# Patient Record
Sex: Female | Born: 1965 | Race: White | Hispanic: No | Smoking: Current every day smoker
Health system: Southern US, Community
[De-identification: ages and names within clinical notes are randomized; demographics above are authoritative.]

## PROBLEM LIST (undated history)

## (undated) ENCOUNTER — Emergency Department (HOSPITAL_COMMUNITY): Disposition: A | Discharge status: Home / Self Care | Payer: No Typology Code available for payment source

## (undated) DIAGNOSIS — K219 Gastro-esophageal reflux disease without esophagitis: Secondary | ICD-10-CM

## (undated) DIAGNOSIS — M549 Dorsalgia, unspecified: Secondary | ICD-10-CM

## (undated) DIAGNOSIS — F32A Depression, unspecified: Secondary | ICD-10-CM

## (undated) DIAGNOSIS — F200 Paranoid schizophrenia: Secondary | ICD-10-CM

## (undated) DIAGNOSIS — F1910 Other psychoactive substance abuse, uncomplicated: Secondary | ICD-10-CM

## (undated) DIAGNOSIS — R011 Cardiac murmur, unspecified: Secondary | ICD-10-CM

## (undated) DIAGNOSIS — N809 Endometriosis, unspecified: Secondary | ICD-10-CM

## (undated) DIAGNOSIS — F419 Anxiety disorder, unspecified: Secondary | ICD-10-CM

## (undated) DIAGNOSIS — F329 Major depressive disorder, single episode, unspecified: Secondary | ICD-10-CM

## (undated) DIAGNOSIS — J45909 Unspecified asthma, uncomplicated: Secondary | ICD-10-CM

## (undated) DIAGNOSIS — F1123 Opioid dependence with withdrawal: Secondary | ICD-10-CM

## (undated) HISTORY — DX: Opioid dependence with withdrawal: F11.23

## (undated) HISTORY — DX: Gastro-esophageal reflux disease without esophagitis: K21.9

## (undated) HISTORY — DX: Cardiac murmur, unspecified: R01.1

## (undated) HISTORY — PX: FOOT SURGERY: SHX648

## (undated) HISTORY — DX: Depression, unspecified: F32.A

## (undated) HISTORY — DX: Other psychoactive substance abuse, uncomplicated: F19.10

## (undated) HISTORY — DX: Major depressive disorder, single episode, unspecified: F32.9

## (undated) HISTORY — DX: Anxiety disorder, unspecified: F41.9

## (undated) SURGERY — Surgical Case
Anesthesia: *Unknown

---

## 1997-07-22 ENCOUNTER — Encounter: Admission: RE | Admit: 1997-07-22 | Discharge: 1997-07-22 | Payer: Self-pay | Admitting: Family Medicine

## 1997-10-23 ENCOUNTER — Emergency Department (HOSPITAL_COMMUNITY): Admission: EM | Admit: 1997-10-23 | Discharge: 1997-10-23 | Payer: Self-pay | Admitting: Emergency Medicine

## 1997-12-05 ENCOUNTER — Emergency Department (HOSPITAL_COMMUNITY): Admission: EM | Admit: 1997-12-05 | Discharge: 1997-12-05 | Payer: Self-pay | Admitting: Emergency Medicine

## 1997-12-15 ENCOUNTER — Inpatient Hospital Stay (HOSPITAL_COMMUNITY): Admission: AD | Admit: 1997-12-15 | Discharge: 1997-12-15 | Payer: Self-pay | Admitting: Obstetrics

## 1998-01-11 ENCOUNTER — Encounter: Admission: RE | Admit: 1998-01-11 | Discharge: 1998-01-11 | Payer: Self-pay | Admitting: Family Medicine

## 1998-01-12 ENCOUNTER — Ambulatory Visit (HOSPITAL_COMMUNITY): Admission: RE | Admit: 1998-01-12 | Discharge: 1998-01-12 | Payer: Self-pay | Admitting: *Deleted

## 1998-03-28 ENCOUNTER — Encounter: Admission: RE | Admit: 1998-03-28 | Discharge: 1998-03-28 | Payer: Self-pay | Admitting: Family Medicine

## 1998-04-02 HISTORY — PX: TUBAL LIGATION: SHX77

## 1998-04-07 ENCOUNTER — Other Ambulatory Visit: Admission: RE | Admit: 1998-04-07 | Discharge: 1998-04-07 | Payer: Self-pay | Admitting: *Deleted

## 1998-04-07 ENCOUNTER — Encounter: Admission: RE | Admit: 1998-04-07 | Discharge: 1998-04-07 | Payer: Self-pay | Admitting: Sports Medicine

## 1998-04-12 ENCOUNTER — Ambulatory Visit (HOSPITAL_COMMUNITY): Admission: RE | Admit: 1998-04-12 | Discharge: 1998-04-12 | Payer: Self-pay

## 1998-04-22 ENCOUNTER — Encounter: Admission: RE | Admit: 1998-04-22 | Discharge: 1998-04-22 | Payer: Self-pay | Admitting: Family Medicine

## 1998-05-10 ENCOUNTER — Encounter: Admission: RE | Admit: 1998-05-10 | Discharge: 1998-05-10 | Payer: Self-pay | Admitting: Sports Medicine

## 1998-06-16 ENCOUNTER — Inpatient Hospital Stay (HOSPITAL_COMMUNITY): Admission: EM | Admit: 1998-06-16 | Discharge: 1998-06-21 | Payer: Self-pay | Admitting: Emergency Medicine

## 1998-06-21 ENCOUNTER — Inpatient Hospital Stay (HOSPITAL_COMMUNITY): Admission: AD | Admit: 1998-06-21 | Discharge: 1998-06-29 | Payer: Self-pay | Admitting: *Deleted

## 1998-06-21 ENCOUNTER — Encounter: Payer: Self-pay | Admitting: *Deleted

## 1999-01-16 ENCOUNTER — Emergency Department (HOSPITAL_COMMUNITY): Admission: EM | Admit: 1999-01-16 | Discharge: 1999-01-16 | Payer: Self-pay | Admitting: Emergency Medicine

## 1999-07-08 ENCOUNTER — Emergency Department (HOSPITAL_COMMUNITY): Admission: EM | Admit: 1999-07-08 | Discharge: 1999-07-08 | Payer: Self-pay | Admitting: Emergency Medicine

## 1999-07-10 ENCOUNTER — Emergency Department (HOSPITAL_COMMUNITY): Admission: EM | Admit: 1999-07-10 | Discharge: 1999-07-10 | Payer: Self-pay | Admitting: Emergency Medicine

## 2001-01-08 ENCOUNTER — Emergency Department (HOSPITAL_COMMUNITY): Admission: EM | Admit: 2001-01-08 | Discharge: 2001-01-08 | Payer: Self-pay | Admitting: Emergency Medicine

## 2001-03-22 ENCOUNTER — Encounter: Payer: Self-pay | Admitting: Emergency Medicine

## 2001-03-22 ENCOUNTER — Emergency Department (HOSPITAL_COMMUNITY): Admission: EM | Admit: 2001-03-22 | Discharge: 2001-03-22 | Payer: Self-pay | Admitting: Emergency Medicine

## 2002-09-03 ENCOUNTER — Emergency Department (HOSPITAL_COMMUNITY): Admission: EM | Admit: 2002-09-03 | Discharge: 2002-09-03 | Payer: Self-pay | Admitting: *Deleted

## 2002-10-27 ENCOUNTER — Emergency Department (HOSPITAL_COMMUNITY): Admission: EM | Admit: 2002-10-27 | Discharge: 2002-10-27 | Payer: Self-pay | Admitting: Emergency Medicine

## 2002-10-28 ENCOUNTER — Emergency Department (HOSPITAL_COMMUNITY): Admission: EM | Admit: 2002-10-28 | Discharge: 2002-10-28 | Payer: Self-pay

## 2002-10-30 ENCOUNTER — Emergency Department (HOSPITAL_COMMUNITY): Admission: EM | Admit: 2002-10-30 | Discharge: 2002-10-30 | Payer: Self-pay | Admitting: Emergency Medicine

## 2002-11-14 ENCOUNTER — Emergency Department (HOSPITAL_COMMUNITY): Admission: EM | Admit: 2002-11-14 | Discharge: 2002-11-14 | Payer: Self-pay | Admitting: Emergency Medicine

## 2003-08-28 ENCOUNTER — Emergency Department (HOSPITAL_COMMUNITY): Admission: EM | Admit: 2003-08-28 | Discharge: 2003-08-28 | Payer: Self-pay | Admitting: Emergency Medicine

## 2003-11-03 ENCOUNTER — Emergency Department (HOSPITAL_COMMUNITY): Admission: EM | Admit: 2003-11-03 | Discharge: 2003-11-03 | Payer: Self-pay | Admitting: Emergency Medicine

## 2003-11-07 ENCOUNTER — Emergency Department (HOSPITAL_COMMUNITY): Admission: EM | Admit: 2003-11-07 | Discharge: 2003-11-07 | Payer: Self-pay | Admitting: Emergency Medicine

## 2004-10-24 ENCOUNTER — Ambulatory Visit: Payer: Self-pay | Admitting: Internal Medicine

## 2004-10-27 ENCOUNTER — Ambulatory Visit: Payer: Self-pay | Admitting: *Deleted

## 2005-02-20 ENCOUNTER — Emergency Department (HOSPITAL_COMMUNITY): Admission: EM | Admit: 2005-02-20 | Discharge: 2005-02-20 | Payer: Self-pay | Admitting: Emergency Medicine
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
## 2006-03-06 ENCOUNTER — Emergency Department (HOSPITAL_COMMUNITY): Admission: EM | Admit: 2006-03-06 | Discharge: 2006-03-06 | Payer: Self-pay | Admitting: Emergency Medicine
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
## 2008-03-04 ENCOUNTER — Emergency Department (HOSPITAL_COMMUNITY): Admission: EM | Admit: 2008-03-04 | Discharge: 2008-03-04 | Payer: Self-pay | Admitting: Emergency Medicine

## 2009-02-22 ENCOUNTER — Inpatient Hospital Stay (HOSPITAL_COMMUNITY): Admission: EM | Admit: 2009-02-22 | Discharge: 2009-02-23 | Payer: Self-pay | Admitting: Emergency Medicine
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
## 2010-01-03 ENCOUNTER — Emergency Department (HOSPITAL_COMMUNITY): Admission: EM | Admit: 2010-01-03 | Discharge: 2010-01-03 | Payer: Self-pay | Admitting: Emergency Medicine

## 2010-06-21 ENCOUNTER — Emergency Department (HOSPITAL_COMMUNITY)
Admission: EM | Admit: 2010-06-21 | Discharge: 2010-06-21 | Disposition: A | Discharge status: Home / Self Care | Payer: Self-pay | Attending: Emergency Medicine | Admitting: Emergency Medicine

## 2010-06-21 DIAGNOSIS — K029 Dental caries, unspecified: Secondary | ICD-10-CM | POA: Insufficient documentation

## 2010-06-21 DIAGNOSIS — K089 Disorder of teeth and supporting structures, unspecified: Secondary | ICD-10-CM | POA: Insufficient documentation

## 2010-07-05 LAB — COMPREHENSIVE METABOLIC PANEL
Albumin: 2.9 g/dL — ABNORMAL LOW (ref 3.5–5.2)
Alkaline Phosphatase: 56 U/L (ref 39–117)
BUN: 3 mg/dL — ABNORMAL LOW (ref 6–23)
BUN: 7 mg/dL (ref 6–23)
CO2: 19 mEq/L (ref 19–32)
Chloride: 110 mEq/L (ref 96–112)
Creatinine, Ser: 0.69 mg/dL (ref 0.4–1.2)
Creatinine, Ser: 0.69 mg/dL (ref 0.4–1.2)
GFR calc non Af Amer: >60 mL/min (ref >60)
GFR calc non Af Amer: >60 mL/min (ref >60)
Glucose, Bld: 86 mg/dL (ref 70–99)
Glucose, Bld: 91 mg/dL (ref 70–99)
Potassium: 3.3 mEq/L — ABNORMAL LOW (ref 3.5–5.1)
Sodium: 137 mEq/L (ref 135–145)
Sodium: 142 mEq/L (ref 135–145)
Total Protein: 5.5 g/dL — ABNORMAL LOW (ref 6.0–8.3)
Total Protein: 6.2 g/dL (ref 6.0–8.3)

## 2010-07-05 LAB — URINALYSIS, ROUTINE W REFLEX MICROSCOPIC
Bilirubin Urine: NEGATIVE
Ketones, ur: NEGATIVE mg/dL
Nitrite: NEGATIVE
Protein, ur: NEGATIVE mg/dL
Specific Gravity, Urine: 1.012 (ref 1.005–1.030)

## 2010-07-05 LAB — URINE MICROSCOPIC-ADD ON

## 2010-07-05 LAB — DIFFERENTIAL
Eosinophils Absolute: 0 10*3/uL (ref 0.0–0.7)
Eosinophils Relative: 0 % (ref 0–5)
Lymphocytes Relative: 27 % (ref 12–46)
Monocytes Absolute: 0.4 10*3/uL (ref 0.1–1.0)
Monocytes Relative: 5 % (ref 3–12)
Neutro Abs: 4.7 10*3/uL (ref 1.7–7.7)

## 2010-07-05 LAB — CALCIUM: Calcium: 7.6 mg/dL — ABNORMAL LOW (ref 8.4–10.5)

## 2010-07-05 LAB — MAGNESIUM
Magnesium: 1.8 mg/dL (ref 1.5–2.5)
Magnesium: 1.8 mg/dL (ref 1.5–2.5)

## 2010-07-05 LAB — RAPID URINE DRUG SCREEN, HOSP PERFORMED
Benzodiazepines: NOT DETECTED
Cocaine: POSITIVE — AB
Tetrahydrocannabinol: NOT DETECTED

## 2010-07-05 LAB — TRICYCLICS SCREEN, URINE: TCA Scrn: POSITIVE — AB

## 2010-07-05 LAB — CBC
HCT: 32.5 % — ABNORMAL LOW (ref 36.0–46.0)
MCHC: 34 g/dL (ref 30.0–36.0)
MCV: 85 fL (ref 78.0–100.0)
MCV: 85.3 fL (ref 78.0–100.0)
Platelets: 186 10*3/uL (ref 150–400)
RBC: 3.82 MIL/uL — ABNORMAL LOW (ref 3.87–5.11)
RDW: 12.8 % (ref 11.5–15.5)
RDW: 13.1 % (ref 11.5–15.5)
WBC: 7 10*3/uL (ref 4.0–10.5)

## 2010-07-05 LAB — ETHANOL: Alcohol, Ethyl (B): 158 mg/dL — ABNORMAL HIGH (ref 0–10)

## 2010-07-05 LAB — SALICYLATE LEVEL: Salicylate Lvl: <4 mg/dL (ref 2.8–20.0)

## 2010-07-05 LAB — PHOSPHORUS: Phosphorus: 2.2 mg/dL — ABNORMAL LOW (ref 2.3–4.6)

## 2010-07-05 LAB — ACETAMINOPHEN LEVEL: Acetaminophen (Tylenol), Serum: <10 ug/mL — ABNORMAL LOW (ref 10–30)

## 2010-10-03 ENCOUNTER — Other Ambulatory Visit (HOSPITAL_COMMUNITY)
Admission: RE | Admit: 2010-10-03 | Discharge: 2010-10-03 | Disposition: A | Discharge status: Home / Self Care | Payer: BC Managed Care – PPO | Source: Ambulatory Visit | Attending: Family Medicine | Admitting: Family Medicine

## 2010-10-03 ENCOUNTER — Ambulatory Visit: Payer: Self-pay | Admitting: Family Medicine

## 2010-10-03 ENCOUNTER — Ambulatory Visit (INDEPENDENT_AMBULATORY_CARE_PROVIDER_SITE_OTHER): Payer: Self-pay | Admitting: Family Medicine

## 2010-10-03 ENCOUNTER — Telehealth: Payer: Self-pay | Admitting: Family Medicine

## 2010-10-03 DIAGNOSIS — F329 Major depressive disorder, single episode, unspecified: Secondary | ICD-10-CM

## 2010-10-03 DIAGNOSIS — F1910 Other psychoactive substance abuse, uncomplicated: Secondary | ICD-10-CM

## 2010-10-03 DIAGNOSIS — Z0000 Encounter for general adult medical examination without abnormal findings: Secondary | ICD-10-CM

## 2010-10-03 DIAGNOSIS — Z01419 Encounter for gynecological examination (general) (routine) without abnormal findings: Secondary | ICD-10-CM | POA: Insufficient documentation

## 2010-10-03 DIAGNOSIS — G4700 Insomnia, unspecified: Secondary | ICD-10-CM

## 2010-10-03 DIAGNOSIS — Z205 Contact with and (suspected) exposure to viral hepatitis: Secondary | ICD-10-CM

## 2010-10-03 DIAGNOSIS — B1920 Unspecified viral hepatitis C without hepatic coma: Secondary | ICD-10-CM

## 2010-10-03 DIAGNOSIS — N760 Acute vaginitis: Secondary | ICD-10-CM

## 2010-10-03 DIAGNOSIS — R5381 Other malaise: Secondary | ICD-10-CM

## 2010-10-03 DIAGNOSIS — F32A Depression, unspecified: Secondary | ICD-10-CM

## 2010-10-03 DIAGNOSIS — Z202 Contact with and (suspected) exposure to infections with a predominantly sexual mode of transmission: Secondary | ICD-10-CM

## 2010-10-03 DIAGNOSIS — Z124 Encounter for screening for malignant neoplasm of cervix: Secondary | ICD-10-CM

## 2010-10-03 DIAGNOSIS — R5383 Other fatigue: Secondary | ICD-10-CM

## 2010-10-03 DIAGNOSIS — Z20828 Contact with and (suspected) exposure to other viral communicable diseases: Secondary | ICD-10-CM

## 2010-10-03 DIAGNOSIS — F1990 Other psychoactive substance use, unspecified, uncomplicated: Secondary | ICD-10-CM

## 2010-10-03 MED ORDER — OMEPRAZOLE 40 MG PO CPDR: 40.0000 mg | DELAYED_RELEASE_CAPSULE | Freq: Every day | ORAL | Status: DC

## 2010-10-03 MED ORDER — ZOLPIDEM TARTRATE ER 6.25 MG PO TBCR: 6.2500 mg | EXTENDED_RELEASE_TABLET | Freq: Every evening | ORAL | Status: DC | PRN

## 2010-10-03 MED ORDER — SERTRALINE HCL 50 MG PO TABS: 50.0000 mg | ORAL_TABLET | Freq: Every day | ORAL | Status: DC

## 2010-10-03 NOTE — Telephone Encounter (Signed)
Pt states that she cannot afford the ambien.  Not even the generic.  Wants to know if there is something cheaper. [facility] [address]

## 2010-10-03 NOTE — Patient Instructions (Signed)
We are going to check you for thyroid problems, anemia, Hepatitis, and the major sexually transmitted diseases.  It will take several days to come back and we'll call or send a letter depending on what it shows. In the meantime, I will prescribe Omeprazole as an anti-acid medicine.  It is stronger than Tums and can help with the nausea, burning, and indigestion.  It takes several days to kick in. I will do 2 weeks of Ambien as well as the mood medicine to stabilize things.  This should help some with sleep.  We'll also see what your lab tests show as far as sleep. Come back and see me in 4-6 weeks.

## 2010-10-04 LAB — TSH: TSH: 1.588 u[IU]/mL (ref 0.350–4.500)

## 2010-10-04 LAB — CBC
Hemoglobin: 14.1 g/dL (ref 12.0–15.0)
MCHC: 33.7 g/dL (ref 30.0–36.0)
WBC: 7 10*3/uL (ref 4.0–10.5)

## 2010-10-04 LAB — COMPREHENSIVE METABOLIC PANEL
AST: 14 U/L (ref 0–37)
Albumin: 4.4 g/dL (ref 3.5–5.2)
BUN: 4 mg/dL — ABNORMAL LOW (ref 6–23)
CO2: 24 mEq/L (ref 19–32)
Calcium: 10 mg/dL (ref 8.4–10.5)
Chloride: 102 mEq/L (ref 96–112)
Glucose, Bld: 81 mg/dL (ref 70–99)
Potassium: 3.8 mEq/L (ref 3.5–5.3)

## 2010-10-04 LAB — GC/CHLAMYDIA PROBE AMP, GENITAL
Chlamydia, DNA Probe: NEGATIVE
GC Probe Amp, Genital: NEGATIVE

## 2010-10-04 LAB — LIPID PANEL
LDL Cholesterol: 135 mg/dL — ABNORMAL HIGH (ref 0–99)
Triglycerides: 152 mg/dL — ABNORMAL HIGH (ref <150)

## 2010-10-04 LAB — HIV ANTIBODY (ROUTINE TESTING W REFLEX): HIV: NONREACTIVE

## 2010-10-05 ENCOUNTER — Telehealth: Payer: Self-pay | Admitting: Family Medicine

## 2010-10-05 LAB — HEPATITIS PANEL, ACUTE: Hep A IgM: NEGATIVE

## 2010-10-05 NOTE — Telephone Encounter (Signed)
Pt was prescribed ambien CR, says it cost her too much and wants to know if MD can lower the dosage so she can get it cheaper?

## 2010-10-05 NOTE — Telephone Encounter (Signed)
Pt called back, says her husband is going to just pay for the rx for her.

## 2010-10-06 ENCOUNTER — Encounter: Payer: Self-pay | Admitting: Family Medicine

## 2010-10-06 DIAGNOSIS — G4700 Insomnia, unspecified: Secondary | ICD-10-CM | POA: Insufficient documentation

## 2010-10-06 DIAGNOSIS — Z205 Contact with and (suspected) exposure to viral hepatitis: Secondary | ICD-10-CM | POA: Insufficient documentation

## 2010-10-06 DIAGNOSIS — F32A Depression, unspecified: Secondary | ICD-10-CM | POA: Insufficient documentation

## 2010-10-06 DIAGNOSIS — F329 Major depressive disorder, single episode, unspecified: Secondary | ICD-10-CM | POA: Insufficient documentation

## 2010-10-06 MED ORDER — DOXEPIN HCL 6 MG PO TABS: ORAL_TABLET | ORAL | Status: DC

## 2010-10-06 NOTE — Assessment & Plan Note (Signed)
Plan to start her on Sertraline as this has helped her in past.   May need increase in future.   Fu in 3 weeks

## 2010-10-06 NOTE — Telephone Encounter (Signed)
I sent in for Doxepin for her, this is a much cheaper sleep medication.
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
## 2010-10-06 NOTE — Telephone Encounter (Signed)
Informed patient

## 2010-10-06 NOTE — Progress Notes (Signed)
Addended by: Sycamore Cellar on: 10/06/2010 03:04 PM   Modules accepted: Orders

## 2010-10-06 NOTE — Assessment & Plan Note (Signed)
Will recheck today.   If positive, recommend her to be seen at Hep C clinic.

## 2010-10-06 NOTE — Assessment & Plan Note (Addendum)
Believe that Sertraline will help with this as anxiety has been a large component.   Will prescribe 2 weeks of Ambien.    ** Addendum:  Ambien too expensive, will attempt Doxepin.

## 2010-10-14 ENCOUNTER — Other Ambulatory Visit: Payer: Self-pay | Admitting: Family Medicine

## 2010-10-14 DIAGNOSIS — G4700 Insomnia, unspecified: Secondary | ICD-10-CM

## 2010-10-18 ENCOUNTER — Telehealth: Payer: Self-pay | Admitting: Family Medicine

## 2010-10-18 ENCOUNTER — Emergency Department (HOSPITAL_COMMUNITY)
Admission: EM | Admit: 2010-10-18 | Discharge: 2010-10-18 | Disposition: A | Discharge status: Home / Self Care | Payer: BC Managed Care – PPO | Attending: Emergency Medicine | Admitting: Emergency Medicine

## 2010-10-18 DIAGNOSIS — K029 Dental caries, unspecified: Secondary | ICD-10-CM | POA: Insufficient documentation

## 2010-10-18 DIAGNOSIS — K089 Disorder of teeth and supporting structures, unspecified: Secondary | ICD-10-CM | POA: Insufficient documentation

## 2010-10-18 NOTE — Telephone Encounter (Signed)
Spoke with patient and she states she has found out that her insurance will pay for Ambien. She has called the insurance company and found out  she first had to pay a deductable of $55.00 but then all the RX will be $5.00. States the doxepin does not help . She wakes up several times during night and the next day she feels tired. Will send message to MD. She needs RX before Friday.    States she just picked up Rx for doxepin on Monday.

## 2010-10-18 NOTE — Telephone Encounter (Signed)
Pt is having a hard time with getting her Remus Loffler  - needs to talk to nurse

## 2010-10-19 ENCOUNTER — Other Ambulatory Visit: Payer: Self-pay | Admitting: Family Medicine

## 2010-10-19 DIAGNOSIS — R768 Other specified abnormal immunological findings in serum: Secondary | ICD-10-CM

## 2010-10-19 NOTE — Telephone Encounter (Signed)
Faxed Prescription for Ambien this morning.

## 2010-10-19 NOTE — Telephone Encounter (Signed)
Patient notified

## 2010-11-01 ENCOUNTER — Ambulatory Visit (INDEPENDENT_AMBULATORY_CARE_PROVIDER_SITE_OTHER): Payer: BC Managed Care – PPO | Admitting: Family Medicine

## 2010-11-01 ENCOUNTER — Ambulatory Visit (HOSPITAL_COMMUNITY)
Admission: RE | Admit: 2010-11-01 | Discharge: 2010-11-01 | Disposition: A | Discharge status: Home / Self Care | Payer: BC Managed Care – PPO | Source: Ambulatory Visit | Attending: Family Medicine | Admitting: Family Medicine

## 2010-11-01 ENCOUNTER — Encounter: Payer: Self-pay | Admitting: Family Medicine

## 2010-11-01 DIAGNOSIS — G8911 Acute pain due to trauma: Secondary | ICD-10-CM

## 2010-11-01 DIAGNOSIS — M545 Low back pain, unspecified: Secondary | ICD-10-CM | POA: Insufficient documentation

## 2010-11-01 DIAGNOSIS — IMO0001 Reserved for inherently not codable concepts without codable children: Secondary | ICD-10-CM | POA: Insufficient documentation
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
## 2010-11-01 MED ORDER — HYDROCODONE-ACETAMINOPHEN 5-500 MG PO TABS: 1.0000 | ORAL_TABLET | Freq: Four times a day (QID) | ORAL | Status: AC | PRN
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
## 2010-11-03 ENCOUNTER — Telehealth: Payer: Self-pay | Admitting: Family Medicine

## 2010-11-03 NOTE — Telephone Encounter (Signed)
[facility] has sent refill request for Zolpidem and Doxepin.  Just wanted to let you know to watch for them.  She said one of the meds had a refill, but it was too soon so they need authorization.

## 2010-11-03 NOTE — Telephone Encounter (Signed)
Will refill once I have the paperwork.
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
## 2010-11-04 NOTE — Assessment & Plan Note (Signed)
Plan to treat patient's pain.   Obtain lumbar films for documentation purposes and to assure no fractures. Possible fracture of coccyx:  Provided pain meds and recommended doughnut cushion. Ensured again that patient has safe place to be and safety plan in place.  Patient reiterates that this is the case.   To FU in 1-2 weeks or sooner to make sure she's still safe and doing well.    ** Will need to upload photos to record.

## 2010-11-19 ENCOUNTER — Emergency Department (HOSPITAL_COMMUNITY)
Admission: EM | Admit: 2010-11-19 | Discharge: 2010-11-19 | Disposition: A | Discharge status: Home / Self Care | Payer: BC Managed Care – PPO | Attending: Emergency Medicine | Admitting: Emergency Medicine

## 2010-11-19 DIAGNOSIS — Z79899 Other long term (current) drug therapy: Secondary | ICD-10-CM | POA: Insufficient documentation

## 2010-11-19 DIAGNOSIS — N946 Dysmenorrhea, unspecified: Secondary | ICD-10-CM | POA: Insufficient documentation

## 2010-11-19 DIAGNOSIS — K029 Dental caries, unspecified: Secondary | ICD-10-CM | POA: Insufficient documentation

## 2010-11-19 DIAGNOSIS — K089 Disorder of teeth and supporting structures, unspecified: Secondary | ICD-10-CM | POA: Insufficient documentation

## 2010-11-19 LAB — URINALYSIS, ROUTINE W REFLEX MICROSCOPIC
Leukocytes, UA: NEGATIVE
Nitrite: NEGATIVE
Specific Gravity, Urine: 1.011 (ref 1.005–1.030)
pH: 6.5 (ref 5.0–8.0)

## 2010-11-19 LAB — URINE MICROSCOPIC-ADD ON
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
## 2011-01-18 ENCOUNTER — Ambulatory Visit: Payer: BC Managed Care – PPO | Admitting: Gastroenterology

## 2011-01-19 ENCOUNTER — Ambulatory Visit: Payer: BC Managed Care – PPO | Admitting: Gastroenterology

## 2011-01-19 ENCOUNTER — Telehealth: Payer: Self-pay | Admitting: Family Medicine

## 2011-01-19 NOTE — Telephone Encounter (Signed)
Need to have pictures taken of patient returned to her to be used in court on 10/31st.  Please call her back if there is a problem with t his request

## 2011-01-23 NOTE — Telephone Encounter (Signed)
These pictures are included in her patient visit on the computer in Epic. Do we know how to get her these pictures?  There are no hard copies, they were taken on a digital camera.

## 2011-01-30 ENCOUNTER — Telehealth: Payer: Self-pay | Admitting: Family Medicine

## 2011-01-30 NOTE — Telephone Encounter (Signed)
States that the pharmacy doesn't have the 6.25 Ambien and needs something to help her sleep - they told her they have a higher dose but not the 6.25 [facility] [address]

## 2011-01-31 MED ORDER — ZOLPIDEM TARTRATE 10 MG PO TABS: 10.0000 mg | ORAL_TABLET | Freq: Every evening | ORAL | Status: DC | PRN

## 2011-01-31 NOTE — Telephone Encounter (Signed)
Forwarded to pcp.Isahia Hollerbach Lynetta  

## 2011-02-01 ENCOUNTER — Ambulatory Visit: Payer: BC Managed Care – PPO | Admitting: Family Medicine

## 2011-02-04 ENCOUNTER — Encounter (HOSPITAL_COMMUNITY): Payer: Self-pay | Admitting: Emergency Medicine

## 2011-02-04 ENCOUNTER — Emergency Department (HOSPITAL_COMMUNITY)
Admission: EM | Admit: 2011-02-04 | Discharge: 2011-02-04 | Disposition: A | Discharge status: Home / Self Care | Payer: BC Managed Care – PPO | Attending: Emergency Medicine | Admitting: Emergency Medicine

## 2011-02-04 ENCOUNTER — Emergency Department (HOSPITAL_COMMUNITY): Payer: BC Managed Care – PPO

## 2011-02-04 DIAGNOSIS — J329 Chronic sinusitis, unspecified: Secondary | ICD-10-CM | POA: Insufficient documentation

## 2011-02-04 DIAGNOSIS — J069 Acute upper respiratory infection, unspecified: Secondary | ICD-10-CM | POA: Insufficient documentation

## 2011-02-04 DIAGNOSIS — R059 Cough, unspecified: Secondary | ICD-10-CM | POA: Insufficient documentation

## 2011-02-04 DIAGNOSIS — R05 Cough: Secondary | ICD-10-CM | POA: Insufficient documentation

## 2011-02-04 DIAGNOSIS — R51 Headache: Secondary | ICD-10-CM | POA: Insufficient documentation

## 2011-02-04 DIAGNOSIS — R070 Pain in throat: Secondary | ICD-10-CM | POA: Insufficient documentation

## 2011-02-04 LAB — BASIC METABOLIC PANEL
CO2: 26 mEq/L (ref 19–32)
Calcium: 9.7 mg/dL (ref 8.4–10.5)
GFR calc non Af Amer: >90 mL/min (ref >90)
Sodium: 137 mEq/L (ref 135–145)

## 2011-02-04 LAB — DIFFERENTIAL
Basophils Absolute: 0 10*3/uL (ref 0.0–0.1)
Eosinophils Relative: 1 % (ref 0–5)
Lymphocytes Relative: 29 % (ref 12–46)

## 2011-02-04 LAB — CBC
MCV: 87.5 fL (ref 78.0–100.0)
Platelets: 200 10*3/uL (ref 150–400)
RDW: 13.3 % (ref 11.5–15.5)
WBC: 7.4 10*3/uL (ref 4.0–10.5)
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
## 2011-02-04 MED ORDER — ACETAMINOPHEN 325 MG PO TABS
650.0000 mg | ORAL_TABLET | Freq: Once | ORAL | Status: AC
  Administered 2011-02-04: 650 mg via ORAL
  Filled 2011-02-04: qty 2

## 2011-02-04 MED ORDER — OXYCODONE-ACETAMINOPHEN 5-325 MG PO TABS: 2.0000 | ORAL_TABLET | ORAL | Status: AC | PRN

## 2011-02-04 MED ORDER — SODIUM CHLORIDE 0.9 % IV BOLUS (SEPSIS)
1000.0000 mL | Freq: Once | INTRAVENOUS | Status: AC
  Administered 2011-02-04: 1000 mL via INTRAVENOUS

## 2011-02-04 MED ORDER — OXYCODONE-ACETAMINOPHEN 5-325 MG PO TABS
1.0000 | ORAL_TABLET | Freq: Once | ORAL | Status: AC
  Administered 2011-02-04: 1 via ORAL
  Filled 2011-02-04: qty 1

## 2011-02-04 MED ORDER — AMOXICILLIN-POT CLAVULANATE 500-125 MG PO TABS: 1.0000 | ORAL_TABLET | Freq: Three times a day (TID) | ORAL | Status: DC

## 2011-02-04 NOTE — ED Provider Notes (Signed)
History     CSN: [id number] Arrival date & time: 02/04/2011  9:25 AM   First MD Initiated Contact with Patient 02/04/11 1009      Chief Complaint  Patient presents with  . Headache  . Sore Throat  . Cough    (Consider location/radiation/quality/duration/timing/severity/associated sxs/prior treatment) Patient is a 45 y.o. female presenting with sinusitis.  Sinusitis   pt presnted to the emergency department with complaints of headache, runny nose, and cough for several days. She states she feels positive for fever by touch, denies weakness, urinary complaints, neck stiffness, N/V/D.  Past Medical History  Diagnosis Date  . Anxiety   . Depression   . Substance abuse Clean as of 2009    Crack cocaine    Past Surgical History  Procedure Date  . Cesarean section     No family history on file.  History  Substance Use Topics  . Smoking status: Current Some Day Smoker -- 0.5 packs/day    Types: Cigarettes  . Smokeless tobacco: Not on file  . Alcohol Use: Not on file    OB History    Grav Para Term Preterm Abortions TAB SAB Ect Mult Living                  Review of Systems  All other systems reviewed and are negative.    Allergies  Review of patient's allergies indicates no known allergies.  Home Medications   Current Outpatient Rx  Name Route Sig Dispense Refill  . DOXEPIN HCL 6 MG PO TABS  Take 1 tab po q HS prn anxiety 30 tablet 0  . OVER THE COUNTER MEDICATION Oral Take 1.5 tablets by mouth daily. Patients bottle said Mucus Relief DM.     Marland Kitchen PSEUDO PO Oral Take 2 tablets by mouth every 6 (six) hours as needed. For nasal congestion.     Ronney Asters COLD SEVERE CONGESTION PO Oral Take 2 tablets by mouth every 4 (four) hours as needed. Cold symptoms     . ACETAMINOPHEN-CODEINE #3 300-30 MG PO TABS      . HYDROCODONE-ACETAMINOPHEN 5-500 MG PO TABS      . OMEPRAZOLE 40 MG PO CPDR Oral Take 1 capsule (40 mg total) by mouth daily. 30 capsule 1  . PENICILLIN V  POTASSIUM 500 MG PO TABS      . SERTRALINE HCL 50 MG PO TABS Oral Take 1 tablet (50 mg total) by mouth daily. 30 tablet 1  . ZOLPIDEM TARTRATE 10 MG PO TABS Oral Take 1 tablet (10 mg total) by mouth at bedtime as needed for sleep. 15 tablet 0    BP 125/76  Pulse 89  Temp(Src) 98.3 F (36.8 C) (Oral)  SpO2 100%  LMP 01/07/2011  Physical Exam  Constitutional: Vital signs are normal. She appears well-developed and well-nourished.  HENT:  Head: Normocephalic and atraumatic.  Right Ear: External ear and ear canal normal.  Left Ear: External ear and ear canal normal.  Nose: Rhinorrhea present. Right sinus exhibits maxillary sinus tenderness and frontal sinus tenderness. Left sinus exhibits maxillary sinus tenderness and frontal sinus tenderness.  Eyes: Conjunctivae and EOM are normal. Pupils are equal, round, and reactive to light.  Neck: Trachea normal, normal range of motion and full passive range of motion without pain. Neck supple.  Cardiovascular: Normal rate, regular rhythm and normal pulses.   Pulmonary/Chest: Effort normal and breath sounds normal. Chest wall is not dull to percussion. She exhibits no tenderness, no crepitus, no edema,  no deformity and no retraction.  Abdominal: Soft. Normal appearance and bowel sounds are normal.  Musculoskeletal: Normal range of motion.  Lymphadenopathy:       Head (right side): No submental, no submandibular, no tonsillar, no preauricular, no posterior auricular and no occipital adenopathy present.       Head (left side): No submental, no submandibular, no tonsillar, no preauricular, no posterior auricular and no occipital adenopathy present.    She has no cervical adenopathy.    She has no axillary adenopathy.  Neurological: She is alert. She has normal strength.  Skin: Skin is warm, dry and intact.  Psychiatric: She has a normal mood and affect. Her speech is normal and behavior is normal. Judgment and thought content normal. Cognition and  memory are normal.    ED Course  Procedures (including critical care time)   Labs Reviewed  CBC  DIFFERENTIAL  BASIC METABOLIC PANEL  URINALYSIS, ROUTINE W REFLEX MICROSCOPIC  PREGNANCY, URINE   No results found.  Results for orders placed during the hospital encounter of 02/04/11  CBC      Component Value Range   WBC 7.4  4.0 - 10.5 (K/uL)   RBC 4.73  3.87 - 5.11 (MIL/uL)   Hemoglobin 14.0  12.0 - 15.0 (g/dL)   HCT 40.9  81.1 - 91.4 (%)   MCV 87.5  78.0 - 100.0 (fL)   MCH 29.6  26.0 - 34.0 (pg)   MCHC 33.8  30.0 - 36.0 (g/dL)   RDW 78.2  95.6 - 21.3 (%)   Platelets 200  150 - 400 (K/uL)  DIFFERENTIAL      Component Value Range   Neutrophils Relative 61  43 - 77 (%)   Neutro Abs 4.5  1.7 - 7.7 (K/uL)   Lymphocytes Relative 29  12 - 46 (%)   Lymphs Abs 2.1  0.7 - 4.0 (K/uL)   Monocytes Relative 9  3 - 12 (%)   Monocytes Absolute 0.7  0.1 - 1.0 (K/uL)   Eosinophils Relative 1  0 - 5 (%)   Eosinophils Absolute 0.1  0.0 - 0.7 (K/uL)   Basophils Relative 0  0 - 1 (%)   Basophils Absolute 0.0  0.0 - 0.1 (K/uL)  BASIC METABOLIC PANEL      Component Value Range   Sodium 137  135 - 145 (mEq/L)   Potassium 3.6  3.5 - 5.1 (mEq/L)   Chloride 101  96 - 112 (mEq/L)   CO2 26  19 - 32 (mEq/L)   Glucose, Bld 99  70 - 99 (mg/dL)   BUN 5 (*) 6 - 23 (mg/dL)   Creatinine, Ser 0.86  0.50 - 1.10 (mg/dL)   Calcium 9.7  8.4 - 57.8 (mg/dL)   GFR calc non Af Amer >90  >90 (mL/min)   GFR calc Af Amer >90  >90 (mL/min)   Dg Chest 2 View  02/04/2011  *RADIOLOGY REPORT*  Clinical Data: Cough, sore throat, congestion.  CHEST - 2 VIEW  Comparison: 02/22/2009  Findings: Heart and mediastinal contours are within normal limits. No focal opacities or effusions.  No acute bony abnormality.  IMPRESSION: No active disease.  Original Report Authenticated By: Cyndie Chime, M.D.    No diagnosis found.    MDM  pts work-up is negative. No meningeal signs or signs of pneumonia.       Dorthula Matas, PA 02/10/11 [phone]

## 2011-02-04 NOTE — ED Notes (Signed)
Pt reports headache, cough, sore throat onset Thursday.

## 2011-02-04 NOTE — ED Notes (Signed)
Patient is resting comfortably.  Ambulatory to bathroom

## 2011-02-04 NOTE — ED Notes (Signed)
Pt to radiology.

## 2011-02-07 ENCOUNTER — Encounter: Payer: Self-pay | Admitting: Family Medicine

## 2011-02-07 ENCOUNTER — Telehealth: Payer: Self-pay | Admitting: Family Medicine

## 2011-02-07 ENCOUNTER — Ambulatory Visit (INDEPENDENT_AMBULATORY_CARE_PROVIDER_SITE_OTHER): Payer: BC Managed Care – PPO | Admitting: Family Medicine

## 2011-02-07 VITALS — BP 114/86 | HR 75 | Temp 97.9°F | Ht 63.0 in | Wt 138.7 lb

## 2011-02-07 DIAGNOSIS — N760 Acute vaginitis: Secondary | ICD-10-CM

## 2011-02-07 DIAGNOSIS — G4700 Insomnia, unspecified: Secondary | ICD-10-CM

## 2011-02-07 DIAGNOSIS — Z20828 Contact with and (suspected) exposure to other viral communicable diseases: Secondary | ICD-10-CM

## 2011-02-07 DIAGNOSIS — F329 Major depressive disorder, single episode, unspecified: Secondary | ICD-10-CM

## 2011-02-07 DIAGNOSIS — Z205 Contact with and (suspected) exposure to viral hepatitis: Secondary | ICD-10-CM

## 2011-02-07 MED ORDER — AMITRIPTYLINE HCL 50 MG PO TABS: 50.0000 mg | ORAL_TABLET | Freq: Every day | ORAL | Status: DC

## 2011-02-07 MED ORDER — ZOLPIDEM TARTRATE 10 MG PO TABS: 10.0000 mg | ORAL_TABLET | Freq: Every evening | ORAL | Status: DC | PRN

## 2011-02-07 MED ORDER — SERTRALINE HCL 50 MG PO TABS: 50.0000 mg | ORAL_TABLET | Freq: Every day | ORAL | Status: DC

## 2011-02-07 NOTE — Telephone Encounter (Signed)
Pt is asking about her meds that was supposed to be called in. For Amitryptiline and Zoloft. [facility]

## 2011-02-07 NOTE — Patient Instructions (Signed)
Take the Amitryptiline and Zoloft daily. Come back and see me in 10 - 14 days.

## 2011-02-08 NOTE — Assessment & Plan Note (Signed)
Restarted Sertraline, added elavil as this has helped in past. FU in 2 weeks.   This was stabilization visit to help decrease her anxiety in next 2 weeks.   Again, told me she feels safe.  Will revisit long-term plans for depression and life at next visit.
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
## 2011-02-08 NOTE — ED Provider Notes (Signed)
Medical screening examination/treatment/procedure(s) were performed by non-physician practitioner and as supervising physician I was immediately available for consultation/collaboration.  Nicholes Stairs, MD 02/08/11 [phone]

## 2011-02-08 NOTE — Telephone Encounter (Signed)
Refilled yesterday evening at end of clinic.
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
## 2011-02-08 NOTE — Assessment & Plan Note (Signed)
2 weeks of Ambien. Elavil to help with this as well

## 2011-02-12 NOTE — ED Provider Notes (Signed)
Medical screening examination/treatment/procedure(s) were performed by non-physician practitioner and as supervising physician I was immediately available for consultation/collaboration.  Nicholes Stairs, MD 02/12/11 1512

## 2011-02-19 ENCOUNTER — Encounter: Payer: Self-pay | Admitting: Family Medicine

## 2011-02-19 ENCOUNTER — Ambulatory Visit (INDEPENDENT_AMBULATORY_CARE_PROVIDER_SITE_OTHER): Payer: BC Managed Care – PPO | Admitting: Family Medicine

## 2011-02-19 DIAGNOSIS — G4700 Insomnia, unspecified: Secondary | ICD-10-CM

## 2011-02-19 DIAGNOSIS — Z720 Tobacco use: Secondary | ICD-10-CM

## 2011-02-19 DIAGNOSIS — K219 Gastro-esophageal reflux disease without esophagitis: Secondary | ICD-10-CM

## 2011-02-19 DIAGNOSIS — F17200 Nicotine dependence, unspecified, uncomplicated: Secondary | ICD-10-CM

## 2011-02-19 DIAGNOSIS — F329 Major depressive disorder, single episode, unspecified: Secondary | ICD-10-CM

## 2011-02-19 DIAGNOSIS — J45909 Unspecified asthma, uncomplicated: Secondary | ICD-10-CM | POA: Insufficient documentation

## 2011-02-19 MED ORDER — ZOLPIDEM TARTRATE 10 MG PO TABS: 10.0000 mg | ORAL_TABLET | Freq: Every evening | ORAL | Status: DC | PRN

## 2011-02-19 MED ORDER — ESOMEPRAZOLE MAGNESIUM 20 MG PO CPDR: 20.0000 mg | DELAYED_RELEASE_CAPSULE | Freq: Every day | ORAL | Status: DC

## 2011-02-19 MED ORDER — ALBUTEROL SULFATE HFA 108 (90 BASE) MCG/ACT IN AERS: 2.0000 | INHALATION_SPRAY | Freq: Four times a day (QID) | RESPIRATORY_TRACT | Status: DC | PRN

## 2011-02-19 MED ORDER — AMITRIPTYLINE HCL 75 MG PO TABS: 75.0000 mg | ORAL_TABLET | Freq: Every day | ORAL | Status: DC

## 2011-02-19 NOTE — Assessment & Plan Note (Signed)
Counseled to quit 

## 2011-02-19 NOTE — Patient Instructions (Signed)
Take 1 1/2 Elavil (75 mg) until you are out.  Then pick up the next prescription.  We will try an inhaler for your lungs.  Try the Nexium for your reflux.  Hopefully these 2 things together will clear up your cough.  Come back and see me in about 2 months or sooner if you need to.

## 2011-02-19 NOTE — Assessment & Plan Note (Signed)
Much improved.  Continue combination Elavil/zoloft.  May eventually need to increase Zoloft.  See insomnia

## 2011-02-19 NOTE — Assessment & Plan Note (Signed)
Question if this is contributing to cough.   Trial of albuterol, will refer to Pharm clinic for PFT testing. COPD might also be contributing, though she is somewhat young.  Long term smoker.
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
## 2011-02-19 NOTE — Assessment & Plan Note (Signed)
Increase Elavil to 75 mg. Gradually wean zolpidem, which patient has started herself.  Refill for next several months to help.   May eventually need to be on prior 100 mg dose.

## 2011-02-19 NOTE — Assessment & Plan Note (Signed)
Wonder if this is contributing to cough, especially as worse at night when recumbent.  Plan to treat with Nexium to assess for relief.  FU in 1-2 months.   Counseled on smoking cessation to help with this.

## 2011-03-20 ENCOUNTER — Other Ambulatory Visit: Payer: Self-pay | Admitting: Family Medicine

## 2011-03-20 NOTE — Telephone Encounter (Signed)
Refill request

## 2011-04-02 ENCOUNTER — Emergency Department (HOSPITAL_COMMUNITY)
Admission: EM | Admit: 2011-04-02 | Discharge: 2011-04-02 | Disposition: A | Discharge status: Home / Self Care | Payer: BC Managed Care – PPO | Attending: Emergency Medicine | Admitting: Emergency Medicine

## 2011-04-02 DIAGNOSIS — K219 Gastro-esophageal reflux disease without esophagitis: Secondary | ICD-10-CM | POA: Insufficient documentation

## 2011-04-02 DIAGNOSIS — K029 Dental caries, unspecified: Secondary | ICD-10-CM | POA: Insufficient documentation

## 2011-04-02 DIAGNOSIS — K0889 Other specified disorders of teeth and supporting structures: Secondary | ICD-10-CM

## 2011-04-02 DIAGNOSIS — K089 Disorder of teeth and supporting structures, unspecified: Secondary | ICD-10-CM | POA: Insufficient documentation

## 2011-04-02 MED ORDER — HYDROCODONE-IBUPROFEN 7.5-200 MG PO TABS: 1.0000 | ORAL_TABLET | Freq: Four times a day (QID) | ORAL | Status: AC | PRN

## 2011-04-02 MED ORDER — IBUPROFEN 800 MG PO TABS
800.0000 mg | ORAL_TABLET | Freq: Once | ORAL | Status: AC
Start: 2011-04-02 — End: 2011-04-02
  Administered 2011-04-02: 800 mg via ORAL
  Filled 2011-04-02: qty 1

## 2011-04-02 MED ORDER — OXYCODONE HCL 5 MG PO TABS
5.0000 mg | ORAL_TABLET | Freq: Once | ORAL | Status: AC
  Administered 2011-04-02: 5 mg via ORAL
  Filled 2011-04-02: qty 1
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
## 2011-04-02 NOTE — ED Provider Notes (Signed)
Medical screening examination/treatment/procedure(s) were performed by non-physician practitioner and as supervising physician I was immediately available for consultation/collaboration.   Dominiqua Cooner, MD 04/02/11 2337 

## 2011-04-02 NOTE — ED Notes (Signed)
Pt complains of severe toothache, sts was supposed to have oral surgery for removal but missed due to incarceration. Needs pain  Meds.

## 2011-04-04 ENCOUNTER — Telehealth: Payer: Self-pay | Admitting: Family Medicine

## 2011-04-04 NOTE — Telephone Encounter (Signed)
Linda Cervantes is calling for a refill for Ambien.  She says her pharmacy sent over the refill request last week but has not heard back anything.

## 2011-04-04 NOTE — Telephone Encounter (Signed)
Patient called back and has gotten her medication.

## 2011-04-23 ENCOUNTER — Ambulatory Visit (INDEPENDENT_AMBULATORY_CARE_PROVIDER_SITE_OTHER): Payer: BC Managed Care – PPO | Admitting: Family Medicine

## 2011-04-23 ENCOUNTER — Encounter: Payer: Self-pay | Admitting: Family Medicine

## 2011-04-23 DIAGNOSIS — R109 Unspecified abdominal pain: Secondary | ICD-10-CM

## 2011-04-23 DIAGNOSIS — F329 Major depressive disorder, single episode, unspecified: Secondary | ICD-10-CM

## 2011-04-23 DIAGNOSIS — N949 Unspecified condition associated with female genital organs and menstrual cycle: Secondary | ICD-10-CM

## 2011-04-23 DIAGNOSIS — N760 Acute vaginitis: Secondary | ICD-10-CM

## 2011-04-23 DIAGNOSIS — R102 Pelvic and perineal pain: Secondary | ICD-10-CM

## 2011-04-23 LAB — POCT UA - MICROSCOPIC ONLY: Casts, Ur, LPF, POC: 1

## 2011-04-23 LAB — POCT URINALYSIS DIPSTICK
Ketones, UA: NEGATIVE
Leukocytes, UA: NEGATIVE
Nitrite, UA: NEGATIVE
Protein, UA: NEGATIVE
Urobilinogen, UA: 0.2

## 2011-04-23 LAB — POCT WET PREP (WET MOUNT)

## 2011-04-23 MED ORDER — HYDROCODONE-ACETAMINOPHEN 5-325 MG PO TABS: 1.0000 | ORAL_TABLET | Freq: Four times a day (QID) | ORAL | Status: AC | PRN

## 2011-04-23 MED ORDER — CEFTRIAXONE SODIUM 1 G IJ SOLR
1.0000 g | Freq: Once | INTRAMUSCULAR | Status: AC
  Administered 2011-04-23: 1 g via INTRAMUSCULAR

## 2011-04-23 MED ORDER — AZITHROMYCIN 1 G PO PACK
1.0000 g | PACK | Freq: Once | ORAL | Status: AC
  Administered 2011-04-23: 1 g via ORAL

## 2011-04-23 MED ORDER — ZOLPIDEM TARTRATE 10 MG PO TABS: 10.0000 mg | ORAL_TABLET | Freq: Every evening | ORAL | Status: DC | PRN

## 2011-04-23 MED ORDER — TRAZODONE HCL 50 MG PO TABS: 100.0000 mg | ORAL_TABLET | Freq: Every evening | ORAL | Status: AC | PRN

## 2011-04-23 NOTE — Patient Instructions (Signed)
On your way out make an appt with [facility].  Use the Hydrocodone for pain.   Try the Trazodone for sleep. I will call you with results.

## 2011-04-24 ENCOUNTER — Other Ambulatory Visit: Payer: Self-pay | Admitting: Family Medicine

## 2011-04-24 ENCOUNTER — Telehealth: Payer: Self-pay | Admitting: Psychology

## 2011-04-24 DIAGNOSIS — R102 Pelvic and perineal pain: Secondary | ICD-10-CM | POA: Insufficient documentation

## 2011-04-24 LAB — GC/CHLAMYDIA PROBE AMP, GENITAL: GC Probe Amp, Genital: NEGATIVE

## 2011-04-24 IMAGING — CR DG SACRUM/COCCYX 2+V
3 series · 3 of 3 positions shown · non-contrast
Comparison: No priors.

CLINICAL DATA: Back pain.

SACRUM AND COCCYX - 2+ VIEW

[t sacrum a.p.]
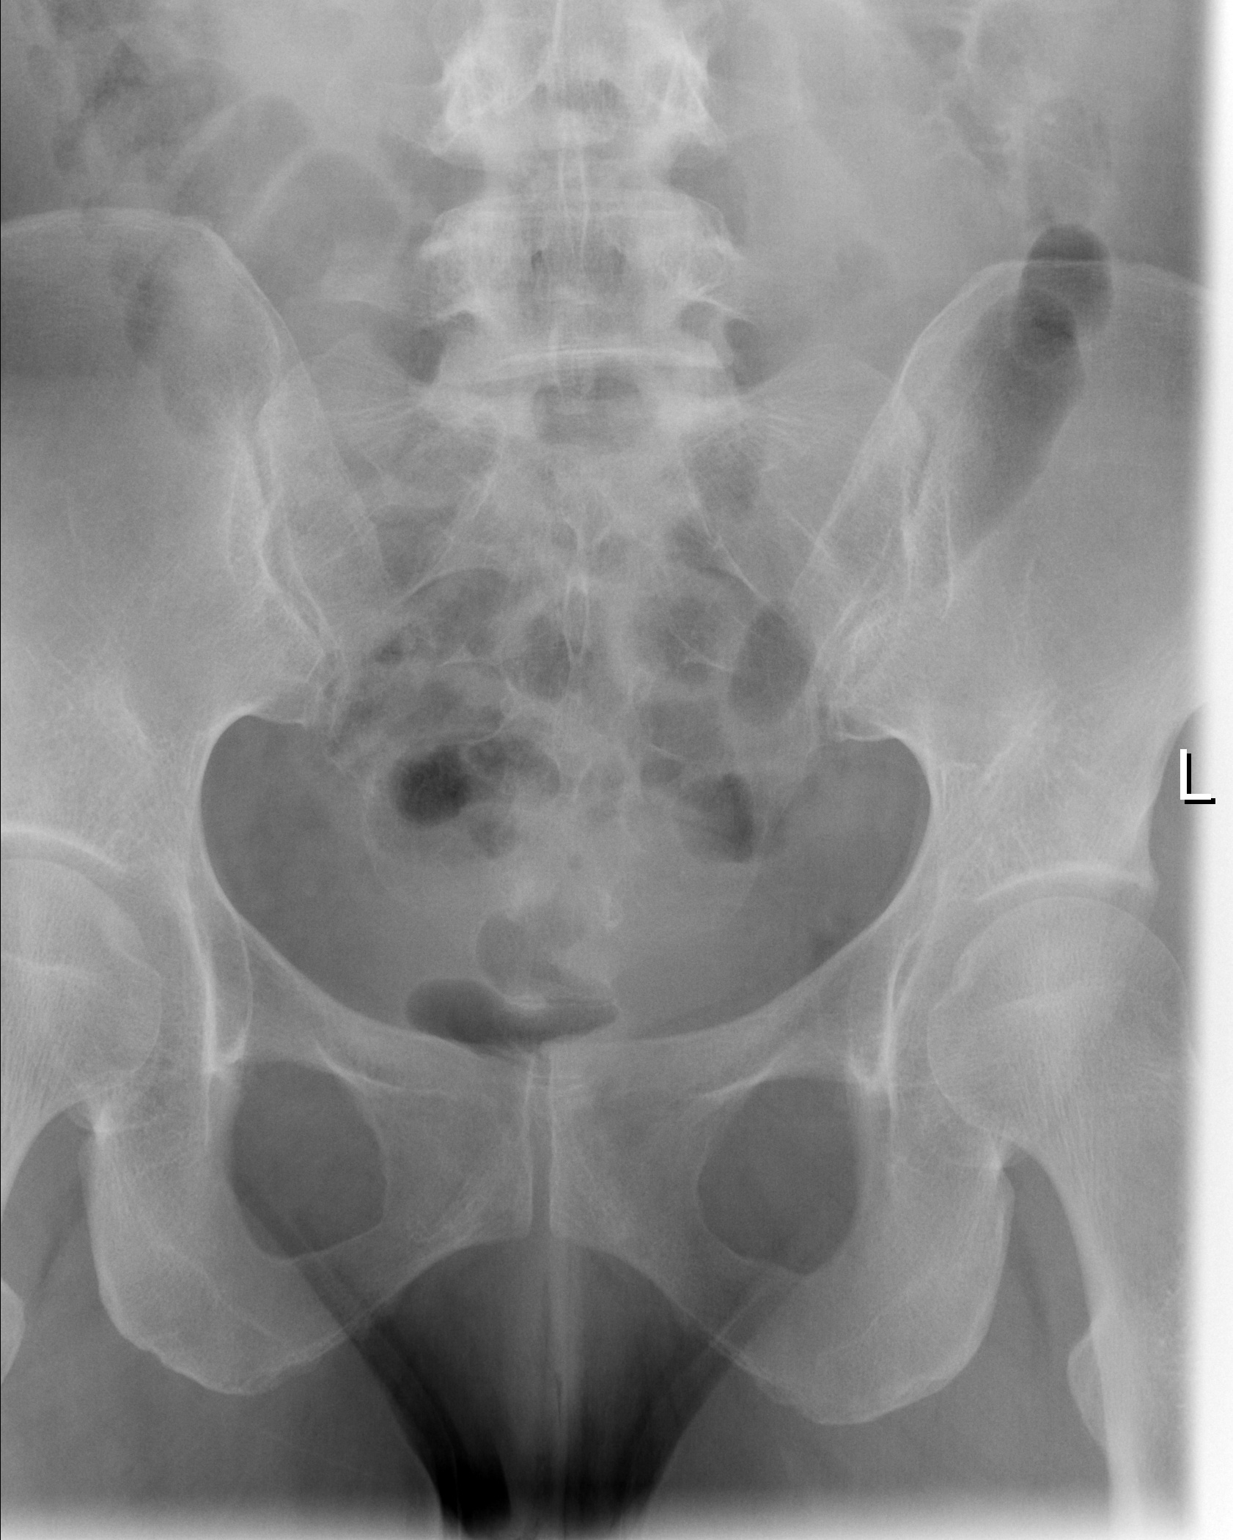

[t coccyx a.p.]
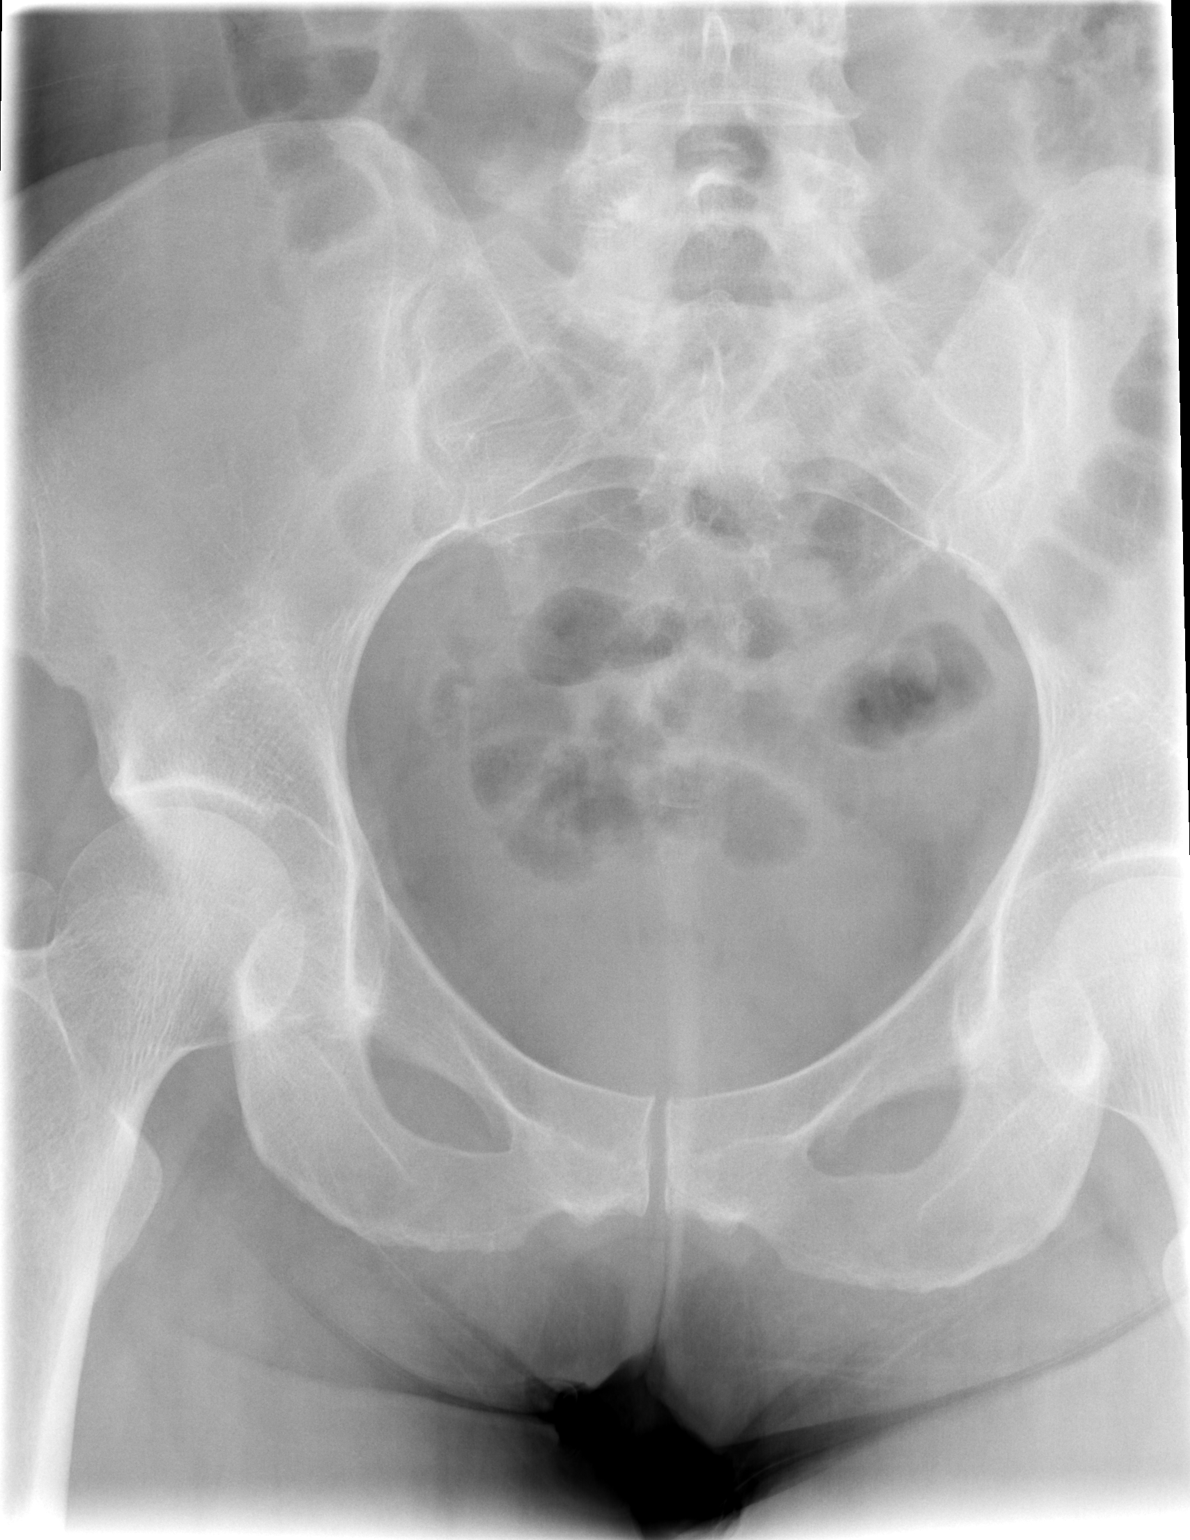

[t coccyx lat]
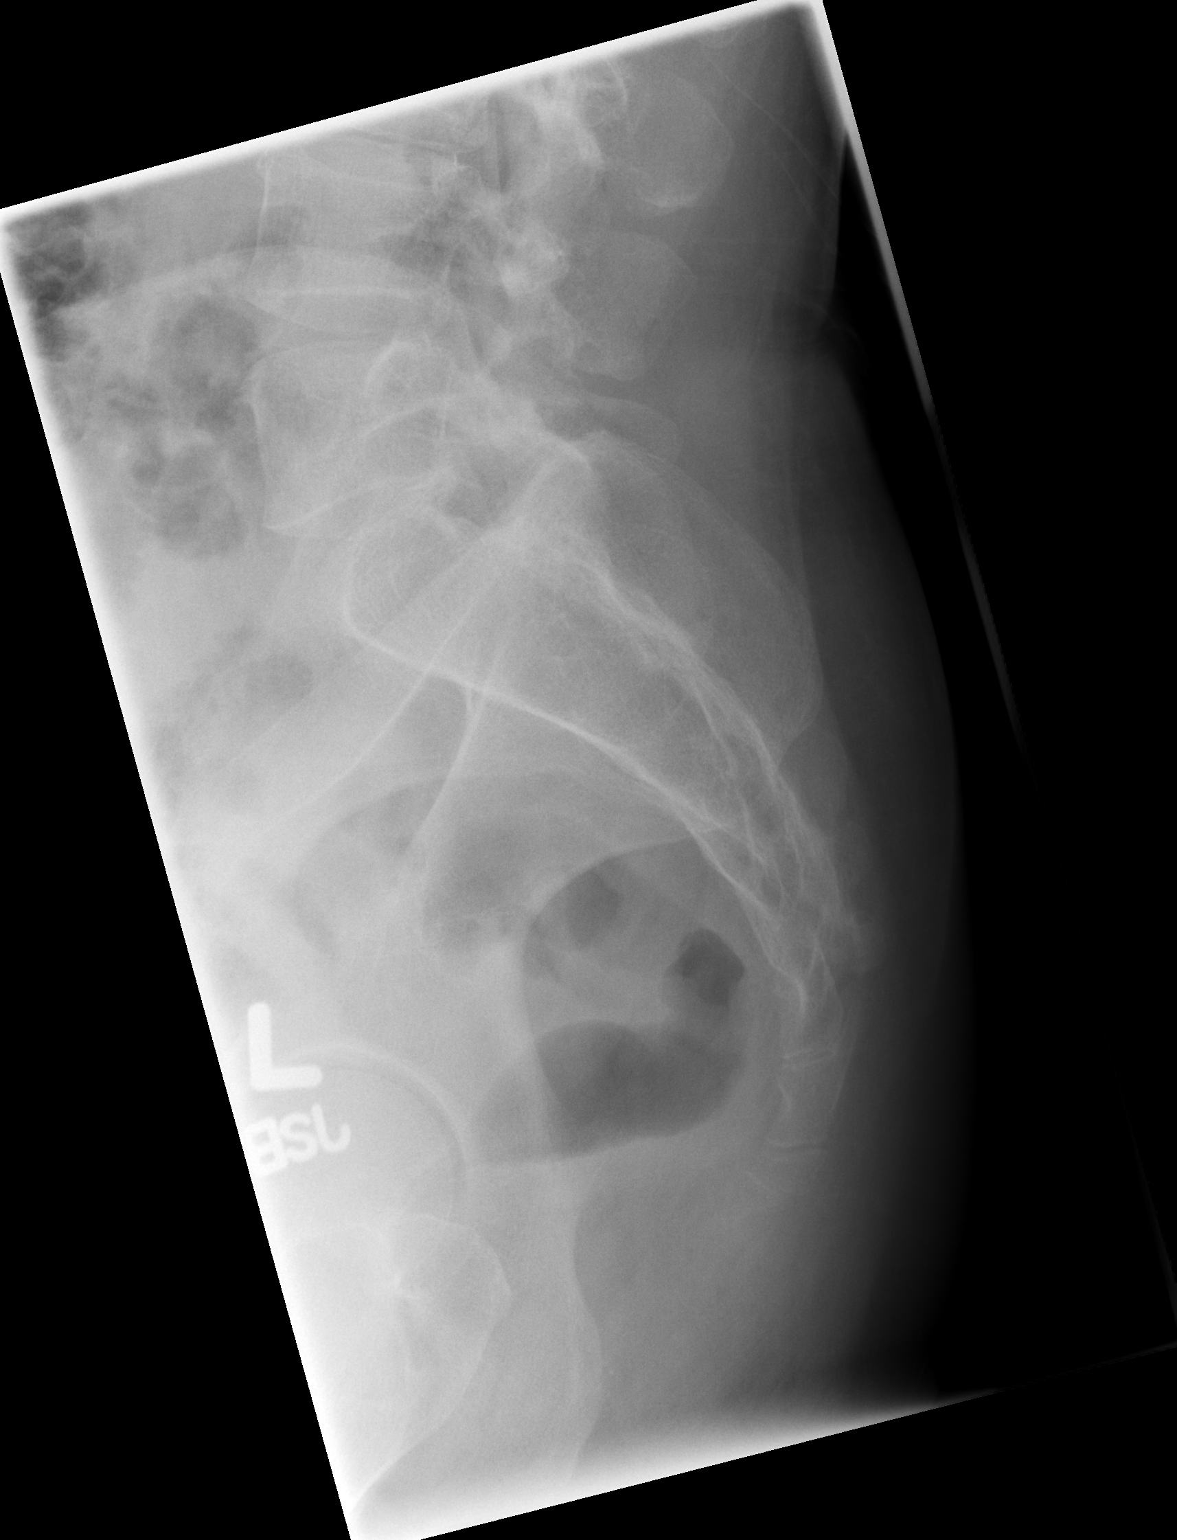

[3 of 3 positions shown; findings below may reference images not displayed]

FINDINGS: Three views of the sacrum and coccyx demonstrate no acute
displaced fractures.  Visualized portions of the bony pelvis are
intact.
IMPRESSION: 1.  No acute displaced fracture of the sacrum or coccyx.

## 2011-04-24 IMAGING — CR DG KNEE COMPLETE 4+V*L*
4 series · 4 of 4 positions shown · non-contrast
Comparison: No priors.

CLINICAL DATA: Knee pain.

LEFT KNEE - COMPLETE 4+ VIEW

[t knee lat left]
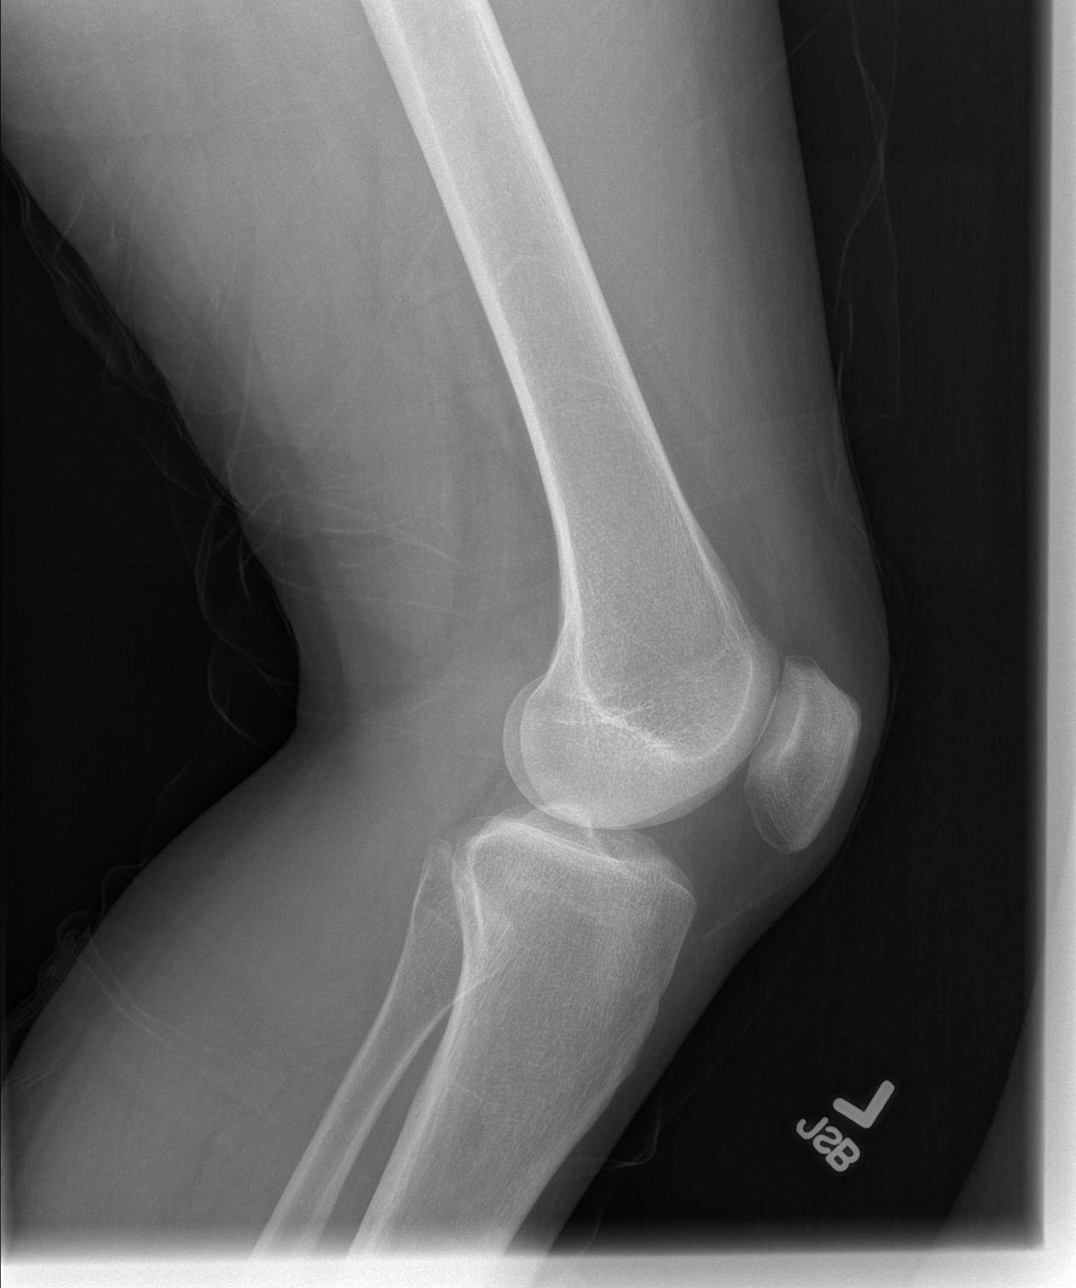

[t knee ap left]
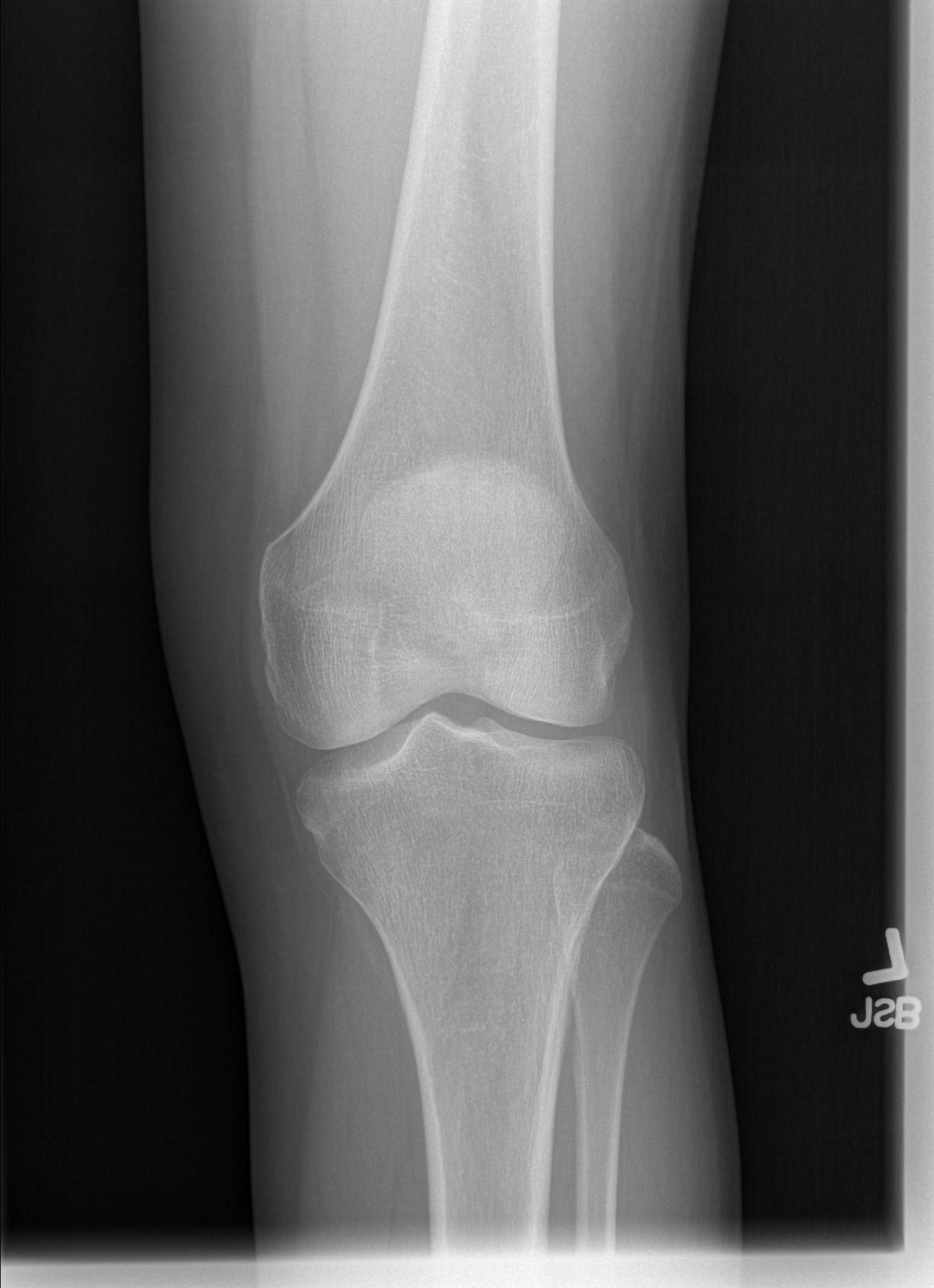

[t knee oblique left (1 of 2)]
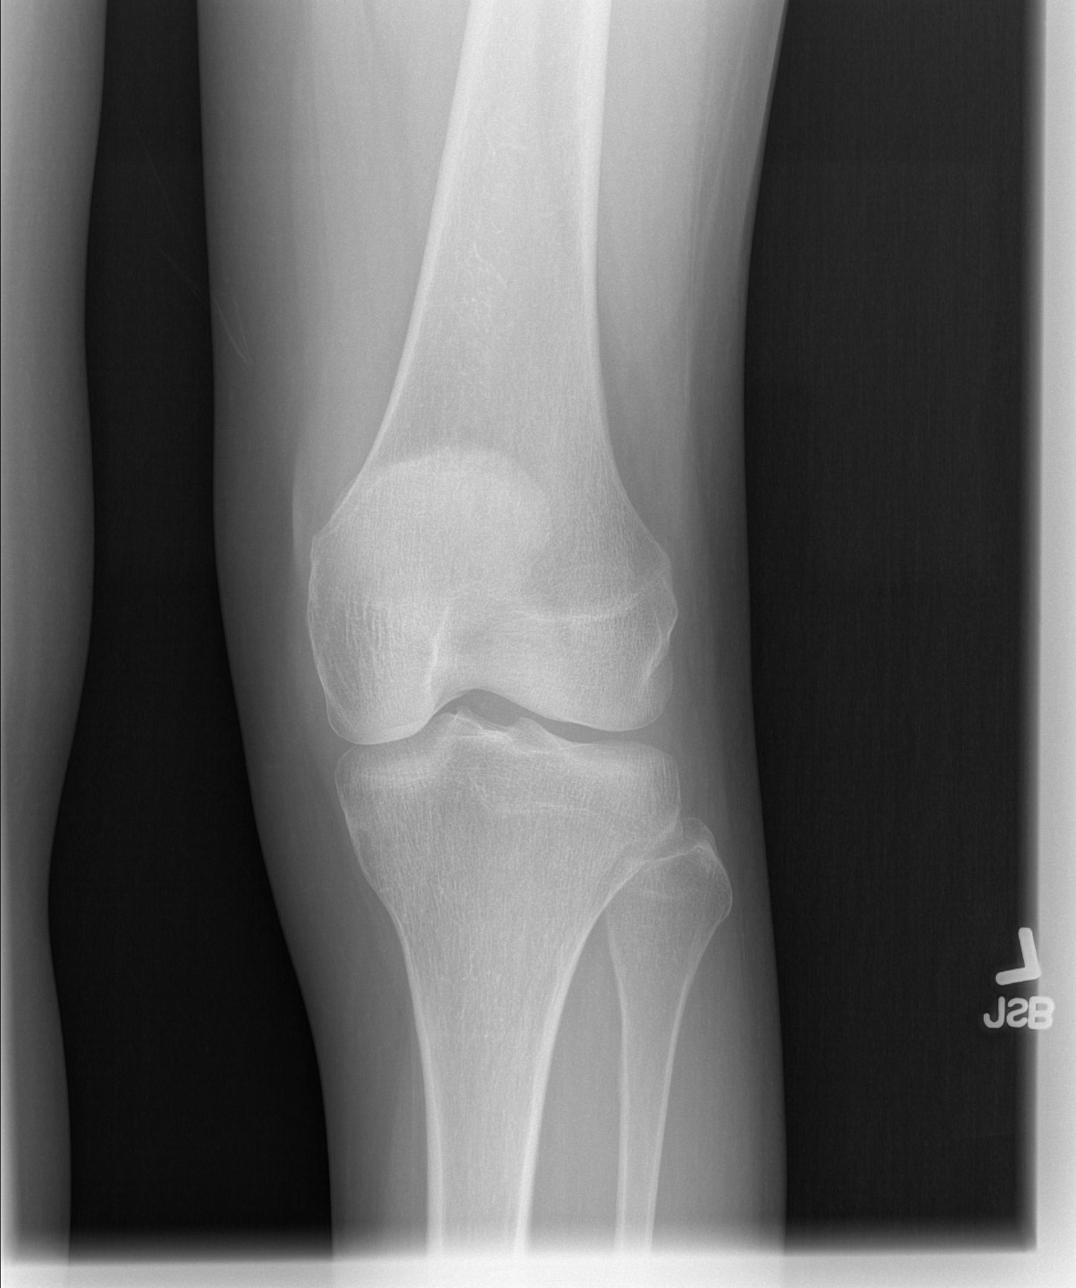

[t knee oblique left (2 of 2)]
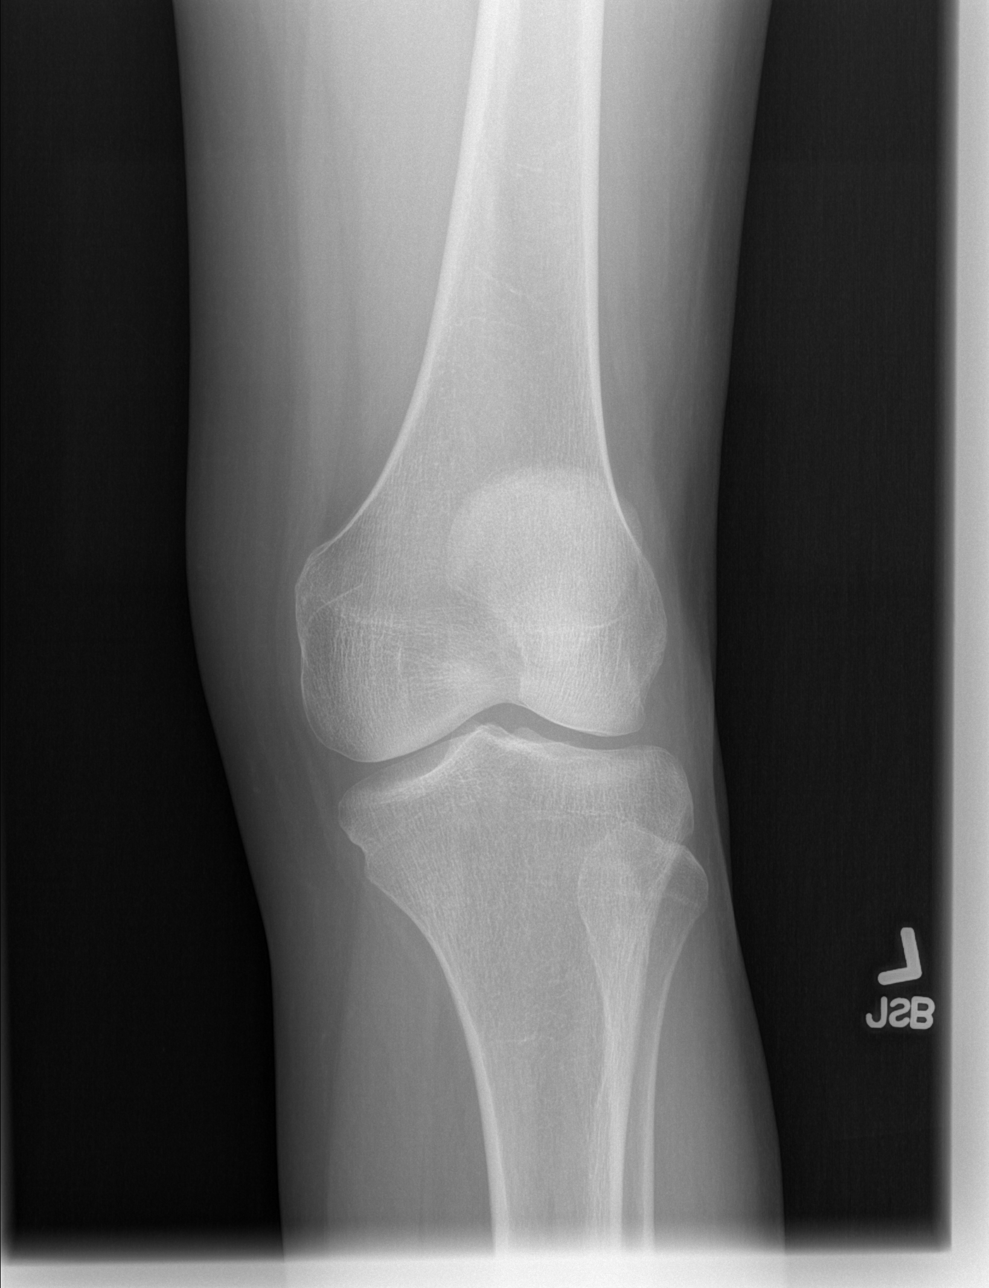

[4 of 4 positions shown; findings below may reference images not displayed]

FINDINGS: Four views of the left knee demonstrate no acute
fracture, subluxation, dislocation, joint or soft tissue
abnormality.
IMPRESSION: 1.  No acute radiographic abnormality of the left knee.

## 2011-04-24 NOTE — Assessment & Plan Note (Signed)
Patient has history of being on multiple medications in past.   Abilify is one of the few that she has not been started on. Still taking Sertraline as prescribed. My concern is that she does states "someone mentioned bipolar once" in past and I believe this is likely to be the case as well.   Due to abdominal pain and concern over STD did not have a chance to really delve into psych history.   Discussed [facility]DC with her and she is open to this.  Will make appt on way out.   She will FU with me before then in next 2-4 weeks so we can get more information.

## 2011-04-24 NOTE — Assessment & Plan Note (Signed)
Obtained wet prep, GC/Chlamydia. Concern for PID. Plan to treat today empirically for GC/Chlamydia today plus possible PID once we receive test results. Less likely ovarian torsion due to constant nature of pain, appendicitis due to eating well, no nausea.   Will call with results, to FU before then if needed.  Red flags and warnings given.
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
## 2011-04-24 NOTE — Telephone Encounter (Signed)
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  First available is March 6th at 10:00.  Scheduled for then but given insurance status, provided her other possibilities to see if she can get in sooner.  She is to call if that is the case.

## 2011-04-25 ENCOUNTER — Telehealth: Payer: Self-pay | Admitting: Family Medicine

## 2011-04-25 DIAGNOSIS — R102 Pelvic and perineal pain: Secondary | ICD-10-CM

## 2011-04-25 LAB — CULTURE, URINE COMPREHENSIVE

## 2011-04-25 NOTE — Telephone Encounter (Signed)
Still having pain.  Describes pain as fairly constant stabbing pain in suprapubic area that spreads to bilateral pelvis.  Hydrocodone relieves pain but once it wears off pain does recur.  Did mention that she forgot she had episode of Right sided back pain that started last week.    UA showed some small blood.  I am concerned for kidney stone.  Plan to obtain CT abdomen to rule out this or other abdominal process (ovarian cyst, etc).

## 2011-04-25 NOTE — Telephone Encounter (Signed)
Refill request

## 2011-04-25 NOTE — Telephone Encounter (Signed)
Wants to know test results from Monday - she is still in a lot of pain.  Wants to know what she should do,.

## 2011-04-26 NOTE — Telephone Encounter (Signed)
Patient had US done 1/23

## 2011-04-27 ENCOUNTER — Telehealth: Payer: Self-pay | Admitting: *Deleted

## 2011-04-27 NOTE — Telephone Encounter (Signed)
Pt called and informed of appt for ct on 01.30.2013 @ 900 am at [facility] building). Told to go by and p/u contrast by Monday and get further instructions regarding prep for scan. Pt understood and agreed.Loralee Pacas [place]

## 2011-05-02 ENCOUNTER — Other Ambulatory Visit: Payer: BC Managed Care – PPO

## 2011-05-03 ENCOUNTER — Ambulatory Visit
Admission: RE | Admit: 2011-05-03 | Discharge: 2011-05-03 | Disposition: A | Discharge status: Home / Self Care | Payer: BC Managed Care – PPO | Source: Ambulatory Visit | Attending: Family Medicine | Admitting: Family Medicine

## 2011-05-03 DIAGNOSIS — R102 Pelvic and perineal pain: Secondary | ICD-10-CM

## 2011-05-03 IMAGING — CT CT ABD-PELV W/ CM
3 of 5 series · 13 of 36 positions shown, 19 images · IV contrast (READICAT/WATER & [ID] OMNI 300)
Comparison: [DATE] abdominal pelvic CT.

CLINICAL DATA: Worsening pelvic and back pain for 2 weeks with
painful urination.  Microscopic hematuria.  Question kidney stone.

CT ABDOMEN AND PELVIS WITH CONTRAST
TECHNIQUE: Multidetector CT imaging of the abdomen and pelvis was
performed following the standard protocol during bolus
administration of intravenous contrast.
Contrast: 100mL OMNIPAQUE IOHEXOL 300 MG/ML IV SOLN

[Series 3: abd/pelvis with · axial · 0.64mm/px · z∈[-321,-51]mm · 6 of 68 slices shown, 11 images]
[im 10/68  soft-tissue]
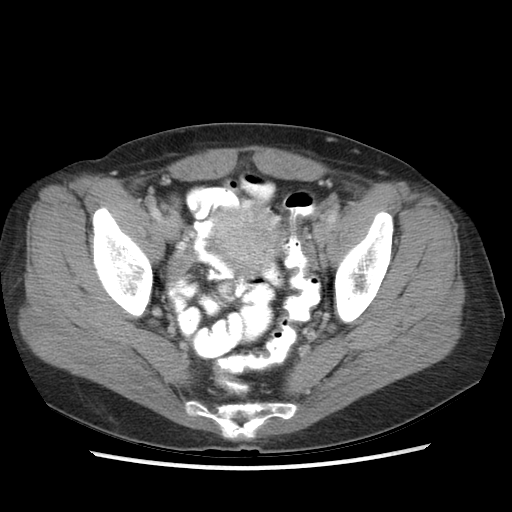
[im 10/68  bone]
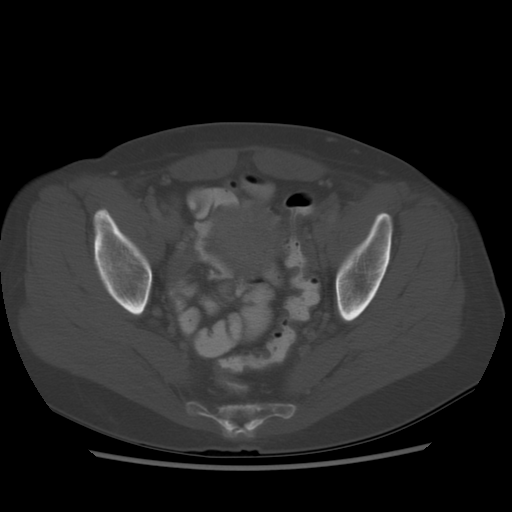
[im 20/68  soft-tissue]
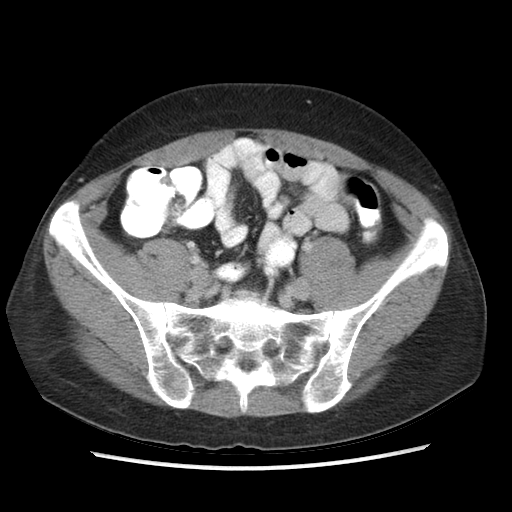
[im 29/68  soft-tissue]
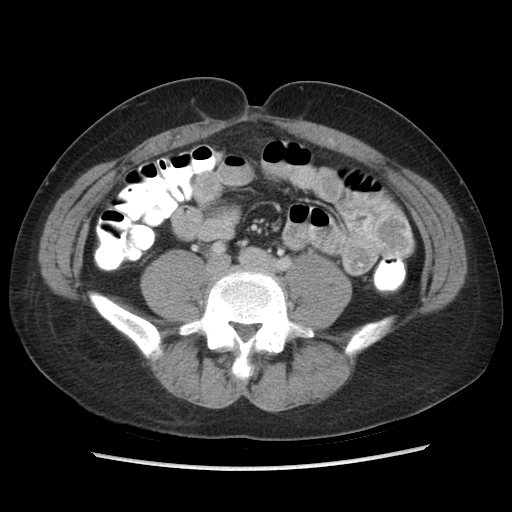
[im 29/68  lung]
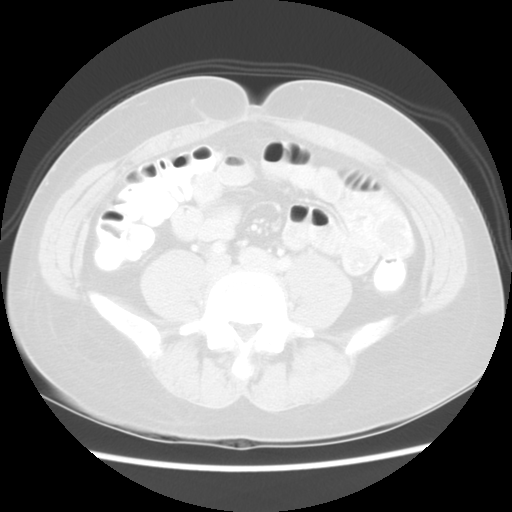
[im 39/68  soft-tissue]
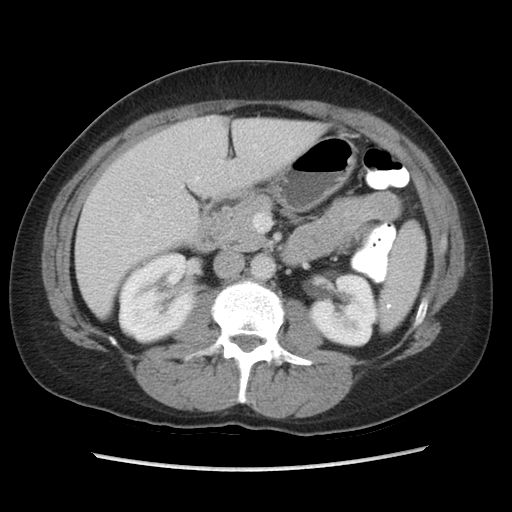
[im 39/68  lung]
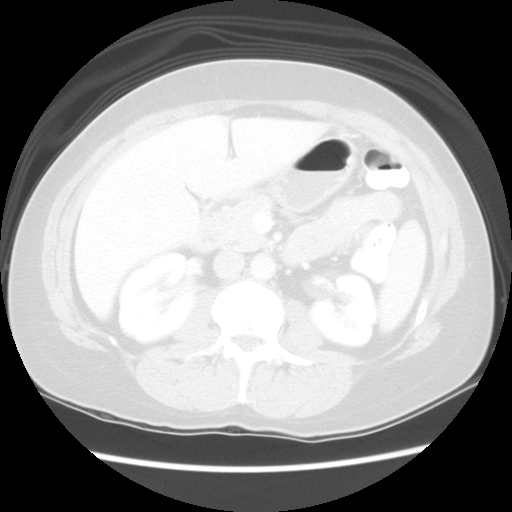
[im 48/68  soft-tissue]
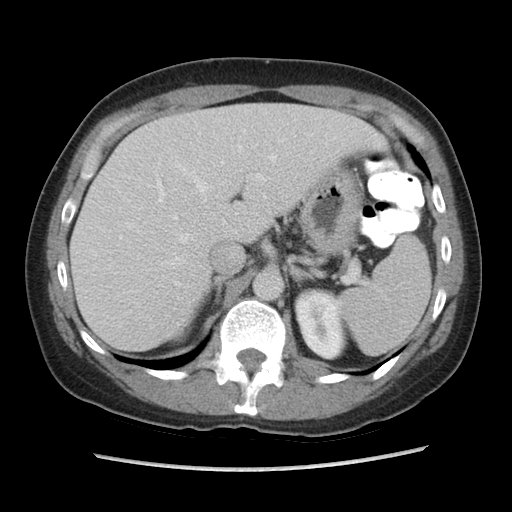
[im 48/68  lung]
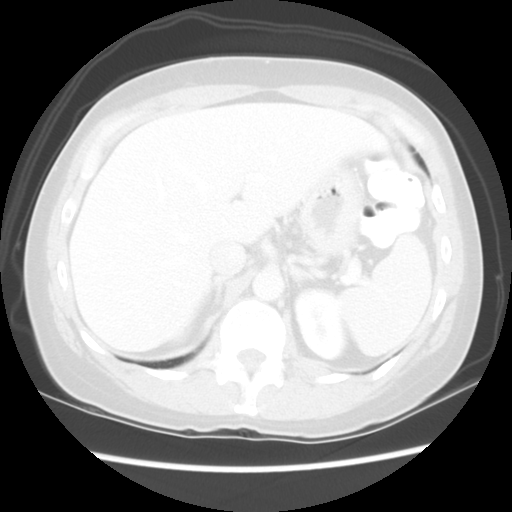
[im 58/68  soft-tissue]
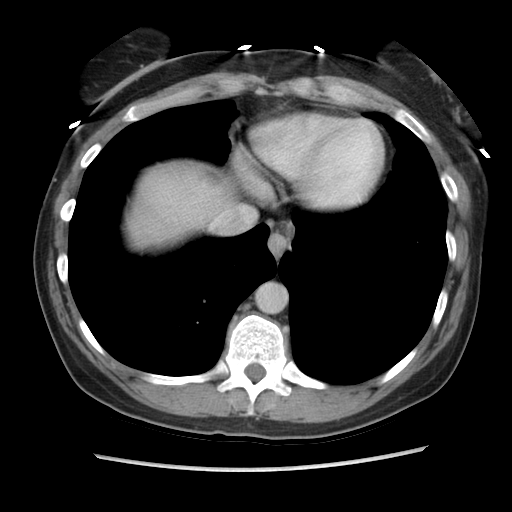
[im 58/68  lung]
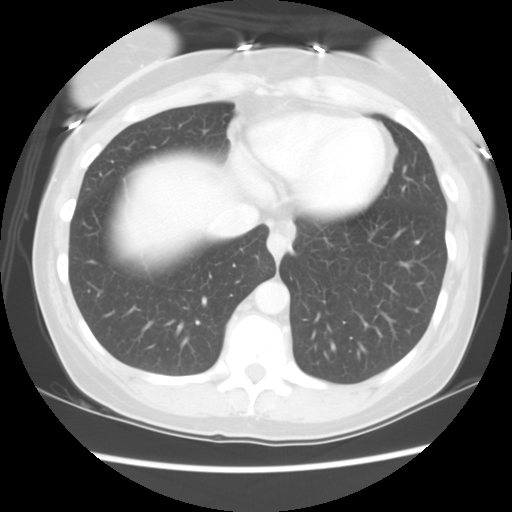

[Series 601: coronal body · coronal · 0.84mm/px · 1 of 117 slices shown, 2 images]
[im 39/117  soft-tissue]
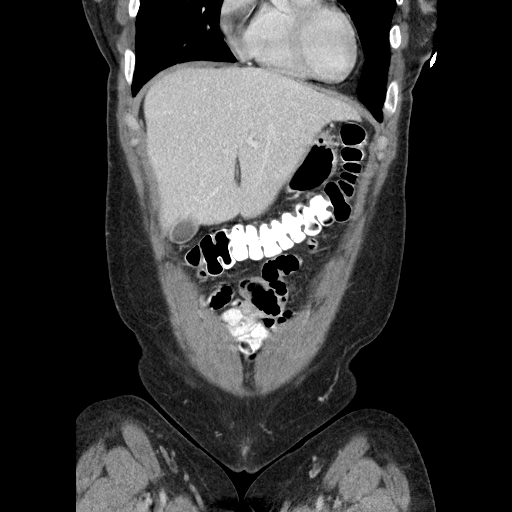
[im 39/117  bone]
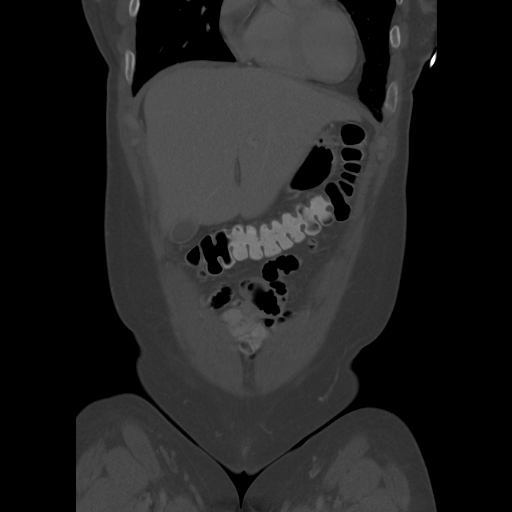

[Series 602: sagittal body · sagittal · 0.84mm/px · 6 of 133 slices shown]
[im 9/133  soft-tissue]
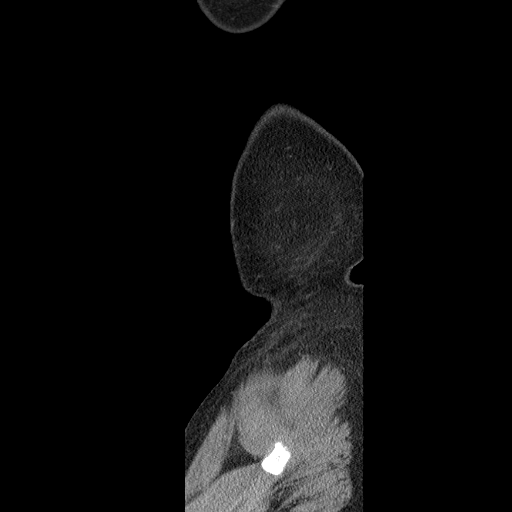
[im 25/133  soft-tissue]
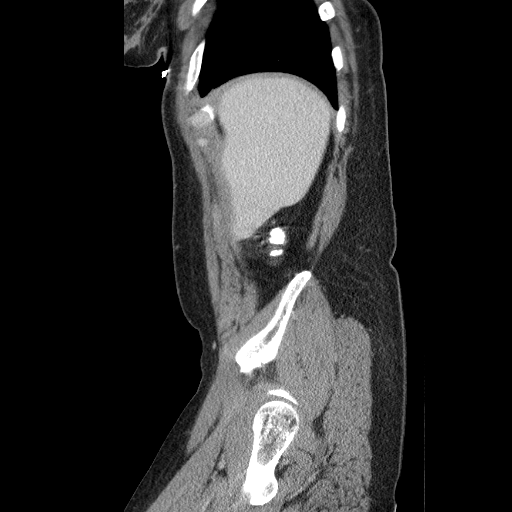
[im 42/133  soft-tissue]
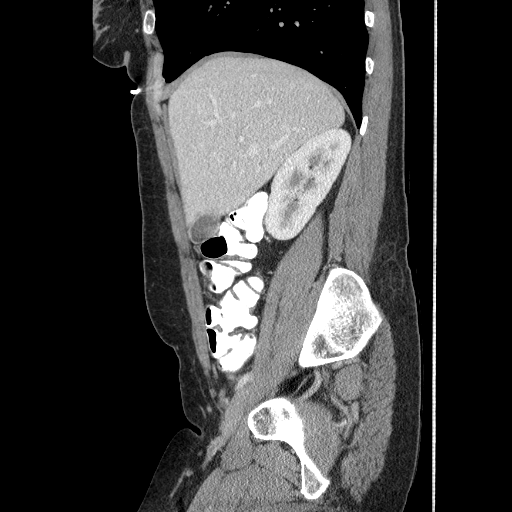
[im 58/133  soft-tissue]
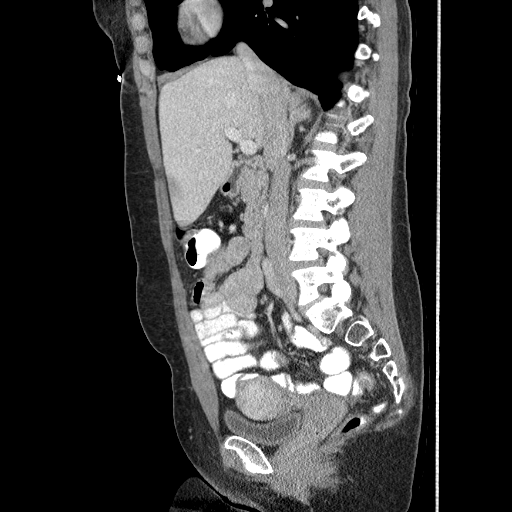
[im 75/133  soft-tissue]
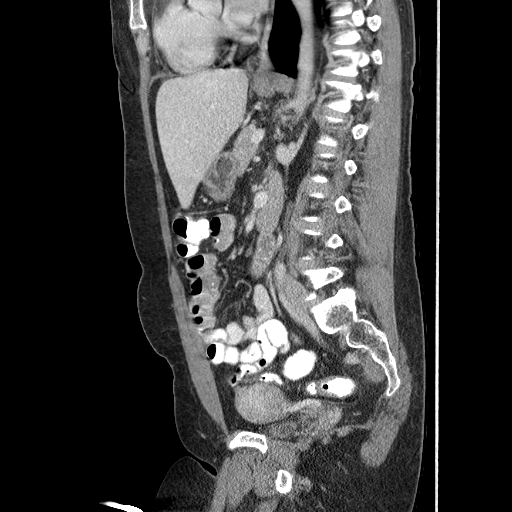
[im 91/133  soft-tissue]
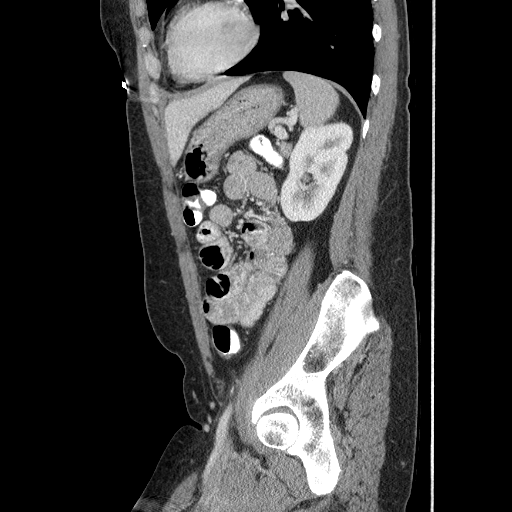

[13 of 36 positions shown; findings below may reference images not displayed]

FINDINGS: The lung bases are clear.  There is no pleural effusion.
There is incomplete healing of a fracture of the right eleventh rib
posteriorly.  The lumbar spine transverse process fractures
demonstrated previously have healed. No acute osseous findings are
seen.

There is no evidence of renal or ureteral calculus.  No focal renal
lesions are identified.  There is no hydronephrosis or delay in
contrast excretion.  The urinary bladder is nearly empty and
suboptimally evaluated, but demonstrates no apparent abnormality.

The liver appears stable.  There is focal fat adjacent to the
falciform ligament which appears unchanged.  The gallbladder,
biliary system, pancreas and spleen appear stable.  There are
calcified splenic granulomas.  The adrenal glands appear normal.

The bowel gas pattern appears normal.  The appendix appears normal.
The uterus and ovaries appear normal. There is no evidence of
pelvic inflammatory process.
IMPRESSION: 1.  No acute findings or explanation for hematuria identified on
postcontrast imaging.  If there is persistent unexplained
hematuria, further evaluation may be warranted.
2.  Incomplete healing of right eleventh rib fracture.  No acute
osseous findings.
3.  Chronic splenic granulomas.

## 2011-05-03 MED ORDER — IOHEXOL 300 MG/ML  SOLN
100.0000 mL | Freq: Once | INTRAMUSCULAR | Status: AC | PRN
  Administered 2011-05-03: 100 mL via INTRAVENOUS

## 2011-05-04 ENCOUNTER — Telehealth: Payer: Self-pay | Admitting: Family Medicine

## 2011-05-04 NOTE — Telephone Encounter (Signed)
LVM for patient to call back to inform her that I was forwarding the message to PCP.
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
## 2011-05-04 NOTE — Telephone Encounter (Signed)
Pt is also calling back to find out what her MRI results are and that she is still in pain.

## 2011-05-05 NOTE — Telephone Encounter (Signed)
Called and left message stating the CT scan of abdomen was negative for any acute process.  Unclear etiology of pain.  Also will provide her with any information or notes that she requires.  I will call her back and talk with her on Monday.

## 2011-05-08 ENCOUNTER — Emergency Department (INDEPENDENT_AMBULATORY_CARE_PROVIDER_SITE_OTHER)
Admission: EM | Admit: 2011-05-08 | Discharge: 2011-05-08 | Disposition: A | Discharge status: Home / Self Care | Payer: BC Managed Care – PPO | Source: Home / Self Care | Attending: Family Medicine | Admitting: Family Medicine

## 2011-05-08 ENCOUNTER — Telehealth: Payer: Self-pay | Admitting: Family Medicine

## 2011-05-08 DIAGNOSIS — R109 Unspecified abdominal pain: Secondary | ICD-10-CM

## 2011-05-08 MED ORDER — HYDROCODONE-ACETAMINOPHEN 10-325 MG PO TABS: 1.0000 | ORAL_TABLET | Freq: Four times a day (QID) | ORAL | Status: DC | PRN

## 2011-05-08 NOTE — ED Provider Notes (Signed)
History     CSN: [id number]  Arrival date & time 05/08/11  1642   First MD Initiated Contact with Patient 05/08/11 1821      Chief Complaint  Patient presents with  . Abdominal Pain    (Consider location/radiation/quality/duration/timing/severity/associated sxs/prior treatment) HPI Comments: Linda Cervantes presents for evaluation for chronic abdominal pain. She reports diffuse abdominal pain that is worse in the lower quadrants over the last 2 weeks. She has been evaluated by her primary provider, who obtained a CT scan, which was negative. She has been taking hydrocodone with some relief. She reports that she ran out of her hydrocodone today. She is requesting more to get through the night as she has an appointment tomorrow morning with her primary care provider. She did call the office today and request an appointment, but they cannot fit her into the schedule.  Patient is a 46 y.o. female presenting with abdominal pain. The history is provided by the patient.  Abdominal Pain The primary symptoms of the illness include abdominal pain and diarrhea. The primary symptoms of the illness do not include nausea, vomiting or dysuria. The current episode started more than 2 days ago. The problem has not changed since onset. The abdominal pain began more than 2 days ago. The abdominal pain has been unchanged since its onset. The abdominal pain is generalized. The abdominal pain does not radiate.  The patient states that she believes she is currently not pregnant. The patient has had a change in bowel habit. Additional symptoms associated with the illness include constipation.    Past Medical History  Diagnosis Date  . Anxiety   . Depression   . Substance abuse Clean as of 2009    Crack cocaine  . GERD (gastroesophageal reflux disease)     Past Surgical History  Procedure Date  . Cesarean section     No family history on file.  History  Substance Use Topics  . Smoking status: Current Some Day  Smoker -- 0.5 packs/day    Types: Cigarettes  . Smokeless tobacco: Not on file  . Alcohol Use: No    OB History    Grav Para Term Preterm Abortions TAB SAB Ect Mult Living                  Review of Systems  Constitutional: Negative.   HENT: Negative.   Eyes: Negative.   Respiratory: Negative.   Cardiovascular: Negative.   Gastrointestinal: Positive for abdominal pain, diarrhea and constipation. Negative for nausea and vomiting.  Genitourinary: Negative.  Negative for dysuria.  Musculoskeletal: Negative.   Skin: Negative.   Neurological: Negative.     Allergies  Review of patient's allergies indicates no known allergies.  Home Medications   Current Outpatient Rx  Name Route Sig Dispense Refill  . ALBUTEROL SULFATE HFA 108 (90 BASE) MCG/ACT IN AERS Inhalation Inhale 2 puffs into the lungs every 6 (six) hours as needed. For shortness of breath     . ESOMEPRAZOLE MAGNESIUM 20 MG PO CPDR Oral Take 20 mg by mouth daily.      Marland Kitchen HYDROCODONE-ACETAMINOPHEN 10-325 MG PO TABS Oral Take 1 tablet by mouth every 6 (six) hours as needed. 10 tablet 0  . SERTRALINE HCL 50 MG PO TABS  TAKE 1 TABLET BY MOUTH EVERY DAY 90 tablet 2    No refills available  . TRAZODONE HCL 50 MG PO TABS Oral Take 2 tablets (100 mg total) by mouth at bedtime as needed for sleep. 60 tablet  3  . ZOLPIDEM TARTRATE 10 MG PO TABS Oral Take 1 tablet (10 mg total) by mouth at bedtime as needed. For insomnia 15 tablet 1    BP 128/73  Pulse 68  Temp(Src) 98.3 F (36.8 C) (Oral)  Resp 18  SpO2 100%  LMP 05/07/2011  Physical Exam  Nursing note and vitals reviewed. Constitutional: She is oriented to person, place, and time. She appears well-developed and well-nourished.  HENT:  Head: Normocephalic and atraumatic.  Eyes: EOM are normal.  Neck: Normal range of motion.  Pulmonary/Chest: Effort normal.  Abdominal: Normal appearance and bowel sounds are normal. There is tenderness in the right lower quadrant,  suprapubic area and left lower quadrant. There is no rebound.  Musculoskeletal: Normal range of motion.  Neurological: She is alert and oriented to person, place, and time.  Skin: Skin is warm and dry.  Psychiatric: Her behavior is normal.    ED Course  Procedures (including critical care time)  Labs Reviewed - No data to display No results found.   1. Abdominal pain       MDM  Refilled hydrocodone ten pills until appointment in the AM with Dr. Gwendolyn Grant. Reviewed CT scan; no acute findings.        Richardo Priest, MD 05/08/11 [phone]

## 2011-05-08 NOTE — Telephone Encounter (Signed)
Scheduled patient 9:15am appointment 05/09/2011 with cross cover to be seen for abdominal/back pain. Patient was informed that she may not be able to receive pain meds at this time due to her being a work-in and she will not be seeing her PCP. I spoke with Dr. Gwendolyn Grant and he sates that she needs to come in for an office visit with him for pain medication refills

## 2011-05-08 NOTE — ED Notes (Signed)
Pt. Stated, I'm having abd. Pain and back pain for 21/2 weeks.  I took my last 2 HYdrocodone last night and I have an appt. In the am at 915 and the pain was just too bad to wait for in the am. I had a CAT scan this week of my kidneys or ovaries but no results.  I started my period yesterday and its the worse.

## 2011-05-08 NOTE — Telephone Encounter (Signed)
She needs to come back in to be seen.

## 2011-05-08 NOTE — Telephone Encounter (Signed)
Pt asking to speak with MD about her pain, says the scans came back normal but is still having a lot of pain.

## 2011-05-09 ENCOUNTER — Ambulatory Visit: Payer: No Typology Code available for payment source

## 2011-05-10 ENCOUNTER — Ambulatory Visit: Payer: No Typology Code available for payment source | Admitting: Family Medicine

## 2011-05-10 ENCOUNTER — Ambulatory Visit (INDEPENDENT_AMBULATORY_CARE_PROVIDER_SITE_OTHER): Payer: BC Managed Care – PPO | Admitting: Family Medicine

## 2011-05-10 ENCOUNTER — Encounter: Payer: Self-pay | Admitting: Family Medicine

## 2011-05-10 VITALS — BP 112/70 | HR 84 | Temp 98.5°F | Ht 63.0 in | Wt 140.0 lb

## 2011-05-10 DIAGNOSIS — R102 Pelvic and perineal pain: Secondary | ICD-10-CM

## 2011-05-10 DIAGNOSIS — N949 Unspecified condition associated with female genital organs and menstrual cycle: Secondary | ICD-10-CM

## 2011-05-10 LAB — CBC
HCT: 38.6 % (ref 36.0–46.0)
Hemoglobin: 12.7 g/dL (ref 12.0–15.0)
MCH: 28.4 pg (ref 26.0–34.0)
MCV: 86.4 fL (ref 78.0–100.0)
RBC: 4.47 MIL/uL (ref 3.87–5.11)
WBC: 6.3 10*3/uL (ref 4.0–10.5)

## 2011-05-10 LAB — COMPREHENSIVE METABOLIC PANEL
CO2: 27 mEq/L (ref 19–32)
Calcium: 9.1 mg/dL (ref 8.4–10.5)
Chloride: 107 mEq/L (ref 96–112)
Creat: 0.69 mg/dL (ref 0.50–1.10)
Glucose, Bld: 85 mg/dL (ref 70–99)
Total Bilirubin: 0.4 mg/dL (ref 0.3–1.2)

## 2011-05-10 MED ORDER — IBUPROFEN 800 MG PO TABS: 800.0000 mg | ORAL_TABLET | Freq: Three times a day (TID) | ORAL | Status: AC | PRN

## 2011-05-10 MED ORDER — HYDROCODONE-ACETAMINOPHEN 10-325 MG PO TABS: 1.0000 | ORAL_TABLET | Freq: Three times a day (TID) | ORAL | Status: DC | PRN

## 2011-05-10 NOTE — Patient Instructions (Signed)
Come back and see me next week so we can see how you are doing. We are getting some lab tests today and I will let you know what that shows.
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
## 2011-05-11 NOTE — Assessment & Plan Note (Signed)
Paul differential. She is out he had a negative CT scan which is reassuring. She has no red flag based on history or physical. Plan today is to check LFTs adn white count to assess for any liver dysfunction or possible elevated leukocytosis which might indicate intra-abdominal process. Provided her hydrocodone for pain relief. Discussed that this can worsen her constipation. Recommended stool softeners and either prune juice or MiraLax. Followup next week

## 2011-05-17 ENCOUNTER — Ambulatory Visit (INDEPENDENT_AMBULATORY_CARE_PROVIDER_SITE_OTHER): Payer: No Typology Code available for payment source | Admitting: Family Medicine

## 2011-05-17 ENCOUNTER — Encounter: Payer: Self-pay | Admitting: Family Medicine

## 2011-05-17 VITALS — BP 110/69 | HR 71 | Temp 98.4°F | Ht 63.0 in | Wt 138.4 lb

## 2011-05-17 DIAGNOSIS — N949 Unspecified condition associated with female genital organs and menstrual cycle: Secondary | ICD-10-CM

## 2011-05-17 DIAGNOSIS — R102 Pelvic and perineal pain: Secondary | ICD-10-CM

## 2011-05-17 MED ORDER — HYDROCODONE-ACETAMINOPHEN 10-325 MG PO TABS: 1.0000 | ORAL_TABLET | Freq: Three times a day (TID) | ORAL | Status: DC | PRN

## 2011-05-17 MED ORDER — QUETIAPINE FUMARATE 50 MG PO TABS: 50.0000 mg | ORAL_TABLET | Freq: Every day | ORAL | Status: DC

## 2011-05-17 NOTE — Progress Notes (Signed)
Made appointment  For patient while she was in office and gave her information. Dr. Lily Peer with [facility]. [address], [address] on 2/25 @ 10am, patient to arrive @ 9:30am. Phone number given to patient if needing to cancel or reschedule. [phone]. Faxed OV notes and referrakl to [phone]. Dr. Gwendolyn Grant wanted patient in as soon as possible, had same day and next week appointments, patient declined those 2 due to no money and court issues, she says she will have the $40 copay by then.Erma Pinto, CMA

## 2011-05-17 NOTE — Patient Instructions (Signed)
Stop the Trazodone. We'll get you set up with Gyn.   Take the Seroquel at night.
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
## 2011-05-21 NOTE — Assessment & Plan Note (Signed)
All labwork and imagining negative up to this point. Gyn exam revealed no noticeable pelvic relaxation at prior OV. However, this seems to be most consistent with patient's complaints. Refer to GYN for further eval and recommendations regarding pelvic pain and any relaxation treatments.   Refilled hydrocodone for relief along with constipation precautions.

## 2011-05-28 ENCOUNTER — Ambulatory Visit: Payer: No Typology Code available for payment source | Admitting: Gynecology

## 2011-05-29 ENCOUNTER — Ambulatory Visit: Payer: No Typology Code available for payment source | Admitting: Gynecology

## 2011-05-31 ENCOUNTER — Ambulatory Visit: Payer: No Typology Code available for payment source | Admitting: Gynecology

## 2011-06-04 ENCOUNTER — Telehealth: Payer: Self-pay | Admitting: Family Medicine

## 2011-06-04 NOTE — Telephone Encounter (Signed)
Called and informed pt of what Dr. Gwendolyn Grant said and she has an appt on 03.05.2013 .Loralee Pacas [place]

## 2011-06-04 NOTE — Telephone Encounter (Signed)
Agree.  Cannot fill unless see her in person.

## 2011-06-04 NOTE — Telephone Encounter (Signed)
Called and informed pt that we cannot refill her medications. I told her that due to the type of medications she is taking that the likelihood of getting refills are not good. I told her that I would forward this message to Dr. Gwendolyn Grant for advise.Linda Cervantes [place]
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
## 2011-06-05 ENCOUNTER — Encounter: Payer: Self-pay | Admitting: Family Medicine

## 2011-06-05 ENCOUNTER — Ambulatory Visit (INDEPENDENT_AMBULATORY_CARE_PROVIDER_SITE_OTHER): Payer: BC Managed Care – PPO | Admitting: Family Medicine

## 2011-06-05 VITALS — BP 128/82 | HR 88 | Temp 97.9°F | Ht 63.0 in | Wt 136.0 lb

## 2011-06-05 DIAGNOSIS — F329 Major depressive disorder, single episode, unspecified: Secondary | ICD-10-CM

## 2011-06-05 DIAGNOSIS — N949 Unspecified condition associated with female genital organs and menstrual cycle: Secondary | ICD-10-CM

## 2011-06-05 DIAGNOSIS — R102 Pelvic and perineal pain: Secondary | ICD-10-CM

## 2011-06-05 LAB — POCT URINALYSIS DIPSTICK
Bilirubin, UA: NEGATIVE
Ketones, UA: NEGATIVE
Leukocytes, UA: NEGATIVE
Nitrite, UA: NEGATIVE
Protein, UA: NEGATIVE
pH, UA: 6.5

## 2011-06-05 LAB — POCT UA - MICROSCOPIC ONLY

## 2011-06-05 MED ORDER — ESOMEPRAZOLE MAGNESIUM 20 MG PO CPDR: 20.0000 mg | DELAYED_RELEASE_CAPSULE | Freq: Every day | ORAL | Status: DC

## 2011-06-05 MED ORDER — CEPHALEXIN 500 MG PO CAPS: 500.0000 mg | ORAL_CAPSULE | Freq: Two times a day (BID) | ORAL | Status: AC

## 2011-06-05 MED ORDER — ZOLPIDEM TARTRATE 10 MG PO TABS: 10.0000 mg | ORAL_TABLET | Freq: Every evening | ORAL | Status: DC | PRN

## 2011-06-05 MED ORDER — HYDROCODONE-ACETAMINOPHEN 10-325 MG PO TABS: 1.0000 | ORAL_TABLET | Freq: Three times a day (TID) | ORAL | Status: DC | PRN

## 2011-06-05 NOTE — Patient Instructions (Signed)
We did see some blood in urine. Take the antibiotics once in the morning and once in the evening for the next 7 days. I will let you know the results of the urine culture showed later this week. Make sure you keep your appointment with [facility] disorder clinic tomorrow. They can review your psychiatric meds at that time
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
## 2011-06-06 ENCOUNTER — Ambulatory Visit (INDEPENDENT_AMBULATORY_CARE_PROVIDER_SITE_OTHER): Payer: BC Managed Care – PPO | Admitting: Psychology

## 2011-06-06 DIAGNOSIS — F17200 Nicotine dependence, unspecified, uncomplicated: Secondary | ICD-10-CM

## 2011-06-06 DIAGNOSIS — F329 Major depressive disorder, single episode, unspecified: Secondary | ICD-10-CM

## 2011-06-06 DIAGNOSIS — Z720 Tobacco use: Secondary | ICD-10-CM

## 2011-06-06 LAB — DRUG SCREEN, URINE
Amphetamine Screen, Ur: NEGATIVE
Barbiturate Quant, Ur: NEGATIVE
Cocaine Metabolites: NEGATIVE
Methadone: NEGATIVE
Opiates: NEGATIVE

## 2011-06-06 LAB — URINE CULTURE: Colony Count: NO GROWTH

## 2011-06-06 MED ORDER — SERTRALINE HCL 100 MG PO TABS: ORAL_TABLET | ORAL | Status: DC

## 2011-06-06 MED ORDER — QUETIAPINE FUMARATE 100 MG PO TABS: 100.0000 mg | ORAL_TABLET | Freq: Every day | ORAL | Status: DC

## 2011-06-06 NOTE — Assessment & Plan Note (Signed)
Report of mood is depressed and anxious.  She appears anxious and otherwise has an appropriately variable affect including the ability to laugh.  Thoughts are clear and goal directed.  With history of substance abuse starting at such an early age and continuing until very recent times, difficult to get a historical account of symptoms.  Presently, she is likely experiencing depression and anxiety, however the particular type is difficult to discern.  Given the fact she remembers feeling better on 100 mg of Zoloft in the past, Dr. Kathrynn Running thought that was the best option at present.  In addition, sleep is a bothersome symptom and she reports Seroquel has been helpful with that.  It is also a good anti-depressant and pushing this dose both for mood control and sleep benefit seems reasonable.    Pam wants something to help keep her calm during the day.  She has taken her mother's (I think) Xanax and this worked well.  We explained that the way her brain is wired from the beginning and the changes that have likely occurred as a result of her substance use, that Xanax would be a very bad match and could potentially do harm.  Attempted to look at non-pharmacologic ways to manage anxiety (e.g. Exercise, structured activities) however there was resistance here and we were running out of time.  Will look to this in future.  Getting a therapist to help manage this aspect of her care would be ideal.  Did not have a great amount of time to explore this but it needs to be part of the treatment picture.    See patient instructions for further plan.

## 2011-06-06 NOTE — Assessment & Plan Note (Signed)
Did not assess thoughts about quitting.  Would suspect that given her anxiety issues, this is lower on the list at present.

## 2011-06-06 NOTE — Patient Instructions (Signed)
You have an appointment with [place] on April 17th at 10:00. You also have an appointment with Dr. Gwendolyn Grant on March 18th at 8:45. Dr. Kathrynn Running agreed that increasing your Zoloft and Seroquel makes good sense.  You have been on Zoloft before at 100 mg and you thought that worked better for you. We are glad you are able to take the Seroquel without difficulty and that you are interested in increasing it.  You can take 100 mg of Seroquel until you see Dr. Gwendolyn Grant in two weeks.  If you are doing well on 100 mg of Seroquel, Dr. Gwendolyn Grant will likely tell you it is okay to increase your Seroquel with a target range of 150 to 300 mg.  We think seeing a therapist would be a huge point of your treatment.  You are feeling nervous partly because of the way you are wired and partly because you have a VERY stressful experience.  [facility] [place] might be a good Microbiologist for you.  They likely take your insurance and can help you even if you lose your insurance.  Their phone number is:  [phone].
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
## 2011-06-08 ENCOUNTER — Ambulatory Visit (INDEPENDENT_AMBULATORY_CARE_PROVIDER_SITE_OTHER): Payer: BC Managed Care – PPO | Admitting: Gynecology

## 2011-06-08 ENCOUNTER — Other Ambulatory Visit: Payer: Self-pay | Admitting: Gynecology

## 2011-06-08 ENCOUNTER — Ambulatory Visit (INDEPENDENT_AMBULATORY_CARE_PROVIDER_SITE_OTHER): Payer: BC Managed Care – PPO

## 2011-06-08 ENCOUNTER — Encounter: Payer: Self-pay | Admitting: Gynecology

## 2011-06-08 VITALS — BP 130/88 | Ht 62.0 in | Wt 136.0 lb

## 2011-06-08 DIAGNOSIS — N946 Dysmenorrhea, unspecified: Secondary | ICD-10-CM

## 2011-06-08 DIAGNOSIS — G8929 Other chronic pain: Secondary | ICD-10-CM

## 2011-06-08 DIAGNOSIS — R319 Hematuria, unspecified: Secondary | ICD-10-CM

## 2011-06-08 DIAGNOSIS — R350 Frequency of micturition: Secondary | ICD-10-CM

## 2011-06-08 DIAGNOSIS — N920 Excessive and frequent menstruation with regular cycle: Secondary | ICD-10-CM

## 2011-06-08 DIAGNOSIS — N949 Unspecified condition associated with female genital organs and menstrual cycle: Secondary | ICD-10-CM

## 2011-06-08 LAB — URINALYSIS W MICROSCOPIC + REFLEX CULTURE
Casts: NONE SEEN
Glucose, UA: NEGATIVE mg/dL
Ketones, ur: NEGATIVE mg/dL
Protein, ur: NEGATIVE mg/dL
WBC, UA: NONE SEEN WBC/hpf (ref <3)
pH: 5 (ref 5.0–8.0)

## 2011-06-08 NOTE — Assessment & Plan Note (Addendum)
UA does show some blood today. CT scan has been negative for nephrolithiasis in the past. She does demonstrate persistent microscopic hematuria today. We will send her urine for culture. Also plan to treat for presumptive UTI. She has persistent negative cultures in the face of microscopic hematuria will need to refer her to urology. Hydrocodone for relief. We'll obtain UDS to ensure she is not taking anything else.

## 2011-06-08 NOTE — Progress Notes (Signed)
The patient is a 46 year old gravida 3 para 3 (2 normal spontaneous vaginal deliveries 1 cesarean section) with prior tubal ligation. Patient presented to the office today with a complaint of dysmenorrhea, menorrhagia, dyspareunia, and low pelvic pain. She feels that she has to urinate frequently and i pain is relieved after urination. The pain is mostly suprapubic. She has been evaluated by her family physician as a result of her hematuria and recently she was placed on Keflex which she's currently taking for 10 day course. She is currently on her third day of her menstrual cycle. As part of her evaluation she had a normal abdominal pelvic CT scan a month ago. She was in the process of being sent to a urologist for further evaluation if the hematuria does not clear up or symptoms do not improve.  Patient recently had an STD screening done by her primary physician to include the following which were negative: GC and Chlamydia culture, HIV, RPR, and hepatitis B and C. patient is a chronic smoker has been smoking since the age of 14 ( over a half a pack cigarette daily). Normal Pap smear in July 2012.  Exam: Back: No CVA tenderness Abdomen: Soft tender especially suprapubic Pelvic: Bartholin urethra Skene was within normal limits Vagina: Some menstrual blood was present Cervix: No lesions or discharge Uterus/adnexa limited due to patient's apprehension and discomfort level Rectal: Not done  Urinalysis a rare bacteria and previous 6 RBC and very little white blood cells. Urine sent for culture  Ultrasound was undertaken for better assessment of the uterus and adnexa with following findings:  Uterus measured 8.9 x 5.4 x 4.4 cm with an endometrial stripe is 7 mm. Normal ovaries bilateral and no free fluid in the cul-de-sac.  Assessment/plan: Patient's symptomatology makes it highly suspicious for interstitial cystitis. She will complete her treatment prescribed by Dr. Gwendolyn Grant for suspected cystitis. I  have given her sample of Uribell (antispasmodic agent) to take 1 by mouth 4 times a day for the next 2 days. If she continues to have hematuria despite being treated for suspected UTI/cystitis she will need to be referred to the urologist for further evaluation such as with cystoscopy and possible bladder biopsy to rule out not only interstitial cystitis but transitional cell carcinoma of the bladder because of her chronic smoking history since the age of 86. After all the above evaluation is completed and patient continues to have pelvic pain we discussed about possibly proceeding with a diagnostic laparoscopy to rule out endometriosis. Literature information was provided all the above subject. We'll for a copy of this office note to Dr. Payton Mccallum her primary physician.

## 2011-06-08 NOTE — Assessment & Plan Note (Signed)
Acutely worsened. She has obvious reasons for worsening of her depression. She has followup appointment with mood disorder clinic tomorrow. I rather them adjust her medications rather than prescribe her something just for tonight. I will see her back after her appointment with mood disorder clinic tomorrow.

## 2011-06-08 NOTE — Patient Instructions (Addendum)
Interstitial Cystitis Interstitial cystitis (IC) is a condition that results in discomfort or pain in the bladder and the surrounding pelvic region. The symptoms can be different from case to case and even in the same individual. People may experience:  Mild discomfort.   Pressure.   Tenderness.   Intense pain in the bladder and pelvic area.  CAUSES  Because IC varies so much in symptoms and severity, people studying this disease believe it is not one but several diseases. Some caregivers use the term painful bladder syndrome (PBS) to describe cases with painful urinary symptoms. This may not meet the strictest definition of IC. The term IC / PBS includes all cases of urinary pain that cannot be connected to other causes, such as infection or urinary stones.  SYMPTOMS  Symptoms may include:  An urgent need to urinate.   A frequent need to urinate.   A combination of these symptoms.  Pain may change in intensity as the bladder fills with urine or as it empties. Women's symptoms often get worse during menstruation. They may sometimes experience pain with vaginal intercourse. Some of the symptoms of IC / PBS seem like those of bacterial infection. Tests do not show infection. IC / PBS is far more common in women than in men.  DIAGNOSIS  The diagnosis of IC / PBS is based on:  Presence of pain related to the bladder, usually along with problems of frequency and urgency.   Not finding other diseases that could cause the symptoms.   Diagnostic tests that help rule out other diseases include:   Urinalysis.   Urine culture.   Cystoscopy.   Biopsy of the bladder wall.   Distension of the bladder under anesthesia.   Urine cytology.   Laboratory examination of prostate secretions.  A biopsy is a tissue sample that can be looked at under a microscope. Samples of the bladder and urethra may be removed during a cystoscopy. A biopsy helps rule out bladder cancer. TREATMENT  Scientists  have not yet found a cure for IC / PBS. Patients with IC / PBS do not get better with antibiotic therapy. Caregivers cannot predict who will respond best to which treatment. Symptoms may disappear without explanation. Disappearing symptoms may coincide with an event such as a change in diet or treatment. Even when symptoms disappear, they may return after days, weeks, months, or years.  Because the causes of IC / PBS are unknown, current treatments are aimed at relieving symptoms. Many people are helped by one or a combination of the treatments. As researchers learn more about IC / PBS, the list of potential treatments will change. Patients should discuss their options with a caregiver. SURGERY  Surgery should be considered only if all available treatments have failed and the pain is disabling. Many approaches and techniques are used. Each approach has its own advantages and complications. Advantages and complications should be discussed with a urologist. Your caregiver may recommend consulting another urologist for a second opinion. Most caregivers are reluctant to operate because the outcome is unpredictable. Some people still have symptoms after surgery.   People considering surgery should discuss the potential risks and benefits, side effects, and long- and short-term complications with their family, as well as with people who have already had the procedure. Surgery requires anesthesia, hospitalization, and in some cases weeks or months of recovery. As the complexity of the procedure increases, so do the chances for complications and for failure.  HOME CARE INSTRUCTIONS   All   drugs, even those sold over the counter, have side effects. Patients should always consult a caregiver before using any drug for an extended amount of time. Only take over-the-counter or prescription medicines for pain, discomfort, or fever as directed by your caregiver.   Many patients feel that smoking makes their symptoms  worse. How the by-products of tobacco that are excreted in the urine affect IC / PBS is unknown. Smoking is the major known cause of bladder cancer. One of the best things smokers can do for their bladder and their overall health is to quit.   Many patients feel that gentle stretching exercises help relieve IC / PBS symptoms.   Methods vary, but basically patients decide to empty their bladder at designated times and use relaxation techniques and distractions to keep to the schedule. Gradually, patients try to lengthen the time between scheduled voids. A diary in which to record voiding times is usually helpful in keeping track of progress.  MAKE SURE YOU:   Understand these instructions.   Will watch your condition.   Will get help right away if you are not doing well or get worse.  Document Released: 11/18/2003 Document Revised: 03/08/2011 Document Reviewed: 11/02/2009Diagnostic Laparoscopy Laparoscopy is a surgical procedure. It is used to diagnose and treat diseases inside the belly(abdomen). It is usually a brief, common, and relatively simple procedure. The laparoscopeis a thin, lighted, pencil-sized instrument. It is like a telescope. It is inserted into your abdomen through a small cut (incision). Your caregiver can look at the organs inside your body through this instrument. He or she can see if there is anything abnormal. Laparoscopy can be done either in a hospital or outpatient clinic. You may be given a mild sedative to help you relax before the procedure. Once in the operating room, you will be given a drug to make you sleep (general anesthesia). Laparoscopy usually lasts less than 1 hour. After the procedure, you will be monitored in a recovery area until you are stable and doing well. Once you are home, it will take 2 to 3 days to fully recover. RISKS AND COMPLICATIONS  Laparoscopy has relatively few risks. Your caregiver will discuss the risks with you before the procedure. Some  problems that can occur include: Infection.  Bleeding.  Damage to other organs.  Anesthetic side effects.  PROCEDURE Once you receive anesthesia, your surgeon inflates the abdomen with a harmless gas (carbon dioxide). This makes the organs easier to see. The laparoscope is inserted into the abdomen through a small incision. This allows your surgeon to see into the abdomen. Other small instruments are also inserted into the abdomen through other small openings. Many surgeons attach a video camera to the laparoscope to enlarge the view. During a diagnostic laparoscopy, the surgeon may be looking for inflammation, infection, or cancer. Your surgeon may take tissue samples(biopsies). The samples are sent to a specialist in looking at cells and tissue samples (pathologist). The pathologist examines them under a microscope. Biopsies can help to diagnose or confirm a disease. AFTER THE PROCEDURE  The gas is released from inside the abdomen.  The incisions are closed with stitches (sutures). Because these incisions are small (usually less than 1/2 inch), there is usually minimal discomfort after the procedure. There may be some mild discomfort in the throat. This is from the tube placed in the throat while you were sleeping. You may have some mild abdominal discomfort. There may also be discomfort from the instrument placement incisions in the abdomen.  The recovery time is shortened as long as there are no complications.  You will rest in a recovery room until stable and doing well. As long as there are no complications, you may be allowed to go home.  FINDING OUT THE RESULTS OF YOUR TEST Not all test results are available during your visit. If your test results are not back during the visit, make an appointment with your caregiver to find out the results. Do not assume everything is normal if you have not heard from your caregiver or the medical facility. It is important for you to follow up on all of your  test results. HOME CARE INSTRUCTIONS  Take all medicines as directed.  Only take over-the-counter or prescription medicines for pain, discomfort, or fever as directed by your caregiver.  Resume daily activities as directed.  Showers are preferred over baths.  You may resume sexual activities in 1 week or as directed.  Do not drive while taking narcotics.  SEEK MEDICAL CARE IF:  There is increasing abdominal pain.  There is new pain in the shoulders (shoulder strap areas).  You feel lightheaded or faint.  You have the chills.  You or your child has an oral temperature above 102 F (38.9 C).  There is pus-like (purulent) drainage from any of the wounds.  You are unable to pass gas or have a bowel movement.  You feel sick to your stomach (nauseous) or throw up (vomit).  MAKE SURE YOU:  Understand these instructions.  Will watch your condition.  Will get help right away if you are not doing well or get worse.  Document Released: 06/25/2000 Document Revised: 03/08/2011 Document Reviewed: 03/19/2007 [facility] Patient Information 2012 [place], [place]. [employer] Patient Information 2012 [place], [place].  Endometriosis Endometriosis is a disease that occurs when the endometrium (lining of the uterus) is misplaced outside of its normal location. It may occur in many locations close to the uterus (womb), but commonly on the ovaries, fallopian tubes, vagina (birth canal) and bowel located close to the uterus. Because the uterus sloughs (expels) its lining every month (menses), there is bleeding whereever the endometrial tissue is located. SYMPTOMS  Often there are no symptoms. However, because blood is irritating to tissues not normally exposed to it, when symptoms occur they vary with the location of the misplaced endometrium. Symptoms often include back and abdominal pain. Periods may be heavier and intercourse may be painful. Infertility may be present. You may have all of these symptoms at one  time or another or you may have months with no symptoms at all. Although the symptoms occur mainly during menses, they can occur mid-cycle as well, and usually terminate with menopause. DIAGNOSIS  Your caregiver may recommend a blood test and urine test (urinalysis) to help rule out other conditions. Another common test is ultrasound, a painless procedure that uses sound waves to make a sonogram "picture" of abnormal tissue that could be endometriosis. If your bowel movements are painful around your periods, your caregiver may advise a barium enema (an X-ray of the lower bowel), to try to find the source of your pain. This is sometimes confirmed by laparoscopy. Laparoscopy is a procedure where your caregiver looks into your abdomen with a laparoscope (a small pencil sized telescope). Your caregiver may take a tiny piece of tissue (biopsy) from any abnormal tissue to confirm or document your problem. These tissues are sent to the lab and a pathologist looks at them under the microscope to give a microscopic diagnosis.  TREATMENT  Once the diagnosis is made, it can be treated by destruction of the misplaced endometrial tissue using heat (diathermy), laser, cutting (excision), or chemical means. It may also be treated with hormonal therapy. When using hormonal therapy menses are eliminated, therefore eliminating the monthly exposure to blood by the misplaced endometrial tissue. Only in severe cases is it necessary to perform a hysterectomy with removal of the tubes, uterus and ovaries. HOME CARE INSTRUCTIONS   Only take over-the-counter or prescription medicines for pain, discomfort, or fever as directed by your caregiver.   Avoid activities that produce pain, including a physical sexual relationship.   Do not take aspirin as this may increase bleeding when not on hormonal therapy.   See your caregiver for pain or problems not controlled with treatment.  SEEK IMMEDIATE MEDICAL CARE IF:   Your pain is  severe and is not responding to pain medicine.   You develop severe nausea and vomiting, or you cannot keep foods down.   Your pain localizes to the right lower part of your abdomen (possible appendicitis).   You have swelling or increasing pain in the abdomen.   You have a fever.   You see blood in your stool.  MAKE SURE YOU:   Understand these instructions.   Will watch your condition.   Will get help right away if you are not doing well or get worse.  Document Released: 03/16/2000 Document Revised: 03/08/2011 Document Reviewed: 11/05/2007 [facility] Patient Information 2012 [place], [place].  Smoking Cessation This document explains the best ways for you to quit smoking and new treatments to help. It lists new medicines that can double or triple your chances of quitting and quitting for good. It also considers ways to avoid relapses and concerns you may have about quitting, including weight gain. NICOTINE: A POWERFUL ADDICTION If you have tried to quit smoking, you know how hard it can be. It is hard because nicotine is a very addictive drug. For some people, it can be as addictive as heroin or cocaine. Usually, people make 2 or 3 tries, or more, before finally being able to quit. Each time you try to quit, you can learn about what helps and what hurts. Quitting takes hard work and a lot of effort, but you can quit smoking. QUITTING SMOKING IS ONE OF THE MOST IMPORTANT THINGS YOU WILL EVER DO.  You will live longer, feel better, and live better.   The impact on your body of quitting smoking is felt almost immediately:   Within 20 minutes, blood pressure decreases. Pulse returns to its normal level.   After 8 hours, carbon monoxide levels in the blood return to normal. Oxygen level increases.   After 24 hours, chance of heart attack starts to decrease. Breath, hair, and body stop smelling like smoke.   After 48 hours, damaged nerve endings begin to recover. Sense of taste and  smell improve.   After 72 hours, the body is virtually free of nicotine. Bronchial tubes relax and breathing becomes easier.   After 2 to 12 weeks, lungs can hold more air. Exercise becomes easier and circulation improves.   Quitting will reduce your risk of having a heart attack, stroke, cancer, or lung disease:   After 1 year, the risk of coronary heart disease is cut in half.   After 5 years, the risk of stroke falls to the same as a nonsmoker.   After 10 years, the risk of lung cancer is cut in half and the  risk of other cancers decreases significantly.   After 15 years, the risk of coronary heart disease drops, usually to the level of a nonsmoker.   If you are pregnant, quitting smoking will improve your chances of having a healthy baby.   The people you live with, especially your children, will be healthier.   You will have extra money to spend on things other than cigarettes.  FIVE KEYS TO QUITTING Studies have shown that these 5 steps will help you quit smoking and quit for good. You have the best chances of quitting if you use them together: 1. Get ready.  2. Get support and encouragement.  3. Learn new skills and behaviors.  4. Get medicine to reduce your nicotine addiction and use it correctly.  5. Be prepared for relapse or difficult situations. Be determined to continue trying to quit, even if you do not succeed at first.  1. GET READY  Set a quit date.   Change your environment.   Get rid of ALL cigarettes, ashtrays, matches, and lighters in your home, car, and place of work.   Do not let people smoke in your home.   Review your past attempts to quit. Think about what worked and what did not.   Once you quit, do not smoke. NOT EVEN A PUFF!  2. GET SUPPORT AND ENCOURAGEMENT Studies have shown that you have a better chance of being successful if you have help. You can get support in many ways.  Tell your family, friends, and coworkers that you are going to quit  and need their support. Ask them not to smoke around you.   Talk to your caregivers (doctor, dentist, nurse, pharmacist, psychologist, and/or smoking counselor).   Get individual, group, or telephone counseling and support. The more counseling you have, the better your chances are of quitting. Programs are available at [employer] and health centers. Call your local health department for information about programs in your area.   Spiritual beliefs and practices may help some smokers quit.   Quit meters are Photographer that keep track of quit statistics, such as amount of "quit-time," cigarettes not smoked, and money saved.   Many smokers find one or more of the many self-help books available useful in helping them quit and stay off tobacco.  3. LEARN NEW SKILLS AND BEHAVIORS  Try to distract yourself from urges to smoke. Talk to someone, go for a walk, or occupy your time with a task.   When you first try to quit, change your routine. Take a different route to work. Drink tea instead of coffee. Eat breakfast in a different place.   Do something to reduce your stress. Take a hot bath, exercise, or read a book.   Plan something enjoyable to do every day. Reward yourself for not smoking.   Explore interactive web-based programs that specialize in helping you quit.  4. GET MEDICINE AND USE IT CORRECTLY Medicines can help you stop smoking and decrease the urge to smoke. Combining medicine with the above behavioral methods and support can quadruple your chances of successfully quitting smoking. The [place] Food and Drug Administration (FDA) has approved 7 medicines to help you quit smoking. These medicines fall into 3 categories.  Nicotine replacement therapy (delivers nicotine to your body without the negative effects and risks of smoking):   Nicotine gum: Available over-the-counter.   Nicotine lozenges: Available over-the-counter.   Nicotine inhaler:  Available by prescription.   Nicotine nasal  spray: Available by prescription.   Nicotine skin patches (transdermal): Available by prescription and over-the-counter.   Antidepressant medicine (helps people abstain from smoking, but how this works is unknown):   Bupropion sustained-release (SR) tablets: Available by prescription.   Nicotinic receptor partial agonist (simulates the effect of nicotine in your brain):   Varenicline tartrate tablets: Available by prescription.   Ask your caregiver for advice about which medicines to use and how to use them. Carefully read the information on the package.   Everyone who is trying to quit may benefit from using a medicine. If you are pregnant or trying to become pregnant, nursing an infant, you are under age 105, or you smoke fewer than 10 cigarettes per day, talk to your caregiver before taking any nicotine replacement medicines.   You should stop using a nicotine replacement product and call your caregiver if you experience nausea, dizziness, weakness, vomiting, fast or irregular heartbeat, mouth problems with the lozenge or gum, or redness or swelling of the skin around the patch that does not go away.   Do not use any other product containing nicotine while using a nicotine replacement product.   Talk to your caregiver before using these products if you have diabetes, heart disease, asthma, stomach ulcers, you had a recent heart attack, you have high blood pressure that is not controlled with medicine, a history of irregular heartbeat, or you have been prescribed medicine to help you quit smoking.  5. BE PREPARED FOR RELAPSE OR DIFFICULT SITUATIONS  Most relapses occur within the first 3 months after quitting. Do not be discouraged if you start smoking again. Remember, most people try several times before they finally quit.   You may have symptoms of withdrawal because your body is used to nicotine. You may crave cigarettes, be irritable, feel  very hungry, cough often, get headaches, or have difficulty concentrating.   The withdrawal symptoms are only temporary. They are strongest when you first quit, but they will go away within 10 to 14 days.  Here are some difficult situations to watch for:  Alcohol. Avoid drinking alcohol. Drinking lowers your chances of successfully quitting.   Caffeine. Try to reduce the amount of caffeine you consume. It also lowers your chances of successfully quitting.   Other smokers. Being around smoking can make you want to smoke. Avoid smokers.   Weight gain. Many smokers will gain weight when they quit, usually less than 10 pounds. Eat a healthy diet and stay active. Do not let weight gain distract you from your main goal, quitting smoking. Some medicines that help you quit smoking may also help delay weight gain. You can always lose the weight gained after you quit.   Bad mood or depression. There are a lot of ways to improve your mood other than smoking.  If you are having problems with any of these situations, talk to your caregiver. SPECIAL SITUATIONS AND CONDITIONS Studies suggest that everyone can quit smoking. Your situation or condition can give you a special reason to quit.  Pregnant women/new mothers: By quitting, you protect your baby's health and your own.   Hospitalized patients: By quitting, you reduce health problems and help healing.   Heart attack patients: By quitting, you reduce your risk of a second heart attack.   Lung, head, and neck cancer patients: By quitting, you reduce your chance of a second cancer.   Parents of children and adolescents: By quitting, you protect your children from illnesses caused by secondhand smoke.  QUESTIONS TO THINK ABOUT Think about the following questions before you try to stop smoking. You may want to talk about your answers with your caregiver.  Why do you want to quit?   If you tried to quit in the past, what helped and what did not?    What will be the most difficult situations for you after you quit? How will you plan to handle them?   Who can help you through the tough times? Your family? Friends? Caregiver?   What pleasures do you get from smoking? What ways can you still get pleasure if you quit?  Here are some questions to ask your caregiver:  How can you help me to be successful at quitting?   What medicine do you think would be best for me and how should I take it?   What should I do if I need more help?   What is smoking withdrawal like? How can I get information on withdrawal?  Quitting takes hard work and a lot of effort, but you can quit smoking. FOR MORE INFORMATION  Smokefree.gov (http://www.davis-sullivan.com/) provides free, accurate, evidence-based information and professional assistance to help support the immediate and long-term needs of people trying to quit smoking. Document Released: 03/13/2001 Document Revised: 03/08/2011 Document Reviewed: 01/03/2009 [facility] Patient Information 2012 [place], [place].

## 2011-06-09 LAB — CBC WITH DIFFERENTIAL/PLATELET
Basophils Absolute: 0 10*3/uL (ref 0.0–0.1)
Eosinophils Relative: 2 % (ref 0–5)
Lymphocytes Relative: 36 % (ref 12–46)
Lymphs Abs: 1.5 10*3/uL (ref 0.7–4.0)
Neutro Abs: 1.9 10*3/uL (ref 1.7–7.7)
Neutrophils Relative %: 46 % (ref 43–77)
Platelets: 217 10*3/uL (ref 150–400)
RBC: 4.58 MIL/uL (ref 3.87–5.11)
RDW: 13.3 % (ref 11.5–15.5)
WBC: 4.2 10*3/uL (ref 4.0–10.5)

## 2011-06-10 LAB — URINE CULTURE: Colony Count: NO GROWTH

## 2011-06-11 ENCOUNTER — Telehealth: Payer: Self-pay | Admitting: Family Medicine

## 2011-06-11 NOTE — Telephone Encounter (Signed)
appt made for Friday @ 945 am.Linda Cervantes, Linda Cervantes

## 2011-06-11 NOTE — Telephone Encounter (Signed)
Patient is wanting to speak to Dr. Gwendolyn Grant because after the [place], she was told that she may have Bladder Cancer or Endometriosis.

## 2011-06-11 NOTE — Telephone Encounter (Signed)
Forwarded to pcp.Aryn Kops Lynetta  

## 2011-06-11 NOTE — Telephone Encounter (Signed)
She needs an appt.  We need to discuss not just this but possible referral to Urology as well as her recent mood disorder clinic appt.

## 2011-06-15 ENCOUNTER — Encounter: Payer: Self-pay | Admitting: Family Medicine

## 2011-06-15 ENCOUNTER — Ambulatory Visit (INDEPENDENT_AMBULATORY_CARE_PROVIDER_SITE_OTHER): Payer: BC Managed Care – PPO | Admitting: Family Medicine

## 2011-06-15 VITALS — BP 116/68 | HR 80 | Temp 98.5°F | Ht 62.0 in | Wt 131.0 lb

## 2011-06-15 DIAGNOSIS — R300 Dysuria: Secondary | ICD-10-CM

## 2011-06-15 DIAGNOSIS — G4700 Insomnia, unspecified: Secondary | ICD-10-CM

## 2011-06-15 DIAGNOSIS — R3129 Other microscopic hematuria: Secondary | ICD-10-CM

## 2011-06-15 DIAGNOSIS — F329 Major depressive disorder, single episode, unspecified: Secondary | ICD-10-CM

## 2011-06-15 DIAGNOSIS — R102 Pelvic and perineal pain: Secondary | ICD-10-CM

## 2011-06-15 DIAGNOSIS — N949 Unspecified condition associated with female genital organs and menstrual cycle: Secondary | ICD-10-CM

## 2011-06-15 LAB — POCT URINALYSIS DIPSTICK
Glucose, UA: NEGATIVE
Leukocytes, UA: NEGATIVE
Nitrite, UA: NEGATIVE
Spec Grav, UA: 1.025
Urobilinogen, UA: 0.2
pH, UA: 6

## 2011-06-15 MED ORDER — HYDROCODONE-ACETAMINOPHEN 10-325 MG PO TABS: 1.0000 | ORAL_TABLET | Freq: Three times a day (TID) | ORAL | Status: DC | PRN

## 2011-06-15 NOTE — Patient Instructions (Signed)
We will get you set up for urology today. We'll stay on the current Seroquel and Zoloft doses.   Come back and see me after you've seen the Urologist.

## 2011-06-18 ENCOUNTER — Ambulatory Visit: Payer: No Typology Code available for payment source | Admitting: Family Medicine

## 2011-06-18 DIAGNOSIS — R3129 Other microscopic hematuria: Secondary | ICD-10-CM | POA: Insufficient documentation

## 2011-06-18 NOTE — Assessment & Plan Note (Signed)
Much improved with Seroquel. Appreciate help from the disorder clinic.

## 2011-06-18 NOTE — Assessment & Plan Note (Signed)
Patient does state she has had some improvement in pelvic pain since prescription of Uribel. Provided her with another hydrocodone prescription today and did discuss that we will start tapering these medications. Likely interstitial cystitis. We'll refer to urology for microscopic hematuria as well. She may eventually have laparoscopy for endometriosis per OB/GYN. Appreciate input.

## 2011-06-18 NOTE — Progress Notes (Signed)
  Subjective:    Patient ID: Linda Cervantes, female    DOB: 04/21/1965, 46 y.o.   MRN: [id number]  HPI #1. Followup for pelvic pain: Patient seen by obstetrics and gynecology last week. She states that she was told pain is likely secondary to interstitial cystitis however she needs referral to urology to rule out persistent microscopic hematuria. Patient very concerned she may have bladder cancer. She states she still has pelvic pain. This controlled with hydrocodone. No microscopic blood. Dysuria persists but denies any burning or itching. No vaginal discharge. No nausea or vomiting. Describes pelvic pain as bilateral lower quadrant aching and cramping.  #2. Followup for mood disorder clinic: Patient seen in the disorder clinic last week as well. She was placed on increased dose of Zoloft 100 mg as well as Seroquel 100 mg. She states she has noted positive improvement in both mood and sleeping at night with these medications. No longer having trouble with insomnia. Depression is much improved. Husband is out of jail currently but has not contacted her and she feels safe in her current situation.  The following portions of the patient's history were reviewed and updated as appropriate: allergies, current medications, past medical history, family and social history, and problem list.  Patient is a current smoker.  Review of Systems See HPI above for review of systems.       Objective:   Physical Exam Gen:  Alert, cooperative patient who appears stated age in no acute distress.  Vital signs reviewed. Mouth: MMM Neck:  No thyromegaly Cardiac:  Regular rate and rhythm without murmur auscultated.  Good S1/S2. Pulm:  Clear to auscultation bilaterally with good air movement.  No wheezes or rales noted.   Abdomen:  Soft and nondistended. Mildly tender to palpation bilateral lower quadrants as well as suprapubic. No guarding or rebound. Good bowel sounds throughout. Extremities: Lower extremity  edema Psych: Not tearful. Slightly anxious but not depressed appearing thought process is linear.       Assessment & Plan:

## 2011-06-18 NOTE — Assessment & Plan Note (Signed)
Much improved with increase Zoloft. We will continue with this current dose. She is following up with me in 2 weeks.

## 2011-06-19 ENCOUNTER — Telehealth: Payer: Self-pay | Admitting: Family Medicine

## 2011-06-19 NOTE — Telephone Encounter (Signed)
Patient is calling because she hasn't heard anything on the referral for the Urologist yet.  Please call her.

## 2011-06-19 NOTE — Telephone Encounter (Signed)
Called and told pt that her information has been sent into [facility] and they will contact her regarding her appt.Laureen Ochs, Viann Shove

## 2011-06-23 ENCOUNTER — Emergency Department (HOSPITAL_COMMUNITY)
Admission: EM | Admit: 2011-06-23 | Discharge: 2011-06-23 | Disposition: A | Discharge status: Home / Self Care | Payer: BC Managed Care – PPO | Attending: Emergency Medicine | Admitting: Emergency Medicine

## 2011-06-23 ENCOUNTER — Encounter (HOSPITAL_COMMUNITY): Payer: Self-pay | Admitting: Emergency Medicine

## 2011-06-23 DIAGNOSIS — K219 Gastro-esophageal reflux disease without esophagitis: Secondary | ICD-10-CM | POA: Insufficient documentation

## 2011-06-23 DIAGNOSIS — R109 Unspecified abdominal pain: Secondary | ICD-10-CM | POA: Insufficient documentation

## 2011-06-23 DIAGNOSIS — Z79899 Other long term (current) drug therapy: Secondary | ICD-10-CM | POA: Insufficient documentation

## 2011-06-23 DIAGNOSIS — F341 Dysthymic disorder: Secondary | ICD-10-CM | POA: Insufficient documentation

## 2011-06-23 DIAGNOSIS — R10819 Abdominal tenderness, unspecified site: Secondary | ICD-10-CM | POA: Insufficient documentation

## 2011-06-23 HISTORY — DX: Dorsalgia, unspecified: M54.9

## 2011-06-23 LAB — URINALYSIS, ROUTINE W REFLEX MICROSCOPIC
Bilirubin Urine: NEGATIVE
Glucose, UA: NEGATIVE mg/dL
Ketones, ur: NEGATIVE mg/dL
Nitrite: NEGATIVE
Protein, ur: NEGATIVE mg/dL
pH: 6 (ref 5.0–8.0)

## 2011-06-23 LAB — WET PREP, GENITAL: Trich, Wet Prep: NONE SEEN

## 2011-06-23 MED ORDER — OXYCODONE-ACETAMINOPHEN 5-325 MG PO TABS
2.0000 | ORAL_TABLET | Freq: Once | ORAL | Status: AC
  Administered 2011-06-23: 2 via ORAL
  Filled 2011-06-23: qty 2

## 2011-06-23 MED ORDER — ONDANSETRON HCL 4 MG/2ML IJ SOLN
4.0000 mg | Freq: Once | INTRAMUSCULAR | Status: AC
  Administered 2011-06-23: 4 mg via INTRAVENOUS
  Filled 2011-06-23: qty 2

## 2011-06-23 MED ORDER — MORPHINE SULFATE 4 MG/ML IJ SOLN
4.0000 mg | Freq: Once | INTRAMUSCULAR | Status: AC
  Administered 2011-06-23: 4 mg via INTRAVENOUS
  Filled 2011-06-23: qty 1

## 2011-06-23 MED ORDER — HYDROCODONE-ACETAMINOPHEN 5-325 MG PO TABS: 2.0000 | ORAL_TABLET | ORAL | Status: DC | PRN

## 2011-06-23 NOTE — ED Notes (Signed)
Pt. Stated, I started having lower abd. Pain and back pain for 12 monts , I took [employer] and its just not working. I see Dr. Gwendolyn [facility]

## 2011-06-23 NOTE — ED Notes (Signed)
Pt medicated for pain as ordered since pain is not better.

## 2011-06-23 NOTE — Discharge Instructions (Signed)
Abdominal Pain Abdominal pain can be caused by many things. Your caregiver decides the seriousness of your pain by an examination and possibly blood tests and X-rays. Many cases can be observed and treated at home. Most abdominal pain is not caused by a disease and will probably improve without treatment. However, in many cases, more time must pass before a clear cause of the pain can be found. Before that point, it may not be known if you need more testing, or if hospitalization or surgery is needed. HOME CARE INSTRUCTIONS   Do not take laxatives unless directed by your caregiver.   Take pain medicine only as directed by your caregiver.   Only take over-the-counter or prescription medicines for pain, discomfort, or fever as directed by your caregiver.   Try a clear liquid diet (broth, tea, or water) for as long as directed by your caregiver. Slowly move to a bland diet as tolerated.  SEEK IMMEDIATE MEDICAL CARE IF:   The pain does not go away.   You have a fever.   You keep throwing up (vomiting).   The pain is felt only in portions of the abdomen. Pain in the right side could possibly be appendicitis. In an adult, pain in the left lower portion of the abdomen could be colitis or diverticulitis.   You pass bloody or black tarry stools.  MAKE SURE YOU:   Understand these instructions.   Will watch your condition.   Will get help right away if you are not doing well or get worse.  Document Released: 12/27/2004 Document Revised: 03/08/2011 Document Reviewed: 11/05/2007 [facility] Patient Information 2012 [place], [place].Abdominal Pain (Nonspecific) Your exam might not show the exact reason you have abdominal pain. Since there are many different causes of abdominal pain, another checkup and more tests may be needed. It is very important to follow up for lasting (persistent) or worsening symptoms. A possible cause of abdominal pain in any person who still has his or her appendix is acute  appendicitis. Appendicitis is often hard to diagnose. Normal blood tests, urine tests, ultrasound, and CT scans do not completely rule out early appendicitis or other causes of abdominal pain. Sometimes, only the changes that happen over time will allow appendicitis and other causes of abdominal pain to be determined. Other potential problems that may require surgery may also take time to become more apparent. Because of this, it is important that you follow all of the instructions below. HOME CARE INSTRUCTIONS   Rest as much as possible.   Do not eat solid food until your pain is gone.   While adults or children have pain: A diet of water, weak decaffeinated tea, broth or bouillon, gelatin, oral rehydration solutions (ORS), frozen ice pops, or ice chips may be helpful.   When pain is gone in adults or children: Start a light diet (dry toast, crackers, applesauce, or white rice). Increase the diet slowly as long as it does not bother you. Eat no dairy products (including cheese and eggs) and no spicy, fatty, fried, or high-fiber foods.   Use no alcohol, caffeine, or cigarettes.   Take your regular medicines unless your caregiver told you not to.   Take any prescribed medicine as directed.   Only take over-the-counter or prescription medicines for pain, discomfort, or fever as directed by your caregiver. Do not give aspirin to children.  If your caregiver has given you a follow-up appointment, it is very important to keep that appointment. Not keeping the appointment could  result in a permanent injury and/or lasting (chronic) pain and/or disability. If there is any problem keeping the appointment, you must call to reschedule.  SEEK IMMEDIATE MEDICAL CARE IF:   Your pain is not gone in 24 hours.   Your pain becomes worse, changes location, or feels different.   You or your child has an oral temperature above 102 F (38.9 C), not controlled by medicine.   Your baby is older than 3 months  with a rectal temperature of 102 F (38.9 C) or higher.   Your baby is 85 months old or younger with a rectal temperature of 100.4 F (38 C) or higher.   You have shaking chills.   You keep throwing up (vomiting) or cannot drink liquids.   There is blood in your vomit or you see blood in your bowel movements.   Your bowel movements become dark or black.   You have frequent bowel movements.   Your bowel movements stop (become blocked) or you cannot pass gas.   You have bloody, frequent, or painful urination.   You have yellow discoloration in the skin or whites of the eyes.   Your stomach becomes bloated or bigger.   You have dizziness or fainting.   You have chest or back pain.  MAKE SURE YOU:   Understand these instructions.   Will watch your condition.   Will get help right away if you are not doing well or get worse.  Document Released: 03/19/2005 Document Revised: 03/08/2011 Document Reviewed: 02/14/2009 [facility] Patient Information 2012 [place], [place].Pain of Unknown Etiology (Pain Without a Known Cause) You have come to your caregiver because of pain. Pain can occur in any part of the body. Often there is not a definite cause. If your laboratory (blood or urine) work was normal and x-rays or other studies were normal, your caregiver may treat you without knowing the cause of the pain. An example of this is the headache. Most headaches are diagnosed by taking a history. This means your caregiver asks you questions about your headaches. Your caregiver determines a treatment based on your answers. Usually testing done for headaches is normal. Often testing is not done unless there is no response to medications. Regardless of where your pain is located today, you can be given medications to make you comfortable. If no physical cause of pain can be found, most cases of pain will gradually leave as suddenly as they came.  If you have a painful condition and no reason can be found  for the pain, It is importantthat you follow up with your caregiver. If the pain becomes worse or does not go away, it may be necessary to repeat tests and look further for a possible cause.  Only take over-the-counter or prescription medicines for pain, discomfort, or fever as directed by your caregiver.   For the protection of your privacy, test results can not be given over the phone. Make sure you receive the results of your test. Ask as to how these results are to be obtained if you have not been informed. It is your responsibility to obtain your test results.   You may continue all activities unless the activities cause more pain. When the pain lessens, it is important to gradually resume normal activities. Resume activities by beginning slowly and gradually increasing the intensity and duration of the activities or exercise. During periods of severe pain, bed-rest may be helpful. Lay or sit in any position that is comfortable.   Ice  used for acute (sudden) conditions may be effective. Use a large plastic bag filled with ice and wrapped in a towel. This may provide pain relief.   See your caregiver for continued problems. They can help or refer you for exercises or physical therapy if necessary.  If you were given medications for your condition, do not drive, operate machinery or power tools, or sign legal documents for 24 hours. Do not drink alcohol, take sleeping pills, or take other medications that may interfere with treatment. See your caregiver immediately if you have pain that is becoming worse and not relieved by medications. Document Released: 12/12/2000 Document Revised: 03/08/2011 Document Reviewed: 03/19/2005 [facility] Patient Information 2012 [place], [place].Pain of Unknown Etiology (Pain Without a Known Cause) You have come to your caregiver because of pain. Pain can occur in any part of the body. Often there is not a definite cause. If your laboratory (blood or urine) work was  normal and x-rays or other studies were normal, your caregiver may treat you without knowing the cause of the pain. An example of this is the headache. Most headaches are diagnosed by taking a history. This means your caregiver asks you questions about your headaches. Your caregiver determines a treatment based on your answers. Usually testing done for headaches is normal. Often testing is not done unless there is no response to medications. Regardless of where your pain is located today, you can be given medications to make you comfortable. If no physical cause of pain can be found, most cases of pain will gradually leave as suddenly as they came.  If you have a painful condition and no reason can be found for the pain, It is importantthat you follow up with your caregiver. If the pain becomes worse or does not go away, it may be necessary to repeat tests and look further for a possible cause.  Only take over-the-counter or prescription medicines for pain, discomfort, or fever as directed by your caregiver.   For the protection of your privacy, test results can not be given over the phone. Make sure you receive the results of your test. Ask as to how these results are to be obtained if you have not been informed. It is your responsibility to obtain your test results.   You may continue all activities unless the activities cause more pain. When the pain lessens, it is important to gradually resume normal activities. Resume activities by beginning slowly and gradually increasing the intensity and duration of the activities or exercise. During periods of severe pain, bed-rest may be helpful. Lay or sit in any position that is comfortable.   Ice used for acute (sudden) conditions may be effective. Use a large plastic bag filled with ice and wrapped in a towel. This may provide pain relief.   See your caregiver for continued problems. They can help or refer you for exercises or physical therapy if necessary.    If you were given medications for your condition, do not drive, operate machinery or power tools, or sign legal documents for 24 hours. Do not drink alcohol, take sleeping pills, or take other medications that may interfere with treatment. See your caregiver immediately if you have pain that is becoming worse and not relieved by medications. Document Released: 12/12/2000 Document Revised: 03/08/2011 Document Reviewed: 03/19/2005 [facility] Patient Information 2012 [place], [place].

## 2011-06-23 NOTE — ED Provider Notes (Signed)
History     CSN: [id number]  Arrival date & time 06/23/11  1001   First MD Initiated Contact with Patient 06/23/11 1035      Chief Complaint  Patient presents with  . Abdominal Pain    (Consider location/radiation/quality/duration/timing/severity/associated sxs/prior treatment) HPI  Patient presents to the emergency department with complaints of lower abdominal pain. The patient has been having this problem for over a year and sees Dr. Gwendolyn Grant from family practice. She's seen him within this past week and recently got a refill for her hydrocodone 10 mg. She states that this pain is the same as her usual pain. She has seen a gynecologist and has been recently referred to a urologist which she cannot see until April 2. The patient states that the hydrocodone normally works for her pain but this time it was not strong enough. She came to the ER for pain control. Patient denies nausea, vomiting, diarrhea, weakness, shortness of breath, pain in different from her normal pain.  Past Medical History  Diagnosis Date  . Anxiety   . Depression   . Substance abuse Clean as of 2009    Crack cocaine  . GERD (gastroesophageal reflux disease)   . Heart murmur   . Back pain     Past Surgical History  Procedure Date  . Cesarean section   . Tubal ligation 2000    Family History  Problem Relation Age of Onset  . Hypertension Father   . Diabetes Maternal Grandmother     History  Substance Use Topics  . Smoking status: Current Some Day Smoker -- 1.0 packs/day    Types: Cigarettes  . Smokeless tobacco: Never Used  . Alcohol Use: No    OB History    Grav Para Term Preterm Abortions TAB SAB Ect Mult Living   3 3 3       3       Review of Systems  All other systems reviewed and are negative.    Allergies  Review of patient's allergies indicates no known allergies.  Home Medications   Current Outpatient Rx  Name Route Sig Dispense Refill  . ALBUTEROL SULFATE HFA 108 (90 BASE)  MCG/ACT IN AERS Inhalation Inhale 2 puffs into the lungs every 6 (six) hours as needed. For shortness of breath     . ESOMEPRAZOLE MAGNESIUM 20 MG PO CPDR Oral Take 1 capsule (20 mg total) by mouth daily. 30 capsule 2  . HYDROCODONE-ACETAMINOPHEN 10-325 MG PO TABS Oral Take 1-1.5 tablets by mouth every 8 (eight) hours as needed. For pain    . QUETIAPINE FUMARATE 100 MG PO TABS Oral Take 1 tablet (100 mg total) by mouth at bedtime. 30 tablet 0    Per Dr. Kathrynn Running in [facility].  Marland Kitchen SERTRALINE HCL 100 MG PO TABS Oral Take 100 mg by mouth daily.    Marland Kitchen ZOLPIDEM TARTRATE 10 MG PO TABS Oral Take 1 tablet (10 mg total) by mouth at bedtime as needed. For insomnia 14 tablet 0  . HYDROCODONE-ACETAMINOPHEN 5-325 MG PO TABS Oral Take 2 tablets by mouth every 4 (four) hours as needed for pain. 10 tablet 0    BP 112/63  Pulse 64  Temp(Src) 98.3 F (36.8 C) (Oral)  Resp 18  SpO2 96%  LMP 06/05/2011  Physical Exam  Nursing note and vitals reviewed. Constitutional: She appears well-developed and well-nourished. No distress.  HENT:  Head: Normocephalic and atraumatic.  Eyes: Pupils are equal, round, and reactive to light.  Neck: Normal range  of motion. Neck supple.  Cardiovascular: Normal rate and regular rhythm.   Pulmonary/Chest: Effort normal.  Abdominal: Soft. Bowel sounds are normal. She exhibits no distension. There is tenderness (bilateral lower quadrant and suprapubic). There is no rebound and no guarding.  Genitourinary: Vagina normal. There is no rash or tenderness on the right labia. There is no rash or tenderness on the left labia. No erythema or bleeding around the vagina. No foreign body around the vagina. No vaginal discharge found.  Neurological: She is alert.  Skin: Skin is warm and dry. She is not diaphoretic.    ED Course  Procedures (including critical care time)  Labs Reviewed  WET PREP, GENITAL - Abnormal; Notable for the following:    Clue Cells Wet Prep HPF POC RARE (*)    WBC, Wet  Prep HPF POC RARE (*)    All other components within normal limits  URINALYSIS, ROUTINE W REFLEX MICROSCOPIC  POCT PREGNANCY, URINE  GC/CHLAMYDIA PROBE AMP, GENITAL   No results found.   1. Abdominal pain       MDM  Pt has been seen many times for this problem. She had a recent abdominal ultrasound done. She has seen gynecology and was recently referred to Urology who she will see April 2nd. Pelvic exam showed no abnormal findings. Pts pain controlled in ED and then she was referred back to her PCP and told to be sure to make it to her urologist. No pain Rx for pain medication given due to patient getting meds from Dr. Nehemiah Massed. Her next Rx can be filled this Monday.   Pt has been advised of the symptoms that warrant their return to the ED. Patient has voiced understanding and has agreed to follow-up with the PCP or specialist.       Dorthula Matas, PA 06/23/11 1404

## 2011-06-23 NOTE — ED Notes (Signed)
Pt received to [facility] with c/o abdominal pain which has been going on x 2 months but worse this morning. Pt claimed that the pain is dull, sharp that is constant. Pt also c/o nausea but no diarrhea nor fever.

## 2011-06-25 ENCOUNTER — Telehealth: Payer: Self-pay | Admitting: Family Medicine

## 2011-06-25 DIAGNOSIS — R102 Pelvic and perineal pain: Secondary | ICD-10-CM

## 2011-06-25 LAB — GC/CHLAMYDIA PROBE AMP, GENITAL
Chlamydia, DNA Probe: NEGATIVE
GC Probe Amp, Genital: NEGATIVE

## 2011-06-25 MED ORDER — ZOLPIDEM TARTRATE 10 MG PO TABS: 10.0000 mg | ORAL_TABLET | Freq: Every evening | ORAL | Status: DC | PRN

## 2011-06-25 NOTE — Telephone Encounter (Signed)
Will forward to Dr. Walden.  

## 2011-06-25 NOTE — Telephone Encounter (Signed)
Patient is calling again and is asking for you to call her.

## 2011-06-25 NOTE — Telephone Encounter (Signed)
Pt now has the orange card and has an appt for Urology with Dr Sherron Monday is on May 2 - not sure if this will be covered with him.  Needs to know what to do. Pt would like to be seen sooner if possible,

## 2011-06-25 NOTE — Telephone Encounter (Signed)
Per clinic policy cannot refill Hydrocodone with appt.  I will have Ambien faxed in.

## 2011-06-25 NOTE — Telephone Encounter (Signed)
Patient is calling because she said that [facility] on [address] was to send refill requests for her Hydrocodone and Ambien.  Those requests are not in Dr. Westly Pam box.  She said she ran out of the pain medicine so she had to go to the Hospital on Saturday and they gave her enough to last the weekend.  She has one dose left for today and needs the Rx called in today please.

## 2011-06-25 NOTE — Telephone Encounter (Signed)
Patient has scheduled an appt for tomorrow and is hoping for enough meds to last until then at least or she will end up going to the ER.

## 2011-06-25 NOTE — Telephone Encounter (Signed)
Spoke with patient and informed her that I will fax the urology referral to [facility], but that she still needs to keep the appointment for now in case she is unable to be approved for this through them. I am faxing this referral out to [facility] today and when get response I will then contact patient.  Also patient was wanting to know about a message earlier that was forwarded to Dr. Gwendolyn Grant. She is wanting her pain meds. I explained to her that this message was forwarded to doctor and that she may not get an answer until end of afternoon. Patient then said that she will call back about 3pm to get status of this. I explained th her that clinic does get busy and that she may not be able to speak with me or Tonya directly or right away about this.

## 2011-06-26 ENCOUNTER — Encounter: Payer: Self-pay | Admitting: Family Medicine

## 2011-06-26 ENCOUNTER — Other Ambulatory Visit (HOSPITAL_COMMUNITY)
Admission: RE | Admit: 2011-06-26 | Discharge: 2011-06-26 | Disposition: A | Discharge status: Home / Self Care | Payer: BC Managed Care – PPO | Source: Ambulatory Visit | Attending: Family Medicine | Admitting: Family Medicine

## 2011-06-26 ENCOUNTER — Ambulatory Visit (INDEPENDENT_AMBULATORY_CARE_PROVIDER_SITE_OTHER): Payer: No Typology Code available for payment source | Admitting: Family Medicine

## 2011-06-26 VITALS — BP 123/78 | HR 82 | Ht 63.0 in | Wt 136.0 lb

## 2011-06-26 DIAGNOSIS — N949 Unspecified condition associated with female genital organs and menstrual cycle: Secondary | ICD-10-CM

## 2011-06-26 DIAGNOSIS — R319 Hematuria, unspecified: Secondary | ICD-10-CM | POA: Insufficient documentation

## 2011-06-26 DIAGNOSIS — R102 Pelvic and perineal pain: Secondary | ICD-10-CM

## 2011-06-26 DIAGNOSIS — R3129 Other microscopic hematuria: Secondary | ICD-10-CM

## 2011-06-26 MED ORDER — HYDROCODONE-ACETAMINOPHEN 5-325 MG PO TABS: 1.0000 | ORAL_TABLET | ORAL | Status: DC | PRN

## 2011-06-26 MED ORDER — OXYBUTYNIN CHLORIDE 5 MG PO TABS: 5.0000 mg | ORAL_TABLET | Freq: Three times a day (TID) | ORAL | Status: DC

## 2011-06-26 NOTE — Patient Instructions (Signed)
We will call and get you set up to see [facility] in [facility] for urology.  At that point we will get you back to see your Ob/Gyn as long as everything looks good.    Come back and see me after you get back from your trip.

## 2011-06-26 NOTE — ED Provider Notes (Signed)
Medical screening examination/treatment/procedure(s) were performed by non-physician practitioner and as supervising physician I was immediately available for consultation/collaboration.   Forbes Cellar, MD 06/26/11 1718

## 2011-06-26 NOTE — Telephone Encounter (Signed)
Will see her in clinic today.  I did not get this message until late last night.

## 2011-06-27 ENCOUNTER — Telehealth: Payer: Self-pay | Admitting: Family Medicine

## 2011-06-27 DIAGNOSIS — K029 Dental caries, unspecified: Secondary | ICD-10-CM

## 2011-06-27 NOTE — Telephone Encounter (Signed)
Patient thinks she has an abscess and wants to go to [facility] but needs a referal.  The # is [phone].

## 2011-06-27 NOTE — Telephone Encounter (Signed)
Told pt that since she has insurance she can go anywhere she would like to have her dental needs met. She then informed me that she no longer has [facility] however she has [facility] now and she would like a dental referral to [facility] I will forward to pcp for advice.Loralee Pacas [place]

## 2011-06-28 NOTE — Assessment & Plan Note (Signed)
Plan to refer her for urology at [facility] since she no longer has health insurance.

## 2011-06-28 NOTE — Telephone Encounter (Signed)
Done

## 2011-06-28 NOTE — Progress Notes (Signed)
  Subjective:    Patient ID: Linda Cervantes, female    DOB: [birth year], 46 y.o.   MRN: [id number]  HPI  Followup for chronic pelvic pain: Patient continues to pain. She has had episodes where the pain has worsened to where she has had to go to the emergency room. She states hematocrit will relieve her pain. She is also very nervous that she may have cancer. She has lost her insurance and therefore has been placed on a waiting list to be seen by urology. Describes pain as aching and present every day. As above relieved with hydrocodone. Otherwise no relief. No fevers or chills. Dysuria continues. Dyspareunia continues as well.   Review of Systems See HPI above for review of systems.       Objective:   Physical Exam Gen:  Alert, cooperative patient who appears stated age in no acute distress.  Vital signs reviewed. Cardiac:  Regular rate and rhythm without murmur auscultated.  Good S1/S2. Pulm:  Clear to auscultation bilaterally with good air movement.  No wheezes or rales noted.   Abd:  Soft/nondistended/tender bilateral lower quadrants and suprapubically.  Good bowel sounds throughout all four quadrants.  No masses noted.  Ext:  No edema BL Psych:  Somewhat anxious appearing.  Linear thought process.         Assessment & Plan:

## 2011-06-28 NOTE — Assessment & Plan Note (Signed)
Still with unclear etiology. Possibly combination of interstitial cystitis and perhaps endometriosis. Please see above for hematuria. Did discuss that hydrocodone is not a long-term medication for her. We are going to start tapering medications at next visit.

## 2011-07-02 ENCOUNTER — Other Ambulatory Visit: Payer: Self-pay | Admitting: Family Medicine

## 2011-07-02 ENCOUNTER — Telehealth: Payer: Self-pay | Admitting: Family Medicine

## 2011-07-02 DIAGNOSIS — G4700 Insomnia, unspecified: Secondary | ICD-10-CM

## 2011-07-02 DIAGNOSIS — R102 Pelvic and perineal pain: Secondary | ICD-10-CM

## 2011-07-02 MED ORDER — ZOLPIDEM TARTRATE 10 MG PO TABS: 10.0000 mg | ORAL_TABLET | Freq: Every evening | ORAL | Status: DC | PRN

## 2011-07-02 NOTE — Telephone Encounter (Signed)
Refilled ambien for one month.

## 2011-07-03 ENCOUNTER — Telehealth: Payer: Self-pay | Admitting: *Deleted

## 2011-07-03 NOTE — Telephone Encounter (Addendum)
Received call from [place] at  [facility] regarding referral that was requested.  Needs to know if patient ever went by another name, because they have a patient with similar demographics.  I called patient and she states she will be moving to [place]  for a job and to move in with her father. She does not want to be referred to [facility] now . Instead wants referral to [facility].   Also she scheduled appointment with Dr. Gwendolyn Grant for 07/12/2011. ( first available)   States she thought  at last visit Dr.  Gwendolyn Grant was going to switch her pain med to Oxycodone but she got an RX for hydrocodone/Acetomenophen 5 /325 mg. with directions to take one  every 4 hours . One does not work for her and she has to take two .  She will be moving this weekend and needs RX before then . Will forward message to Dr. Rivka Safer to please advise.   Pharmacy is [facility].

## 2011-07-03 NOTE — Telephone Encounter (Signed)
Patient notified of message from Dr. Rivka Safer.   Advised patient that Dr. Gwendolyn Grant will not be back until Monday.   Will send message to Dr. Gwendolyn Grant upon his return for any further directions at patient's request.

## 2011-07-03 NOTE — Telephone Encounter (Signed)
I cannot prescribe any narcotic based pain medication. Dr. Tyson Alias last note was specific about weaning off this medication and keeping it short term. She just received a prescription for vicodin. The patient will have to come in for an appointment with Dr. Gwendolyn Grant as planned on the 11th.   Thank you,

## 2011-07-06 ENCOUNTER — Encounter: Payer: Self-pay | Admitting: Family Medicine

## 2011-07-06 ENCOUNTER — Telehealth: Payer: Self-pay | Admitting: Family Medicine

## 2011-07-06 ENCOUNTER — Emergency Department (HOSPITAL_COMMUNITY)
Admission: EM | Admit: 2011-07-06 | Discharge: 2011-07-07 | Disposition: A | Discharge status: Home / Self Care | Payer: Self-pay | Attending: Emergency Medicine | Admitting: Emergency Medicine

## 2011-07-06 ENCOUNTER — Ambulatory Visit (INDEPENDENT_AMBULATORY_CARE_PROVIDER_SITE_OTHER): Payer: BC Managed Care – PPO | Admitting: Family Medicine

## 2011-07-06 ENCOUNTER — Encounter (HOSPITAL_COMMUNITY): Payer: Self-pay | Admitting: *Deleted

## 2011-07-06 ENCOUNTER — Ambulatory Visit (HOSPITAL_COMMUNITY)
Admission: RE | Admit: 2011-07-06 | Discharge: 2011-07-06 | Disposition: A | Discharge status: Home / Self Care | Payer: BC Managed Care – PPO | Source: Ambulatory Visit | Attending: Family Medicine | Admitting: Family Medicine

## 2011-07-06 VITALS — BP 117/77 | HR 74 | Temp 98.4°F | Wt 135.6 lb

## 2011-07-06 DIAGNOSIS — M546 Pain in thoracic spine: Secondary | ICD-10-CM | POA: Insufficient documentation

## 2011-07-06 DIAGNOSIS — S46009A Unspecified injury of muscle(s) and tendon(s) of the rotator cuff of unspecified shoulder, initial encounter: Secondary | ICD-10-CM

## 2011-07-06 DIAGNOSIS — R52 Pain, unspecified: Secondary | ICD-10-CM | POA: Insufficient documentation

## 2011-07-06 DIAGNOSIS — M25519 Pain in unspecified shoulder: Secondary | ICD-10-CM | POA: Insufficient documentation

## 2011-07-06 DIAGNOSIS — S46002A Unspecified injury of muscle(s) and tendon(s) of the rotator cuff of left shoulder, initial encounter: Secondary | ICD-10-CM

## 2011-07-06 DIAGNOSIS — IMO0002 Reserved for concepts with insufficient information to code with codable children: Secondary | ICD-10-CM | POA: Insufficient documentation

## 2011-07-06 DIAGNOSIS — S4980XA Other specified injuries of shoulder and upper arm, unspecified arm, initial encounter: Secondary | ICD-10-CM

## 2011-07-06 MED ORDER — KETOROLAC TROMETHAMINE 60 MG/2ML IM SOLN
60.0000 mg | Freq: Once | INTRAMUSCULAR | Status: AC
  Administered 2011-07-06: 60 mg via INTRAMUSCULAR

## 2011-07-06 MED ORDER — HYDROCODONE-ACETAMINOPHEN 5-500 MG PO TABS: 1.0000 | ORAL_TABLET | Freq: Three times a day (TID) | ORAL | Status: DC | PRN

## 2011-07-06 MED ORDER — MELOXICAM 7.5 MG PO TABS: 7.5000 mg | ORAL_TABLET | Freq: Every day | ORAL | Status: DC

## 2011-07-06 MED ORDER — CYCLOBENZAPRINE HCL 10 MG PO TABS: 10.0000 mg | ORAL_TABLET | Freq: Three times a day (TID) | ORAL | Status: DC | PRN

## 2011-07-06 MED ORDER — METHYLPREDNISOLONE SODIUM SUCC 125 MG IJ SOLR
125.0000 mg | Freq: Once | INTRAMUSCULAR | Status: AC
  Administered 2011-07-06: 125 mg via INTRAMUSCULAR

## 2011-07-06 NOTE — Telephone Encounter (Signed)
Message copied by Dessa Phi on Fri Jul 06, 2011  4:39 PM ------      Message from: Tivis Ringer      Created: Fri Jul 06, 2011  4:05 PM                   ----- Message -----         From: Rad Results In Interface         Sent: 07/06/2011   3:59 PM           To: Sanjuana Letters, MD

## 2011-07-06 NOTE — Telephone Encounter (Signed)
Left VM. No shoulder fracture or dislocation on L shoulder x-ray.

## 2011-07-06 NOTE — Patient Instructions (Signed)
Linda Cervantes  For your shoulder I am concerned that you have a partial tear in your rotator cuff. For evaluation: x-rays For pain: mobic daily for next 1-2 weeks, vicodin prn for 3-4  days, flexeril prn for next 1-2  weeks.  For f/u: please follow-up at sports medicine clinic in one week.   I will call with the results of your x-ray. Please call if the pain worsens before then or you have decreased function.   Dr. Armen Pickup  .Shoulder Exercises EXERCISES  RANGE OF MOTION (ROM) AND STRETCHING EXERCISES These exercises may help you when beginning to rehabilitate your injury. Your symptoms may resolve with or without further involvement from your physician, physical therapist or athletic trainer. While completing these exercises, remember:   Restoring tissue flexibility helps normal motion to return to the joints. This allows healthier, less painful movement and activity.   An effective stretch should be held for at least 30 seconds.   A stretch should never be painful. You should only feel a gentle lengthening or release in the stretched tissue.  ROM - Pendulum  Bend at the waist so that your right / left arm falls away from your body. Support yourself with your opposite hand on a solid surface, such as a table or a countertop.   Your right / left arm should be perpendicular to the ground. If it is not perpendicular, you need to lean over farther. Relax the muscles in your right / left arm and shoulder as much as possible.   Gently sway your hips and trunk so they move your right / left arm without any use of your right / left shoulder muscles.   Progress your movements so that your right / left arm moves side to side, then forward and backward, and finally, both clockwise and counterclockwise.   Complete __________ repetitions in each direction. Many people use this exercise to relieve discomfort in their shoulder as well as to gain range of motion.  Repeat __________ times. Complete this  exercise __________ times per day. STRETCH - Flexion, Standing  Stand with good posture. With an underhand grip on your right / left hand and an overhand grip on the opposite hand, grasp a broomstick or cane so that your hands are a little more than shoulder-width apart.   Keeping your right / left elbow straight and shoulder muscles relaxed, push the stick with your opposite hand to raise your right / left arm in front of your body and then overhead. Raise your arm until you feel a stretch in your right / left shoulder, but before you have increased shoulder pain.   Try to avoid shrugging your right / left shoulder as your arm rises by keeping your shoulder blade tucked down and toward your mid-back spine. Hold __________ seconds.   Slowly return to the starting position.  Repeat __________ times. Complete this exercise __________ times per day. STRETCH - Internal Rotation  Place your right / left hand behind your back, palm-up.   Throw a towel or belt over your opposite shoulder. Grasp the towel/belt with your right / left hand.   While keeping an upright posture, gently pull up on the towel/belt until you feel a stretch in the front of your right / left shoulder.   Avoid shrugging your right / left shoulder as your arm rises by keeping your shoulder blade tucked down and toward your mid-back spine.   Hold __________. Release the stretch by lowering your opposite hand.  Repeat  __________ times. Complete this exercise __________ times per day. STRETCH - External Rotation and Abduction  Stagger your stance through a doorframe. It does not matter which foot is forward.   As instructed by your physician, physical therapist or athletic trainer, place your hands:   And forearms above your head and on the door frame.   And forearms at head-height and on the door frame.   At elbow-height and on the door frame.   Keeping your head and chest upright and your stomach muscles tight to  prevent over-extending your low-back, slowly shift your weight onto your front foot until you feel a stretch across your chest and/or in the front of your shoulders.   Hold __________ seconds. Shift your weight to your back foot to release the stretch.  Repeat __________ times. Complete this stretch __________ times per day.  STRENGTHENING EXERCISES  These exercises may help you when beginning to rehabilitate your injury. They may resolve your symptoms with or without further involvement from your physician, physical therapist or athletic trainer. While completing these exercises, remember:   Muscles can gain both the endurance and the strength needed for everyday activities through controlled exercises.   Complete these exercises as instructed by your physician, physical therapist or athletic trainer. Progress the resistance and repetitions only as guided.   You may experience muscle soreness or fatigue, but the pain or discomfort you are trying to eliminate should never worsen during these exercises. If this pain does worsen, stop and make certain you are following the directions exactly. If the pain is still present after adjustments, discontinue the exercise until you can discuss the trouble with your clinician.   If advised by your physician, during your recovery, avoid activity or exercises which involve actions that place your right / left hand or elbow above your head or behind your back or head. These positions stress the tissues which are trying to heal.  STRENGTH - Scapular Depression and Adduction  With good posture, sit on a firm chair. Supported your arms in front of you with pillows, arm rests or a table top. Have your elbows in line with the sides of your body.   Gently draw your shoulder blades down and toward your mid-back spine. Gradually increase the tension without tensing the muscles along the top of your shoulders and the back of your neck.   Hold for __________ seconds.  Slowly release the tension and relax your muscles completely before completing the next repetition.   After you have practiced this exercise, remove the arm support and complete it in standing as well as sitting.  Repeat __________ times. Complete this exercise __________ times per day.  STRENGTH - External Rotators  Secure a rubber exercise band/tubing to a fixed object so that it is at the same height as your right / left elbow when you are standing or sitting on a firm surface.   Stand or sit so that the secured exercise band/tubing is at your side that is not injured.   Bend your elbow 90 degrees. Place a folded towel or small pillow under your right / left arm so that your elbow is a few inches away from your side.   Keeping the tension on the exercise band/tubing, pull it away from your body, as if pivoting on your elbow. Be sure to keep your body steady so that the movement is only coming from your shoulder rotating.   Hold __________ seconds. Release the tension in a controlled manner as  you return to the starting position.  Repeat __________ times. Complete this exercise __________ times per day.  STRENGTH - Supraspinatus  Stand or sit with good posture. Grasp a __________ weight or an exercise band/tubing so that your hand is "thumbs-up," like when you shake hands.   Slowly lift your right / left hand from your thigh into the air, traveling about 30 degrees from straight out at your side. Lift your hand to shoulder height or as far as you can without increasing any shoulder pain. Initially, many people do not lift their hands above shoulder height.   Avoid shrugging your right / left shoulder as your arm rises by keeping your shoulder blade tucked down and toward your mid-back spine.   Hold for __________ seconds. Control the descent of your hand as you slowly return to your starting position.  Repeat __________ times. Complete this exercise __________ times per day.  STRENGTH -  Shoulder Extensors  Secure a rubber exercise band/tubing so that it is at the height of your shoulders when you are either standing or sitting on a firm arm-less chair.   With a thumbs-up grip, grasp an end of the band/tubing in each hand. Straighten your elbows and lift your hands straight in front of you at shoulder height. Step back away from the secured end of band/tubing until it becomes tense.   Squeezing your shoulder blades together, pull your hands down to the sides of your thighs. Do not allow your hands to go behind you.   Hold for __________ seconds. Slowly ease the tension on the band/tubing as you reverse the directions and return to the starting position.  Repeat __________ times. Complete this exercise __________ times per day.  STRENGTH - Scapular Retractors  Secure a rubber exercise band/tubing so that it is at the height of your shoulders when you are either standing or sitting on a firm arm-less chair.   With a palm-down grip, grasp an end of the band/tubing in each hand. Straighten your elbows and lift your hands straight in front of you at shoulder height. Step back away from the secured end of band/tubing until it becomes tense.   Squeezing your shoulder blades together, draw your elbows back as you bend them. Keep your upper arm lifted away from your body throughout the exercise.   Hold __________ seconds. Slowly ease the tension on the band/tubing as you reverse the directions and return to the starting position.  Repeat __________ times. Complete this exercise __________ times per day. STRENGTH - Scapular Depressors  Find a sturdy chair without wheels, such as a from a dining room table.   Keeping your feet on the floor, lift your bottom from the seat and lock your elbows.   Keeping your elbows straight, allow gravity to pull your body weight down. Your shoulders will rise toward your ears.   Raise your body against gravity by drawing your shoulder blades down  your back, shortening the distance between your shoulders and ears. Although your feet should always maintain contact with the floor, your feet should progressively support less body weight as you get stronger.   Hold __________ seconds. In a controlled and slow manner, lower your body weight to begin the next repetition.  Repeat __________ times. Complete this exercise __________ times per day.  Document Released: 01/31/2005 Document Revised: 03/08/2011 Document Reviewed: 07/01/2008 [facility] Patient Information 2012 [place], [place].

## 2011-07-06 NOTE — Telephone Encounter (Signed)
Pt is hurting so bad "she can't see straight"  Wants to know what she can do.

## 2011-07-06 NOTE — ED Notes (Addendum)
Pt reports falling out of tub this am.  Linda Cervantes to PCP, had xrays (unsure of results).  Pt now reports mid back pain radiating to head.  Pt moving without difficulty.  Pt tearful.  Ambulatory.

## 2011-07-07 ENCOUNTER — Encounter (HOSPITAL_COMMUNITY): Payer: Self-pay | Admitting: *Deleted

## 2011-07-07 LAB — POCT I-STAT, CHEM 8
Calcium, Ion: 1.22 mmol/L (ref 1.12–1.32)
Chloride: 106 mEq/L (ref 96–112)
Creatinine, Ser: 0.4 mg/dL — ABNORMAL LOW (ref 0.50–1.10)
Glucose, Bld: 148 mg/dL — ABNORMAL HIGH (ref 70–99)
Potassium: 4.3 mEq/L (ref 3.5–5.1)
Sodium: 139 mEq/L (ref 135–145)
TCO2: 20 mmol/L (ref 0–100)

## 2011-07-07 LAB — DIFFERENTIAL
Eosinophils Absolute: 0 10*3/uL (ref 0.0–0.7)
Eosinophils Relative: 0 % (ref 0–5)
Lymphocytes Relative: 14 % (ref 12–46)
Lymphs Abs: 1.2 10*3/uL (ref 0.7–4.0)
Monocytes Absolute: 0.3 10*3/uL (ref 0.1–1.0)

## 2011-07-07 LAB — CBC
HCT: 39.2 % (ref 36.0–46.0)
MCH: 29.2 pg (ref 26.0–34.0)
MCV: 83.6 fL (ref 78.0–100.0)
Platelets: 220 10*3/uL (ref 150–400)
RBC: 4.69 MIL/uL (ref 3.87–5.11)
WBC: 8.8 10*3/uL (ref 4.0–10.5)

## 2011-07-07 LAB — RAPID URINE DRUG SCREEN, HOSP PERFORMED
Amphetamines: NOT DETECTED
Barbiturates: NOT DETECTED
Tetrahydrocannabinol: POSITIVE — AB

## 2011-07-07 MED ORDER — SODIUM CHLORIDE 0.9 % IV SOLN
INTRAVENOUS | Status: DC
  Administered 2011-07-07: 02:00:00 via INTRAVENOUS

## 2011-07-07 MED ORDER — FENTANYL CITRATE 0.05 MG/ML IJ SOLN
50.0000 ug | Freq: Once | INTRAMUSCULAR | Status: AC
  Administered 2011-07-07: 50 ug via INTRAVENOUS
  Filled 2011-07-07: qty 2

## 2011-07-07 MED ORDER — ONDANSETRON HCL 4 MG/2ML IJ SOLN
4.0000 mg | Freq: Once | INTRAMUSCULAR | Status: AC
  Administered 2011-07-07: 4 mg via INTRAVENOUS
  Filled 2011-07-07: qty 2

## 2011-07-07 NOTE — ED Notes (Signed)
Patient very angry and upset about not getting any more meds.  Stated her back is feeling better but her neck and head are hurting "real bad".  Patient tearful at this time

## 2011-07-07 NOTE — ED Notes (Signed)
Patient presents with c/o mid back pain.  This AM she fell getting out of the bathtub.  Followed up with her PCP and was sent here for a left shoulder xray.  Flexeril, Mobic and Vicodin given and has been taking them as directed.  Tonight she started with mid back pain which she is unable to control with the meds.  States this back pain feels  Like it did when she had viral menigitis.

## 2011-07-07 NOTE — Discharge Instructions (Signed)
Pain of Unknown Etiology (Pain Without a Known Cause) You have come to your caregiver because of pain. Pain can occur in any part of the body. Often there is not a definite cause. If your laboratory (blood or urine) work was normal and x-rays or other studies were normal, your caregiver may treat you without knowing the cause of the pain. An example of this is the headache. Most headaches are diagnosed by taking a history. This means your caregiver asks you questions about your headaches. Your caregiver determines a treatment based on your answers. Usually testing done for headaches is normal. Often testing is not done unless there is no response to medications. Regardless of where your pain is located today, you can be given medications to make you comfortable. If no physical cause of pain can be found, most cases of pain will gradually leave as suddenly as they came.  If you have a painful condition and no reason can be found for the pain, It is importantthat you follow up with your caregiver. If the pain becomes worse or does not go away, it may be necessary to repeat tests and look further for a possible cause.  Only take over-the-counter or prescription medicines for pain, discomfort, or fever as directed by your caregiver.   For the protection of your privacy, test results can not be given over the phone. Make sure you receive the results of your test. Ask as to how these results are to be obtained if you have not been informed. It is your responsibility to obtain your test results.   You may continue all activities unless the activities cause more pain. When the pain lessens, it is important to gradually resume normal activities. Resume activities by beginning slowly and gradually increasing the intensity and duration of the activities or exercise. During periods of severe pain, bed-rest may be helpful. Lay or sit in any position that is comfortable.   Ice used for acute (sudden) conditions may be  effective. Use a large plastic bag filled with ice and wrapped in a towel. This may provide pain relief.   See your caregiver for continued problems. They can help or refer you for exercises or physical therapy if necessary.  If you were given medications for your condition, do not drive, operate machinery or power tools, or sign legal documents for 24 hours. Do not drink alcohol, take sleeping pills, or take other medications that may interfere with treatment. See your caregiver immediately if you have pain that is becoming worse and not relieved by medications. Document Released: 12/12/2000 Document Revised: 03/08/2011 Document Reviewed: 03/19/2005 [employer] Patient Information 2012 [employer]. 

## 2011-07-07 NOTE — ED Provider Notes (Signed)
History     CSN: [id number]  Arrival date & time 07/06/11  2235   First MD Initiated Contact with Patient 07/07/11 0147      Chief Complaint  Patient presents with  . Pain in spine     (Consider location/radiation/quality/duration/timing/severity/associated sxs/prior treatment) HPI This is a 46 year old white female who was getting out of her bathtub yesterday morning and fell. She fell onto her left outstretched hand. This caused pain in the left shoulder. She was seen by her primary care physician yesterday. X-rays of the left shoulder were obtained which were negative for fracture or dislocation. Yesterday evening she developed the gradual onset of pain in her mid back which she states "radiates up into my brain". She states the pain is severe and feels like pain she had with viral meningitis. Her neck is not stiff. The pain is worse with movement. She denies fever. She states she is nauseated but has not vomited. She was given hydrocodone, Flexeril and Mobic yesterday and these have not relieved her pain.  Past Medical History  Diagnosis Date  . Anxiety   . Depression   . Substance abuse Clean as of 2009    Crack cocaine  . GERD (gastroesophageal reflux disease)   . Heart murmur   . Back pain     Past Surgical History  Procedure Date  . Cesarean section   . Tubal ligation 2000    Family History  Problem Relation Age of Onset  . Hypertension Father   . Diabetes Maternal Grandmother     History  Substance Use Topics  . Smoking status: Current Everyday Smoker -- 1.0 packs/day    Types: Cigarettes  . Smokeless tobacco: Never Used  . Alcohol Use: No    OB History    Grav Para Term Preterm Abortions TAB SAB Ect Mult Living   3 3 3       3       Review of Systems  All other systems reviewed and are negative.    Allergies  Review of patient's allergies indicates no known allergies.  Home Medications   Current Outpatient Rx  Name Route Sig Dispense Refill    . ALBUTEROL SULFATE HFA 108 (90 BASE) MCG/ACT IN AERS Inhalation Inhale 2 puffs into the lungs every 6 (six) hours as needed. For shortness of breath     . CYCLOBENZAPRINE HCL 10 MG PO TABS Oral Take 10 mg by mouth 3 (three) times daily as needed. For muscle spasms    . ESOMEPRAZOLE MAGNESIUM 20 MG PO CPDR Oral Take 1 capsule (20 mg total) by mouth daily. 30 capsule 2  . HYDROCODONE-ACETAMINOPHEN 5-500 MG PO TABS Oral Take 1 tablet by mouth every 8 (eight) hours as needed. For pain    . MELOXICAM 7.5 MG PO TABS Oral Take 7.5 mg by mouth daily.    . OXYBUTYNIN CHLORIDE 5 MG PO TABS Oral Take 5 mg by mouth 3 (three) times daily.    . QUETIAPINE FUMARATE 100 MG PO TABS Oral Take 1 tablet (100 mg total) by mouth at bedtime. 30 tablet 0    Per Dr. Kathrynn Running in [facility].  Marland Kitchen SERTRALINE HCL 100 MG PO TABS Oral Take 100 mg by mouth daily.    Marland Kitchen ZOLPIDEM TARTRATE 10 MG PO TABS Oral Take 10 mg by mouth at bedtime as needed. For insomnia      BP 123/74  Pulse 90  Temp(Src) 98.2 F (36.8 C) (Oral)  Resp 18  SpO2 99%  LMP 06/05/2011  Physical Exam General: Well-developed, well-nourished female in no acute distress; appearance consistent with age of record HENT: normocephalic, atraumatic Eyes: pupils equal round and reactive to light; extraocular muscles intact Neck: supple; full range of motion; no soft tissue tenderness Heart: regular rate and rhythm Lungs: clear to auscultation bilaterally Abdomen: soft; nondistended; nontender Back: Mild tenderness of entire thoracic spine and paraspinal soft tissue Extremities: No deformity; full range of motion; pulses normal; pain on movement of left shoulder Neurologic: Awake, alert; motor function intact in all extremities and symmetric; no facial droop Skin: Warm and dry Psychiatric: Tearful; agitated    ED Course  Procedures (including critical care time)   MDM   Nursing notes and vitals signs, including pulse oximetry, reviewed.  Summary of this  visit's results, reviewed by myself:  Labs:  Results for orders placed during the hospital encounter of 07/06/11  CBC      Component Value Range   WBC 8.8  4.0 - 10.5 (K/uL)   RBC 4.69  3.87 - 5.11 (MIL/uL)   Hemoglobin 13.7  12.0 - 15.0 (g/dL)   HCT 74.2  59.5 - 63.8 (%)   MCV 83.6  78.0 - 100.0 (fL)   MCH 29.2  26.0 - 34.0 (pg)   MCHC 34.9  30.0 - 36.0 (g/dL)   RDW 75.6  43.3 - 29.5 (%)   Platelets 220  150 - 400 (K/uL)  DIFFERENTIAL      Component Value Range   Neutrophils Relative 83 (*) 43 - 77 (%)   Neutro Abs 7.3  1.7 - 7.7 (K/uL)   Lymphocytes Relative 14  12 - 46 (%)   Lymphs Abs 1.2  0.7 - 4.0 (K/uL)   Monocytes Relative 4  3 - 12 (%)   Monocytes Absolute 0.3  0.1 - 1.0 (K/uL)   Eosinophils Relative 0  0 - 5 (%)   Eosinophils Absolute 0.0  0.0 - 0.7 (K/uL)   Basophils Relative 0  0 - 1 (%)   Basophils Absolute 0.0  0.0 - 0.1 (K/uL)  URINE RAPID DRUG SCREEN (HOSP PERFORMED)      Component Value Range   Opiates POSITIVE (*) NONE DETECTED    Cocaine NONE DETECTED  NONE DETECTED    Benzodiazepines NONE DETECTED  NONE DETECTED    Amphetamines NONE DETECTED  NONE DETECTED    Tetrahydrocannabinol PENDING  NONE DETECTED    Barbiturates NONE DETECTED  NONE DETECTED   POCT I-STAT, CHEM 8      Component Value Range   Sodium 139  135 - 145 (mEq/L)   Potassium 4.3  3.5 - 5.1 (mEq/L)   Chloride 106  96 - 112 (mEq/L)   BUN <3 (*) 6 - 23 (mg/dL)   Creatinine, Ser 1.88 (*) 0.50 - 1.10 (mg/dL)   Glucose, Bld 416 (*) 70 - 99 (mg/dL)   Calcium, Ion 6.06  3.01 - 1.32 (mmol/L)   TCO2 20  0 - 100 (mmol/L)   Hemoglobin 14.3  12.0 - 15.0 (g/dL)   HCT 60.1  09.3 - 23.5 (%)    Imaging Studies: Dg Shoulder Left  07/06/2011  *RADIOLOGY REPORT*  Clinical Data: Fell, pain  LEFT SHOULDER - 2+ VIEW  Comparison:  None.  Findings:  There is no evidence of fracture or dislocation.  There is no evidence of arthropathy or other focal bone abnormality. Soft tissues are unremarkable. There is  evidence for healing or healed left seventh rib fracture.  IMPRESSION: Negative for shoulder injury.  Original Report Authenticated By: Elsie Stain, M.D.   5:19 AM Patient states she needs to go home at this time. She has had improvement with IV fentanyl. Her neck is still fully flexible. She has no fever or leukocytosis.         Hanley Seamen, MD 07/07/11 [phone]

## 2011-07-09 NOTE — Telephone Encounter (Signed)
Patient came in for office visit 07/06/2011

## 2011-07-11 NOTE — Progress Notes (Signed)
Subjective:     Patient ID: Linda Cervantes, female   DOB: 1965/09/04, 46 y.o.   MRN: [id number]  HPI 46 yo F presents following fall from tub onto outstretched L hand. She reports slipping. She reports pain 10/10 in Her L shoulder anterior ly and L lateral neck. She denies head trauma or loss of consciousness. She reports feeling popping in her L shoulder and sensation of her shoulder is out of the socket. She denies tingling or numbness down her arm or in her hand. Pain is relieved by nothing and exacerbated by movement and palpation.   Review of Systems As per HPI    Objective:   Physical Exam BP 117/77  Pulse 74  Temp(Src) 98.4 F (36.9 C) (Oral)  Wt 135 lb 9.6 oz (61.508 kg)  LMP 06/05/2011 General appearance: alert, cooperative and moderately obese Head: Normocephalic, without obvious abnormality, atraumatic Neck: supple, symmetrical. Full range of motion. Mild TTP on L lateral neck.  L shoulder: symmetrical to R, no swelling, no bruising, full ROM. Full strength in all planes or motion. Pain with elevation. No drop with active adduction. Pain elicited by all maneuvers including Jobes test and Hawkins test.   2 View x-ray of L shoulder: Findings: There is no evidence of fracture or dislocation. There is no evidence of arthropathy or other focal bone abnormality. Soft tissues are unremarkable. There is evidence for healing or  healed left seventh rib fracture.   IMPRESSION:  Negative for shoulder injury.   Assessment and Plan:     Acute injury to L shoulder following fall from shower. Exam concering for a rotator cuff tear. No fracture or dislocation.  Plan: In office injection Toradol 60 IM and Solumedrol 125 mg IM.  Pain control with Vicodin and flexeril at home.  Reviewed and provided exercises to maintain range of motion.  Did not give sling and discussed with patient that sling may result in frozen shoulder.  Planned for f/u in 1 week at [facility] for possible ultrasound  evaluation.

## 2011-07-12 ENCOUNTER — Ambulatory Visit (INDEPENDENT_AMBULATORY_CARE_PROVIDER_SITE_OTHER): Payer: Self-pay | Admitting: Family Medicine

## 2011-07-12 ENCOUNTER — Encounter: Payer: Self-pay | Admitting: Family Medicine

## 2011-07-12 VITALS — BP 139/89 | HR 93 | Temp 97.7°F | Ht 63.0 in | Wt 133.4 lb

## 2011-07-12 DIAGNOSIS — R3129 Other microscopic hematuria: Secondary | ICD-10-CM

## 2011-07-12 DIAGNOSIS — S46002A Unspecified injury of muscle(s) and tendon(s) of the rotator cuff of left shoulder, initial encounter: Secondary | ICD-10-CM

## 2011-07-12 DIAGNOSIS — S4980XA Other specified injuries of shoulder and upper arm, unspecified arm, initial encounter: Secondary | ICD-10-CM

## 2011-07-12 DIAGNOSIS — R102 Pelvic and perineal pain: Secondary | ICD-10-CM

## 2011-07-12 DIAGNOSIS — N949 Unspecified condition associated with female genital organs and menstrual cycle: Secondary | ICD-10-CM

## 2011-07-12 MED ORDER — MELOXICAM 15 MG PO TABS: 15.0000 mg | ORAL_TABLET | Freq: Every day | ORAL | Status: DC

## 2011-07-12 MED ORDER — HYDROCODONE-ACETAMINOPHEN 5-325 MG PO TABS: 1.0000 | ORAL_TABLET | Freq: Four times a day (QID) | ORAL | Status: AC | PRN

## 2011-07-12 MED ORDER — HYDROCODONE-ACETAMINOPHEN 5-500 MG PO TABS: 1.0000 | ORAL_TABLET | Freq: Three times a day (TID) | ORAL | Status: DC | PRN

## 2011-07-12 NOTE — Assessment & Plan Note (Addendum)
Acute injury to L shoulder following fall from shower. Exam concering for a rotator cuff tear. No fracture or dislocation.  Plan: In office injection Toradol 60 IM and Solumedrol 125 mg IM.  Pain control with Vicodin and flexeril at home.  Reviewed and provided exercises to maintain range of motion.  Did not give sling and discussed with patient that sling may result in frozen shoulder.  Planned for f/u in 1 week at [facility] for possible ultrasound evaluation.

## 2011-07-12 NOTE — Progress Notes (Signed)
  Subjective:    Patient ID: Linda Cervantes, female    DOB: 1966-01-21, 46 y.o.   MRN: [id number]  HPI 1.  Shoulder pain:  Patient fell from bathtub about 2 weeks ago.  Landed on  Back of arm.  Has had shoulder pain since that point.  Difficulty raising arm above head, difficulty sleeping on that side due to pain.  No numbness or paresthesias in arm.  No fevers or chills.    2.  Pelvic pain:  Persists.  Menstrual period started 3 days ago, acutely worsened with menstrual period.  Describes as 8/10 pain currently, 3-4/10 pain other times.  Pain relieved by Hydrocodone use.  No dysuria.    She is moving to [facility] and therefore will be seen at [facility] rather than [facility].  Appointment is evidently already set up for 4/16.      Review of Systems See HPI above for review of systems.       Objective:   Physical Exam Gen:  Alert, cooperative patient who appears stated age in no acute distress.  Vital signs reviewed. Cardiac:  Regular rate and rhythm without murmur auscultated.  Good S1/S2. Pulm:  Clear to auscultation bilaterally with good air movement.  No wheezes or rales noted.   Abdomen:  Soft and nondistended. Tender bilaterally lower quadrants. No guarding or rebound. Good bowel sounds throughout. Ext:  No edema. MSK:  Shoulder Exam: Appearance: Normal Pain on palpation: along acromion and infraspinatus, mild in nature ROM: Abduction full     Int some tenderness  Ext no   Empty Can: yes  Hawkin's: no  Neers: yes  Speeds:no  Yergason's test: no  O'brien test:  no  Compression:  no         Assessment & Plan:

## 2011-07-17 NOTE — Assessment & Plan Note (Addendum)
Most likely etiology of shoulder pain secondary to her fall. Empty can's and Neers positive. Recommended increasing dose of Meloxicam to 15 mg to help with inflammation and pain relief as she has not experienced this with 7.5 mg.

## 2011-07-17 NOTE — Assessment & Plan Note (Signed)
Persists. Provided refill for hydrocodone today. She is moving to [place] and will be establishing with physician there.   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

## 2011-07-17 NOTE — Assessment & Plan Note (Signed)
She will be seen at [facility] rather than [facility].   Evidently appt already set up?  She states date is 4/16.

## 2011-07-18 ENCOUNTER — Ambulatory Visit: Payer: No Typology Code available for payment source | Admitting: Psychology

## 2011-08-13 ENCOUNTER — Other Ambulatory Visit: Payer: Self-pay | Admitting: Family Medicine

## 2011-08-14 ENCOUNTER — Other Ambulatory Visit: Payer: Self-pay | Admitting: Family Medicine

## 2011-08-14 ENCOUNTER — Telehealth: Payer: Self-pay | Admitting: Family Medicine

## 2011-08-14 NOTE — Telephone Encounter (Signed)
It looks like she has had 120 tablets since 07/12/11.  We cannot refill again at this time.

## 2011-08-14 NOTE — Telephone Encounter (Signed)
Pt has started her period and is in a lot of pain.  Wants to know if she can have Norco called in. [facility]- [place], [place]

## 2011-08-14 NOTE — Telephone Encounter (Signed)
Spoke with patient and gave message from Dr. [place].  Appointment scheduled with Dr. Gwendolyn Grant for 09/04/2011. Patient asks what is she suppose to do in the meantime.  Explained how the medication had been prescribed and if she is having that much discomfort she should go to an Urgent Care to be evaluated.

## 2011-08-14 NOTE — Telephone Encounter (Signed)
Patient states she was given refill on Norco at last office visit and has also used refill. States she takes 1.5 tabs every 4-5 hours. She  Has moved to [place]. Has not established with a doctor there yet. Having problem with financial assistance like she had through [facility] .   Advised will ask MD and will call her back.    States she has constant pelvic pain and when she has  period the pain is worse. Has bled now for 8 days. Bleeding was heavy first few days now decreasing.

## 2011-09-04 ENCOUNTER — Ambulatory Visit: Payer: Self-pay | Admitting: Family Medicine

## 2011-09-06 ENCOUNTER — Ambulatory Visit (INDEPENDENT_AMBULATORY_CARE_PROVIDER_SITE_OTHER): Payer: Self-pay | Admitting: Family Medicine

## 2011-09-06 ENCOUNTER — Encounter: Payer: Self-pay | Admitting: Family Medicine

## 2011-09-06 VITALS — BP 135/81 | HR 79 | Temp 98.0°F | Ht 63.0 in | Wt 133.7 lb

## 2011-09-06 DIAGNOSIS — R102 Pelvic and perineal pain unspecified side: Secondary | ICD-10-CM

## 2011-09-06 DIAGNOSIS — R3129 Other microscopic hematuria: Secondary | ICD-10-CM

## 2011-09-06 DIAGNOSIS — N949 Unspecified condition associated with female genital organs and menstrual cycle: Secondary | ICD-10-CM

## 2011-09-06 DIAGNOSIS — G4700 Insomnia, unspecified: Secondary | ICD-10-CM

## 2011-09-06 MED ORDER — OXYCODONE-ACETAMINOPHEN 7.5-500 MG PO TABS: 1.0000 | ORAL_TABLET | Freq: Three times a day (TID) | ORAL | Status: AC | PRN

## 2011-09-06 MED ORDER — ZOLPIDEM TARTRATE 10 MG PO TABS: 10.0000 mg | ORAL_TABLET | Freq: Every evening | ORAL | Status: DC | PRN

## 2011-09-06 MED ORDER — ALBUTEROL SULFATE HFA 108 (90 BASE) MCG/ACT IN AERS: 2.0000 | INHALATION_SPRAY | Freq: Four times a day (QID) | RESPIRATORY_TRACT | Status: DC | PRN

## 2011-09-06 NOTE — Progress Notes (Signed)
  Subjective:    Patient ID: Linda Cervantes, female    DOB: June 16, 1965, 46 y.o.   MRN: [id number]  HPI 1.  Abdominal Pain:  Linda Cervantes has moved to [place].  She has been seen in the ED 3 times for her chronic pelvic pain there and has been prescribed Percocet for her pain.  She states this works much better for her than the Hydrocodone.  She has been diagnosed by OB/Gyn there with endometriosis and told she needed surgery; however they do not have similar payment plan to [facility] Card here and she would like to see gynecologist here due to [employer].    Describes continued aching in pelvic that becomes sharp, stabbing pain 2-3 days prior and after her menses.  Pain much more severe during sexual intercourse.  No vaginal discharge.  No vaginal bleedding.  No nausea or vomiting, no diarrhea or changes in bowel movements.     Review of Systems See HPI above for review of systems.      Objective:   Physical Exam  Gen:  Alert, cooperative patient who appears stated age in no acute distress.  Vital signs reviewed. Abdomen:  No tenderness upper quadrants.  BL Lower quadrant tenderness, mild to moderate in nature.  Soft/nondistended, good bowel sounds, no masses.       Assessment & Plan:

## 2011-09-06 NOTE — Patient Instructions (Signed)
We will get you set up with the OB-GYN here at [facility]. They are very good. It was very good to see you again today!  I am becoming a Faculty member in July -- I will keep some of my patients but will lose others.  I'm not sure who I will be keeping yet.  I've enjoyed taking care of you!

## 2011-09-07 ENCOUNTER — Telehealth: Payer: Self-pay | Admitting: Family Medicine

## 2011-09-07 NOTE — Telephone Encounter (Signed)
Patient is aware that this referral goes through women's clinic work queue and they will contact her

## 2011-09-07 NOTE — Telephone Encounter (Signed)
Patient is calling because she was in yesterday and was told that an appt would be made for her to go to the [facility].  She would prefer the appt be on 6/24 in the afternoon if at all possible.

## 2011-09-07 NOTE — Assessment & Plan Note (Signed)
States that she has appt with Urology in [facility] in 1-2 months, unclear exactly when.

## 2011-09-07 NOTE — Assessment & Plan Note (Signed)
Evidently diagnosed with endometriosis, she states based upon scarring visualized on ultrasound.  I do not have these records, only the discharge paperwork she brought from the hospitals. She is having increasing opiate requirement -- I discussed that I am not comfortable providing narcotic pain relievers for pain of over 6 months duration nor for someone requiring increasingly higher narcotic dosages.  Recommended she be referred to pain management, either heer or in [place].     In meantime, will refer her to Ob-Gyn here for further evaluation for her presumed enometriosis.

## 2011-09-07 NOTE — Assessment & Plan Note (Signed)
Persists.   Treated with Ambien.

## 2011-09-24 ENCOUNTER — Telehealth: Payer: Self-pay | Admitting: Family Medicine

## 2011-09-24 NOTE — Telephone Encounter (Signed)
Patient is calling because she is in severe pain and would like a prescription for something that the [facility] Card will pay for.  Also, she has not heard from [facility] about the appt that she was referred for.  She would like to speak to Dr. Gwendolyn Grant.

## 2011-09-25 MED ORDER — OXYCODONE-ACETAMINOPHEN 5-325 MG PO TABS: 1.0000 | ORAL_TABLET | Freq: Two times a day (BID) | ORAL | Status: DC

## 2011-09-25 NOTE — Telephone Encounter (Signed)
I will begin opiate taper to prevent sudden cessation and withdrawal.  Referral to pain clinic today.

## 2011-09-25 NOTE — Telephone Encounter (Signed)
There are several issues here:  Linda Cervantes has required an increasing dosage and amount of pain medications and we have discussed that this is a chronic pain issue.  I am uncomfortable providing her any further medications at this rate.  I can put in a referral to a pain center today.   Furthermore, she is now living in [place], [place].  It is impractical for her to drive 4 hours one way to see me.  She should find a physician there in [facility] who can see her more regularly.  Unsure about when the Gyn clinic can see her.

## 2011-09-25 NOTE — Telephone Encounter (Signed)
Pt calling again to see what Dr Gwendolyn Grant wants her to do.

## 2011-09-25 NOTE — Telephone Encounter (Signed)
Called and someone picked up the phone and hung up.Linda Cervantes

## 2011-09-25 NOTE — Telephone Encounter (Signed)
Called pt and told her that Dr. Gwendolyn Grant is not comfortable with providing her with these meds and that it would be in her best interest to find a pcp in her area that will be able to accommodate her pain management needs and will place a referral for her to be seen at a pain clinic. Pt then told me that she no longer lives in [place] and that she has since moved back to [place] this past weekend with one of her friends [address] in [place]. She wanted me to tell Dr. Gwendolyn Grant that she is in a lot of pain and feels as if she is going through withdrawals. She wanted to know if Dr. Gwendolyn Grant can see her today? i told her that I would have to forward this information to him and await to hear back from him about this. Pt voiced understanding and agreed.Loralee Pacas [place]

## 2011-09-27 ENCOUNTER — Ambulatory Visit (INDEPENDENT_AMBULATORY_CARE_PROVIDER_SITE_OTHER): Payer: Self-pay | Admitting: Family Medicine

## 2011-09-27 ENCOUNTER — Encounter: Payer: Self-pay | Admitting: Family Medicine

## 2011-09-27 VITALS — BP 121/81 | HR 94 | Temp 98.5°F | Ht 63.0 in | Wt 132.0 lb

## 2011-09-27 DIAGNOSIS — F329 Major depressive disorder, single episode, unspecified: Secondary | ICD-10-CM

## 2011-09-27 DIAGNOSIS — R102 Pelvic and perineal pain: Secondary | ICD-10-CM

## 2011-09-27 DIAGNOSIS — F1120 Opioid dependence, uncomplicated: Secondary | ICD-10-CM

## 2011-09-27 DIAGNOSIS — N949 Unspecified condition associated with female genital organs and menstrual cycle: Secondary | ICD-10-CM

## 2011-09-27 DIAGNOSIS — F1123 Opioid dependence with withdrawal: Secondary | ICD-10-CM

## 2011-09-27 MED ORDER — OXYCODONE-ACETAMINOPHEN 5-325 MG PO TABS: 1.0000 | ORAL_TABLET | Freq: Two times a day (BID) | ORAL | Status: AC

## 2011-09-27 NOTE — Patient Instructions (Addendum)
Your appt with the gynecologist is in July.  It's written on the blue card. Make an appt to see Dr. Kathrynn Running and Dr. Pascal Lux again. We will call you with the pain management referral as well.   It was good to see you

## 2011-09-28 DIAGNOSIS — F1123 Opioid dependence with withdrawal: Secondary | ICD-10-CM

## 2011-09-28 DIAGNOSIS — F1193 Opioid use, unspecified with withdrawal: Secondary | ICD-10-CM

## 2011-09-28 HISTORY — DX: Opioid use, unspecified with withdrawal: F11.93

## 2011-09-28 HISTORY — DX: Opioid dependence with withdrawal: F11.23

## 2011-09-28 NOTE — Assessment & Plan Note (Addendum)
Discussed this is not life-threatening issue. Provided patient with pain medications for relief as well as resolution of symptoms. Also discussed other ways to prevent narcotic withdrawal and treatment of symptoms besides pain medications.    ** Note:  Although oxycodone prescription was printed from previous phone note, this prescription was shredded and NOT given to patient as I saw she had an appt with me.

## 2011-09-28 NOTE — Progress Notes (Signed)
  Subjective:    Patient ID: Linda Cervantes, female    DOB: 04-Jan-1966, 46 y.o.   MRN: [id number]  HPI  1.  Chronic pelvic pain:  Persists.  Resolved with Oxycodone.  Again had conversation regarding opiate use and it's eventual dependence as well as lack of effectiveness in treating pelvic pain.  Pain described as aching in pelvic region bilaterally, worse with sex and menses but present at all times.  2.  Withdrawal symptoms:  Has been 2 days without medications.  Describes watery diarrhea, difficulty sleeping, "jitteriness."  No palpitations.  Started yesterday.  3.  Anxiety:  Persists.  No longer taking Seroquel, stopped on her on.  Continues to take Zoloft. She is not return to see mood disorder clinic since she moved [place]. Denies any depression or suicidal ideation. No hallucinations  Patient has been packed [place]. She states she eventually wants to back to [facility] after having surgery for her endometriosis. She does desire surgery as definitive treatment.  Review of Systems See HPI above for review of systems.       Objective:   Physical Exam  BP 121/81  Pulse 94  Temp 98.5 F (36.9 C) (Oral)  Ht 5\' 3"  (1.6 m)  Wt 132 lb (59.875 kg)  BMI 23.38 kg/m2 Gen: Well NAD HEENT: EOMI,  MMM Lungs: CTABL Nl WOB Heart: RRR no MRG Abd: soft/nondistended/tender BL lower quadrants.  Good bowel sounds. Exts: Non edematous BL  LE, warm and well perfused.  Psych:  Somewhat anxious appearing.         Assessment & Plan:

## 2011-09-28 NOTE — Assessment & Plan Note (Signed)
Discussed hazards of stopping medications suddenly. Recommended early FU with [facility]. Also discussed restarting Seroquel and she states she would rather wait to discuss this with the physicians of mood disorder clinic.

## 2011-09-28 NOTE — Assessment & Plan Note (Signed)
Chronic issue at this point. Referring to pain management for further recommendations. She has appointment with OB/GYN in July, encouraged to keep that appt.

## 2011-10-08 ENCOUNTER — Telehealth: Payer: Self-pay | Admitting: *Deleted

## 2011-10-08 NOTE — Telephone Encounter (Signed)
Spoke with patient and she is informed of dental appointment 11/05/2011 @ 9:45 am. [facility]

## 2011-10-10 ENCOUNTER — Inpatient Hospital Stay (HOSPITAL_COMMUNITY)
Admission: AD | Admit: 2011-10-10 | Discharge: 2011-10-10 | Disposition: A | Discharge status: Home / Self Care | Payer: Self-pay | Source: Ambulatory Visit | Attending: Family Medicine | Admitting: Family Medicine

## 2011-10-10 ENCOUNTER — Encounter (HOSPITAL_COMMUNITY): Payer: Self-pay | Admitting: *Deleted

## 2011-10-10 DIAGNOSIS — N949 Unspecified condition associated with female genital organs and menstrual cycle: Secondary | ICD-10-CM | POA: Insufficient documentation

## 2011-10-10 DIAGNOSIS — F1123 Opioid dependence with withdrawal: Secondary | ICD-10-CM

## 2011-10-10 DIAGNOSIS — R102 Pelvic and perineal pain: Secondary | ICD-10-CM

## 2011-10-10 DIAGNOSIS — R1032 Left lower quadrant pain: Secondary | ICD-10-CM | POA: Insufficient documentation

## 2011-10-10 HISTORY — DX: Endometriosis, unspecified: N80.9

## 2011-10-10 LAB — URINE MICROSCOPIC-ADD ON

## 2011-10-10 LAB — URINALYSIS, ROUTINE W REFLEX MICROSCOPIC
Glucose, UA: NEGATIVE mg/dL
Ketones, ur: NEGATIVE mg/dL
Protein, ur: NEGATIVE mg/dL

## 2011-10-10 MED ORDER — OXYCODONE-ACETAMINOPHEN 5-325 MG PO TABS
2.0000 | ORAL_TABLET | ORAL | Status: AC
  Administered 2011-10-10: 2 via ORAL
  Filled 2011-10-10: qty 1
  Filled 2011-10-10: qty 2

## 2011-10-10 MED ORDER — OXYCODONE-ACETAMINOPHEN 5-325 MG PO TABS: 2.0000 | ORAL_TABLET | ORAL | Status: DC

## 2011-10-10 MED ORDER — OXYCODONE-ACETAMINOPHEN 5-325 MG PO TABS: 1.0000 | ORAL_TABLET | Freq: Four times a day (QID) | ORAL | Status: AC | PRN

## 2011-10-10 NOTE — MAU Provider Note (Signed)
Linda Wickizer Pegram45 [id number] Chief Complaint  Patient presents with  . Abdominal Pain     First Provider Initiated Contact with Patient 10/10/11 1500      SUBJECTIVE  HPI: Pt presents to MAU with chronic pelvic pain, which she reports has worsened today and she is out of her prescribed pain medication.  She reports constant severe abdominal pain in suprapubic and R and LLQ.  This pain started around 9 months ago and she has had several ultrasounds and a CT scan for this.  Patient's last menstrual period was 09/15/2011.  She reports normal menstrual history with regular cycles and light to moderate bleeding prior to 9 months ago when heavier, irregular bleeding started. She denies vaginal bleeding, vaginal itching/burning, urinary symptoms, h/a, dizziness, n/v, or fever/chills.  Past Medical History  Diagnosis Date  . Anxiety   . Depression   . Substance abuse Clean as of 2009    Crack cocaine  . GERD (gastroesophageal reflux disease)   . Heart murmur   . Back pain   . Endometriosis    Past Surgical History  Procedure Date  . Cesarean section   . Tubal ligation 2000  . Foot surgery    History   Social History  . Marital Status: Married    Spouse Name: [name]    Number of Children: [name]  . Years of Education: [name]   Occupational History  . Not on file.   Social History Main Topics  . Smoking status: Current Everyday Smoker -- 1.0 packs/day    Types: Cigarettes  . Smokeless tobacco: Never Used  . Alcohol Use: No  . Drug Use: Yes    Special: Marijuana, Cocaine     PAST USE  . Sexually Active: Yes    Birth Control/ Protection: Surgical     TUBAL LIGATION   Other Topics Concern  . Not on file   Social History Narrative  . No narrative on file   No current facility-administered medications on file prior to encounter.   Current Outpatient Prescriptions on File Prior to Encounter  Medication Sig Dispense Refill  . albuterol (PROVENTIL HFA;VENTOLIN HFA) 108 (90 BASE)  MCG/ACT inhaler Inhale 2 puffs into the lungs every 6 (six) hours as needed. For shortness of breath  8.5 g  1  . meloxicam (MOBIC) 15 MG tablet Take 1 tablet (15 mg total) by mouth daily.  30 tablet  1  . oxybutynin (DITROPAN) 5 MG tablet Take 5 mg by mouth 3 (three) times daily.      . sertraline (ZOLOFT) 100 MG tablet Take 100 mg by mouth daily.      Marland Kitchen zolpidem (AMBIEN) 10 MG tablet Take 1 tablet (10 mg total) by mouth at bedtime as needed. For insomnia  30 tablet  0   No Known Allergies  ROS: Pertinent items in HPI  OBJECTIVE Blood pressure 133/65, pulse 78, temperature 98.4 F (36.9 C), temperature source Oral, resp. rate 20, weight 60.601 kg (133 lb 9.6 oz), last menstrual period 09/15/2011, SpO2 98.00%.  GENERAL: Well-developed, well-nourished female in no acute distress.  HEENT: Normocephalic, good dentition HEART: normal rate RESP: normal effort ABDOMEN: Soft, nontender EXTREMITIES: Nontender, no edema NEURO: Alert and oriented SPECULUM EXAM: Deferred   LAB RESULTS  Results for orders placed during the hospital encounter of 10/10/11 (from the past 24 hour(s))  URINALYSIS, ROUTINE W REFLEX MICROSCOPIC     Status: Abnormal   Collection Time   10/10/11  2:00 PM  Component Value Range   Color, Urine YELLOW  YELLOW   APPearance CLEAR  CLEAR   Specific Gravity, Urine <1.005 (*) 1.005 - 1.030   pH 5.5  5.0 - 8.0   Glucose, UA NEGATIVE  NEGATIVE mg/dL   Hgb urine dipstick TRACE (*) NEGATIVE   Bilirubin Urine NEGATIVE  NEGATIVE   Ketones, ur NEGATIVE  NEGATIVE mg/dL   Protein, ur NEGATIVE  NEGATIVE mg/dL   Urobilinogen, UA 0.2  0.0 - 1.0 mg/dL   Nitrite NEGATIVE  NEGATIVE   Leukocytes, UA NEGATIVE  NEGATIVE  URINE MICROSCOPIC-ADD ON     Status: Abnormal   Collection Time   10/10/11  2:00 PM      Component Value Range   Squamous Epithelial / LPF FEW (*) RARE   Bacteria, UA RARE  RARE  POCT PREGNANCY, URINE     Status: Normal   Collection Time   10/10/11  2:22 PM       Component Value Range   Preg Test, Ur NEGATIVE  NEGATIVE     ASSESSMENT Chronic pelvic pain   PLAN Pt has appointment at Gyn clinic on 10/22/11 Discussed assessment and findings with Dr Shawnie Pons D/C home Percocet 5/325 Q6 hours PRN until appointment Return to MAU as needed  Medication List  As of 10/10/2011  3:23 PM   ASK your doctor about these medications         albuterol 108 (90 BASE) MCG/ACT inhaler   Commonly known as: PROVENTIL HFA;VENTOLIN HFA   Inhale 2 puffs into the lungs every 6 (six) hours as needed. For shortness of breath      ibuprofen 200 MG tablet   Commonly known as: ADVIL,MOTRIN   Take 800 mg by mouth every 6 (six) hours as needed. Takes for pain      meloxicam 15 MG tablet   Commonly known as: MOBIC   Take 1 tablet (15 mg total) by mouth daily.      oxybutynin 5 MG tablet   Commonly known as: DITROPAN   Take 5 mg by mouth 3 (three) times daily.      ROXICET PO   Take 1 tablet by mouth every 6 (six) hours as needed. Takes for pain      sertraline 100 MG tablet   Commonly known as: ZOLOFT   Take 100 mg by mouth daily.      zolpidem 10 MG tablet   Commonly known as: AMBIEN   Take 1 tablet (10 mg total) by mouth at bedtime as needed. For insomnia             Linda Cervantes 10/10/2011 3:23 PM

## 2011-10-10 NOTE — MAU Note (Signed)
Patient states she has a history of endometriosis. States she has pain all the time but worse today because period is ready to start. No bleeding.

## 2011-10-11 NOTE — MAU Provider Note (Signed)
Chart reviewed and agree with management and plan.  

## 2011-10-22 ENCOUNTER — Encounter: Payer: Self-pay | Admitting: Obstetrics & Gynecology

## 2011-10-22 ENCOUNTER — Ambulatory Visit (INDEPENDENT_AMBULATORY_CARE_PROVIDER_SITE_OTHER): Payer: Self-pay | Admitting: Obstetrics & Gynecology

## 2011-10-22 VITALS — BP 128/79 | HR 80 | Temp 99.4°F | Ht 63.0 in | Wt 129.8 lb

## 2011-10-22 DIAGNOSIS — R102 Pelvic and perineal pain: Secondary | ICD-10-CM

## 2011-10-22 DIAGNOSIS — N949 Unspecified condition associated with female genital organs and menstrual cycle: Secondary | ICD-10-CM

## 2011-10-22 NOTE — Progress Notes (Signed)
Patient seen today for evaluation and management of chronic pelvic pain, claims a history of endometriosis but has never had laparoscopic/pathologic diagnosis of endometriosis. She also requests Percocet.  A lengthy discussion ensued with the patient; recommended diagnostic laparoscopy for evaluation of her chronic pain and other non-narcotic therapies given her long history of narcotic dependence. Patient became very agitated and wanted Percocet prescribed because she may go through withdrawal. She was told she needs to follow up with a drug dependency clinic; narcotic will not be dispensed in this clinic to prevent withdrawal.  Recommended trial of Depo Provera to see if her pain could be helped, also recommended Neurontin but she became very angry and stormed out of the room; refusing to talk about surgery or any other non-narcotic interventions.  I am very concerned about this patient and her narcotic dependence.  I do not feel that it is in her best interest to continue receiving narcotics for chronic pelvic pain when she is not willing to undergo proper evaluation and management for this problem.  Total encounter time: 30 minutes

## 2011-10-22 NOTE — Patient Instructions (Addendum)

## 2011-10-24 ENCOUNTER — Encounter: Payer: Self-pay | Admitting: Family Medicine

## 2011-10-24 ENCOUNTER — Ambulatory Visit (INDEPENDENT_AMBULATORY_CARE_PROVIDER_SITE_OTHER): Payer: Self-pay | Admitting: Family Medicine

## 2011-10-24 VITALS — BP 121/82 | HR 87 | Temp 97.5°F | Wt 130.0 lb

## 2011-10-24 DIAGNOSIS — F1193 Opioid use, unspecified with withdrawal: Secondary | ICD-10-CM

## 2011-10-24 DIAGNOSIS — F1120 Opioid dependence, uncomplicated: Secondary | ICD-10-CM

## 2011-10-24 DIAGNOSIS — F1123 Opioid dependence with withdrawal: Secondary | ICD-10-CM

## 2011-10-24 DIAGNOSIS — F329 Major depressive disorder, single episode, unspecified: Secondary | ICD-10-CM

## 2011-10-24 DIAGNOSIS — G8929 Other chronic pain: Secondary | ICD-10-CM

## 2011-10-24 DIAGNOSIS — F32A Depression, unspecified: Secondary | ICD-10-CM

## 2011-10-24 MED ORDER — TRAMADOL HCL 50 MG PO TABS: 50.0000 mg | ORAL_TABLET | Freq: Three times a day (TID) | ORAL | Status: DC | PRN

## 2011-10-24 MED ORDER — CITALOPRAM HYDROBROMIDE 10 MG PO TABS: 10.0000 mg | ORAL_TABLET | Freq: Every day | ORAL | Status: DC

## 2011-10-25 ENCOUNTER — Telehealth: Payer: Self-pay | Admitting: Family Medicine

## 2011-10-25 LAB — DRUG SCREEN URINE W/ALC, NO CONF
Amphetamine Screen, Ur: NEGATIVE
Benzodiazepines.: NEGATIVE
Creatinine,U: 41 mg/dL
Marijuana Metabolite: NEGATIVE
Phencyclidine (PCP): NEGATIVE
Propoxyphene: NEGATIVE

## 2011-10-25 NOTE — Telephone Encounter (Signed)
The name of the Pain Clinic is [facility].  The referral needs to be sent to the attention of [name] fax # [phone].  Also, she said that Dr. Aviva Signs forgot to give her the Rx for the Ambien.  She would like it sent to [facility].

## 2011-10-25 NOTE — Assessment & Plan Note (Signed)
PHQ-9 scored 19 with 0 on item 9.  Pt on Zoloft. Celexa would be a good SSRI to transition pt with depression and chronic pain but affordability is a concern. Pt also has been on Zoloft for long term.  Plan: If pt can afford Celexa I will start with low dose and see response. Then possible increase dose of Celexa and discontinue Zoloft.  If Celexa is not an option, Pt should continue with Zoloft and schedule an appointment to discuss it.
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
## 2011-10-25 NOTE — Patient Instructions (Addendum)
Take medication as prescribed. Make a follow up appointment in 2 weeks.

## 2011-10-25 NOTE — Assessment & Plan Note (Signed)
I share the concern of narcotic dependence. I initially prescribed Tramadol to transition pt into a less stronger opioid and wean medication. As the visit developed I felt that this could potentially harm pt further since pt has no control over what is prescribed. There is no current diagnosis for her chronic abdominal pain. CT and U/S are negative. OBGYN evaluation was also non-revealing of the cause. Plan: I am not comfortable prescribing narcotic medication for a chronic pain of unknown etiology. I would prescribe Non-steroid antiinflammatory medication until results are more conclusive. Narcotic withdrawal is not a life threatening event. Narcotic abuse is. Will obtain UDS today.

## 2011-10-26 ENCOUNTER — Other Ambulatory Visit: Payer: Self-pay | Admitting: Family Medicine

## 2011-10-26 DIAGNOSIS — F1123 Opioid dependence with withdrawal: Secondary | ICD-10-CM

## 2011-10-26 DIAGNOSIS — G8929 Other chronic pain: Secondary | ICD-10-CM

## 2011-10-26 DIAGNOSIS — G4700 Insomnia, unspecified: Secondary | ICD-10-CM

## 2011-10-26 MED ORDER — ZOLPIDEM TARTRATE 10 MG PO TABS: 10.0000 mg | ORAL_TABLET | Freq: Every evening | ORAL | Status: DC | PRN

## 2011-10-26 NOTE — Telephone Encounter (Signed)
Patient notified and RX placed in file in front office for pick up.  She again asks about pain clinic referral and wants to be referred to [facility] see note below. Will forward to white team .

## 2011-10-26 NOTE — Telephone Encounter (Signed)
I did not forget. Pt dis not ask me for the prescription. Please refer to the visit documentation about what was discussed in the encounter. The prescription has to be picked up here. I have given it to our triage nurse.

## 2011-10-29 NOTE — Telephone Encounter (Signed)
Spoke with patient and asked about her insurance that she has at the moment. She now has the orange card. I explained to her that the pain clinic she requested [facility] does not accept that card and that she would have to pay out of pocket. She then declined this referral. We will once [facility] follow [facility] and go from there.Linda Cervantes

## 2011-11-06 ENCOUNTER — Other Ambulatory Visit: Payer: Self-pay | Admitting: Family Medicine

## 2011-11-13 ENCOUNTER — Other Ambulatory Visit: Payer: Self-pay | Admitting: Family Medicine

## 2011-11-29 ENCOUNTER — Emergency Department (HOSPITAL_COMMUNITY)
Admission: EM | Admit: 2011-11-29 | Discharge: 2011-11-29 | Disposition: A | Discharge status: Home / Self Care | Payer: Self-pay | Attending: Emergency Medicine | Admitting: Emergency Medicine

## 2011-11-29 ENCOUNTER — Encounter (HOSPITAL_COMMUNITY): Payer: Self-pay | Admitting: Emergency Medicine

## 2011-11-29 ENCOUNTER — Emergency Department (HOSPITAL_COMMUNITY): Payer: Self-pay

## 2011-11-29 DIAGNOSIS — W108XXA Fall (on) (from) other stairs and steps, initial encounter: Secondary | ICD-10-CM | POA: Insufficient documentation

## 2011-11-29 DIAGNOSIS — K219 Gastro-esophageal reflux disease without esophagitis: Secondary | ICD-10-CM | POA: Insufficient documentation

## 2011-11-29 DIAGNOSIS — W19XXXA Unspecified fall, initial encounter: Secondary | ICD-10-CM

## 2011-11-29 DIAGNOSIS — F17200 Nicotine dependence, unspecified, uncomplicated: Secondary | ICD-10-CM | POA: Insufficient documentation

## 2011-11-29 DIAGNOSIS — S2239XA Fracture of one rib, unspecified side, initial encounter for closed fracture: Secondary | ICD-10-CM | POA: Insufficient documentation

## 2011-11-29 IMAGING — CR DG LUMBAR SPINE COMPLETE 4+V
5 series · 5 of 5 positions shown · non-contrast
Comparison: CT of abdomen and pelvis [DATE].

CLINICAL DATA: Back pain.

LUMBAR SPINE - COMPLETE 4+ VIEW

[t l-spine a.p.]
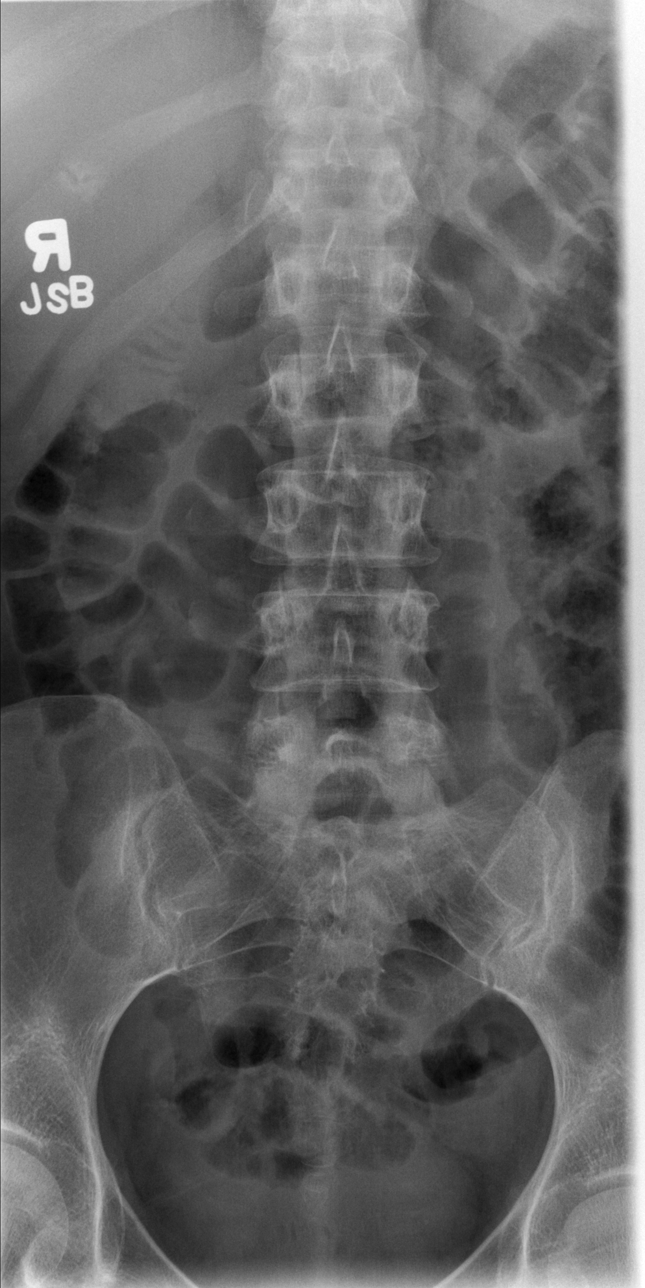

[t l-spine oblique exposure (1 of 2)]
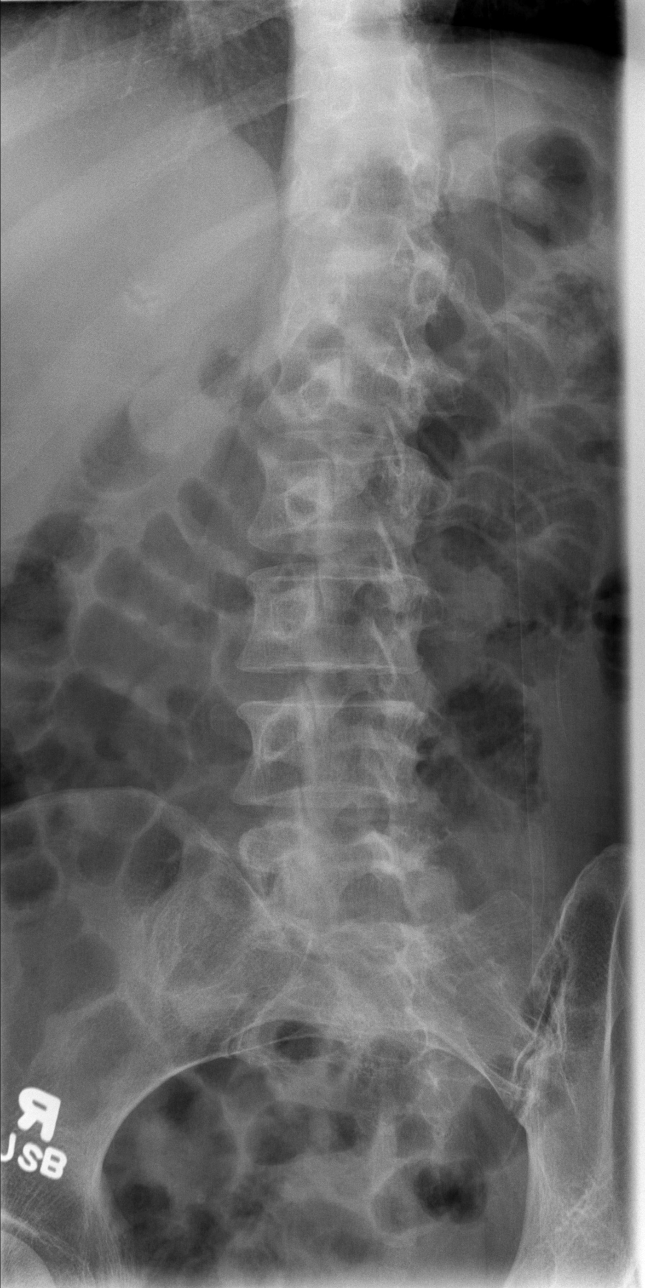

[t l-spine oblique exposure (2 of 2)]
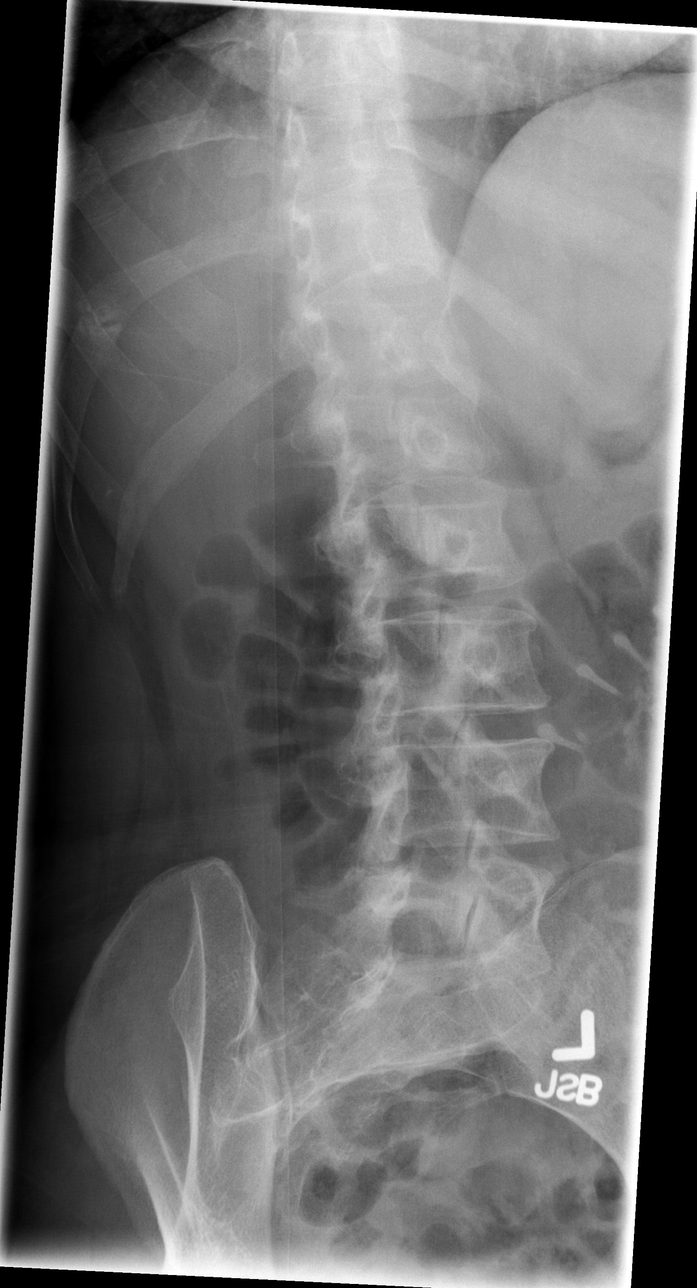

[t l-spine lat]
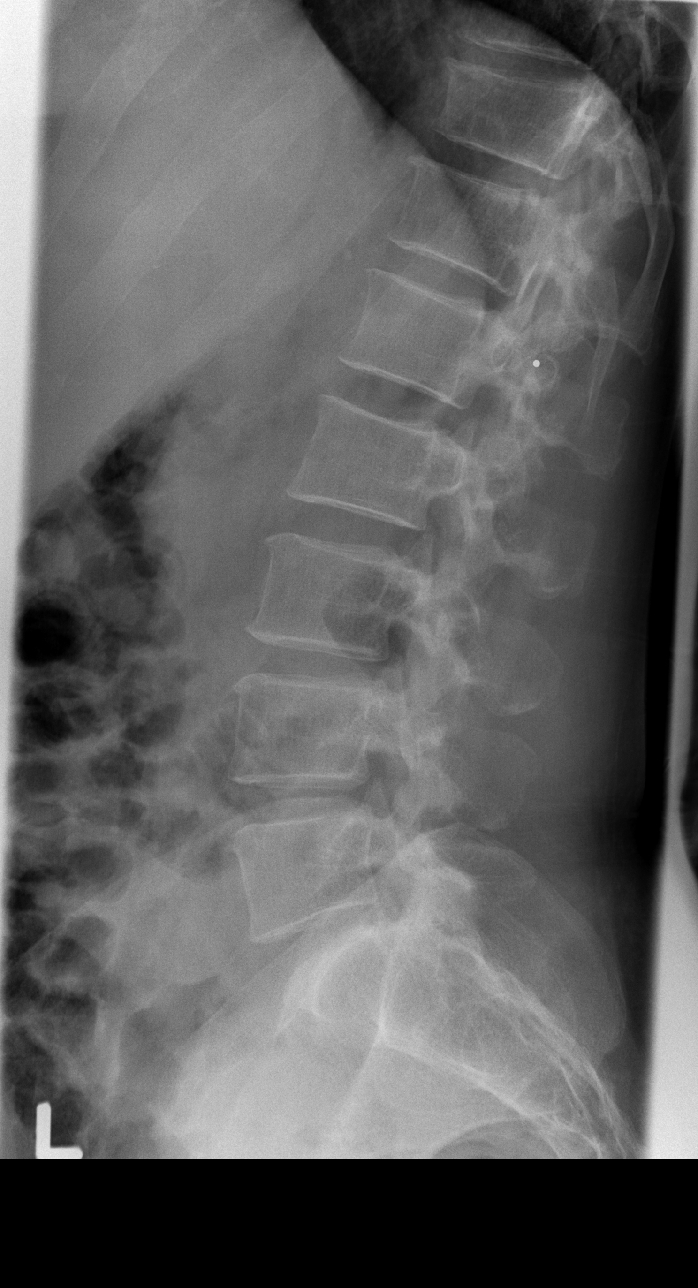

[t l-spine l5-s1 spot]
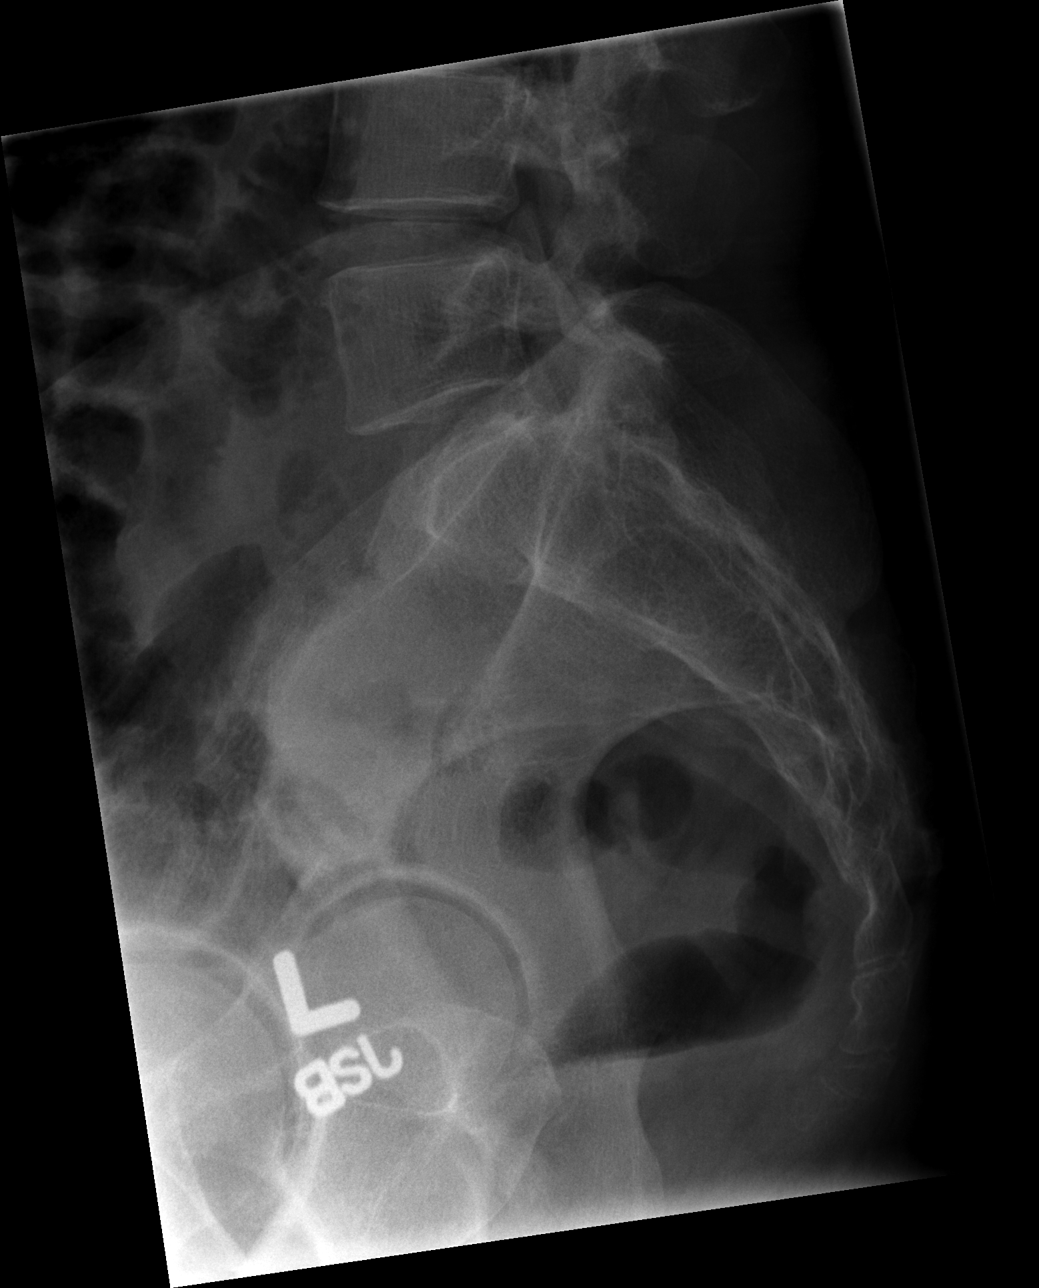

[5 of 5 positions shown; findings below may reference images not displayed]

FINDINGS: Multiple views of the lumbar spine demonstrate no acute
displaced fractures or compression type fractures of the spine.
There is an old compression fracture of T11 with approximately 15%
loss of anterior vertebral body height which is unchanged.  Mild
multilevel degenerative disc disease and facet arthropathy is
noted, most pronounced at L5-S1.  Also incidentally noted is a old
healed fracture of the posterior aspect of the right eleventh rib
(unchanged).
IMPRESSION: 1.  No acute radiographic abnormality of the lumbar spine.
2.  Old compression fracture of T11 with approximate 15% loss of
anterior vertebral body height is unchanged.
3.  Mild degenerative disc disease and lumbar spondylosis, as
above.
4.  Old healed fracture of the posterior aspect of the right
eleventh rib.

## 2011-11-29 IMAGING — CR DG RIBS W/ CHEST 3+V*L*
3 series · 3 of 3 positions shown · non-contrast
Comparison: Chest x-ray [DATE].

CLINICAL DATA: History of a fall complaining of back pain.  Left-
sided chest pain.

LEFT RIBS AND CHEST - 3+ VIEW

[t chest supine]
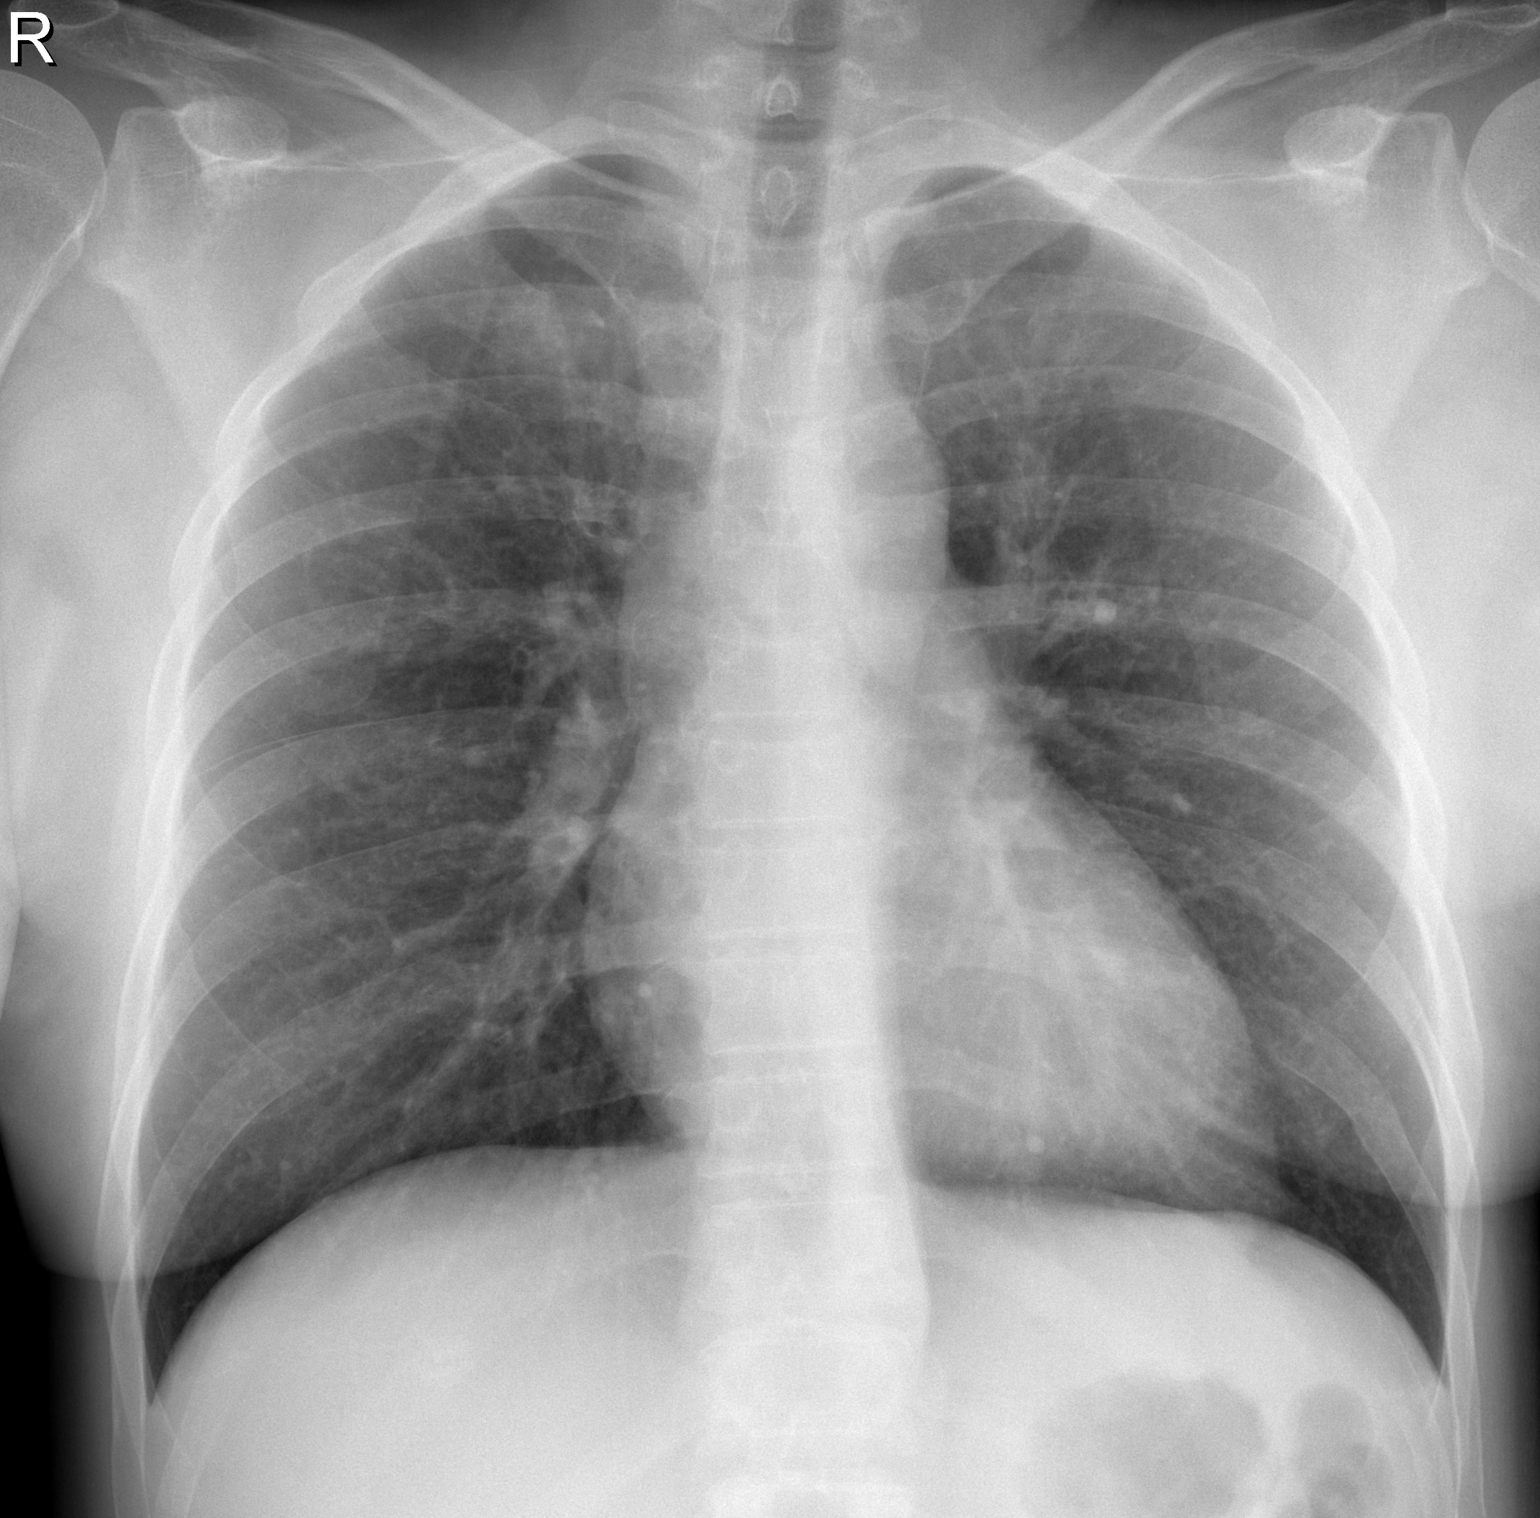

[t ribs ap/pa upper left]
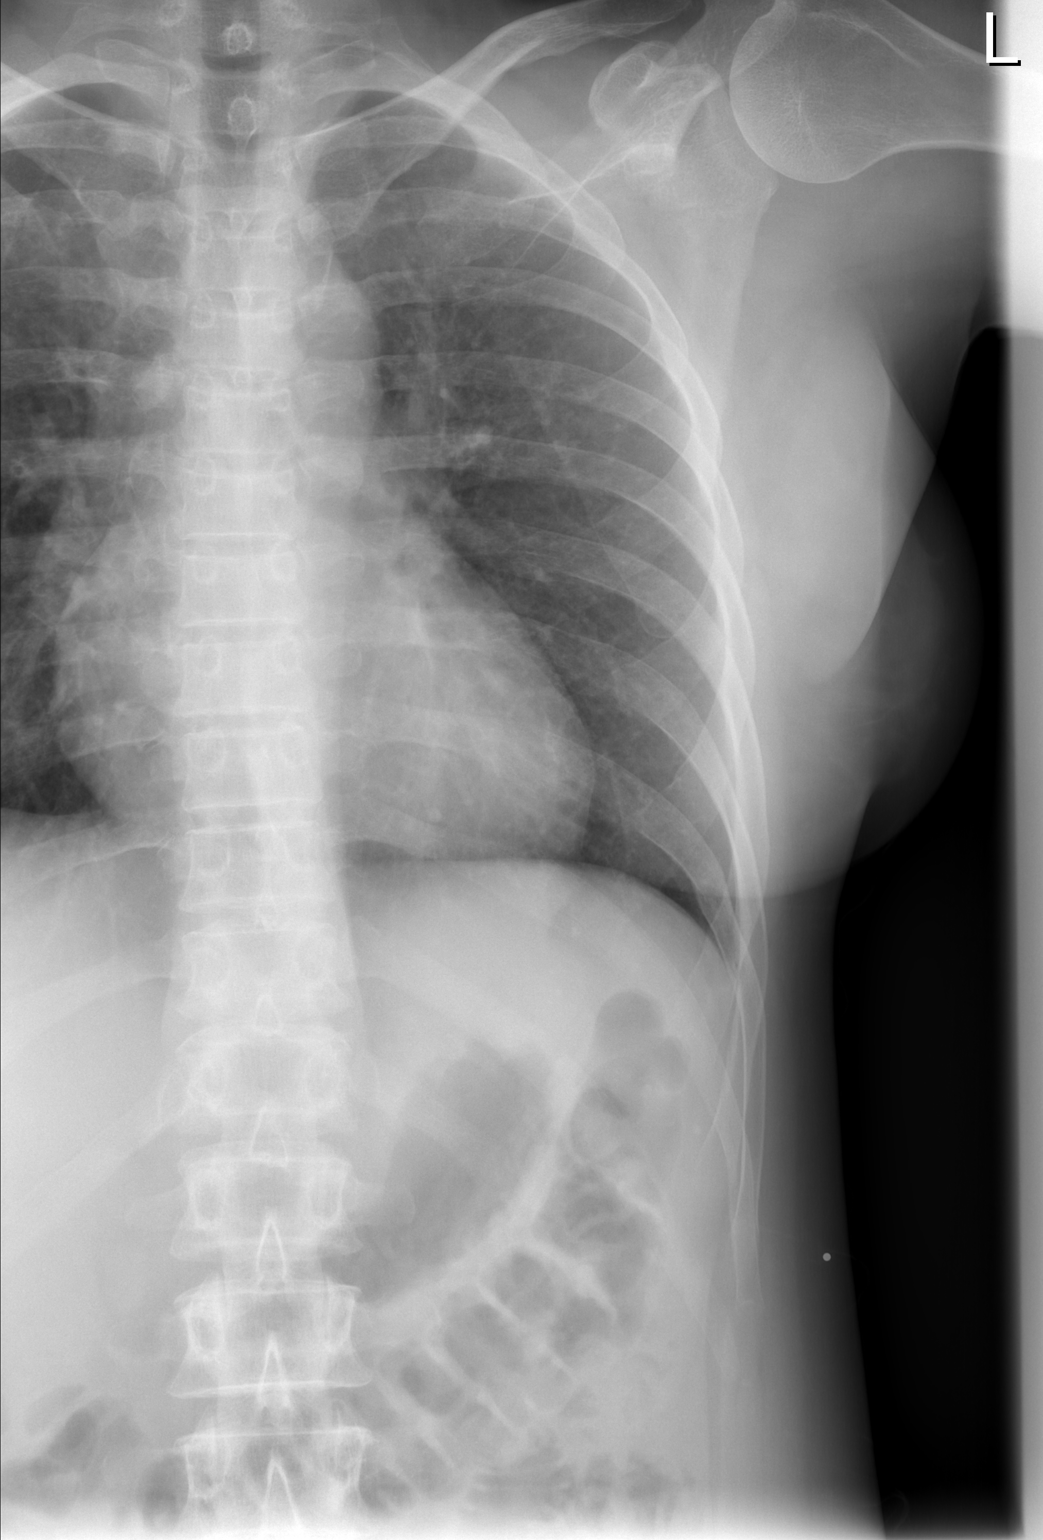

[t ribs obl. left]
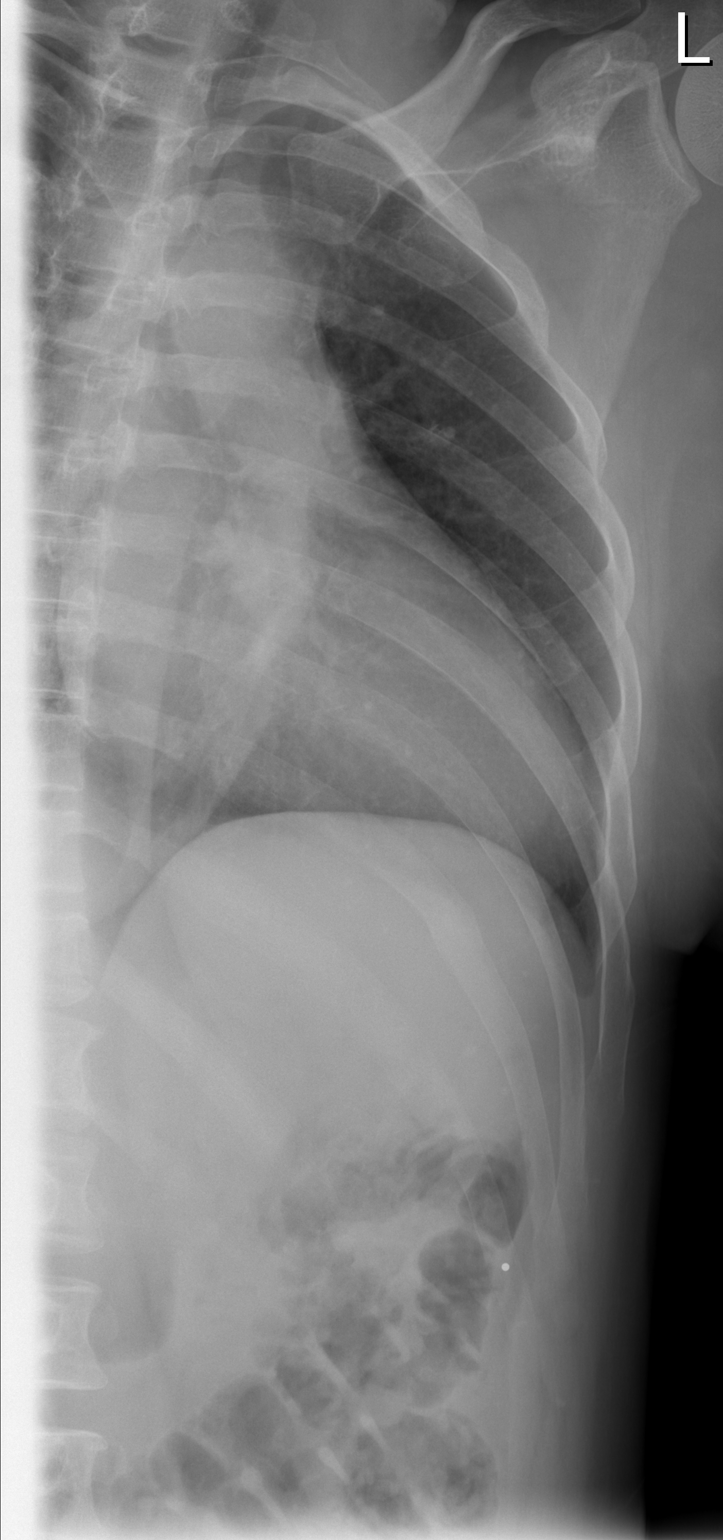

[3 of 3 positions shown; findings below may reference images not displayed]

FINDINGS: Lung volumes are normal.  No consolidative airspace
disease.  No pleural effusions.  No pneumothorax.  No pulmonary
nodule or mass noted.  Pulmonary vasculature and the
cardiomediastinal silhouette are within normal limits.

There is a subtle nondisplaced fracture of the posterolateral
aspect of the left ninth rib which is in close proximity to the
radiopaque marker placed over the patient's point of tenderness.
No other acute displaced fractures are identified.  Old healed
fracture of the posterior right 11th rib is incidentally noted.
IMPRESSION: 1.  Subtle nondisplaced fracture of the posterolateral aspect of
the left ninth rib.
2.  No other radiographic evidence of significant acute traumatic
injury to the thorax.

## 2011-11-29 MED ORDER — TRAMADOL HCL 50 MG PO TABS: 50.0000 mg | ORAL_TABLET | Freq: Four times a day (QID) | ORAL | Status: AC | PRN

## 2011-11-29 MED ORDER — METHOCARBAMOL 500 MG PO TABS: 500.0000 mg | ORAL_TABLET | Freq: Two times a day (BID) | ORAL | Status: AC

## 2011-11-29 MED ORDER — OXYCODONE-ACETAMINOPHEN 5-325 MG PO TABS
2.0000 | ORAL_TABLET | Freq: Once | ORAL | Status: AC
  Administered 2011-11-29: 2 via ORAL
  Filled 2011-11-29: qty 2

## 2011-11-29 NOTE — ED Provider Notes (Signed)
History     CSN: [id number]  Arrival date & time 11/29/11  1426   First MD Initiated Contact with Patient 11/29/11 1729      Chief Complaint  Patient presents with  . Back Pain    (Consider location/radiation/quality/duration/timing/severity/associated sxs/prior treatment) HPI Comments: Patient presents today with a chief complaint of fall.  She reports that last evening she fell down several stairs and landed on the left side of her body.  She was wearing high heels and tripped.  She did not hit her head or lose consciousness.  She is currently having pain of her lower back, left side of her lower ribcage, left knee, and her sacrum.  She has taken Ibuprofen and Tylenol for pain, but does not feel that it has helped.  She was ambulatory after the fall.  She denies numbness, tingling, headache, vision changes, nausea or vomiting.  The history is provided by the patient.    Past Medical History  Diagnosis Date  . Anxiety   . Depression   . Substance abuse Clean as of 2009    Crack cocaine  . GERD (gastroesophageal reflux disease)   . Heart murmur   . Back pain   . Endometriosis   . 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    Past Surgical History  Procedure Date  . Cesarean section   . Tubal ligation 2000  . Foot surgery     Family History  Problem Relation Age of Onset  . Hypertension Father   . Diabetes Maternal Grandmother     History  Substance Use Topics  . Smoking status: Current Everyday Smoker -- 1.0 packs/day    Types: Cigarettes  . Smokeless tobacco: Never Used  . Alcohol Use: No    OB History    Grav Para Term Preterm Abortions TAB SAB Ect Mult Living   3 3 3       3       Review of Systems  Constitutional: Negative for fever and chills.  HENT: Negative for neck pain and neck stiffness.   Eyes: Negative for visual disturbance.  Gastrointestinal: Negative for nausea and vomiting.  Musculoskeletal: Positive for back pain. Negative for gait  problem.  Neurological: Negative for dizziness, syncope, light-headedness and headaches.  Hematological: Does not bruise/bleed easily.  Psychiatric/Behavioral: Negative for confusion.    Allergies  Amitriptyline hcl  Home Medications   Current Outpatient Rx  Name Route Sig Dispense Refill  . ACETAMINOPHEN 500 MG PO TABS Oral Take 1,000 mg by mouth every 6 (six) hours as needed. For back pain    . ALBUTEROL SULFATE HFA 108 (90 BASE) MCG/ACT IN AERS Inhalation Inhale 2 puffs into the lungs every 6 (six) hours as needed. For shortness of breath 8.5 g 1  . CITALOPRAM HYDROBROMIDE 10 MG PO TABS Oral Take 1 tablet (10 mg total) by mouth daily. 30 tablet 0  . IBUPROFEN 200 MG PO TABS Oral Take 600 mg by mouth every 6 (six) hours as needed. Takes for pain    . MELOXICAM 15 MG PO TABS Oral Take 1 tablet (15 mg total) by mouth daily. 30 tablet 1  . QUETIAPINE FUMARATE 100 MG PO TABS Oral Take 100 mg by mouth at bedtime.    . SERTRALINE HCL 100 MG PO TABS Oral Take 100 mg by mouth daily.      BP 138/87  Pulse 84  Temp 98.6 F (37 C)  Resp 16  SpO2 100%  LMP 11/29/2011  Physical Exam  Nursing note and vitals reviewed. Constitutional: She appears well-developed and well-nourished. No distress.  HENT:  Head: Normocephalic and atraumatic.  Mouth/Throat: Oropharynx is clear and moist.  Eyes: EOM are normal. Pupils are equal, round, and reactive to light.  Neck: Normal range of motion. Neck supple.  Cardiovascular: Normal rate, regular rhythm and normal heart sounds.   Pulses:      Dorsalis pedis pulses are 2+ on the right side, and 2+ on the left side.  Pulmonary/Chest: Effort normal and breath sounds normal. She exhibits tenderness.       Tenderness to palpation of the left lower ribcage  Musculoskeletal: Normal range of motion.       Left knee: She exhibits ecchymosis. She exhibits no swelling, no effusion and no deformity. tenderness found.       Cervical back: She exhibits no  tenderness, no bony tenderness, no swelling, no edema and no deformity.       Thoracic back: She exhibits no bony tenderness, no swelling, no edema and no deformity.       Lumbar back: She exhibits tenderness and bony tenderness. She exhibits normal range of motion, no swelling, no edema and no deformity.       Tenderness to palpation and small bruise over the left sacrum  Neurological: She is alert. She has normal strength. No cranial nerve deficit or sensory deficit. Gait normal.  Skin: Skin is warm and dry. She is not diaphoretic.  Psychiatric: She has a normal mood and affect.    ED Course  Procedures (including critical care time)  Labs Reviewed - No data to display Dg Ribs Unilateral W/chest Left  11/29/2011  *RADIOLOGY REPORT*  Clinical Data: History of a fall complaining of back pain.  Left- sided chest pain.  LEFT RIBS AND CHEST - 3+ VIEW  Comparison: Chest x-ray 02/14/2011.  Findings: Lung volumes are normal.  No consolidative airspace disease.  No pleural effusions.  No pneumothorax.  No pulmonary nodule or mass noted.  Pulmonary vasculature and the cardiomediastinal silhouette are within normal limits.  There is a subtle nondisplaced fracture of the posterolateral aspect of the left ninth rib which is in close proximity to the radiopaque marker placed over the patient's point of tenderness. No other acute displaced fractures are identified.  Old healed fracture of the posterior right 11th rib is incidentally noted.  IMPRESSION: 1.  Subtle nondisplaced fracture of the posterolateral aspect of the left ninth rib. 2.  No other radiographic evidence of significant acute traumatic injury to the thorax.   Original Report Authenticated By: Florencia Reasons, M.D.    Dg Lumbar Spine Complete  11/29/2011  *RADIOLOGY REPORT*  Clinical Data: Back pain.  LUMBAR SPINE - COMPLETE 4+ VIEW  Comparison: CT of abdomen and pelvis 05/03/2011.  Findings: Multiple views of the lumbar spine demonstrate no  acute displaced fractures or compression type fractures of the spine. There is an old compression fracture of T11 with approximately 15% loss of anterior vertebral body height which is unchanged.  Mild multilevel degenerative disc disease and facet arthropathy is noted, most pronounced at L5-S1.  Also incidentally noted is a old healed fracture of the posterior aspect of the right eleventh rib (unchanged).  IMPRESSION: 1.  No acute radiographic abnormality of the lumbar spine. 2.  Old compression fracture of T11 with approximate 15% loss of anterior vertebral body height is unchanged. 3.  Mild degenerative disc disease and lumbar spondylosis, as above. 4.  Old healed fracture of the posterior  aspect of the right eleventh rib.   Original Report Authenticated By: Florencia Reasons, M.D.    Dg Sacrum/coccyx  11/29/2011  *RADIOLOGY REPORT*  Clinical Data: Back pain.  SACRUM AND COCCYX - 2+ VIEW  Comparison: No priors.  Findings: Three views of the sacrum and coccyx demonstrate no acute displaced fractures.  Visualized portions of the bony pelvis are intact.  IMPRESSION: 1.  No acute displaced fracture of the sacrum or coccyx.   Original Report Authenticated By: Florencia Reasons, M.D.    Dg Knee Complete 4 Views Left  11/29/2011  *RADIOLOGY REPORT*  Clinical Data: Knee pain.  LEFT KNEE - COMPLETE 4+ VIEW  Comparison: No priors.  Findings: Four views of the left knee demonstrate no acute fracture, subluxation, dislocation, joint or soft tissue abnormality.  IMPRESSION: 1.  No acute radiographic abnormality of the left knee.   Original Report Authenticated By: Florencia Reasons, M.D.      No diagnosis found.    MDM  Xrays negative aside from a subtle nondisplaced fracture of the posterolateral aspect of the left ninth rib.  Patient given Incentive Spirometry.  Patient ambulatory.  Patient discharged home and instructed to follow up with PCP.        Pascal Lux Grant, PA-C 11/30/11 1127

## 2011-11-29 NOTE — ED Notes (Signed)
Was wearing heals last night  Fell down 6 steps hurt back

## 2011-11-29 NOTE — ED Notes (Signed)
Patient transported to X-ray 

## 2011-12-01 NOTE — ED Provider Notes (Signed)
Medical screening examination/treatment/procedure(s) were performed by non-physician practitioner and as supervising physician I was immediately available for consultation/collaboration.  Hurman Horn, MD 12/01/11 [phone]

## 2011-12-27 ENCOUNTER — Telehealth: Payer: Self-pay | Admitting: Family Medicine

## 2011-12-27 NOTE — Telephone Encounter (Signed)
Pt is asking about referral to pain clinic - she hasn't heard anything yet.

## 2011-12-27 NOTE — Telephone Encounter (Signed)
Dr. Deirdre Priest please read this telephone communication and notes from office visit with me, Dr. Gwendolyn Grant and Dr. Epifanio Lesches (OBGYN).

## 2011-12-27 NOTE — Telephone Encounter (Signed)
Spoke with patient and she was very upset about the pain clinic referral. I have spoken to her about this in previous phone notes. Patient is wanting to go to pain clinic for a chronic pelvic pain. [facility] due to patient having the orange card and they stated that it was declined due to them not handling pelvic pain there. I explained this to the patient and she was very outspoken on words. She wanted to know how we could referral her some where when we know the do not accept pelvic pain as a diagnosis. I explained to her that when we refer out we do not know specifically what they are accepting at the time. She then got mad and said the f word many times and stated that she was going to sue [place] all and call an attorney because we cannot get her the care she needs. I did go over all of the phone notes with her and the prices of the other pain clinics that I have at my desk. I have tried to help this patient many of times which she did acknowledge, but then said "that doesn't matter anymore I am going to sue you also" Then she hung up.

## 2011-12-28 NOTE — Telephone Encounter (Signed)
I will be happy to discuss and explore options.  Contact me [phone] or drop by my office or preceptor room

## 2012-01-02 ENCOUNTER — Telehealth: Payer: Self-pay | Admitting: Family Medicine
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
## 2012-01-09 ENCOUNTER — Ambulatory Visit: Payer: Self-pay | Admitting: Family Medicine

## 2012-01-18 ENCOUNTER — Ambulatory Visit (INDEPENDENT_AMBULATORY_CARE_PROVIDER_SITE_OTHER): Payer: Self-pay | Admitting: Family Medicine

## 2012-01-18 ENCOUNTER — Encounter: Payer: Self-pay | Admitting: Family Medicine

## 2012-01-18 VITALS — BP 123/101 | HR 79 | Temp 98.2°F | Wt 137.0 lb

## 2012-01-18 DIAGNOSIS — F329 Major depressive disorder, single episode, unspecified: Secondary | ICD-10-CM

## 2012-01-18 DIAGNOSIS — F1120 Opioid dependence, uncomplicated: Secondary | ICD-10-CM

## 2012-01-18 DIAGNOSIS — N949 Unspecified condition associated with female genital organs and menstrual cycle: Secondary | ICD-10-CM

## 2012-01-18 DIAGNOSIS — G8929 Other chronic pain: Secondary | ICD-10-CM

## 2012-01-18 DIAGNOSIS — F1123 Opioid dependence with withdrawal: Secondary | ICD-10-CM

## 2012-01-18 DIAGNOSIS — R102 Pelvic and perineal pain: Secondary | ICD-10-CM

## 2012-01-18 MED ORDER — NAPROXEN 500 MG PO TABS: 250.0000 mg | ORAL_TABLET | Freq: Two times a day (BID) | ORAL | Status: DC

## 2012-01-18 MED ORDER — CITALOPRAM HYDROBROMIDE 10 MG PO TABS: 10.0000 mg | ORAL_TABLET | Freq: Every day | ORAL | Status: DC

## 2012-01-18 NOTE — Assessment & Plan Note (Addendum)
Pt would like to go again to [facility] for evaluation. She agrees with laparoscopy at this time. I explained what this procedure entails and she voiced understanding.  Plan: Naproxen PRN Referral to GYN given today. Discussed addiction problem and the need to be sober and clean from drugs to go for an elective procedure that requires anesthesia

## 2012-01-18 NOTE — Patient Instructions (Addendum)
Your addiction is a problem and there are resources to help you out. Please contact them.  [facility] email : [email or web address] Phone : [phone] Fax : [phone] Visit [place] : [address] [place], [place], [place] [address]  For your abdominal pain we can not give you opioids. Naproxen has been sent to your pharmacy you can take it as prescribed for pain.  We have placed a referral for gynecology consult to evaluate your pelvic pain.

## 2012-01-18 NOTE — Assessment & Plan Note (Signed)
Discussed with pt in depth that on her best interest narcotics will NOT be prescribed in this clinic.Also discussed with pt her addiction and resources were given to address this problem ([facility] info)

## 2012-01-18 NOTE — Progress Notes (Signed)
  Family Medicine Office Visit Note   Subjective:   Patient ID: Linda Cervantes, female  DOB: 1965-05-05, 46 y.o.. MRN: [id number]   Pt that comes today with the complaint of abdominal pain. She apologized to our staff Jimmy Footman) for cursing over the phone and promised she will not do this again.   Abdominal pain: Pain still present and she will like to get evaluated with laparoscopy offered on her prior appointment at [facility] She is asking for narcotic medication to alleviate her pain.   Substance abuse: pt states that she buys Percocet off the street and is taking it, that  why she is experiencing withdraw symptoms. She also confess alcohol abuse and getting drunk very often. She takes 12-pack beer and 1 pint liquor every other day. She is not ready to quit. Pt demands me to prescribe opioids for her pain so she "does not have to buy it off the street".  Review of Systems:  Per HPI  Objective:   Physical Exam: Gen:  NAD, slow speech, pt has alcohol smell on her breath today.  HEENT: Moist mucous membranes  CV: Regular rate and rhythm, no murmurs ABD: Just light palpation reproduces disproportioned response. When distracting pt, abdomen is not acutely tender, nor guarding. Normal BS Neuro: Alert and oriented x3. No focalization.  Assessment & Plan:

## 2012-01-19 NOTE — Assessment & Plan Note (Addendum)
Declines PHQ-9 today. She is not taking Zoloft or Celexa but is asking to increase dose of Seroquel. She was evaluated in mood clinic and seroquel was increased to 100mg  with possible increase to 150-300mg  range per Dr. Kathrynn Running recommendation. Pt has not used [employer] therapy offered on her visit of March/09/2011. Plan: Restart Celexa. Will continue same dose Seroquel at this time since QT prolongation with this two meds is possible. Next visit if pt taking both with no side effects, EKG negative and medially indicated we can consider increasing Seroquel. Recommended and discussed that an important part of her treatment is psychotherapy.

## 2012-12-08 ENCOUNTER — Encounter (HOSPITAL_COMMUNITY): Payer: Self-pay | Admitting: Emergency Medicine

## 2012-12-08 ENCOUNTER — Emergency Department (HOSPITAL_COMMUNITY): Payer: Self-pay

## 2012-12-08 ENCOUNTER — Emergency Department (HOSPITAL_COMMUNITY)
Admission: EM | Admit: 2012-12-08 | Discharge: 2012-12-08 | Disposition: A | Discharge status: Home / Self Care | Payer: Self-pay | Attending: Emergency Medicine | Admitting: Emergency Medicine

## 2012-12-08 DIAGNOSIS — S4980XA Other specified injuries of shoulder and upper arm, unspecified arm, initial encounter: Secondary | ICD-10-CM | POA: Insufficient documentation

## 2012-12-08 DIAGNOSIS — Z79899 Other long term (current) drug therapy: Secondary | ICD-10-CM | POA: Insufficient documentation

## 2012-12-08 DIAGNOSIS — R011 Cardiac murmur, unspecified: Secondary | ICD-10-CM | POA: Insufficient documentation

## 2012-12-08 DIAGNOSIS — Z791 Long term (current) use of non-steroidal anti-inflammatories (NSAID): Secondary | ICD-10-CM | POA: Insufficient documentation

## 2012-12-08 DIAGNOSIS — F329 Major depressive disorder, single episode, unspecified: Secondary | ICD-10-CM | POA: Insufficient documentation

## 2012-12-08 DIAGNOSIS — F17200 Nicotine dependence, unspecified, uncomplicated: Secondary | ICD-10-CM | POA: Insufficient documentation

## 2012-12-08 DIAGNOSIS — M25512 Pain in left shoulder: Secondary | ICD-10-CM

## 2012-12-08 DIAGNOSIS — Z8742 Personal history of other diseases of the female genital tract: Secondary | ICD-10-CM | POA: Insufficient documentation

## 2012-12-08 DIAGNOSIS — F411 Generalized anxiety disorder: Secondary | ICD-10-CM | POA: Insufficient documentation

## 2012-12-08 DIAGNOSIS — S46909A Unspecified injury of unspecified muscle, fascia and tendon at shoulder and upper arm level, unspecified arm, initial encounter: Secondary | ICD-10-CM | POA: Insufficient documentation

## 2012-12-08 DIAGNOSIS — Z8719 Personal history of other diseases of the digestive system: Secondary | ICD-10-CM | POA: Insufficient documentation

## 2012-12-08 DIAGNOSIS — F3289 Other specified depressive episodes: Secondary | ICD-10-CM | POA: Insufficient documentation

## 2012-12-08 IMAGING — CR DG SHOULDER 2+V*L*
3 series · 3 of 3 positions shown · non-contrast
Comparison: No priors.

CLINICAL DATA: Shoulder injury.

LEFT SHOULDER - 2+ VIEW

[w shoulder internal left]
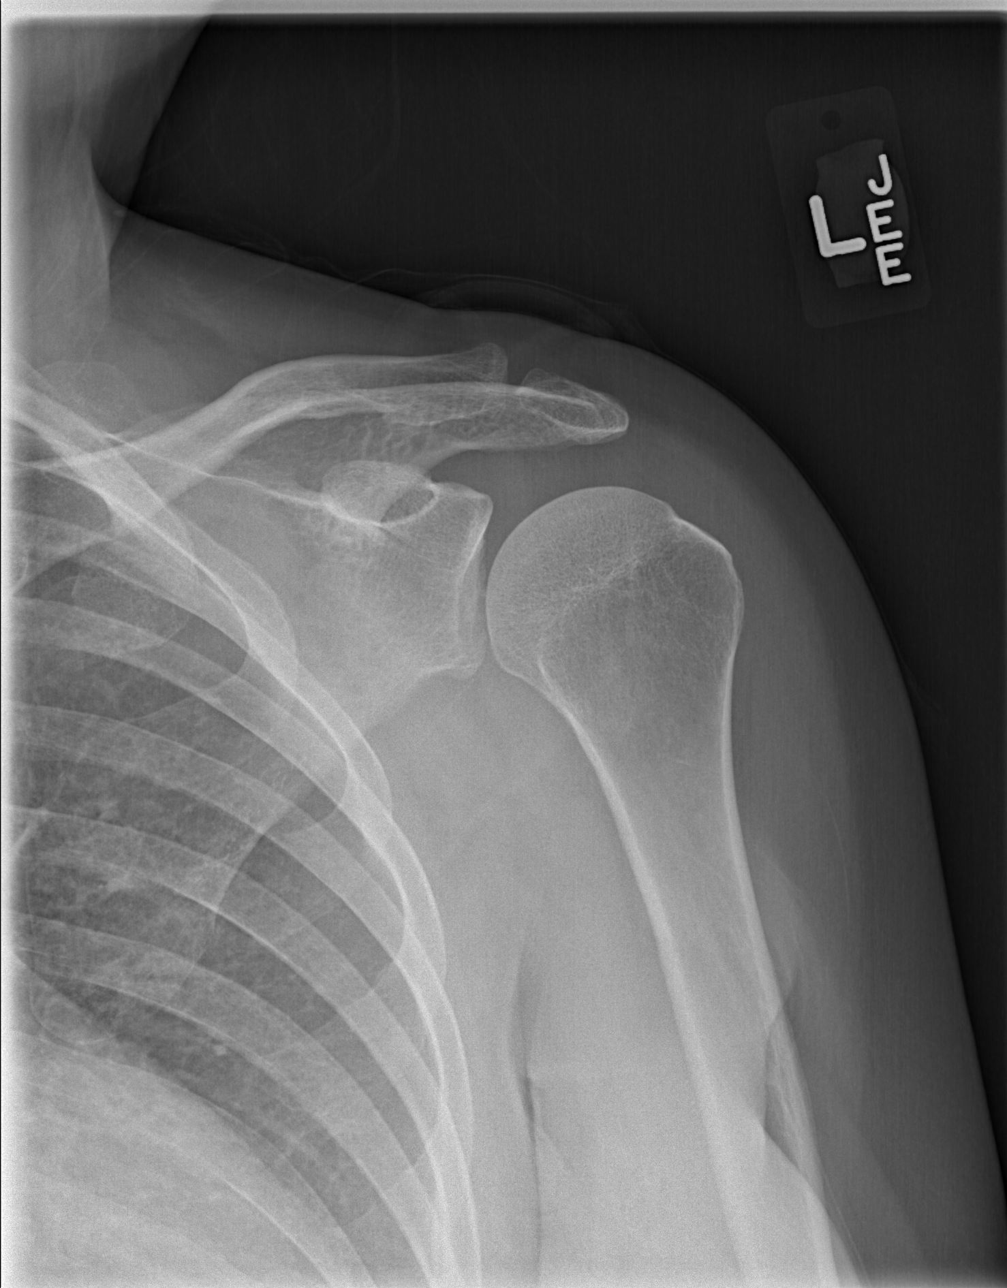

[w shoulder y-view left]
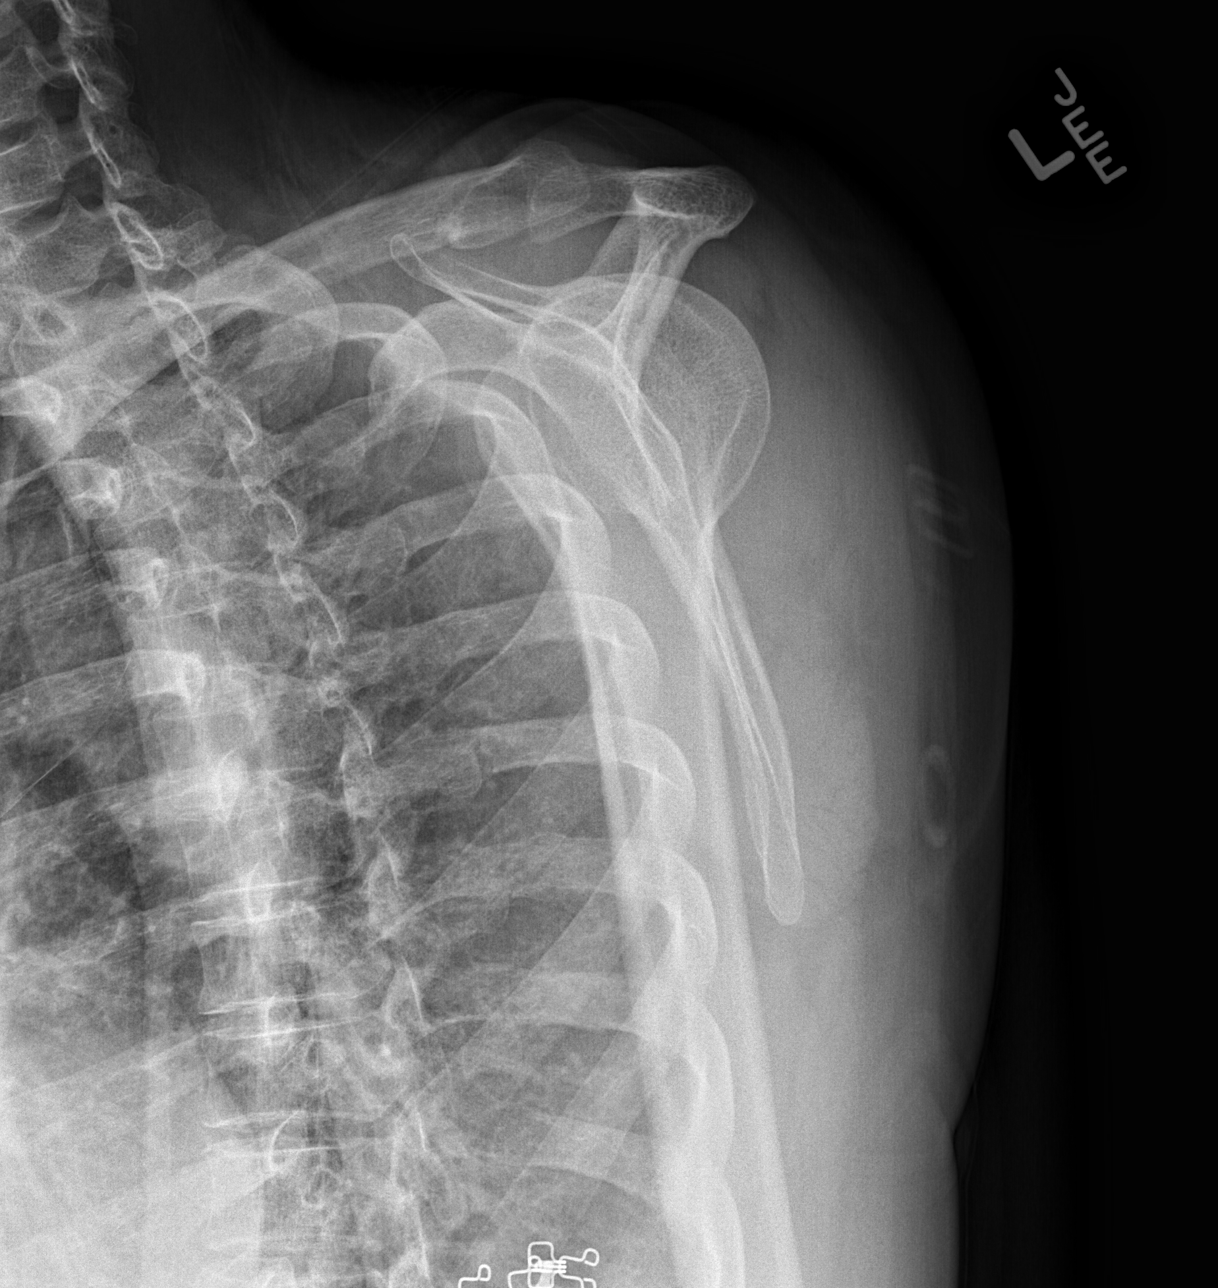

[w shoulder axillary left]
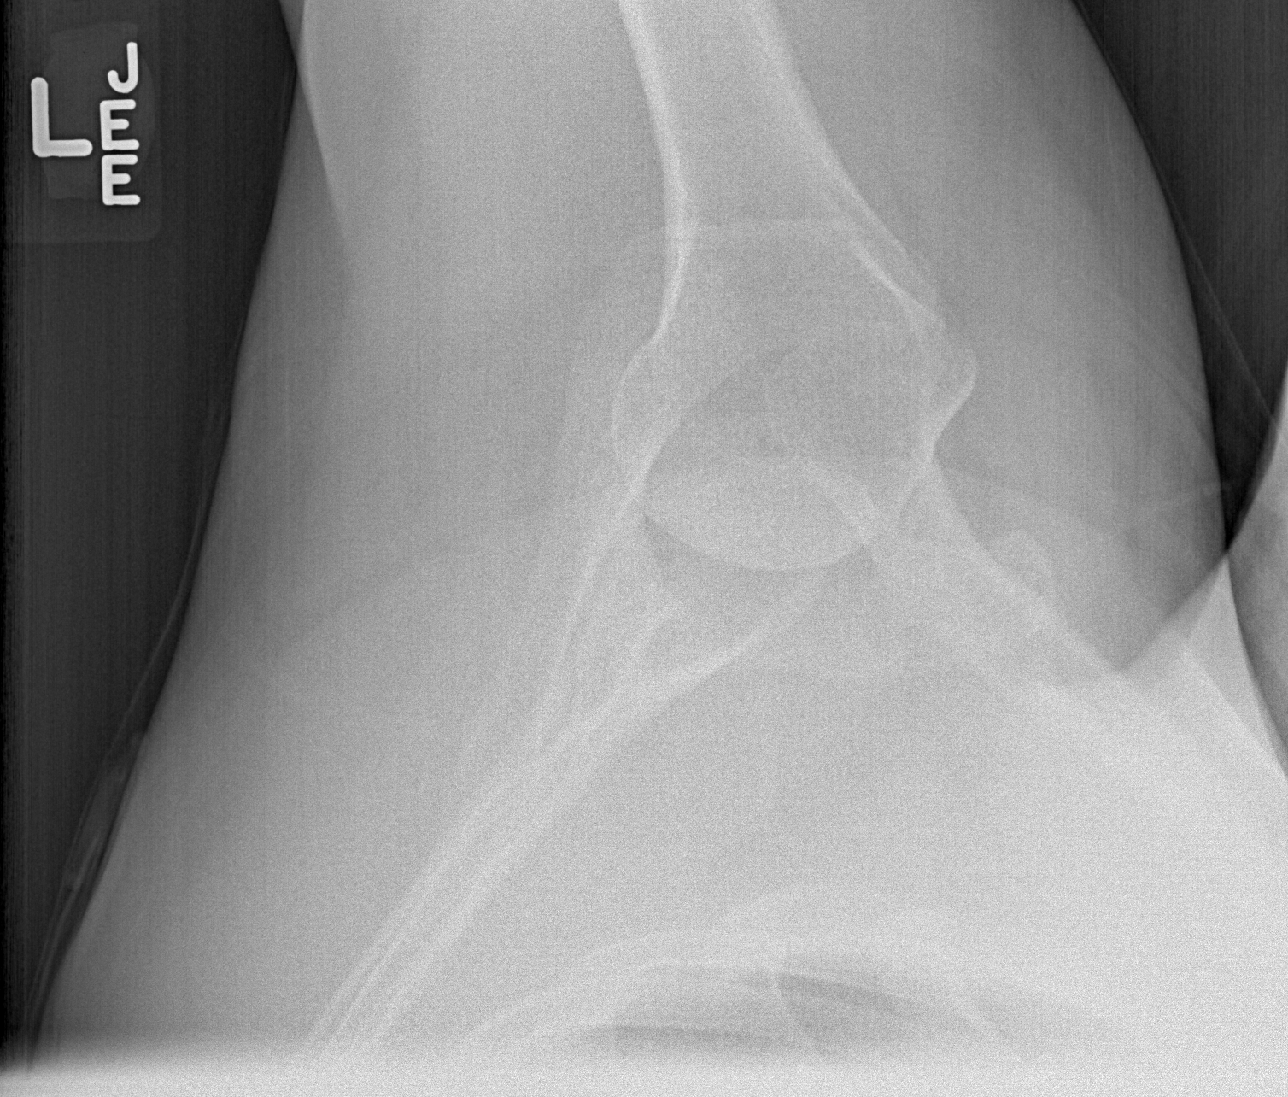

[3 of 3 positions shown; findings below may reference images not displayed]

FINDINGS: Three views of the left shoulder demonstrate no definite
acute displaced fracture, subluxation or dislocation.
IMPRESSION: 1.  No acute radiographic abnormality of the left shoulder.

## 2012-12-08 MED ORDER — HYDROCODONE-ACETAMINOPHEN 5-325 MG PO TABS: ORAL_TABLET | ORAL | Status: DC

## 2012-12-08 MED ORDER — MORPHINE SULFATE 4 MG/ML IJ SOLN
4.0000 mg | Freq: Once | INTRAMUSCULAR | Status: AC
  Administered 2012-12-08: 4 mg via INTRAMUSCULAR
  Filled 2012-12-08: qty 1

## 2012-12-08 MED ORDER — ONDANSETRON 4 MG PO TBDP
4.0000 mg | ORAL_TABLET | Freq: Once | ORAL | Status: AC
  Administered 2012-12-08: 4 mg via ORAL
  Filled 2012-12-08: qty 1
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
## 2012-12-11 NOTE — ED Provider Notes (Signed)
Medical screening examination/treatment/procedure(s) were performed by non-physician practitioner and as supervising physician I was immediately available for consultation/collaboration.   Virtie Bungert, MD 12/11/12 1540 

## 2013-06-25 ENCOUNTER — Emergency Department (HOSPITAL_COMMUNITY): Payer: Self-pay

## 2013-06-25 ENCOUNTER — Emergency Department (HOSPITAL_COMMUNITY)
Admission: EM | Admit: 2013-06-25 | Discharge: 2013-06-26 | Disposition: A | Discharge status: Home / Self Care | Payer: Self-pay | Attending: Emergency Medicine | Admitting: Emergency Medicine

## 2013-06-25 ENCOUNTER — Encounter (HOSPITAL_COMMUNITY): Payer: Self-pay | Admitting: Emergency Medicine

## 2013-06-25 DIAGNOSIS — F329 Major depressive disorder, single episode, unspecified: Secondary | ICD-10-CM | POA: Insufficient documentation

## 2013-06-25 DIAGNOSIS — F411 Generalized anxiety disorder: Secondary | ICD-10-CM | POA: Insufficient documentation

## 2013-06-25 DIAGNOSIS — Z8742 Personal history of other diseases of the female genital tract: Secondary | ICD-10-CM | POA: Insufficient documentation

## 2013-06-25 DIAGNOSIS — F17200 Nicotine dependence, unspecified, uncomplicated: Secondary | ICD-10-CM | POA: Insufficient documentation

## 2013-06-25 DIAGNOSIS — F1410 Cocaine abuse, uncomplicated: Secondary | ICD-10-CM | POA: Insufficient documentation

## 2013-06-25 DIAGNOSIS — R011 Cardiac murmur, unspecified: Secondary | ICD-10-CM | POA: Insufficient documentation

## 2013-06-25 DIAGNOSIS — Z8719 Personal history of other diseases of the digestive system: Secondary | ICD-10-CM | POA: Insufficient documentation

## 2013-06-25 DIAGNOSIS — F1110 Opioid abuse, uncomplicated: Secondary | ICD-10-CM | POA: Insufficient documentation

## 2013-06-25 DIAGNOSIS — F3289 Other specified depressive episodes: Secondary | ICD-10-CM | POA: Insufficient documentation

## 2013-06-25 DIAGNOSIS — J45901 Unspecified asthma with (acute) exacerbation: Secondary | ICD-10-CM | POA: Insufficient documentation

## 2013-06-25 DIAGNOSIS — Z79899 Other long term (current) drug therapy: Secondary | ICD-10-CM | POA: Insufficient documentation

## 2013-06-25 HISTORY — DX: Unspecified asthma, uncomplicated: J45.909

## 2013-06-25 LAB — RAPID URINE DRUG SCREEN, HOSP PERFORMED
AMPHETAMINES: NOT DETECTED
BENZODIAZEPINES: NOT DETECTED
Barbiturates: NOT DETECTED
Cocaine: POSITIVE — AB
Opiates: POSITIVE — AB
TETRAHYDROCANNABINOL: NOT DETECTED

## 2013-06-25 LAB — CBC
HEMATOCRIT: 37.5 % (ref 36.0–46.0)
HEMOGLOBIN: 12.8 g/dL (ref 12.0–15.0)
MCH: 28.9 pg (ref 26.0–34.0)
MCHC: 34.1 g/dL (ref 30.0–36.0)
MCV: 84.7 fL (ref 78.0–100.0)
PLATELETS: 267 10*3/uL (ref 150–400)
RBC: 4.43 MIL/uL (ref 3.87–5.11)
RDW: 12.6 % (ref 11.5–15.5)
WBC: 6 10*3/uL (ref 4.0–10.5)

## 2013-06-25 LAB — BASIC METABOLIC PANEL
BUN: 4 mg/dL — AB (ref 6–23)
CALCIUM: 9.2 mg/dL (ref 8.4–10.5)
CHLORIDE: 102 meq/L (ref 96–112)
CO2: 25 meq/L (ref 19–32)
CREATININE: 0.72 mg/dL (ref 0.50–1.10)
GFR calc Af Amer: >90 mL/min (ref >90)
GFR calc non Af Amer: >90 mL/min (ref >90)
GLUCOSE: 109 mg/dL — AB (ref 70–99)
Potassium: 4.1 mEq/L (ref 3.7–5.3)
Sodium: 137 mEq/L (ref 137–147)

## 2013-06-25 LAB — ETHANOL

## 2013-06-25 IMAGING — CR DG CHEST 2V
2 series · 2 of 2 positions shown · non-contrast
Comparison: None.

CLINICAL DATA: Cough.  Asthma.  Smoker.

EXAM:
CHEST  2 VIEW

[w chest pa]
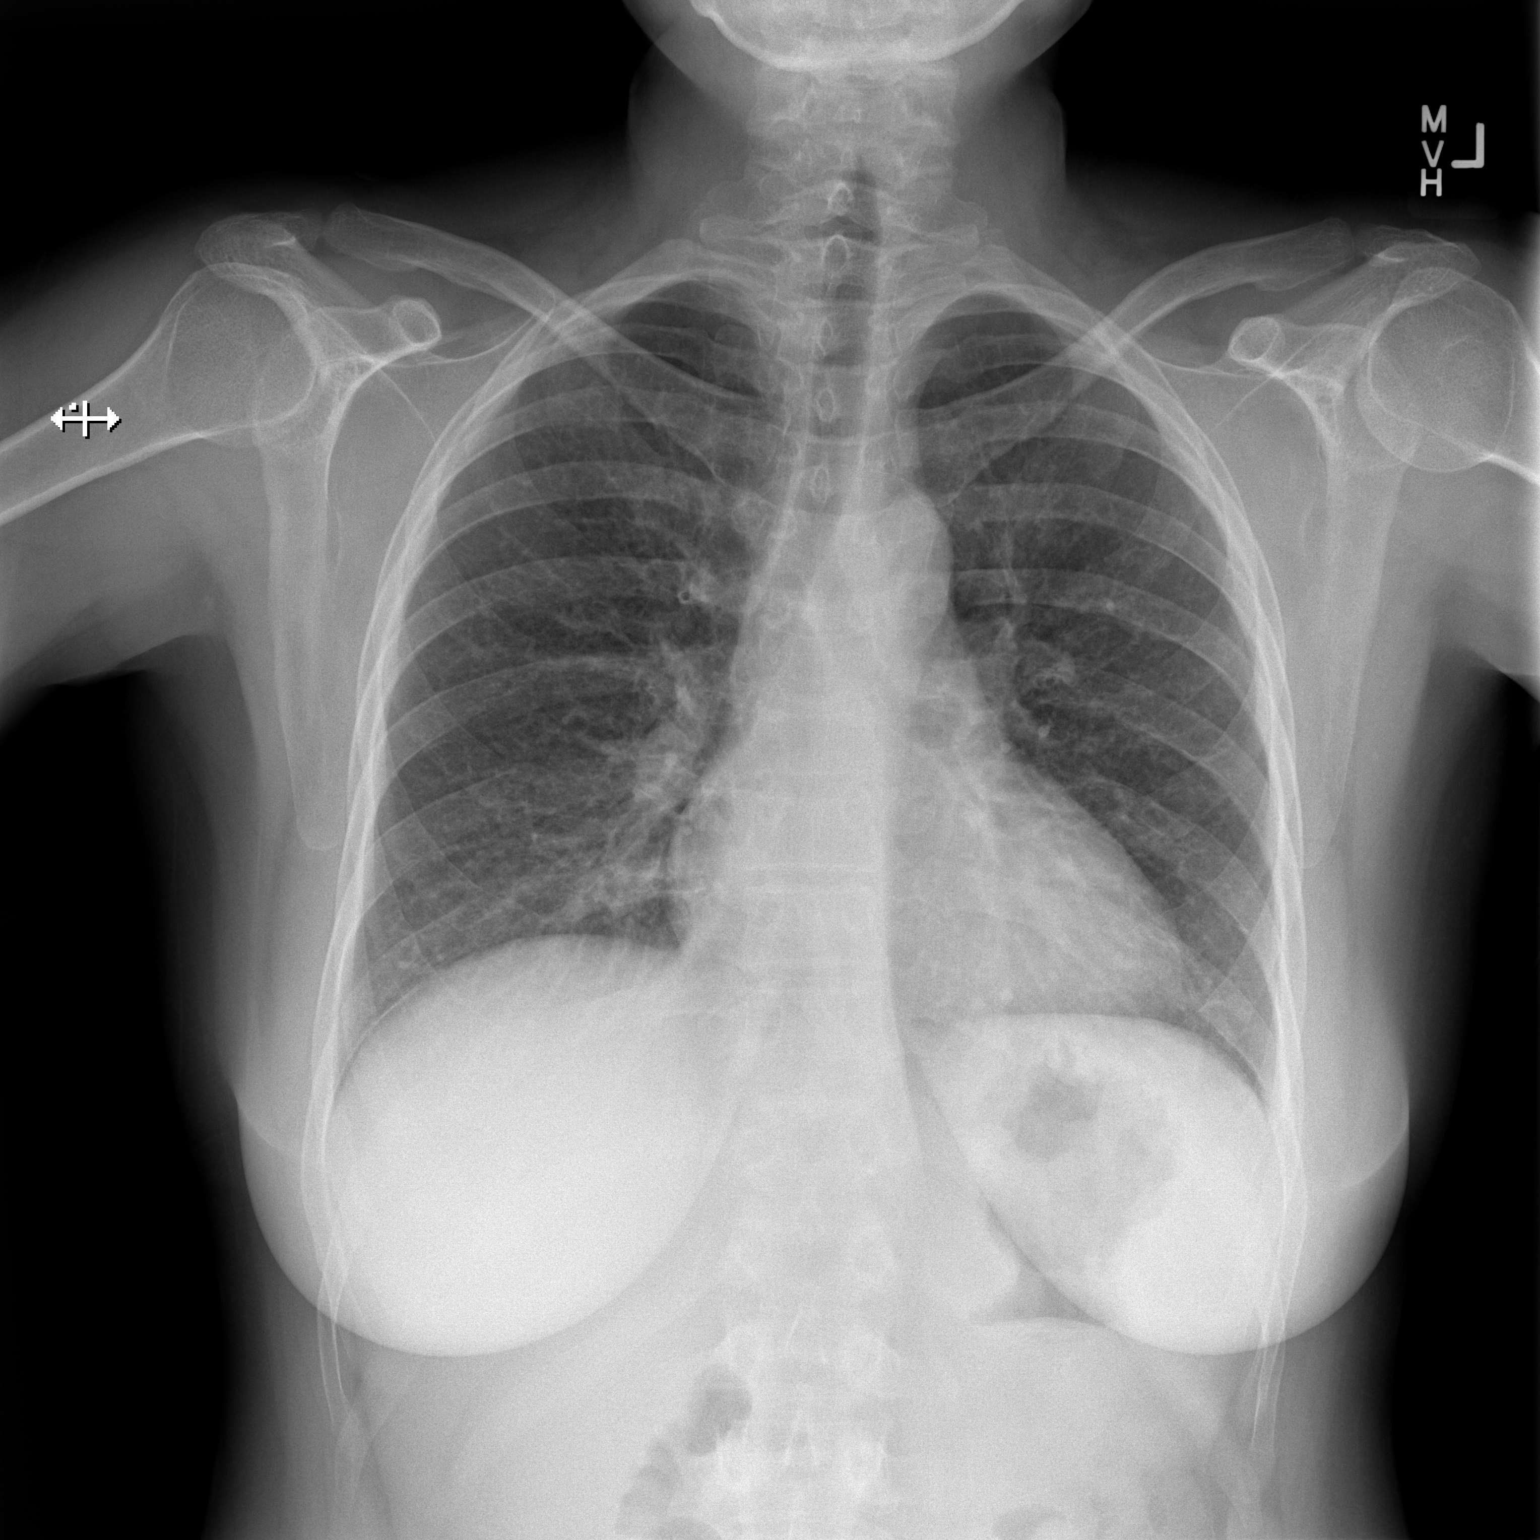

[w chest lat]
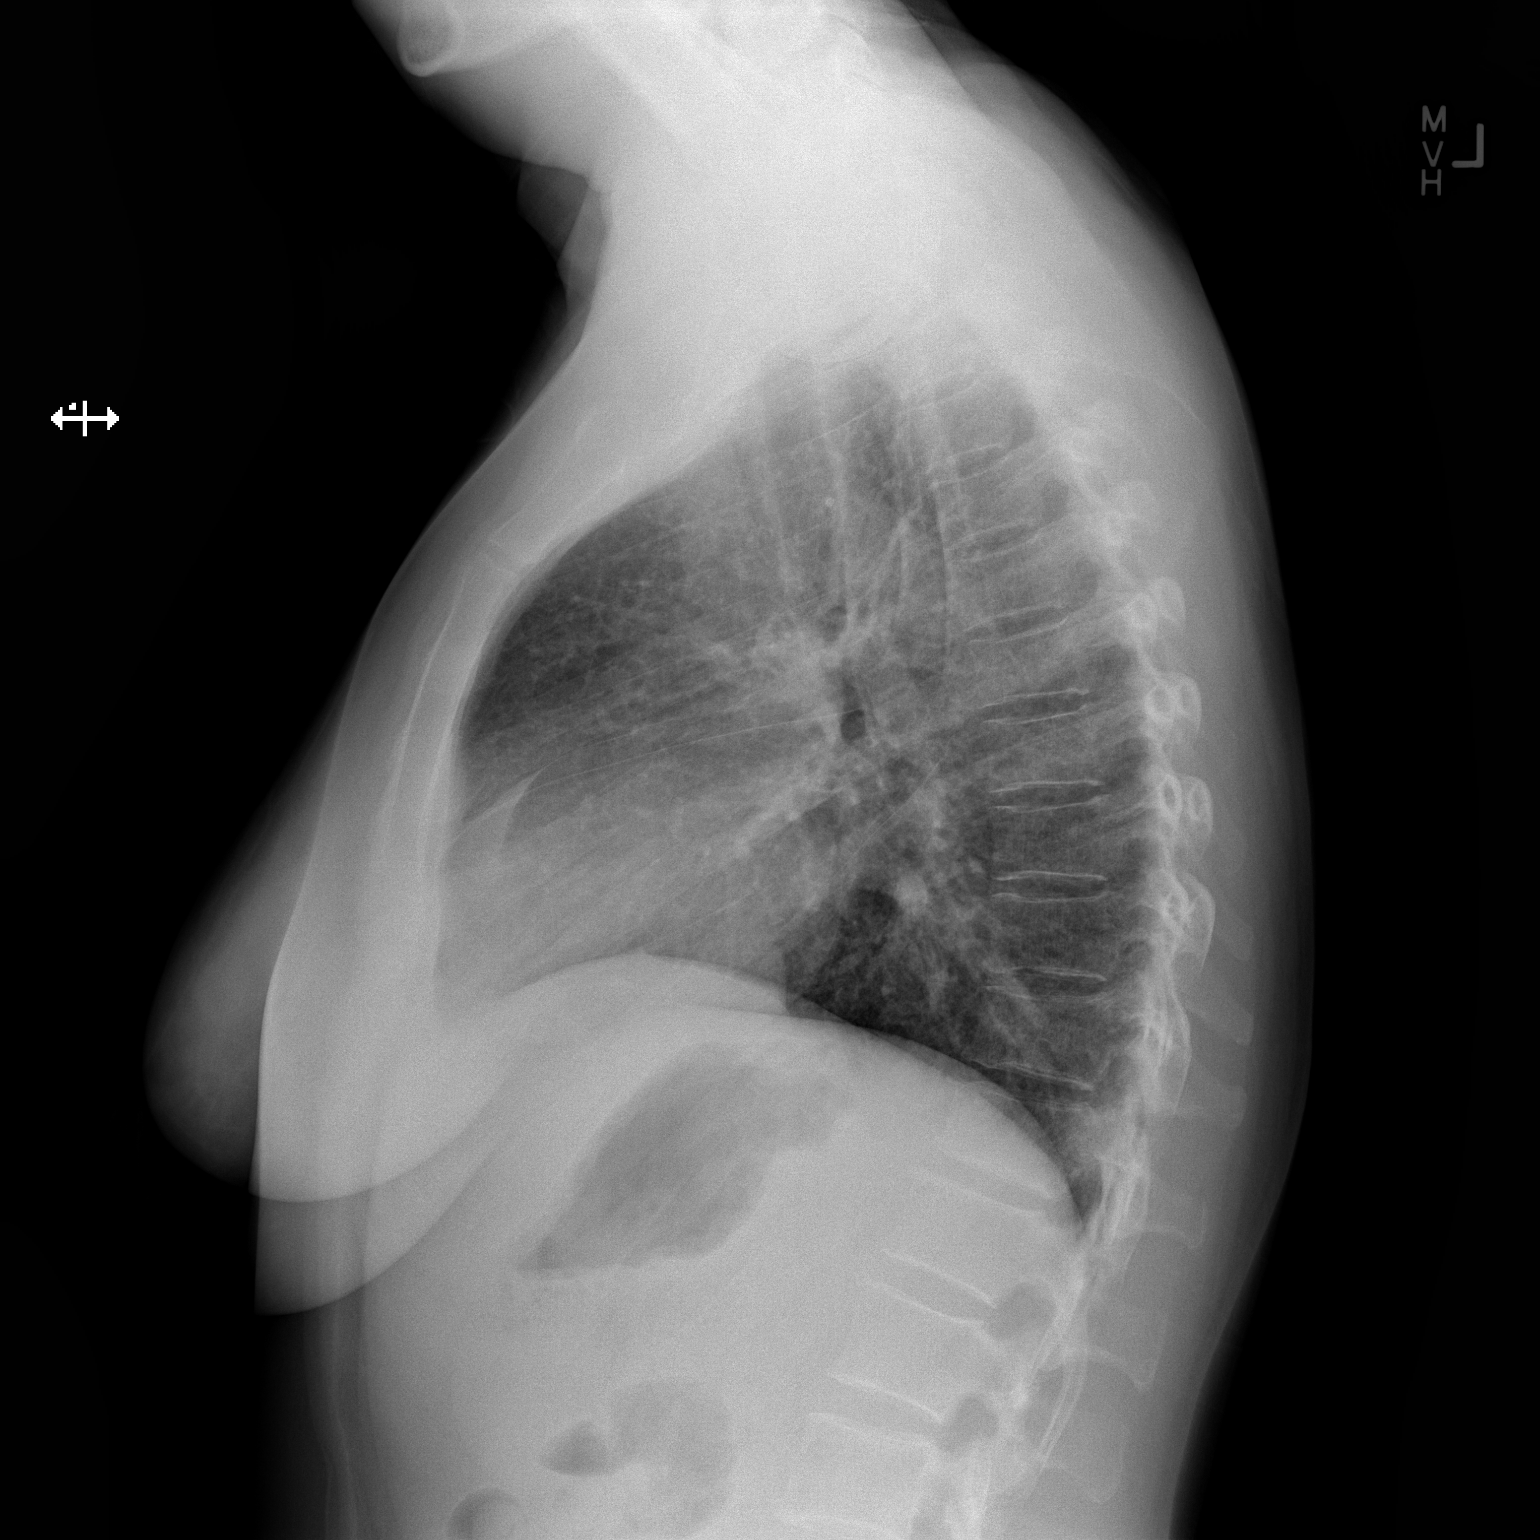

[2 of 2 positions shown; findings below may reference images not displayed]

FINDINGS: The heart size and mediastinal contours are within normal limits.
Mild central peribronchial thickening again noted. No evidence of
pulmonary infiltrate or edema. No evidence pleural effusion. No mass
or lymphadenopathy identified. The visualized skeletal structures
are unremarkable.
IMPRESSION: No active cardiopulmonary disease.

## 2013-06-25 MED ORDER — HYDROXYZINE HCL 25 MG PO TABS
25.0000 mg | ORAL_TABLET | Freq: Four times a day (QID) | ORAL | Status: DC | PRN
Start: 2013-06-25 — End: 2013-06-25

## 2013-06-25 MED ORDER — ACETAMINOPHEN 325 MG PO TABS
650.0000 mg | ORAL_TABLET | ORAL | Status: DC | PRN
Start: 2013-06-25 — End: 2013-06-26

## 2013-06-25 MED ORDER — ALBUTEROL SULFATE HFA 108 (90 BASE) MCG/ACT IN AERS: 2.0000 | INHALATION_SPRAY | Freq: Four times a day (QID) | RESPIRATORY_TRACT | Status: DC | PRN

## 2013-06-25 MED ORDER — ZOLPIDEM TARTRATE 5 MG PO TABS: 5.0000 mg | ORAL_TABLET | Freq: Every evening | ORAL | Status: DC | PRN

## 2013-06-25 MED ORDER — DICYCLOMINE HCL 20 MG PO TABS: 20.0000 mg | ORAL_TABLET | Freq: Four times a day (QID) | ORAL | Status: DC | PRN

## 2013-06-25 MED ORDER — NAPROXEN 500 MG PO TABS: 500.0000 mg | ORAL_TABLET | Freq: Two times a day (BID) | ORAL | Status: DC | PRN

## 2013-06-25 MED ORDER — ALUM & MAG HYDROXIDE-SIMETH 200-200-20 MG/5ML PO SUSP: 30.0000 mL | ORAL | Status: DC | PRN

## 2013-06-25 MED ORDER — CLONIDINE HCL 0.1 MG PO TABS: 0.1000 mg | ORAL_TABLET | Freq: Four times a day (QID) | ORAL | Status: DC

## 2013-06-25 MED ORDER — CLONIDINE HCL 0.1 MG PO TABS: 0.1000 mg | ORAL_TABLET | ORAL | Status: DC

## 2013-06-25 MED ORDER — LOPERAMIDE HCL 2 MG PO CAPS: 2.0000 mg | ORAL_CAPSULE | ORAL | Status: DC | PRN

## 2013-06-25 MED ORDER — IPRATROPIUM BROMIDE 0.02 % IN SOLN
0.5000 mg | Freq: Once | RESPIRATORY_TRACT | Status: AC
  Administered 2013-06-25: 0.5 mg via RESPIRATORY_TRACT
  Filled 2013-06-25: qty 2.5

## 2013-06-25 MED ORDER — METHOCARBAMOL 500 MG PO TABS: 500.0000 mg | ORAL_TABLET | Freq: Three times a day (TID) | ORAL | Status: DC | PRN

## 2013-06-25 MED ORDER — TRAZODONE HCL 50 MG PO TABS: 50.0000 mg | ORAL_TABLET | Freq: Every evening | ORAL | Status: DC | PRN

## 2013-06-25 MED ORDER — ALBUTEROL SULFATE (2.5 MG/3ML) 0.083% IN NEBU
5.0000 mg | INHALATION_SOLUTION | Freq: Once | RESPIRATORY_TRACT | Status: AC
  Administered 2013-06-25: 5 mg via RESPIRATORY_TRACT
  Filled 2013-06-25: qty 6

## 2013-06-25 MED ORDER — LORAZEPAM 1 MG PO TABS
1.0000 mg | ORAL_TABLET | Freq: Three times a day (TID) | ORAL | Status: DC | PRN
  Administered 2013-06-25: 1 mg via ORAL
  Filled 2013-06-25: qty 1

## 2013-06-25 MED ORDER — HYDROXYZINE HCL 25 MG PO TABS: 50.0000 mg | ORAL_TABLET | Freq: Two times a day (BID) | ORAL | Status: DC

## 2013-06-25 MED ORDER — CLONIDINE HCL 0.1 MG PO TABS: 0.1000 mg | ORAL_TABLET | Freq: Every day | ORAL | Status: DC

## 2013-06-25 MED ORDER — ONDANSETRON HCL 4 MG PO TABS
4.0000 mg | ORAL_TABLET | Freq: Three times a day (TID) | ORAL | Status: DC | PRN
  Administered 2013-06-25: 4 mg via ORAL
  Filled 2013-06-25: qty 1

## 2013-06-25 MED ORDER — QUETIAPINE FUMARATE ER 300 MG PO TB24
300.0000 mg | ORAL_TABLET | Freq: Every day | ORAL | Status: DC
  Administered 2013-06-25: 300 mg via ORAL
  Filled 2013-06-25 (×2): qty 1

## 2013-06-25 MED ORDER — SERTRALINE HCL 50 MG PO TABS
100.0000 mg | ORAL_TABLET | Freq: Every day | ORAL | Status: DC
  Filled 2013-06-25: qty 2

## 2013-06-25 MED ORDER — IBUPROFEN 200 MG PO TABS
600.0000 mg | ORAL_TABLET | Freq: Three times a day (TID) | ORAL | Status: DC | PRN
  Administered 2013-06-25: 600 mg via ORAL
  Filled 2013-06-25: qty 3

## 2013-06-25 MED ORDER — ONDANSETRON 4 MG PO TBDP: 4.0000 mg | ORAL_TABLET | Freq: Four times a day (QID) | ORAL | Status: DC | PRN

## 2013-06-25 NOTE — ED Notes (Signed)
Pt sts she is here for detox to heroin and opiates. Pt last used heroin last night. Pt sts that she has been using everyday x2. Denies any SI/HI. Pt is cooperative. Pt sts she has not been seen in [facility] for detox. Pt was seen at [facility] for methadone clinic. Pt is not on any methadone at this time.

## 2013-06-25 NOTE — BH Assessment (Signed)
[place] Assessment Progress Note  At 20:29 I spoke to [place], [place] in anticipation of TTS assessment.  Jalene Mullet, MA Triage Specialist 06/25/2013 @ 20:33

## 2013-06-25 NOTE — ED Provider Notes (Signed)
CSN: [id number]     Arrival date & time 06/25/13  1201 History   First MD Initiated Contact with Patient 06/25/13 1323     Chief Complaint  Patient presents with  . Medical Clearance     (Consider location/radiation/quality/duration/timing/severity/associated sxs/prior Treatment) HPI: [name] is a 48 year old woman with past medical history of substance abuse and asthma who presents to the Emergency Department desiring help to detox from heroin.  She reports that she uses $60-100 worth of heroin every day; she last used last night.  She also describes abuse of other substances including crack cocaine, alcohol, and her prescribed vistaril.  She used all three substances last night.  She is on a waiting list for a methadone clinic and would like help in detoxing before she goes there.  She states that she has never gone through detox before but has been treated with methadone in the past.  Right now, she is experiencing body aches, sweats, and nausea.  She also describes a one week history of wheezing, chest pressure similar to past asthma exacerbations, productive cough, rhinorrhea, headache, and face pain.  She denies fevers.  She has been out of her albuterol inhaler.      Past Medical History  Diagnosis Date  . Anxiety   . Depression   . Substance abuse Clean as of 2009    Crack cocaine  . GERD (gastroesophageal reflux disease)   . Heart murmur   . Back pain   . Endometriosis   . 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 Cesarean section    . Tubal ligation  2000  . Foot surgery     Family History  Problem Relation Age of Onset  . Hypertension Father   . Diabetes Maternal Grandmother    History  Substance Use Topics  . Smoking status: Current Every Day Smoker -- 1.00 packs/day    Types: Cigarettes  . Smokeless tobacco: Never Used  . Alcohol Use: No   OB History   Grav Para Term Preterm Abortions TAB SAB Ect Mult  Living   3 3 3       3      Review of Systems  All other systems negative except as documented in the HPI. All pertinent positives and negatives as reviewed in the HPI.  Allergies  Amitriptyline hcl  Home Medications   Current Outpatient Rx  Name  Route  Sig  Dispense  Refill  . albuterol (PROVENTIL HFA;VENTOLIN HFA) 108 (90 BASE) MCG/ACT inhaler   Inhalation   Inhale 2 puffs into the lungs every 6 (six) hours as needed for wheezing.         . hydrOXYzine (ATARAX/VISTARIL) 50 MG tablet   Oral   Take 50 mg by mouth 2 (two) times daily.         . QUEtiapine (SEROQUEL XR) 300 MG 24 hr tablet   Oral   Take 300 mg by mouth at bedtime.         . sertraline (ZOLOFT) 100 MG tablet   Oral   Take 100 mg by mouth daily.          BP 123/92  Pulse 81  Temp(Src) 97.7 F (36.5 C) (Oral)  Resp 16  SpO2 97%  LMP 05/30/2013 Physical Exam  Nursing note and vitals reviewed. Constitutional: She is oriented to person, place, and time. She appears well-developed and well-nourished. No distress.  HENT:  Head: Normocephalic and  atraumatic.  Nose: Nose normal.  Mouth/Throat: Oropharynx is clear and moist. No oropharyngeal exudate.  Eyes: Pupils are equal, round, and reactive to light.  Neck: Normal range of motion. Neck supple.  Cardiovascular: Normal rate, regular rhythm and normal heart sounds.   Pulmonary/Chest: Effort normal. No respiratory distress. She has wheezes. She has no rales.  Diffuse expiratory wheezes; deep breaths elicit bouts of coughing   Lymphadenopathy:    She has cervical adenopathy.  Neurological: She is alert and oriented to person, place, and time.  Skin: Skin is warm and dry.  Psychiatric: She has a normal mood and affect. Her behavior is normal. Judgment and thought content normal.  Appears anxious and fidgety, shaking slightly    ED Course  Procedures (including critical care time) Labs Review Labs Reviewed  BASIC METABOLIC PANEL - Abnormal;  Notable for the following:    Glucose, Bld 109 (*)    BUN 4 (*)    All other components within normal limits  URINE RAPID DRUG SCREEN (HOSP PERFORMED) - Abnormal; Notable for the following:    Opiates POSITIVE (*)    Cocaine POSITIVE (*)    All other components within normal limits  CBC  ETHANOL   Imaging Review Dg Chest 2 View  06/25/2013   CLINICAL DATA:  Cough.  Asthma.  Smoker.  EXAM: CHEST  2 VIEW  COMPARISON:  None.  FINDINGS: The heart size and mediastinal contours are within normal limits. Mild central peribronchial thickening again noted. No evidence of pulmonary infiltrate or edema. No evidence pleural effusion. No mass or lymphadenopathy identified. The visualized skeletal structures are unremarkable.  IMPRESSION: No active cardiopulmonary disease.   Electronically Signed   By: Earle Gell M.D.   On: 06/25/2013 17:42   Patient will be evaluated by behavioral health team for further recommendations for treatment.      Brent General, PA-C 06/26/13 0003
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
## 2013-06-25 NOTE — ED Notes (Signed)
Respiratory called, on their way to administer breathing treatment.

## 2013-06-25 NOTE — ED Notes (Signed)
Patient denies SI, HI, AVH at present. States she is "aching all over". Appears anxious. States she is unable to say how she is feeling. States she would not have come in "if I knew it would be like this". Encouragement offered. Snack given. Environment adjusted. Patient resting quietly in bed.   Safety maintained, Q 15 checks in place.

## 2013-06-25 NOTE — Progress Notes (Signed)
[facility] provided pt with a [employer], highlighting [facility], to help patient establish primary care. Patient had heard about [facility] through PCP [facility].

## 2013-06-25 NOTE — Progress Notes (Signed)
Writer Faxed referral to the following facilities for placement:  [facility]

## 2013-06-26 NOTE — ED Provider Notes (Signed)
Medical screening examination/treatment/procedure(s) were performed by non-physician practitioner and as supervising physician I was immediately available for consultation/collaboration.   EKG Interpretation None        Blanchard Kelch, MD 06/26/13 1201

## 2013-06-26 NOTE — Progress Notes (Signed)
[facility]: 0115 Spoke with Janeann Forehand, Pt has been accepted but need to have pre-screen filled out prior to pt arriving. Pre-screen was faxed and RN would be notified to be informed of disposition.  Spoke with Jinny Blossom RN, informed that pt has been accepted by Dr. Rosine Door Number for report [phone]. Pt can be transported by [facility].  Mady Gemma, MHT

## 2013-06-26 NOTE — ED Notes (Signed)
Patient reports no pain at present. "Feeling sleepy". Patient drowsy, skin warm and dry. Respirations even unlabored.  Safety maintained, Q 15 checks continue.

## 2013-06-26 NOTE — Discharge Instructions (Signed)
Dont use drugs. Dont come to the ER for detox from heroine. Emergency Rooms are not detox centers. Please follow the recommendations given to you by the detox counsellors and the follow-up and medications they have prescribed to you. Please refrain from substance abuse. Good luck   Chemical Dependency Chemical dependency is an addiction to drugs or alcohol. It is characterized by the repeated behavior of seeking out and using drugs and alcohol despite harmful consequences to the health and safety of ones self and others.  RISK FACTORS There are certain situations or behaviors that increase a person's risk for chemical dependency. These include:  A family history of chemical dependency.  A history of mental health issues, including depression and anxiety.  A home environment where drugs and alcohol are easily available to you.  Drug or alcohol use at a young age. SYMPTOMS  The following symptoms can indicate chemical dependency:  Inability to limit the use of drugs or alcohol.  Nausea, sweating, shakiness, and anxiety that occurs when alcohol or drugs are not being used.  An increase in amount of drugs or alcohol that is necessary to get drunk or high. People who experience these symptoms can assess their use of drugs and alcohol by asking themselves the following questions:  Have you been told by friends or family that they are worried about your use of alcohol or drugs?  Do friends and family ever tell you about things you did while drinking alcohol or using drugs that you do not remember?  Do you lie about using alcohol or drugs or about the amounts you use?  Do you have difficulty completing daily tasks unless you use alcohol or drugs?  Is the level of your work or school performance lower because of your drug or alcohol use?  Do you get sick from using drugs or alcohol but keep using anyway?  Do you feel uncomfortable in social situations unless you use alcohol or  drugs?  Do you use drugs or alcohol to help forget problems? An answer of yes to any of these questions may indicate chemical dependency. Professional evaluation is suggested. Document Released: 03/13/2001 Document Revised: 06/11/2011 Document Reviewed: 05/25/2010 [facility] Patient Information 2014 [place], [place].

## 2014-02-01 ENCOUNTER — Encounter (HOSPITAL_COMMUNITY): Payer: Self-pay | Admitting: Emergency Medicine

## 2014-05-16 ENCOUNTER — Encounter (HOSPITAL_COMMUNITY): Payer: Self-pay | Admitting: Emergency Medicine

## 2014-05-16 ENCOUNTER — Emergency Department (HOSPITAL_COMMUNITY)
Admission: EM | Admit: 2014-05-16 | Discharge: 2014-05-16 | Disposition: A | Discharge status: Home / Self Care | Payer: Self-pay | Attending: Emergency Medicine | Admitting: Emergency Medicine

## 2014-05-16 DIAGNOSIS — Z79899 Other long term (current) drug therapy: Secondary | ICD-10-CM | POA: Insufficient documentation

## 2014-05-16 DIAGNOSIS — J45909 Unspecified asthma, uncomplicated: Secondary | ICD-10-CM | POA: Insufficient documentation

## 2014-05-16 DIAGNOSIS — F419 Anxiety disorder, unspecified: Secondary | ICD-10-CM | POA: Insufficient documentation

## 2014-05-16 DIAGNOSIS — Z8742 Personal history of other diseases of the female genital tract: Secondary | ICD-10-CM | POA: Insufficient documentation

## 2014-05-16 DIAGNOSIS — F329 Major depressive disorder, single episode, unspecified: Secondary | ICD-10-CM | POA: Insufficient documentation

## 2014-05-16 DIAGNOSIS — Z720 Tobacco use: Secondary | ICD-10-CM | POA: Insufficient documentation

## 2014-05-16 DIAGNOSIS — K088 Other specified disorders of teeth and supporting structures: Secondary | ICD-10-CM | POA: Insufficient documentation

## 2014-05-16 DIAGNOSIS — K0889 Other specified disorders of teeth and supporting structures: Secondary | ICD-10-CM

## 2014-05-16 DIAGNOSIS — Z8719 Personal history of other diseases of the digestive system: Secondary | ICD-10-CM | POA: Insufficient documentation

## 2014-05-16 DIAGNOSIS — R011 Cardiac murmur, unspecified: Secondary | ICD-10-CM | POA: Insufficient documentation

## 2014-05-16 MED ORDER — IBUPROFEN 800 MG PO TABS: 800.0000 mg | ORAL_TABLET | Freq: Three times a day (TID) | ORAL | Status: DC

## 2014-05-16 MED ORDER — AMOXICILLIN 500 MG PO CAPS: 500.0000 mg | ORAL_CAPSULE | Freq: Three times a day (TID) | ORAL | Status: DC

## 2014-05-16 NOTE — Discharge Instructions (Signed)
Dental Abscess A dental abscess is a collection of infected fluid (pus) from a bacterial infection in the inner part of the tooth (pulp). It usually occurs at the end of the tooth's root.  CAUSES   Severe tooth decay.  Trauma to the tooth that allows bacteria to enter into the pulp, such as a broken or chipped tooth. SYMPTOMS   Severe pain in and around the infected tooth.  Swelling and redness around the abscessed tooth or in the mouth or face.  Tenderness.  Pus drainage.  Bad breath.  Bitter taste in the mouth.  Difficulty swallowing.  Difficulty opening the mouth.  Nausea.  Vomiting.  Chills.  Swollen neck glands. DIAGNOSIS   A medical and dental history will be taken.  An examination will be performed by tapping on the abscessed tooth.  X-rays may be taken of the tooth to identify the abscess. TREATMENT The goal of treatment is to eliminate the infection. You may be prescribed antibiotic medicine to stop the infection from spreading. A root canal may be performed to save the tooth. If the tooth cannot be saved, it may be pulled (extracted) and the abscess may be drained.  HOME CARE INSTRUCTIONS  Only take over-the-counter or prescription medicines for pain, fever, or discomfort as directed by your caregiver.  Rinse your mouth (gargle) often with salt water ( tsp salt in 8 oz [250 ml] of warm water) to relieve pain or swelling.  Do not drive after taking pain medicine (narcotics).  Do not apply heat to the outside of your face.  Return to your dentist for further treatment as directed. SEEK MEDICAL CARE IF:  Your pain is not helped by medicine.  Your pain is getting worse instead of better. SEEK IMMEDIATE MEDICAL CARE IF:  You have a fever or persistent symptoms for more than 2-3 days.  You have a fever and your symptoms suddenly get worse.  You have chills or a very bad headache.  You have problems breathing or swallowing.  You have trouble  opening your mouth.  You have swelling in the neck or around the eye. Document Released: 03/19/2005 Document Revised: 12/12/2011 Document Reviewed: 06/27/2010 [facility] Patient Information 2015 [place], [place]. This information is not intended to replace advice given to you by your health care provider. Make sure you discuss any questions you have with your health care provider.  Emergency Department Resource Guide 1) Find a Doctor and Pay Out of Pocket Although you won't have to find out who is covered by your insurance plan, it is a good idea to ask around and get recommendations. You will then need to call the office and see if the doctor you have chosen will accept you as a new patient and what types of options they offer for patients who are self-pay. Some doctors offer discounts or will set up payment plans for their patients who do not have insurance, but you will need to ask so you aren't surprised when you get to your appointment.  2) Contact Your Local Health Department Not all health departments have doctors that can see patients for sick visits, but many do, so it is worth a call to see if yours does. If you don't know where your local health department is, you can check in your phone book. The CDC also has a tool to help you locate your state's health department, and many state websites also have listings of all of their local health departments.  3) Find a [employer]  If your illness is not likely to be very severe or complicated, you may want to try a walk in clinic. These are popping up all over the country in pharmacies, drugstores, and shopping centers. They're usually staffed by nurse practitioners or physician assistants that have been trained to treat common illnesses and complaints. They're usually fairly quick and inexpensive. However, if you have serious medical issues or chronic medical problems, these are probably not your best option.  No Primary Care Doctor: - Call  Health Connect at  [phone] - they can help you locate a primary care doctor that  accepts your insurance, provides certain services, etc. - Physician Referral Service- [phone]  Chronic Pain Problems: Organization         Address  Phone   Notes  [facility]  [phone] Patients need to be referred by their primary care doctor.   Medication Assistance: Organization         Address  Phone   Notes  [facility] Medication [facility] [address] [place], [place] [address] [phone] --Must be a resident of [facility] -- Must have NO insurance coverage whatsoever (no Medicaid/ Medicare, etc.) -- The pt. MUST have a primary care doctor that directs their care regularly and follows them in the community   MedAssist  [phone]   [employer]  [phone]    Agencies that provide inexpensive medical care: Organization         Address  Phone   Notes  [place]  [phone]   [facility]    [phone]   [facility] [place], [place] [address] [phone]   [place] [address] [place] [phone]   Planned Parenthood    [phone]   [facility]    [phone]   [place] and [place] [address], [place] Phone:  [phone], Fax:  [phone] Hours of Operation:  9 am - 6 pm, M-F.  Also accepts Medicaid/Medicare and self-pay.  [facility] for [place] [address], [address], [place] Phone: [phone], Fax: [phone]. Hours of Operation:  8:30 am - 5:30 pm, M-F.  Also accepts Medicaid and self-pay.  [facility] [place] [address] [place] Phone: [phone]   [place], [place], [place] [phone], Ext. [phone] Mondays & Thursdays: 7-9 AM.  First 15 patients are seen on a first come, first serve  basis.    [place] Providers:  Organization         Address  Phone   Notes  [facility] [address], [place] [phone] Also accepts self-pay patients.  [facility] [address], [place]  [phone]   [place], [address], [place] [phone]   [facility] [address] [place] [phone]   Lucianne Lei [address], [place]   [phone] Only accepts [place] Access [place] patients after they have their name applied to their card.   Self-Pay (no insurance) in [facility]:  Organization         Address  Phone   Notes  Sickle Cell Patients, [facility] [place] [phone])  [facility] [place] [phone]   [facility] [place]  [place], [address],  [phone]   [facility]/Dr. Osei-Bonsu  [address] [place] or [address], [place] [phone] Phone number for both [place] and [place] locations is the same.  Urgent Medical and [facility] [address] [place] [phone]   [facility] [address], [place] or [address] [phone]   [facility] [address], [place] [phone], phone; [phone], fax Sees patients 1st and 3rd Saturday of every month.  Must not qualify for public or private insurance (i.e. Medicaid, Medicare, [place] Health Choice, Veterans' Benefits)  Household income should be no more than 200% of the poverty level The clinic cannot treat you if you are pregnant or think you are pregnant  Sexually transmitted diseases are not treated at the clinic.    Dental Care: Organization         Address  Phone  Notes  [facility] Department of [facility] [place] [phone] Accepts children up to age 73 who are enrolled in [place] or [place]; pregnant women with a Medicaid card; and children who have applied for Medicaid or [place] Health Choice, but were declined, whose parents can pay a reduced fee at time of service.  [facility] Department of [facility]  [address], [place] [phone] Accepts children up to age 31 who are enrolled in [place] or [place]; pregnant women with a Medicaid card; and children who have applied for Medicaid or [place] Health Choice, but were declined, whose parents can pay a reduced fee at time of service.  [facility] Adult [facility] Access PROGRAM  [place] [phone] Patients are seen by appointment only. Walk-ins are not accepted. [place] will see patients 64 years of age and older. Monday - Tuesday (8am-5pm) Most Wednesdays (8:30-5pm) $30 per visit, cash only  [facility] Adult Dental Access PROGRAM  [address], [facility] [phone] Patients are seen by appointment only. Walk-ins are not accepted. [place] will see patients 71 years of age and older. One Wednesday Evening (Monthly: Volunteer Based).  $30 per visit, cash only  [place]  [phone] for adults; Children under age 19, call Graduate Pediatric Dentistry at [phone]. Children aged 47-14, please call [phone] to request a pediatric application.  Dental services are provided in all areas of dental care including fillings, crowns and bridges, complete and partial dentures, implants, gum treatment, root canals, and extractions. Preventive care is also provided. Treatment is provided to both adults and children. Patients are selected via a lottery and there is often a waiting list.   [facility] [address] [place]  [phone]  [email or web address]   [facility] [address] [place], [place] [phone], Ext. [phone] Second and Fourth Thursday of each month, opens at 6:30 AM; Clinic ends at 9 AM.  Patients are seen on a first-come first-served basis, and a limited number are seen during each clinic.   [facility]  [address] Hillard Danker [place], [place] [phone]   Eligibility Requirements You must have lived in [place], [place], or [place] counties for  at least the last three months.   You cannot be eligible for state or federal sponsored [employer], including [employer], [place], or [employer].   You generally cannot be eligible for healthcare insurance through your employer.    How to apply: Eligibility screenings are held every Tuesday and Wednesday afternoon from 1:00 pm until 4:00 pm. You do not need an appointment for the interview!  [facility] [address], [place], [place]   [place]  [place] Department  [place]  [phone]    Behavioral Health Resources in the Community: Intensive Outpatient Programs Organization         Address  Phone  Notes  [place]. [address] [place], [place] [phone]   [facility] Outpatient [address] [place], [place]   ADS: Alcohol & Drug S[facility]cs [address] [place], [place]   [place] [address],  [place], [place] or [phone]   Substance Abuse Resources Organization         Address  Phone  Notes  Alcohol and Drug Services  [phone]   [place]  [phone]   [facility] [place]   Chinita Pester  [phone]   Residential & Outpatient Substance Abuse Program  [phone]   Psychological Services Organization          Address  Phone  Notes  [facility] [place]  [place]  [phone]   [place] [address], [place] or [phone]    Mobile Crisis Teams Organization         Address  Phone  Notes  [facility], Mobile Crisis Care Unit  [phone]   [facility]  [address] [place], [place]   [place] [address], [place] [phone]    Self-Help/Support Groups Organization         Address  Phone             Notes  [place]. of [place] - variety of support groups  [place] Call for more information  Narcotics Anonymous (NA), [facility] [address], [employer] [place]  2 meetings at this location   Special educational needs teacher         Address  Phone  Notes  [facility] [place],    [place]  [phone]   [facility]  [address] [place] [address], [place], [place]   [place], [place] [phone] Admissions: 8am-3pm M-F  Incentives Substance [facility] [address]    [place], [place] [phone]   [facility] [address] [place], [place], [place]   [facility] [address],  [place], [place]   [facility] Programs - Intensive Outpatient [address], [place], [place]   [facility] ([place].) [place].,  [place], [place] [phone] or [phone]   [facility]idential Treatment Services (RTS) [address], [place], [place] Accepts Medicaid  [facility] [place] [address],  [place] [phone] Substance Abuse/Addiction Treatment   [facility] Organization         Address  Phone  Notes  [facility]  [phone]  Domenic Schwab, PhD [address] Arlis Porta [place], [place]   [phone] or [phone]   Adrian [place], [place] [phone]   [place] [address], [place], [place] [phone] Insurance/Medicaid/sponsorship through [facility] and Families [address] [place]                                    [place], [place] [phone] [place] [address], [place] [phone]    Dr. Adele Schilder  [phone]   [facility] Dept. 1) [address]  2) [place] 3)  [place] [address], [place] [phone]  [phone]   [place] [phone] or [phone] (After Hours)

## 2014-05-16 NOTE — ED Provider Notes (Signed)
CSN: [id number]     Arrival date & time 05/16/14  6270 History   First MD Initiated Contact with Patient 05/16/14 0725     Chief Complaint  Patient presents with  . Dental Problem     (Consider location/radiation/quality/duration/timing/severity/associated sxs/prior Treatment) Patient is a 49 y.o. female presenting with tooth pain. The history is provided by the patient. No language interpreter was used.  Dental Pain Location:  Upper Upper teeth location:  4/RU 2nd bicuspid Quality:  Constant Severity:  Moderate Duration:  2 days Progression:  Worsening Chronicity:  New Context: abscess   Relieved by:  Nothing Worsened by:  Nothing tried Ineffective treatments:  None tried   Past Medical History  Diagnosis Date  . Anxiety   . Depression   . Substance abuse Clean as of 2009    Crack cocaine  . GERD (gastroesophageal reflux disease)   . Heart murmur   . Back pain   . Endometriosis   . 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 Asthma    Past Surgical History  Procedure Laterality Date  . Cesarean section    . Tubal ligation  2000  . Foot surgery     Family History  Problem Relation Age of Onset  . Hypertension Father   . Diabetes Maternal Grandmother    History  Substance Use Topics  . Smoking status: Current Every Day Smoker -- 1.00 packs/day    Types: Cigarettes  . Smokeless tobacco: Former Systems developer  . Alcohol Use: Yes   OB History    Gravida Para Term Preterm AB TAB SAB Ectopic Multiple Living   3 3 3       3      Review of Systems  HENT: Positive for dental problem.   All other systems reviewed and are negative.     Allergies  Amitriptyline hcl  Home Medications   Prior to Admission medications   Medication Sig Start Date End Date Taking? Authorizing Provider  albuterol (PROVENTIL HFA;VENTOLIN HFA) 108 (90 BASE) MCG/ACT inhaler Inhale 2 puffs into the lungs every 6 (six) hours as needed for wheezing.    Historical Provider, MD  hydrOXYzine  (ATARAX/VISTARIL) 50 MG tablet Take 50 mg by mouth 2 (two) times daily.    Historical Provider, MD  QUEtiapine (SEROQUEL XR) 300 MG 24 hr tablet Take 300 mg by mouth at bedtime.    Historical Provider, MD  sertraline (ZOLOFT) 100 MG tablet Take 100 mg by mouth daily.    Historical Provider, MD   BP 122/71 mmHg  Pulse 77  Temp(Src) 97.7 F (36.5 C) (Oral)  Resp 20  SpO2 100%  LMP 07/14/2013 Physical Exam  Constitutional: She is oriented to person, place, and time. She appears well-developed and well-nourished.  HENT:  Head: Normocephalic and atraumatic.  Broken decayed tooth right upper  Eyes: Conjunctivae and EOM are normal. Pupils are equal, round, and reactive to light.  Neck: Normal range of motion.  Cardiovascular: Normal rate.   Pulmonary/Chest: Effort normal.  Abdominal: She exhibits no distension.  Musculoskeletal: Normal range of motion.  Neurological: She is alert and oriented to person, place, and time.  Psychiatric: She has a normal mood and affect.  Nursing note and vitals reviewed.   ED Course  Procedures (including critical care time) Labs Review Labs Reviewed - No data to display  Imaging Review No results found.   EKG Interpretation None      MDM   Final diagnoses:  Tooth ache    Meds ordered  this encounter  Medications  . amoxicillin (AMOXIL) 500 MG capsule    Sig: Take 1 capsule (500 mg total) by mouth 3 (three) times daily.    Dispense:  30 capsule    Refill:  0    Order Specific Question:  Supervising Provider    Answer:  Noemi Chapel D [[id number]]  . ibuprofen (ADVIL,MOTRIN) 800 MG tablet    Sig: Take 1 tablet (800 mg total) by mouth 3 (three) times daily.    Dispense:  21 tablet    Refill:  0    Order Specific Question:  Supervising Provider    Answer:  Noemi Chapel D [[id number]]      Hollace Kinnier Boardman, PA-C 05/16/14 [place], MD 05/16/14 [phone]

## 2014-05-16 NOTE — ED Notes (Signed)
Pt. Stated, MY UPPER RIGHT TOOTH HAS BEEN BOTHERING ME FOR 2 DAYS AND i THINK ITS INFECTED.

## 2014-05-18 ENCOUNTER — Telehealth: Payer: Self-pay | Admitting: Family Medicine

## 2014-05-18 NOTE — Telephone Encounter (Signed)
Pt called and needs a referral to the [facility]. She has the orange card. jw

## 2014-05-19 NOTE — Telephone Encounter (Signed)
Patient has made an appoitment

## 2014-05-19 NOTE — Telephone Encounter (Signed)
Pt has not been seen in clinic for 2+ years.  Needs appointment.  Tamela Oddi Awanda Mink, DO of [facility] 05/19/2014, 8:40 AM

## 2014-05-24 ENCOUNTER — Ambulatory Visit: Payer: Self-pay | Admitting: Family Medicine

## 2014-05-27 ENCOUNTER — Ambulatory Visit: Payer: Self-pay | Admitting: Family Medicine

## 2014-05-28 ENCOUNTER — Ambulatory Visit: Payer: Self-pay | Admitting: Family Medicine

## 2014-06-08 ENCOUNTER — Ambulatory Visit (INDEPENDENT_AMBULATORY_CARE_PROVIDER_SITE_OTHER): Payer: Self-pay | Admitting: Family Medicine

## 2014-06-08 ENCOUNTER — Encounter: Payer: Self-pay | Admitting: Family Medicine

## 2014-06-08 VITALS — BP 131/86 | HR 88 | Temp 98.3°F | Ht 63.0 in | Wt 127.6 lb

## 2014-06-08 DIAGNOSIS — K047 Periapical abscess without sinus: Secondary | ICD-10-CM

## 2014-06-08 DIAGNOSIS — K088 Other specified disorders of teeth and supporting structures: Secondary | ICD-10-CM

## 2014-06-08 DIAGNOSIS — S025XXA Fracture of tooth (traumatic), initial encounter for closed fracture: Secondary | ICD-10-CM

## 2014-06-08 DIAGNOSIS — K0889 Other specified disorders of teeth and supporting structures: Secondary | ICD-10-CM

## 2014-06-08 MED ORDER — AMOXICILLIN-POT CLAVULANATE 875-125 MG PO TABS: 1.0000 | ORAL_TABLET | Freq: Two times a day (BID) | ORAL | Status: DC

## 2014-06-08 NOTE — Patient Instructions (Signed)
Thank you for coming in, today!  You definitely need to see a dentist. I will put in a referral, today. You should take amoxicillin-clavulanate (Augmentin) twice per day for 2 weeks. You should take Tylenol for pain.  The dental clinic should contact you with an appointment. Come back to see your regular doctor, Dr. Awanda Mink, as needed.  Please feel free to call with any questions or concerns at any time, at [phone]. --Dr. Venetia Maxon

## 2014-06-08 NOTE — Progress Notes (Signed)
   Subjective:    Patient ID: Linda Cervantes, female    DOB: [birth year], 49 y.o.   MRN: [id number]  HPI: [facility] for 4-5 months of right upper and left lower tooth pain. Currently the right upper tooth is worse, worse for about 2 weeks. She has been to the emergency room and was given amoxicillin, which helped for about 2 weeks. She has not had fevers or frank N/V, but the pain is bad enough to sometimes cause a "sick" feeling. Her pain is constant, throbbing, aching, sometimes sharp, and sometimes radiating up behind her eye. She reports a "bad taste" in her mouth but no definite draining or bleeding. She has no difficulty breathing or handling her secretions. She does have significant pain with chewing.  She does not have a dentist. She was given a list of dentists by the ED and she called a couple of the numbers and was told she needs a referral. She has the orange card.  Review of Systems: As above.     Objective:   Physical Exam BP 131/86 mmHg  Pulse 88  Temp(Src) 98.3 F (36.8 C) (Oral)  Ht 5\' 3"  (1.6 m)  Wt 127 lb 9 oz (57.862 kg)  BMI 22.60 kg/m2  LMP 07/14/2013 Gen: well-appearing adult female in NAD HEENT: [place]/AT, EOMI, PERRLA, MMM, TM's clear bilaterally  Very poor dentition with at least two broken teeth (right upper and left lower molars)  Numerous caries otherwise  Gingivae around teeth red / irritated but without frank bleeding or drainage note  Gingival swelling / tenderness noted around right upper broken tooth in particular Cardio: RRR, no murmur appreciated Pulm: CTAB, no wheezes Ext: warm, well-perfused, no LE edema     Assessment & Plan:  49yo female with multiple broken / carious teeth and likely abscess causing significant acute on chronic dental pain - Rx for Augmentin and advised Tylenol for pain (pt with history of narcotic misuse) - referred urgently to dental clinic, today - f/u with PCP Dr. Awanda Mink PRN  Emmaline Kluver, MD PGY-3,  [place] 06/08/2014, 1:33 PM

## 2014-06-21 ENCOUNTER — Ambulatory Visit: Payer: Self-pay

## 2014-07-09 ENCOUNTER — Encounter (HOSPITAL_COMMUNITY): Payer: Self-pay | Admitting: Emergency Medicine

## 2014-07-09 ENCOUNTER — Emergency Department (HOSPITAL_COMMUNITY)
Admission: EM | Admit: 2014-07-09 | Discharge: 2014-07-09 | Disposition: A | Discharge status: Home / Self Care | Payer: Worker's Compensation | Attending: Emergency Medicine | Admitting: Emergency Medicine

## 2014-07-09 DIAGNOSIS — Y998 Other external cause status: Secondary | ICD-10-CM | POA: Diagnosis not present

## 2014-07-09 DIAGNOSIS — Z8742 Personal history of other diseases of the female genital tract: Secondary | ICD-10-CM | POA: Diagnosis not present

## 2014-07-09 DIAGNOSIS — S61412A Laceration without foreign body of left hand, initial encounter: Secondary | ICD-10-CM | POA: Insufficient documentation

## 2014-07-09 DIAGNOSIS — F419 Anxiety disorder, unspecified: Secondary | ICD-10-CM | POA: Diagnosis not present

## 2014-07-09 DIAGNOSIS — Z8719 Personal history of other diseases of the digestive system: Secondary | ICD-10-CM | POA: Insufficient documentation

## 2014-07-09 DIAGNOSIS — J45909 Unspecified asthma, uncomplicated: Secondary | ICD-10-CM | POA: Diagnosis not present

## 2014-07-09 DIAGNOSIS — Y9289 Other specified places as the place of occurrence of the external cause: Secondary | ICD-10-CM | POA: Diagnosis not present

## 2014-07-09 DIAGNOSIS — R011 Cardiac murmur, unspecified: Secondary | ICD-10-CM | POA: Insufficient documentation

## 2014-07-09 DIAGNOSIS — F329 Major depressive disorder, single episode, unspecified: Secondary | ICD-10-CM | POA: Diagnosis not present

## 2014-07-09 DIAGNOSIS — W260XXA Contact with knife, initial encounter: Secondary | ICD-10-CM | POA: Diagnosis not present

## 2014-07-09 DIAGNOSIS — Y9389 Activity, other specified: Secondary | ICD-10-CM | POA: Insufficient documentation

## 2014-07-09 DIAGNOSIS — Z79899 Other long term (current) drug therapy: Secondary | ICD-10-CM | POA: Diagnosis not present

## 2014-07-09 DIAGNOSIS — Z720 Tobacco use: Secondary | ICD-10-CM | POA: Diagnosis not present

## 2014-07-09 MED ORDER — CEPHALEXIN 500 MG PO CAPS: 500.0000 mg | ORAL_CAPSULE | Freq: Four times a day (QID) | ORAL | Status: DC

## 2014-07-09 MED ORDER — BACITRACIN ZINC 500 UNIT/GM EX OINT
TOPICAL_OINTMENT | CUTANEOUS | Status: AC
  Administered 2014-07-09: 1 via TOPICAL
  Filled 2014-07-09: qty 0.9

## 2014-07-09 MED ORDER — BACITRACIN 500 UNIT/GM EX OINT
1.0000 "application " | TOPICAL_OINTMENT | Freq: Once | CUTANEOUS | Status: AC
  Administered 2014-07-09: 1 via TOPICAL

## 2014-07-09 MED ORDER — LIDOCAINE HCL (PF) 1 % IJ SOLN
5.0000 mL | Freq: Once | INTRAMUSCULAR | Status: AC
  Administered 2014-07-09: 5 mL
  Filled 2014-07-09: qty 5

## 2014-07-09 MED ORDER — BACITRACIN 500 UNIT/GM EX OINT: 1.0000 "application " | TOPICAL_OINTMENT | Freq: Two times a day (BID) | CUTANEOUS | Status: DC

## 2014-07-09 NOTE — ED Provider Notes (Signed)
CSN: [id number]     Arrival date & time 07/09/14  1832 History  This chart was scribed for non-physician practitioner, Al Corpus, PA-C working with Charlesetta Shanks, MD by Tula Nakayama, ED scribe. This patient was seen in room WTR6/WTR6 and the patient's care was started at 7:32 PM    Chief Complaint  Patient presents with  . Extremity Laceration   The history is provided by the patient. No language interpreter was used.    HPI Comments: Linda Cervantes is a 49 y.o. female who presents to the Emergency Department complaining of a superficial laceration with controlled bleeding to the dorsal aspect of her left hand that occurred 1 hour ago. Pt reports that she was cutting a cabbage when the knife slipped and cut her hand. She has received a tetanus vaccination within the last 10 years. Pt denies a history of DM, cardiac problems and issues with her kidney or liver function. She also denies fever, chills, nausea, vomiting, numbness, tingling and weakness as associated symptoms.  Past Medical History  Diagnosis Date  . Anxiety   . Depression   . Substance abuse Clean as of 2009    Crack cocaine  . GERD (gastroesophageal reflux disease)   . Heart murmur   . Back pain   . Endometriosis   . 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 Asthma    Past Surgical History  Procedure Laterality Date  . Cesarean section    . Tubal ligation  2000  . Foot surgery     Family History  Problem Relation Age of Onset  . Hypertension Father   . Diabetes Maternal Grandmother    History  Substance Use Topics  . Smoking status: Current Every Day Smoker -- 1.00 packs/day    Types: Cigarettes  . Smokeless tobacco: Former Systems developer  . Alcohol Use: Yes   OB History    Gravida Para Term Preterm AB TAB SAB Ectopic Multiple Living   3 3 3       3      Review of Systems  Constitutional: Negative for fever and chills.  Gastrointestinal: Negative for nausea and vomiting.  Skin: Positive for wound.   Neurological: Negative for weakness and numbness.    Allergies  Amitriptyline hcl  Home Medications   Prior to Admission medications   Medication Sig Start Date End Date Taking? Authorizing Provider  albuterol (PROVENTIL HFA;VENTOLIN HFA) 108 (90 BASE) MCG/ACT inhaler Inhale 2 puffs into the lungs every 6 (six) hours as needed for wheezing (wheezing).    Yes Historical Provider, MD  hydrOXYzine (ATARAX/VISTARIL) 50 MG tablet Take 50 mg by mouth 2 (two) times daily.   Yes Historical Provider, MD  methadone (DOLOPHINE) 10 MG/5ML solution Take 130 mg by mouth daily.   Yes Historical Provider, MD  QUEtiapine (SEROQUEL XR) 300 MG 24 hr tablet Take 300 mg by mouth at bedtime.   Yes Historical Provider, MD  sertraline (ZOLOFT) 100 MG tablet Take 100 mg by mouth daily.   Yes Historical Provider, MD  amoxicillin-clavulanate (AUGMENTIN) 875-125 MG per tablet Take 1 tablet by mouth 2 (two) times daily. Patient not taking: Reported on 07/09/2014 06/08/14   Sharon Mt [address], MD  cephALEXin (KEFLEX) 500 MG capsule Take 1 capsule (500 mg total) by mouth 4 (four) times daily. 07/09/14   Al Corpus, PA-C  ibuprofen (ADVIL,MOTRIN) 800 MG tablet Take 1 tablet (800 mg total) by mouth 3 (three) times daily. Patient not taking: Reported on 07/09/2014 05/16/14   Hollace Kinnier  Sofia, PA-C   BP 140/86 mmHg  Pulse 65  Temp(Src) 98.3 F (36.8 C) (Oral)  Resp 20  SpO2 100% Physical Exam  Constitutional: She appears well-developed and well-nourished. No distress.  HENT:  Head: Normocephalic and atraumatic.  Eyes: Conjunctivae are normal. Right eye exhibits no discharge. Left eye exhibits no discharge.  Pulmonary/Chest: Effort normal. No respiratory distress.  Musculoskeletal: Normal range of motion.  2+ radial pulses equal bilaterally; strength and sensation intact  Neurological: She is alert. Coordination normal.  Skin: Skin is warm and dry. She is not diaphoretic.  1.5 superficial linear laceration to left  dorsal hand without gross contamination  Psychiatric: She has a normal mood and affect. Her behavior is normal.  Nursing note and vitals reviewed.   ED Course  Procedures   DIAGNOSTIC STUDIES: Oxygen Saturation is 98% on RA, normal by my interpretation.    COORDINATION OF CARE: 7:35 PM Discussed treatment plan with pt which includes wound care. Pt agreed to plan.  Labs Review Labs Reviewed - No data to display  Imaging Review No results found.   EKG Interpretation None      LACERATION REPAIR Performed by: Avari Gelles Authorized by: Al Corpus Consent: Verbal consent obtained. Risks and benefits: risks, benefits and alternatives were discussed Consent given by: patient Patient identity confirmed: provided demographic data Prepped and Draped in normal sterile fashion Wound explored  Laceration Location: left dorsal hand, radial side  Laceration Length: 1.5 cm  No Foreign Bodies seen or palpated  Anesthesia: local infiltration  Local anesthetic: lidocaine 1% w/o epinephrine  Anesthetic total: 4 ml  Irrigation method: syringe Amount of cleaning: standard  Skin closure: 5-0 Ethilon nylon  Number of sutures: 1  Technique: running  Patient tolerance: Patient tolerated the procedure well with no immediate complications.   MDM   Final diagnoses:  Hand laceration, left, initial encounter   Tdap up to date. Laceration occurred < 8 hours prior to repair which was well tolerated. VSS. Neurovascularly intact. Laceration anesthetized, irrigated and repaired without immediate complications. Bacitracin and sterile dressing applied. Patient with hand laceration with give prophylactic abx. Discussed suture home care w pt and answered questions. Pt to f-u for wound check and suture removal in 5-7 days. Pt is hemodynamically stable w no complaints prior to dc.    Discussed return precautions with patient. Discussed all results and patient verbalizes  understanding and agrees with plan.  I personally performed the services described in this documentation, which was scribed in my presence. The recorded information has been reviewed and is accurate.    Al Corpus, PA-C 07/10/14 [place], MD 07/10/14 2155

## 2014-07-09 NOTE — Discharge Instructions (Signed)
Return to the emergency room with worsening of symptoms, new symptoms or with symptoms that are concerning  Keep wound dry and do not remove dressing for 24 hours if possible. After that, wash gently morning and night (every 12 hours) with soap and water. Use a topical antibiotic ointment and cover with a bandaid or gauze.    Do NOT use rubbing alcohol or hydrogen peroxide, do not soak the area   Present to your primary care doctor or the urgent care of your choice, or the ED for suture removal in 7-10 days.   Every attempt was made to remove foreign body (contaminants) from the wound.  However, there is always a chance that some may remain in the wound. This can  increase your risk of infection.   If you see signs of infection (warmth, redness, tenderness, pus, sharp increase in pain, fever, red streaking in the skin) immediately return to the emergency department.   After the wound heals fully, apply sunscreen for 6-12 months to minimize scarring.   Please take all of your antibiotics until finished!   You may develop abdominal discomfort or diarrhea from the antibiotic.  You may help offset this with probiotics which you can buy or get in yogurt. Do not eat  or take the probiotics until 2 hours after your antibiotic.  Laceration Care, Adult A laceration is a cut or lesion that goes through all layers of the skin and into the tissue just beneath the skin. TREATMENT  Some lacerations may not require closure. Some lacerations may not be able to be closed due to an increased risk of infection. It is important to see your caregiver as soon as possible after an injury to minimize the risk of infection and maximize the opportunity for successful closure. If closure is appropriate, pain medicines may be given, if needed. The wound will be cleaned to help prevent infection. Your caregiver will use stitches (sutures), staples, wound glue (adhesive), or skin adhesive strips to repair the laceration. These  tools bring the skin edges together to allow for faster healing and a better cosmetic outcome. However, all wounds will heal with a scar. Once the wound has healed, scarring can be minimized by covering the wound with sunscreen during the day for 1 full year. HOME CARE INSTRUCTIONS  For sutures or staples:  Keep the wound clean and dry.  If you were given a bandage (dressing), you should change it at least once a day. Also, change the dressing if it becomes wet or dirty, or as directed by your caregiver.  Wash the wound with soap and water 2 times a day. Rinse the wound off with water to remove all soap. Pat the wound dry with a clean towel.  After cleaning, apply a thin layer of the antibiotic ointment as recommended by your caregiver. This will help prevent infection and keep the dressing from sticking.  You may shower as usual after the first 24 hours. Do not soak the wound in water until the sutures are removed.  Only take over-the-counter or prescription medicines for pain, discomfort, or fever as directed by your caregiver.  Get your sutures or staples removed as directed by your caregiver. For skin adhesive strips:  Keep the wound clean and dry.  Do not get the skin adhesive strips wet. You may bathe carefully, using caution to keep the wound dry.  If the wound gets wet, pat it dry with a clean towel.  Skin adhesive strips will fall off  on their own. You may trim the strips as the wound heals. Do not remove skin adhesive strips that are still stuck to the wound. They will fall off in time. For wound adhesive:  You may briefly wet your wound in the shower or bath. Do not soak or scrub the wound. Do not swim. Avoid periods of heavy perspiration until the skin adhesive has fallen off on its own. After showering or bathing, gently pat the wound dry with a clean towel.  Do not apply liquid medicine, cream medicine, or ointment medicine to your wound while the skin adhesive is in  place. This may loosen the film before your wound is healed.  If a dressing is placed over the wound, be careful not to apply tape directly over the skin adhesive. This may cause the adhesive to be pulled off before the wound is healed.  Avoid prolonged exposure to sunlight or tanning lamps while the skin adhesive is in place. Exposure to ultraviolet light in the first year will darken the scar.  The skin adhesive will usually remain in place for 5 to 10 days, then naturally fall off the skin. Do not pick at the adhesive film. You may need a tetanus shot if:  You cannot remember when you had your last tetanus shot.  You have never had a tetanus shot. If you get a tetanus shot, your arm may swell, get red, and feel warm to the touch. This is common and not a problem. If you need a tetanus shot and you choose not to have one, there is a rare chance of getting tetanus. Sickness from tetanus can be serious. SEEK MEDICAL CARE IF:   You have redness, swelling, or increasing pain in the wound.  You see a red line that goes away from the wound.  You have yellowish-white fluid (pus) coming from the wound.  You have a fever.  You notice a bad smell coming from the wound or dressing.  Your wound breaks open before or after sutures have been removed.  You notice something coming out of the wound such as wood or glass.  Your wound is on your hand or foot and you cannot move a finger or toe. SEEK IMMEDIATE MEDICAL CARE IF:   Your pain is not controlled with prescribed medicine.  You have severe swelling around the wound causing pain and numbness or a change in color in your arm, hand, leg, or foot.  Your wound splits open and starts bleeding.  You have worsening numbness, weakness, or loss of function of any joint around or beyond the wound.  You develop painful lumps near the wound or on the skin anywhere on your body. MAKE SURE YOU:   Understand these instructions.  Will watch your  condition.  Will get help right away if you are not doing well or get worse. Document Released: 03/19/2005 Document Revised: 06/11/2011 Document Reviewed: 09/12/2010 [facility] Patient Information 2015 [place], [place]. This information is not intended to replace advice given to you by your health care provider. Make sure you discuss any questions you have with your health care provider.

## 2014-07-09 NOTE — ED Notes (Signed)
While cutting cabbage her hand slip and was sliced by the large knife. 1 inch seemingly superficial laceration to the posterior aspect of left hand

## 2014-07-09 NOTE — ED Notes (Signed)
Wrapped with nonstick and kerlix

## 2014-07-13 ENCOUNTER — Emergency Department (HOSPITAL_COMMUNITY)
Admission: EM | Admit: 2014-07-13 | Discharge: 2014-07-13 | Disposition: A | Discharge status: Home / Self Care | Payer: Self-pay | Attending: Emergency Medicine | Admitting: Emergency Medicine

## 2014-07-13 ENCOUNTER — Emergency Department (HOSPITAL_COMMUNITY): Payer: Self-pay

## 2014-07-13 ENCOUNTER — Encounter (HOSPITAL_COMMUNITY): Payer: Self-pay | Admitting: Emergency Medicine

## 2014-07-13 DIAGNOSIS — Z8742 Personal history of other diseases of the female genital tract: Secondary | ICD-10-CM | POA: Insufficient documentation

## 2014-07-13 DIAGNOSIS — F329 Major depressive disorder, single episode, unspecified: Secondary | ICD-10-CM | POA: Insufficient documentation

## 2014-07-13 DIAGNOSIS — Z8719 Personal history of other diseases of the digestive system: Secondary | ICD-10-CM | POA: Insufficient documentation

## 2014-07-13 DIAGNOSIS — R0789 Other chest pain: Secondary | ICD-10-CM | POA: Insufficient documentation

## 2014-07-13 DIAGNOSIS — Z720 Tobacco use: Secondary | ICD-10-CM | POA: Insufficient documentation

## 2014-07-13 DIAGNOSIS — Z79899 Other long term (current) drug therapy: Secondary | ICD-10-CM | POA: Insufficient documentation

## 2014-07-13 DIAGNOSIS — F419 Anxiety disorder, unspecified: Secondary | ICD-10-CM | POA: Insufficient documentation

## 2014-07-13 DIAGNOSIS — R1084 Generalized abdominal pain: Secondary | ICD-10-CM | POA: Insufficient documentation

## 2014-07-13 DIAGNOSIS — J45909 Unspecified asthma, uncomplicated: Secondary | ICD-10-CM | POA: Insufficient documentation

## 2014-07-13 DIAGNOSIS — Z7982 Long term (current) use of aspirin: Secondary | ICD-10-CM | POA: Insufficient documentation

## 2014-07-13 DIAGNOSIS — M79622 Pain in left upper arm: Secondary | ICD-10-CM | POA: Insufficient documentation

## 2014-07-13 DIAGNOSIS — R011 Cardiac murmur, unspecified: Secondary | ICD-10-CM | POA: Insufficient documentation

## 2014-07-13 DIAGNOSIS — Z3202 Encounter for pregnancy test, result negative: Secondary | ICD-10-CM | POA: Insufficient documentation

## 2014-07-13 DIAGNOSIS — Z792 Long term (current) use of antibiotics: Secondary | ICD-10-CM | POA: Insufficient documentation

## 2014-07-13 LAB — CBC WITH DIFFERENTIAL/PLATELET
Basophils Absolute: 0 10*3/uL (ref 0.0–0.1)
Basophils Relative: 0 % (ref 0–1)
EOS PCT: 5 % (ref 0–5)
Eosinophils Absolute: 0.4 10*3/uL (ref 0.0–0.7)
HEMATOCRIT: 37.4 % (ref 36.0–46.0)
HEMOGLOBIN: 12.5 g/dL (ref 12.0–15.0)
LYMPHS PCT: 48 % — AB (ref 12–46)
Lymphs Abs: 3.3 10*3/uL (ref 0.7–4.0)
MCH: 28 pg (ref 26.0–34.0)
MCHC: 33.4 g/dL (ref 30.0–36.0)
MCV: 83.7 fL (ref 78.0–100.0)
MONO ABS: 0.6 10*3/uL (ref 0.1–1.0)
MONOS PCT: 8 % (ref 3–12)
Neutro Abs: 2.7 10*3/uL (ref 1.7–7.7)
Neutrophils Relative %: 39 % — ABNORMAL LOW (ref 43–77)
Platelets: 182 10*3/uL (ref 150–400)
RBC: 4.47 MIL/uL (ref 3.87–5.11)
RDW: 13.2 % (ref 11.5–15.5)
WBC: 6.9 10*3/uL (ref 4.0–10.5)

## 2014-07-13 LAB — COMPREHENSIVE METABOLIC PANEL
ALBUMIN: 4 g/dL (ref 3.5–5.2)
ALT: 10 U/L (ref 0–35)
ANION GAP: 7 (ref 5–15)
AST: 16 U/L (ref 0–37)
Alkaline Phosphatase: 58 U/L (ref 39–117)
BUN: 6 mg/dL (ref 6–23)
CALCIUM: 8.9 mg/dL (ref 8.4–10.5)
CO2: 26 mmol/L (ref 19–32)
CREATININE: 0.89 mg/dL (ref 0.50–1.10)
Chloride: 104 mmol/L (ref 96–112)
GFR calc Af Amer: 89 mL/min — ABNORMAL LOW (ref >90)
GFR, EST NON AFRICAN AMERICAN: 77 mL/min — AB (ref >90)
Glucose, Bld: 89 mg/dL (ref 70–99)
Potassium: 3.5 mmol/L (ref 3.5–5.1)
Sodium: 137 mmol/L (ref 135–145)
Total Bilirubin: 0.2 mg/dL — ABNORMAL LOW (ref 0.3–1.2)
Total Protein: 7.1 g/dL (ref 6.0–8.3)

## 2014-07-13 LAB — URINALYSIS, ROUTINE W REFLEX MICROSCOPIC
BILIRUBIN URINE: NEGATIVE
GLUCOSE, UA: NEGATIVE mg/dL
HGB URINE DIPSTICK: NEGATIVE
Ketones, ur: NEGATIVE mg/dL
Leukocytes, UA: NEGATIVE
Nitrite: NEGATIVE
PROTEIN: NEGATIVE mg/dL
Specific Gravity, Urine: 1.004 — ABNORMAL LOW (ref 1.005–1.030)
Urobilinogen, UA: 0.2 mg/dL (ref 0.0–1.0)
pH: 7 (ref 5.0–8.0)

## 2014-07-13 LAB — I-STAT TROPONIN, ED: TROPONIN I, POC: 0 ng/mL (ref 0.00–0.08)

## 2014-07-13 LAB — PREGNANCY, URINE: PREG TEST UR: NEGATIVE

## 2014-07-13 IMAGING — CR DG CHEST 2V
2 series · 2 of 2 positions shown · non-contrast
Comparison: [DATE]

CLINICAL DATA: Left side pain for 2 days, axillary pain

EXAM:
CHEST  2 VIEW

[w chest pa]
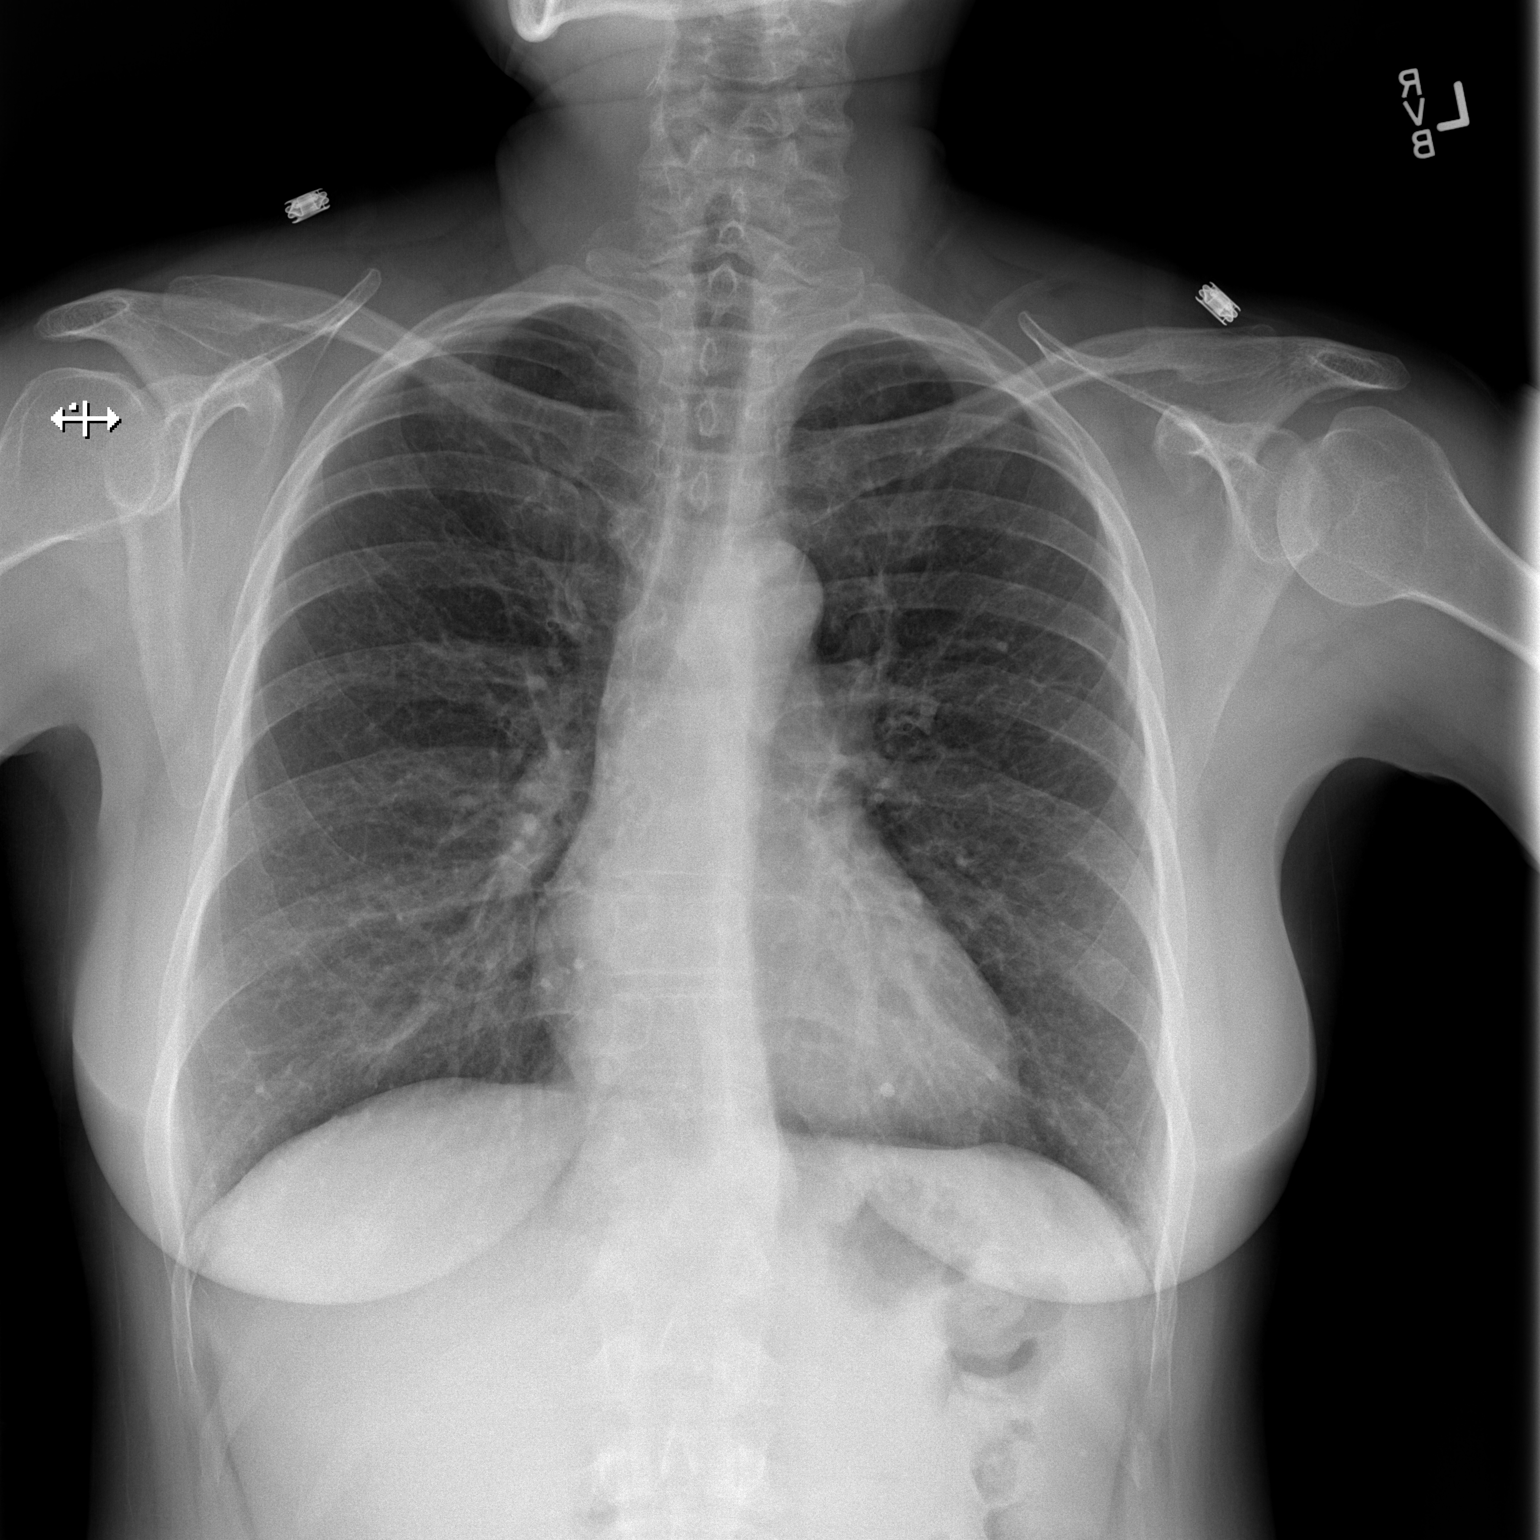

[w chest lat]
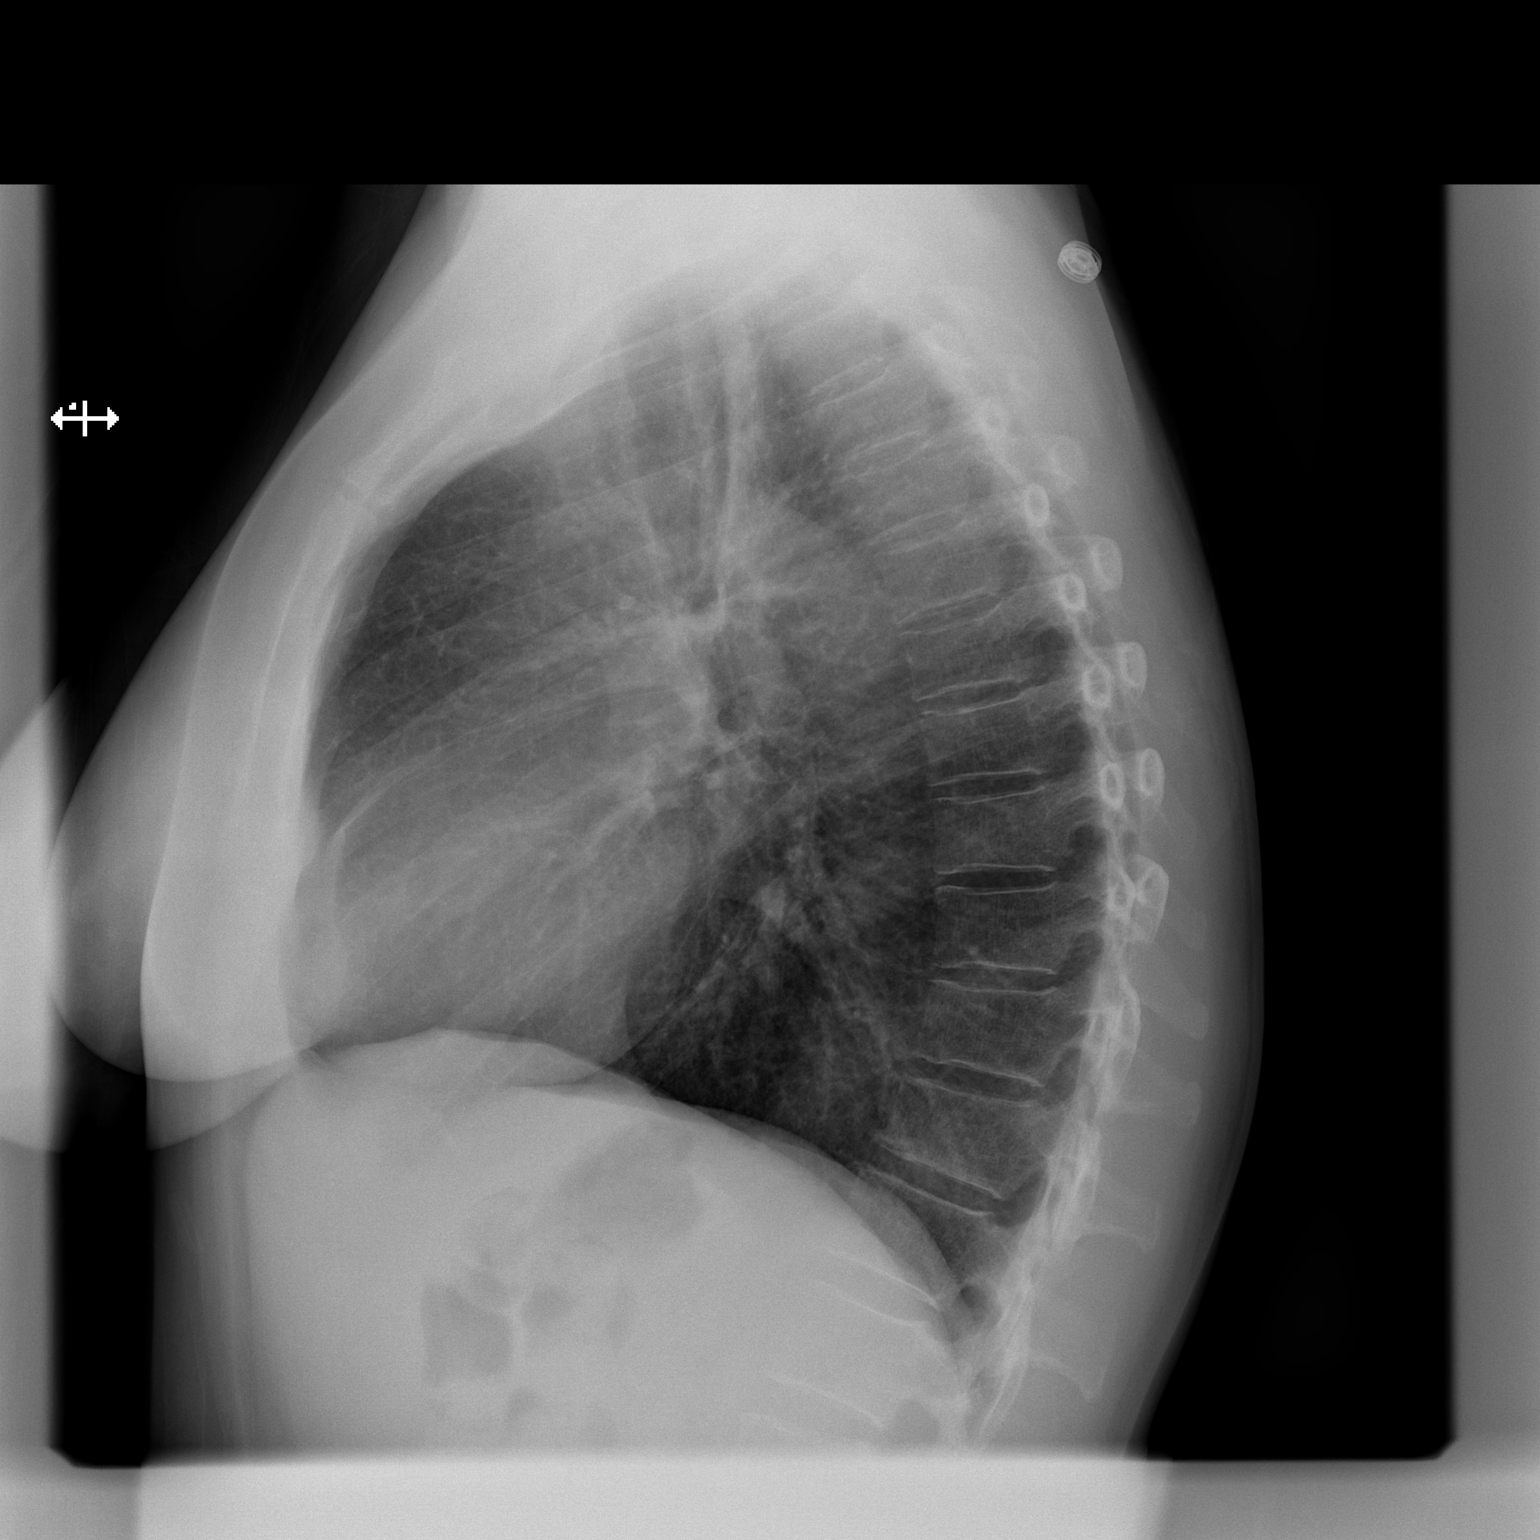

[2 of 2 positions shown; findings below may reference images not displayed]

FINDINGS: Cardiomediastinal silhouette is stable. No acute infiltrate or
pleural effusion. No pulmonary edema. Bony thorax is unremarkable.
IMPRESSION: No active cardiopulmonary disease.

## 2014-07-13 MED ORDER — ONDANSETRON HCL 4 MG PO TABS: 4.0000 mg | ORAL_TABLET | Freq: Four times a day (QID) | ORAL | Status: DC

## 2014-07-13 MED ORDER — IBUPROFEN 800 MG PO TABS: 800.0000 mg | ORAL_TABLET | Freq: Three times a day (TID) | ORAL | Status: DC

## 2014-07-13 MED ORDER — IBUPROFEN 800 MG PO TABS
800.0000 mg | ORAL_TABLET | Freq: Once | ORAL | Status: AC
  Administered 2014-07-13: 800 mg via ORAL
  Filled 2014-07-13: qty 1

## 2014-07-13 MED ORDER — ONDANSETRON 8 MG PO TBDP
8.0000 mg | ORAL_TABLET | Freq: Once | ORAL | Status: AC
  Administered 2014-07-13: 8 mg via ORAL
  Filled 2014-07-13: qty 1

## 2014-07-13 NOTE — ED Notes (Signed)
Was here Friday from cutting her left hand while at work. Yesterday morning she began feeling pain to left axillary area and left side. States she feels nauseous, and vomited twice. Says she's been having a lot of chills and sweats, and that she took some aspirin around two hours before coming here. Suture site well approximated, no drainage visible, mild skin irritation around sutures.

## 2014-07-13 NOTE — Discharge Instructions (Signed)
Chest Wall Pain Chest wall pain is pain in or around the bones and muscles of your chest. It may take up to 6 weeks to get better. It may take longer if you must stay physically active in your work and activities.  CAUSES  Chest wall pain may happen on its own. However, it may be caused by:  A viral illness like the flu.  Injury.  Coughing.  Exercise.  Arthritis.  Fibromyalgia.  Shingles. HOME CARE INSTRUCTIONS   Avoid overtiring physical activity. Try not to strain or perform activities that cause pain. This includes any activities using your chest or your abdominal and side muscles, especially if heavy weights are used.  Put ice on the sore area.  Put ice in a plastic bag.  Place a towel between your skin and the bag.  Leave the ice on for 15-20 minutes per hour while awake for the first 2 days.  Only take over-the-counter or prescription medicines for pain, discomfort, or fever as directed by your caregiver. SEEK IMMEDIATE MEDICAL CARE IF:   Your pain increases, or you are very uncomfortable.  You have a fever.  Your chest pain becomes worse.  You have new, unexplained symptoms.  You have nausea or vomiting.  You feel sweaty or lightheaded.  You have a cough with phlegm (sputum), or you cough up blood. MAKE SURE YOU:   Understand these instructions.  Will watch your condition.  Will get help right away if you are not doing well or get worse. Document Released: 03/19/2005 Document Revised: 06/11/2011 Document Reviewed: 11/13/2010 [facility] Patient Information 2015 [place], [place]. This information is not intended to replace advice given to you by your health care provider. Make sure you discuss any questions you have with your health care provider.  Nausea and Vomiting Nausea is a sick feeling that often comes before throwing up (vomiting). Vomiting is a reflex where stomach contents come out of your mouth. Vomiting can cause severe loss of body fluids  (dehydration). Children and elderly adults can become dehydrated quickly, especially if they also have diarrhea. Nausea and vomiting are symptoms of a condition or disease. It is important to find the cause of your symptoms. CAUSES   Direct irritation of the stomach lining. This irritation can result from increased acid production (gastroesophageal reflux disease), infection, food poisoning, taking certain medicines (such as nonsteroidal anti-inflammatory drugs), alcohol use, or tobacco use.  Signals from the brain.These signals could be caused by a headache, heat exposure, an inner ear disturbance, increased pressure in the brain from injury, infection, a tumor, or a concussion, pain, emotional stimulus, or metabolic problems.  An obstruction in the gastrointestinal tract (bowel obstruction).  Illnesses such as diabetes, hepatitis, gallbladder problems, appendicitis, kidney problems, cancer, sepsis, atypical symptoms of a heart attack, or eating disorders.  Medical treatments such as chemotherapy and radiation.  Receiving medicine that makes you sleep (general anesthetic) during surgery. DIAGNOSIS Your caregiver may ask for tests to be done if the problems do not improve after a few days. Tests may also be done if symptoms are severe or if the reason for the nausea and vomiting is not clear. Tests may include:  Urine tests.  Blood tests.  Stool tests.  Cultures (to look for evidence of infection).  X-rays or other imaging studies. Test results can help your caregiver make decisions about treatment or the need for additional tests. TREATMENT You need to stay well hydrated. Drink frequently but in small amounts.You may wish to drink water,  sports drinks, clear broth, or eat frozen ice pops or gelatin dessert to help stay hydrated.When you eat, eating slowly may help prevent nausea.There are also some antinausea medicines that may help prevent nausea. HOME CARE INSTRUCTIONS   Take  all medicine as directed by your caregiver.  If you do not have an appetite, do not force yourself to eat. However, you must continue to drink fluids.  If you have an appetite, eat a normal diet unless your caregiver tells you differently.  Eat a variety of complex carbohydrates (rice, wheat, potatoes, bread), lean meats, yogurt, fruits, and vegetables.  Avoid high-fat foods because they are more difficult to digest.  Drink enough water and fluids to keep your urine clear or pale yellow.  If you are dehydrated, ask your caregiver for specific rehydration instructions. Signs of dehydration may include:  Severe thirst.  Dry lips and mouth.  Dizziness.  Dark urine.  Decreasing urine frequency and amount.  Confusion.  Rapid breathing or pulse. SEEK IMMEDIATE MEDICAL CARE IF:   You have blood or brown flecks (like coffee grounds) in your vomit.  You have black or bloody stools.  You have a severe headache or stiff neck.  You are confused.  You have severe abdominal pain.  You have chest pain or trouble breathing.  You do not urinate at least once every 8 hours.  You develop cold or clammy skin.  You continue to vomit for longer than 24 to 48 hours.  You have a fever. MAKE SURE YOU:   Understand these instructions.  Will watch your condition.  Will get help right away if you are not doing well or get worse. Document Released: 03/19/2005 Document Revised: 06/11/2011 Document Reviewed: 08/16/2010 [facility] Patient Information 2015 [place], [place]. This information is not intended to replace advice given to you by your health care provider. Make sure you discuss any questions you have with your health care provider.   Emergency Department Resource Guide 1) Find a Doctor and Pay Out of Pocket Although you won't have to find out who is covered by your insurance plan, it is a good idea to ask around and get recommendations. You will then need to call the office and see  if the doctor you have chosen will accept you as a new patient and what types of options they offer for patients who are self-pay. Some doctors offer discounts or will set up payment plans for their patients who do not have insurance, but you will need to ask so you aren't surprised when you get to your appointment.  2) Contact Your Local Health Department Not all health departments have doctors that can see patients for sick visits, but many do, so it is worth a call to see if yours does. If you don't know where your local health department is, you can check in your phone book. The CDC also has a tool to help you locate your state's health department, and many state websites also have listings of all of their local health departments.  3) Find a [facility] If your illness is not likely to be very severe or complicated, you may want to try a walk in clinic. These are popping up all over the country in pharmacies, drugstores, and shopping centers. They're usually staffed by nurse practitioners or physician assistants that have been trained to treat common illnesses and complaints. They're usually fairly quick and inexpensive. However, if you have serious medical issues or chronic medical problems, these are probably not  your best option.  No Primary Care Doctor: - Call Health Connect at  [phone] - they can help you locate a primary care doctor that  accepts your insurance, provides certain services, etc. - Physician Referral Service- [phone]  Chronic Pain Problems: Organization         Address  Phone   Notes  [facility]  [phone] Patients need to be referred by their primary care doctor.   Medication Assistance: Organization         Address  Phone   Notes  [facility] Medication [facility] [address] [address] [place], [place] [address] [phone] --Must be a resident of [facility] -- Must have NO insurance coverage whatsoever (no  Medicaid/ Medicare, etc.) -- The pt. MUST have a primary care doctor that directs their care regularly and follows them in the community   Med[facility]ist  [phone]   [employer]  [phone]    Agencies that provide inexpensive medical care: Organization         Address  Phone   Notes  [place]  [phone]   [facility]    [phone]   [facility] [place],  [phone]   [place] [address] [place] [phone]   Planned Parenthood    [phone]   [facility]    [phone]   [place] [address], [place] Phone:  [phone], Fax:  [phone] Hours of Operation:  9 am - 6 pm, M-F.  Also accepts Medicaid/Medicare and self-pay.  [facility] for [address], [address], [place] Phone: [phone], Fax: [phone]. Hours of Operation:  8:30 am - 5:30 pm, M-F.  Also accepts Medicaid and self-pay.  [facility] [address], [place] Phone: [phone]   [place], [place], [place] [phone], Ext. [phone] Mondays & Thursdays: 7-9 AM.  First 15 patients are seen on a first come, first serve basis.    [place] Providers:  Organization         Address  Phone   Notes  [facility] [address], [place] [phone] Also accepts self-pay patients.  [facility] [address], [place]  [phone]   [place], [address], [place] [phone]   [facility] [address], [place] [phone]   Lucianne Lei [address], [place]   [phone] Only accepts [place] Access [place] patients after they have their name applied to their card.    Self-Pay (no insurance) in [facility]:  Organization         Address  Phone   Notes  Sickle Cell Patients, [facility] [place] [phone]   [facility] [place] [phone]   [facility] [place]  [place], [place], [place] [phone]   [facility]/Dr. Osei-Bonsu  [address], [place] or [address], [place] [phone] Phone number for both [place] and [place] locations is the same.  [facility] and [place]  Dr, Lady Gary [phone]   [facility] [address] [place] or [address] [phone]   [facility] [address] [place] [phone], phone; [phone], fax Sees patients 1st and 3rd Saturday of every month.  Must not qualify for public or private insurance (i.e. Medicaid, Medicare, [place] Health Choice, Veterans' Benefits)  Household income should be no more than 200% of the poverty level The clinic cannot treat you if you are pregnant or think you are pregnant  Sexually transmitted diseases are not treated at the clinic.    Dental Care: Organization         Address  Phone  Notes  [facility] of [facility] [place] [phone] Accepts children up to age 72 who are enrolled in [place] or [place]; pregnant women with a Medicaid card; and children who have applied for Medicaid or [place] Health Choice, but were declined, whose parents can pay a reduced fee at time of service.  [facility] Department of [facility]  [address], [place] [phone] Accepts children up to age 51 who are enrolled in [place] or [place]; pregnant women with a Medicaid card; and children who have applied for Medicaid or [place] Health Choice,  but were declined, whose parents can pay a reduced fee at time of service.  [facility]  [place] [phone] Patients are seen by appointment only. Walk-ins are not accepted. [place] will see patients 37 years of age and older. Monday - Tuesday (8am-5pm) Most Wednesdays (8:30-5pm) $30 per visit, cash only  [facility] Adult Dental Access PROGRAM  [address], [facility] [phone] Patients are seen by appointment only. Walk-ins are not accepted. [place] will see patients 1 years of age and older. One Wednesday Evening (Monthly: Volunteer Based).  $30 per visit, cash only  [facility]  [phone] for adults; Children under age 10, call Graduate Pediatric Dentistry at [phone]. Children aged 25-14, please call [phone] to request a pediatric application.  Dental services are provided in all areas of dental care including fillings, crowns and bridges, complete and partial dentures, implants, gum treatment, root canals, and extractions. Preventive care is also provided. Treatment is provided to both adults and children. Patients are selected via a lottery and there is often a waiting list.   [facility] [address], [place]  [phone] [email or web address]   [facility] [address] [place], [place] [phone], Ext. [phone] Second and Fourth Thursday of each month, opens at 6:30 AM; Clinic ends at 9 AM.  Patients are seen on a first-come first-served basis, and a limited number are seen during each clinic.   [facility]  [address] Hillard Danker [place], [place] [phone]   Eligibility Requirements You must have lived in [place], [place], or [place] counties for at least the last three months.   You cannot be eligible for state or federal sponsored [employer], including [employer], [place], or [employer].   You generally  cannot be eligible for healthcare insurance through your employer.    How to apply: Eligibility screenings are held every Tuesday and Wednesday afternoon from 1:00 pm until 4:00 pm. You do not need an appointment for the interview!  [facility] [address]  Barbara Cower [place], [place]   [place]  [place] Department  [place]  [phone]    Behavioral Health Resources in the Community: Intensive Outpatient Programs Organization         Address  Phone  Notes  [place]. [address], [place], [place] [phone]   [facility] [address] [place], [place]   ADS: Alcohol & Drug S[facility]cs [address] [place], [place]   [place] [address]  [place], [place] or [phone]   Substance Abuse Resources Organization         Address  Phone  Notes  Alcohol and Drug Services  [phone]   [place]  [phone]   [facility] [place]   Chinita Pester  [phone]   Residential & Outpatient Substance Abuse Program  [phone]   Psychological Services Organization         Address  Phone  Notes  [facility]  [place]  [phone]   [place] [address] [place] or [phone]    Mobile Crisis Teams Organization         Address  Phone  Notes  [facility], Mobile Crisis Care Unit  [phone]   [facility]  [address] [place], [place]   [place] Levels [address], [place] [phone]    Self-Help/Support Groups Organization         Address  Phone             Notes  [place]. of [place] - variety of support groups  [place] Call for more  information  Narcotics Anonymous (NA), [facility] [address], [employer] [place]  2 meetings at this location   Special educational needs teacher         Address  Phone  Notes  ASAP Residential Treatment [place],    [place]  [phone]   [facility]  [address], [place] [phone], [place], [place]   [place], [place] [phone] Admissions: 8am-3pm M-F  Incentives Substance [place] [address],    [place], [place] [phone]   [facility] [address] [place], [place], [place]   [facility] [address]  [place], [place]   [facility]ight Programs - Intensive Outpatient [address] Kristeen [address], [place], [place]   [facility] ([place].,  [place], [place] [phone] or [phone]   [facility] Services (RTS) [address] [place], [place] Accepts Medicaid  [facility] [place] [address],  [place] [phone] Substance Abuse/Addiction Treatment   [facility] Organization         Address  Phone  Notes  [facility]  [phone]   Domenic Schwab, PhD [address] Arlis Porta [place], [place]   [phone] or [phone]   [place], [place] [phone]   [place] [address] [place], [place] [phone] Insurance/Medicaid/sponsorship through [employer] and Families [address], JFH [address]  [place], [place] [phone] Therapy/tele-psych/case  °[facility] [address]  ° [place], [place] [phone]    °Dr. Arfeen  ([phone]   °[facility] [place]  [employer] [place] [facility] 1) [address], [place] °2) [address], [place] °3)  [address], [place] [phone]  [phone] °([phone]) [phone] ° °([phone]) [phone]   °[place] Child Abuse Hotline (336) [phone] or ([phone]) [phone] (After Hours)    ° ° °

## 2014-07-13 NOTE — ED Provider Notes (Signed)
CSN: [id number]     Arrival date & time 07/13/14  1534 History   First MD Initiated Contact with Patient 07/13/14 1832     Chief Complaint  Patient presents with  . Arm Pain  . Emesis     (Consider location/radiation/quality/duration/timing/severity/associated sxs/prior Treatment) HPI Linda Cervantes is a 49 year old female who presents the ER complaining of axillary pain, nausea and vomiting. Patient states on Sunday she was working, doing more heavy lifting than usual, lifting a lot of heavy trays at work. Patient states Sunday night she began having pain in her upper left chest and axilla. Patient states the pain has been constant, worse with any movement since then. Patient reports this morning she had an gradual onset of nausea with 2 episodes of nonbilious, nonbloody emesis. Patient states her nausea persisted until she got here was given antiemetics. Patient denies fever, abdominal pain, diarrhea, chest pain, shortness of breath, palpitations, headache, dizziness, weakness, dysuria.  Past Medical History  Diagnosis Date  . Anxiety   . Depression   . Substance abuse Clean as of 2009    Crack cocaine  . GERD (gastroesophageal reflux disease)   . Heart murmur   . Back pain   . Endometriosis   . 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 Asthma    Past Surgical History  Procedure Laterality Date  . Cesarean section    . Tubal ligation  2000  . Foot surgery     Family History  Problem Relation Age of Onset  . Hypertension Father   . Diabetes Maternal Grandmother    History  Substance Use Topics  . Smoking status: Current Every Day Smoker -- 1.00 packs/day    Types: Cigarettes  . Smokeless tobacco: Former Systems developer  . Alcohol Use: Yes   OB History    Gravida Para Term Preterm AB TAB SAB Ectopic Multiple Living   3 3 3       3      Review of Systems  Constitutional: Negative for fever.  HENT: Negative for trouble swallowing.   Eyes: Negative for visual disturbance.    Respiratory: Negative for shortness of breath.   Cardiovascular: Negative for chest pain.  Gastrointestinal: Negative for nausea, vomiting and abdominal pain.  Genitourinary: Negative for dysuria.  Musculoskeletal: Positive for myalgias. Negative for neck pain.  Skin: Negative for rash.  Neurological: Negative for dizziness, weakness and numbness.  Psychiatric/Behavioral: Negative.       Allergies  Amitriptyline hcl  Home Medications   Prior to Admission medications   Medication Sig Start Date End Date Taking? Authorizing Provider  acetaminophen (TYLENOL) 500 MG tablet Take 500 mg by mouth every 6 (six) hours as needed for mild pain or headache.   Yes Historical Provider, MD  albuterol (PROVENTIL HFA;VENTOLIN HFA) 108 (90 BASE) MCG/ACT inhaler Inhale 2 puffs into the lungs every 6 (six) hours as needed for wheezing (wheezing).    Yes Historical Provider, MD  aspirin 325 MG tablet Take 325 mg by mouth daily as needed for mild pain.   Yes Historical Provider, MD  cephALEXin (KEFLEX) 500 MG capsule Take 1 capsule (500 mg total) by mouth 4 (four) times daily. 07/09/14  Yes Al Corpus, PA-C  hydrOXYzine (ATARAX/VISTARIL) 50 MG tablet Take 50 mg by mouth 2 (two) times daily.   Yes Historical Provider, MD  methadone (DOLOPHINE) 10 MG/5ML solution Take 130 mg by mouth daily.   Yes Historical Provider, MD  QUEtiapine (SEROQUEL XR) 300 MG 24 hr tablet Take 300 mg  by mouth at bedtime.   Yes Historical Provider, MD  sertraline (ZOLOFT) 100 MG tablet Take 100 mg by mouth daily.   Yes Historical Provider, MD  amoxicillin-clavulanate (AUGMENTIN) 875-125 MG per tablet Take 1 tablet by mouth 2 (two) times daily. Patient not taking: Reported on 07/09/2014 06/08/14   Sharon Mt Street, MD  ibuprofen (ADVIL,MOTRIN) 800 MG tablet Take 1 tablet (800 mg total) by mouth 3 (three) times daily. 07/13/14   Dahlia Bailiff, PA-C  ondansetron (ZOFRAN) 4 MG tablet Take 1 tablet (4 mg total) by mouth every 6 (six)  hours. 07/13/14   Dahlia Bailiff, PA-C   BP 145/92 mmHg  Pulse 84  Temp(Src) 97.9 F (36.6 C) (Oral)  Resp 18  SpO2 100% Physical Exam  Constitutional: She is oriented to person, place, and time. She appears well-developed and well-nourished. No distress.  HENT:  Head: Normocephalic and atraumatic.  Mouth/Throat: Oropharynx is clear and moist. No oropharyngeal exudate.  Eyes: Right eye exhibits no discharge. Left eye exhibits no discharge. No scleral icterus.  Neck: Normal range of motion.  Cardiovascular: Normal rate, regular rhythm and normal heart sounds.   No murmur heard. Pulmonary/Chest: Effort normal and breath sounds normal. No accessory muscle usage. No tachypnea. No respiratory distress.    Abdominal: Soft. Normal appearance and bowel sounds are normal. There is generalized tenderness. There is no rigidity, no guarding, no tenderness at McBurney's point and negative Murphy's sign.  Mild, generalized tenderness noted on exam. No point tenderness.   Musculoskeletal: Normal range of motion. She exhibits no edema or tenderness.  Neurological: She is alert and oriented to person, place, and time. She has normal strength. No cranial nerve deficit or sensory deficit. Coordination normal. GCS eye subscore is 4. GCS verbal subscore is 5. GCS motor subscore is 6.  Skin: Skin is warm and dry. No rash noted. She is not diaphoretic.  Psychiatric: She has a normal mood and affect.  Nursing note and vitals reviewed.   ED Course  Procedures (including critical care time) Labs Review Labs Reviewed  CBC WITH DIFFERENTIAL/PLATELET - Abnormal; Notable for the following:    Neutrophils Relative % 39 (*)    Lymphocytes Relative 48 (*)    All other components within normal limits  COMPREHENSIVE METABOLIC PANEL - Abnormal; Notable for the following:    Total Bilirubin 0.2 (*)    GFR calc non Af Amer 77 (*)    GFR calc Af Amer 89 (*)    All other components within normal limits  URINALYSIS,  ROUTINE W REFLEX MICROSCOPIC - Abnormal; Notable for the following:    Specific Gravity, Urine 1.004 (*)    All other components within normal limits  PREGNANCY, URINE  I-STAT TROPOININ, ED  I-STAT TROPOININ, ED    Imaging Review Dg Chest 2 View  07/13/2014   CLINICAL DATA:  Left side pain for 2 days, axillary pain  EXAM: CHEST  2 VIEW  COMPARISON:  06/25/2013  FINDINGS: Cardiomediastinal silhouette is stable. No acute infiltrate or pleural effusion. No pulmonary edema. Bony thorax is unremarkable.  IMPRESSION: No active cardiopulmonary disease.   Electronically Signed   By: Lahoma Crocker M.D.   On: 07/13/2014 20:09     EKG Interpretation None    Sinus rhythm without injury or ectopy noted.  No change from last tracing.  EKG read by Adelene Amas  MDM   Final diagnoses:  Axillary pain, left  Chest wall pain   Pt's s/s consistent with musculoskeletal pain. Patient asymptomatic  after therapy with antiemetic and Motrin here. Patient's pain all tender in her upper pectoralis muscle. No concern for ACS. Wells criteria 0 for PE. Troponin negative. EKG without injury or ectopy noted. Patient afebrile, non-tachycardic, nontachypneic, non-hypoxic, well-appearing and in no acute distress. No concern for acute abdomen on exam. We'll discharge patient at this time, encourage RICE therapy and follow-up with her primary care provider.  BP 145/92 mmHg  Pulse 84  Temp(Src) 97.9 F (36.6 C) (Oral)  Resp 18  SpO2 100%  Signed,  Dahlia Bailiff, PA-C 9:25 PM  Patient discussed with Dr. Artis Delay, MD   Dahlia Bailiff, PA-C 07/13/14 2125  Serita Grit, MD 07/16/14 [phone]

## 2014-07-13 NOTE — ED Notes (Addendum)
pa at bedside. 

## 2014-07-19 ENCOUNTER — Ambulatory Visit: Payer: Self-pay

## 2014-07-27 ENCOUNTER — Ambulatory Visit: Payer: Self-pay

## 2014-08-06 ENCOUNTER — Encounter (HOSPITAL_COMMUNITY): Payer: Self-pay | Admitting: Emergency Medicine

## 2014-08-06 ENCOUNTER — Emergency Department (HOSPITAL_COMMUNITY)
Admission: EM | Admit: 2014-08-06 | Discharge: 2014-08-06 | Disposition: A | Discharge status: Home / Self Care | Payer: Self-pay | Attending: Emergency Medicine | Admitting: Emergency Medicine

## 2014-08-06 DIAGNOSIS — F329 Major depressive disorder, single episode, unspecified: Secondary | ICD-10-CM | POA: Insufficient documentation

## 2014-08-06 DIAGNOSIS — F419 Anxiety disorder, unspecified: Secondary | ICD-10-CM | POA: Insufficient documentation

## 2014-08-06 DIAGNOSIS — Z720 Tobacco use: Secondary | ICD-10-CM | POA: Insufficient documentation

## 2014-08-06 DIAGNOSIS — Z8719 Personal history of other diseases of the digestive system: Secondary | ICD-10-CM | POA: Insufficient documentation

## 2014-08-06 DIAGNOSIS — R011 Cardiac murmur, unspecified: Secondary | ICD-10-CM | POA: Insufficient documentation

## 2014-08-06 DIAGNOSIS — Z79899 Other long term (current) drug therapy: Secondary | ICD-10-CM | POA: Insufficient documentation

## 2014-08-06 DIAGNOSIS — J45909 Unspecified asthma, uncomplicated: Secondary | ICD-10-CM | POA: Insufficient documentation

## 2014-08-06 DIAGNOSIS — Z792 Long term (current) use of antibiotics: Secondary | ICD-10-CM | POA: Insufficient documentation

## 2014-08-06 DIAGNOSIS — Z0000 Encounter for general adult medical examination without abnormal findings: Secondary | ICD-10-CM | POA: Insufficient documentation

## 2014-08-06 DIAGNOSIS — Z8742 Personal history of other diseases of the female genital tract: Secondary | ICD-10-CM | POA: Insufficient documentation

## 2014-08-06 DIAGNOSIS — Z7982 Long term (current) use of aspirin: Secondary | ICD-10-CM | POA: Insufficient documentation

## 2014-08-06 DIAGNOSIS — Z791 Long term (current) use of non-steroidal anti-inflammatories (NSAID): Secondary | ICD-10-CM | POA: Insufficient documentation

## 2014-08-06 NOTE — ED Notes (Signed)
Per pt, states she has been sick this week and work is requesting she get a MDs note

## 2014-08-06 NOTE — Discharge Instructions (Signed)
Do not hesitate to return to the emergency room for any new, worsening or concerning symptoms. ° °Please obtain primary care using resource guide below. Let them know that you were seen in the emergency room and that they will need to obtain records for further outpatient management. ° ° ° °Emergency Department Resource Guide °1) Find a Doctor and Pay Out of Pocket °Although you won't have to find out who is covered by your insurance plan, it is a good idea to ask around and get recommendations. You will then need to call the office and see if the doctor you have chosen will accept you as a new patient and what types of options they offer for patients who are self-pay. Some doctors offer discounts or will set up payment plans for their patients who do not have insurance, but you will need to ask so you aren't surprised when you get to your appointment. ° °2) Contact Your Local Health Department °Not all health departments have doctors that can see patients for sick visits, but many do, so it is worth a call to see if yours does. If you don't know where your local health department is, you can check in your phone book. The CDC also has a tool to help you locate your state's health department, and many state websites also have listings of all of their local health departments. ° °3) Find a Walk-in Clinic °If your illness is not likely to be very severe or complicated, you may want to try a walk in clinic. These are popping up all over the country in pharmacies, drugstores, and shopping centers. They're usually staffed by nurse practitioners or physician assistants that have been trained to treat common illnesses and complaints. They're usually fairly quick and inexpensive. However, if you have serious medical issues or chronic medical problems, these are probably not your best option. ° °No Primary Care Doctor: °- Call [facility] at  832-8000 - they can help you locate a primary care doctor that  accepts your  insurance, provides certain services, etc. °- Physician Referral Service- 1-800-533-3463 ° °Chronic Pain Problems: °Organization         Address  Phone   Notes  °[facility]  (336) 297-2271 Patients need to be referred by their primary care doctor.  ° °Medication Assistance: °Organization         Address  Phone   Notes  °[place] [facility] [address] °[place], [place] [address] (336) 641-8030 --Must be a resident of [place] °-- Must have NO insurance coverage whatsoever (no Medicaid/ Medicare, etc.) °-- The pt. MUST have a primary care doctor that directs their care regularly and follows them in the community °  °[facility]  (866) 331-1348   °[employer]  (888) 892-1162   ° °Agencies that provide inexpensive medical care: °Organization         Address  Phone   Notes  °[facility]  (336) 832-8035   °[facility]    (336) 832-7272   °[facility] [address] °[place], [place] [address] (336) 832-4777   °[facility] [address], °[place] (336) 271-4999   °[facility]    (336) 373-0678   °[facility]    (336) 272-1050   °[facility] ° [address], [place] Phone:  (336) 832-4444, Fax:  (336) 832-4440 Hours of Operation:  9 am -   6 pm, M-F.  Also accepts Medicaid/Medicare and self-pay.  °[facility] ° [address], [place] Phone: (336) 832-3150, Fax: (336) [phone]. Hours of Operation:  8:30 am - 5:30 pm, M-F.  Also accepts Medicaid and self-pay.  °[facility] [place] [address], [place] Phone: (336) 878-6027   °[facility] [address], [place], [place] (336)723-1848, Ext. 123 Mondays & Thursdays: 7-9 AM.  First 15 patients are seen on a first come, first serve basis. °  ° °Medicaid-accepting [place] Providers: ° °Organization          Address  Phone   Notes  °[facility] [address], [place] (336) 641-2100 Also accepts self-pay patients.  °[facility] [address], [place] ° (336) 856-9996   °[facility] [address], [place] (336) 288-8857   °[facility] [address] [place] [address], [place] (336) 299-7000   °Veita Bland [address], [place]  ° (336) 373-1557 Only accepts [place] Access Medicaid patients after they have their name applied to their card.  ° °Self-Pay (no insurance) in [place]: ° °Organization         Address  Phone   Notes  °Sickle Cell Patients, [facility] [address], [place] (336) 832-1970   °[facility] [address], [place] (336) 832-4400   °[facility] ° [address], [place] (336) 992-4800   °[facility]/Dr. Osei-Bonsu ° [address] [place] [address], [place] or [address], [place] (336) 841-8500 Phone number for both [place] and [place] locations is the same.  °[facility] [address], [place] (336) 299-0000   °[facility] [address] [place] [address], [place] or [address] (336) 852-7530 °(336) 878-2260   °[facility] [address], [place] (336) 350-1642, phone; (336) 294-5005, fax Sees patients 1st and 3rd Saturday of every month.  Must not qualify for public or private insurance (i.e. Medicaid, Medicare, [place] Health Choice, Veterans' Benefits) • Household income should be no more than 200% of the poverty level •The clinic cannot treat you if you are pregnant or think you are pregnant • Sexually transmitted diseases are not treated at the clinic.  ° ° °Dental Care: °Organization         Address  Phone  Notes  °[facility] [address],  [place] (336) 641-6152 Accepts children up to age 21 who are enrolled in Medicaid or [place] Health Choice; pregnant women with a Medicaid card; and children who have applied for Medicaid or [place] Health Choice, but were declined, whose parents can pay a reduced fee at time of service.  °[facility] [place]  [address], [place] (336) 641-7733 Accepts children up to age 21 who are enrolled in Medicaid or [place] Health Choice; pregnant women with a Medicaid card; and children who have applied for Medicaid or [place] Health Choice, but were declined, whose parents can pay a reduced fee at time of service.  °[facility] ° [address],  (336) 641-4533 Patients are seen by appointment only. Walk-ins are not accepted. [facility] will see patients 18 years of age and   older. °Monday - Tuesday (8am-5pm) °Most Wednesdays (8:30-5pm) °$30 per visit, cash only  °[facility] ° [address], [place] (336) 641-4533 Patients are seen by appointment only. Walk-ins are not accepted. [facility] will see patients 18 years of age and older. °One Wednesday Evening (Monthly: Volunteer Based).  $30 per visit, cash only  °[facility]  (919) 537-3737 for adults; Children under age 4, call Graduate Pediatric Dentistry at ([phone]) [phone]. Children aged 4-14, please call (919) [phone] to request a pediatric application. ° Dental services are provided in all areas of dental care including fillings, crowns and bridges, complete and partial dentures, implants, gum treatment, root canals, and extractions. Preventive care is also provided. Treatment is provided to both adults and children. °Patients are selected via a lottery and there is often a waiting list. °  °[facility] [address], °[place] ° (336) 763-8833 [email or web address] °  °[facility] [address], [place], [place]  (336)723-1848, Ext. 123 Second and Fourth Thursday of each month, opens at 6:30 AM; Clinic ends at 9 AM.  Patients are seen on a first-come first-served basis, and a limited number are seen during each clinic.  ° °[facility] ° [address], [place], [place] (336) 723-7904   Eligibility Requirements °You must have lived in [place], [place], or [place] counties for at least the last three months. °  You cannot be eligible for state or federal sponsored healthcare insurance, including Veterans Administration, Medicaid, or Medicare. °  You generally cannot be eligible for healthcare insurance through your employer.  °  How to apply: °Eligibility screenings are held every Tuesday and Wednesday afternoon from 1:00 pm until 4:00 pm. You do not need an appointment for the interview!  °[facility] [address], [place], [place] 336-631-2330   °[facility]  336-342-8273   °[place] [facility]  336-703-3100   °[facility]  336-570-6415   ° °Behavioral Health Resources in the Community: °Intensive Outpatient Programs °Organization         Address  Phone  Notes  °[facility] [address], [place], [place] 336-878-6098   °[facility] [address], [place], [place] 336-832-9800   °[facility] [address], [place], [place] ° 336-882-2125   °[facility] [address],  °[place], [place] 1-800-853-5163 or 336-641-4981   °Substance Abuse Resources °Organization         Address  Phone  Notes  °[facility]  336-882-2125   °[facility]  336-784-9470   °[facility]  336-285-9073   °[facility]  336-845-3988   °Residential & Outpatient Substance Abuse Program  1-800-659-3381   °Psychological Services °Organization         Address  Phone  Notes  °[facility]  336- 832-9600   °[facility]  336- 378-7881    °[facility] [address], [place] 1-800-853-5163 or 336-641-4981   ° °Mobile Crisis Teams °Organization         Address  Phone  Notes  °[facility], Mobile Crisis Care Unit  1-877-626-1772   °Assertive °[facility] ° [address] [place], [place] 336-834-9664   °Sharon DeEsch [address] °[place] 336-554-5454   ° °Self-Help/Support Groups °Organization         Address    Phone             Notes  °[facility] - variety of support groups  336- 373-1402 Call for more information  °Narcotics Anonymous (NA), [facility] [address], °[place]  2 meetings at this location  ° °Residential Treatment Programs °Organization         Address  Phone  Notes  °[facility] [address],    °[place]  1-866-801-8205   °[facility] ° [address], [place], [place] 704-293-8524   °[facility] [address], [place] 336-845-3988 Admissions: 8am-3pm M-F  °[facility] [address]    °[place], [place] 336-841-1104   °[facility] [address], [place], [place] 336-379-7146   °[facility] [address]  °[place], [place] 336-285-9073   °[facility] - Intensive Outpatient [address], [place], [place] 336-852-3033   °[facility] [address]  °[place], [place] 1-877-615-2722 or 336-784-9470   °[facility] [address] [place], [place] 336-227-7417 Accepts Medicaid  °[facility] [address]  °[place] 1-800-659-3381 Substance Abuse/Addiction Treatment  ° °[place] Behavioral Health Resources °Organization         Address  Phone  Notes  °[facility]  (888) 581-9988   °Julie Brannon, PhD [address] [place], [place]   (336) 349-5553 or (336) 951-0000   °[facility]   [address] °[place], [place] (336) 349-4454   °[facility] [address], [place], [place] (336) 342-8316 Insurance/Medicaid/sponsorship through [facility]  °[facility] [address]                                    [place], [place] (336) 342-8316 Therapy/tele-psych/case  °[facility] [address]  ° [place], [place] (336) 349-2233    °Dr. Arfeen  (336) 349-4544   °[facility] [place]  [employer] [place] [facility] 1) [address], [place] °2) [address], [place] °3)  [address], [place] (336) 349-3220 °(336) 342-7768 ° °(336) 342-8140   °[place] [facility] (336) 342-1394 or (336) 342-3537 (After Hours)    ° ° ° °

## 2014-08-06 NOTE — ED Notes (Signed)
NP at bedside.

## 2014-08-06 NOTE — ED Provider Notes (Signed)
CSN: [id number]     Arrival date & time 08/06/14  0907 History   First MD Initiated Contact with Patient 08/06/14 [phone]     Chief Complaint  Patient presents with  . doctors note      (Consider location/radiation/quality/duration/timing/severity/associated sxs/prior Treatment) HPI   FONTAINE KOSSMAN is a 49 y.o. female requesting note to return to work. Patient states that she had a GI bug with nausea vomiting diarrhea 3 days ago which is now fully resolved. She denies fever, chills, active vomiting, diarrhea, abdominal pain. States she works at a [employer] and they are requiring a note to return to work.  Past Medical History  Diagnosis Date  . Anxiety   . Depression   . Substance abuse Clean as of 2009    Crack cocaine  . GERD (gastroesophageal reflux disease)   . Heart murmur   . Back pain   . Endometriosis   . 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 Asthma    Past Surgical History  Procedure Laterality Date  . Cesarean section    . Tubal ligation  2000  . Foot surgery     Family History  Problem Relation Age of Onset  . Hypertension Father   . Diabetes Maternal Grandmother    History  Substance Use Topics  . Smoking status: Current Every Day Smoker -- 1.00 packs/day    Types: Cigarettes  . Smokeless tobacco: Former Systems developer  . Alcohol Use: Yes   OB History    Gravida Para Term Preterm AB TAB SAB Ectopic Multiple Living   3 3 3       3      Review of Systems  10 systems reviewed and found to be negative, except as noted in the HPI.   Allergies  Amitriptyline hcl  Home Medications   Prior to Admission medications   Medication Sig Start Date End Date Taking? Authorizing Provider  acetaminophen (TYLENOL) 500 MG tablet Take 500 mg by mouth every 6 (six) hours as needed for mild pain or headache.    Historical Provider, MD  albuterol (PROVENTIL HFA;VENTOLIN HFA) 108 (90 BASE) MCG/ACT inhaler Inhale 2 puffs into the lungs every 6 (six) hours as needed for  wheezing (wheezing).     Historical Provider, MD  amoxicillin-clavulanate (AUGMENTIN) 875-125 MG per tablet Take 1 tablet by mouth 2 (two) times daily. Patient not taking: Reported on 07/09/2014 06/08/14   Sharon Mt Street, MD  aspirin 325 MG tablet Take 325 mg by mouth daily as needed for mild pain.    Historical Provider, MD  cephALEXin (KEFLEX) 500 MG capsule Take 1 capsule (500 mg total) by mouth 4 (four) times daily. 07/09/14   Al Corpus, PA-C  hydrOXYzine (ATARAX/VISTARIL) 50 MG tablet Take 50 mg by mouth 2 (two) times daily.    Historical Provider, MD  ibuprofen (ADVIL,MOTRIN) 800 MG tablet Take 1 tablet (800 mg total) by mouth 3 (three) times daily. 07/13/14   Dahlia Bailiff, PA-C  methadone (DOLOPHINE) 10 MG/5ML solution Take 130 mg by mouth daily.    Historical Provider, MD  ondansetron (ZOFRAN) 4 MG tablet Take 1 tablet (4 mg total) by mouth every 6 (six) hours. 07/13/14   Dahlia Bailiff, PA-C  QUEtiapine (SEROQUEL XR) 300 MG 24 hr tablet Take 300 mg by mouth at bedtime.    Historical Provider, MD  sertraline (ZOLOFT) 100 MG tablet Take 100 mg by mouth daily.    Historical Provider, MD   BP 122/84 mmHg  Pulse 84  Temp(Src) 97.5 F (36.4 C) (Oral)  Resp 17  SpO2 100% Physical Exam  Constitutional: She is oriented to person, place, and time. She appears well-developed and well-nourished. No distress.  HENT:  Head: Normocephalic and atraumatic.  Mouth/Throat: Oropharynx is clear and moist.  Eyes: Conjunctivae and EOM are normal. Pupils are equal, round, and reactive to light.  Neck: Normal range of motion.  Cardiovascular: Normal rate, regular rhythm and intact distal pulses.   Pulmonary/Chest: Effort normal and breath sounds normal. No respiratory distress. She has no wheezes. She has no rales. She exhibits no tenderness.  Abdominal: Soft. Bowel sounds are normal. She exhibits no distension and no mass. There is no tenderness. There is no rebound and no guarding.  Musculoskeletal: Normal  range of motion.  Neurological: She is alert and oriented to person, place, and time.  Skin: She is not diaphoretic.  Psychiatric: She has a normal mood and affect.  Nursing note and vitals reviewed.   ED Course  Procedures (including critical care time) Labs Review Labs Reviewed - No data to display  Imaging Review No results found.   EKG Interpretation None      MDM   Final diagnoses:  Normal physical exam    Filed Vitals:   08/06/14 0918  BP: 122/84  Pulse: 84  Temp: 97.5 F (36.4 C)  TempSrc: Oral  Resp: 17  SpO2: 100%    BRAXTYN DORFF is a pleasant 49 y.o. female requesting note to return to work, she had a gastroenteritis which is now fully resolved, abdominal exam is benign and patient states she is tolerating by mouth. I've advised her she needs to wash her hands frequently, wiped down bathroom says and door handles with dilute bleach solution.   Evaluation does not show pathology that would require ongoing emergent intervention or inpatient treatment. Pt is hemodynamically stable and mentating appropriately. Discussed findings and plan with patient/guardian, who agrees with care plan. All questions answered. Return precautions discussed and outpatient follow up given.    Monico Blitz, PA-C 08/06/14 [place] Yao, MD 08/09/14 [phone]

## 2016-04-12 ENCOUNTER — Emergency Department (HOSPITAL_COMMUNITY)
Admission: EM | Admit: 2016-04-12 | Discharge: 2016-04-13 | Disposition: A | Discharge status: Home / Self Care | Payer: Self-pay | Attending: Emergency Medicine | Admitting: Emergency Medicine

## 2016-04-12 ENCOUNTER — Encounter (HOSPITAL_COMMUNITY): Payer: Self-pay | Admitting: Emergency Medicine

## 2016-04-12 ENCOUNTER — Ambulatory Visit (HOSPITAL_COMMUNITY)
Admission: EM | Admit: 2016-04-12 | Discharge: 2016-04-12 | Disposition: A | Discharge status: Home / Self Care | Payer: Self-pay | Attending: Emergency Medicine | Admitting: Emergency Medicine

## 2016-04-12 DIAGNOSIS — J45909 Unspecified asthma, uncomplicated: Secondary | ICD-10-CM | POA: Insufficient documentation

## 2016-04-12 DIAGNOSIS — F17210 Nicotine dependence, cigarettes, uncomplicated: Secondary | ICD-10-CM | POA: Insufficient documentation

## 2016-04-12 DIAGNOSIS — N764 Abscess of vulva: Secondary | ICD-10-CM

## 2016-04-12 DIAGNOSIS — Z79899 Other long term (current) drug therapy: Secondary | ICD-10-CM | POA: Insufficient documentation

## 2016-04-12 DIAGNOSIS — N762 Acute vulvitis: Secondary | ICD-10-CM

## 2016-04-12 DIAGNOSIS — R102 Pelvic and perineal pain: Secondary | ICD-10-CM | POA: Insufficient documentation

## 2016-04-12 LAB — I-STAT BETA HCG BLOOD, ED (MC, WL, AP ONLY): I-stat hCG, quantitative: <5 m[IU]/mL (ref <5)

## 2016-04-12 LAB — BASIC METABOLIC PANEL
ANION GAP: 8 (ref 5–15)
BUN: 7 mg/dL (ref 6–20)
CO2: 24 mmol/L (ref 22–32)
Calcium: 9.1 mg/dL (ref 8.9–10.3)
Chloride: 106 mmol/L (ref 101–111)
Creatinine, Ser: 0.65 mg/dL (ref 0.44–1.00)
GFR calc Af Amer: >60 mL/min (ref >60)
Glucose, Bld: 89 mg/dL (ref 65–99)
POTASSIUM: 4.2 mmol/L (ref 3.5–5.1)
Sodium: 138 mmol/L (ref 135–145)

## 2016-04-12 LAB — CBC WITH DIFFERENTIAL/PLATELET
BASOS ABS: 0 10*3/uL (ref 0.0–0.1)
Basophils Relative: 0 %
Eosinophils Absolute: 0.1 10*3/uL (ref 0.0–0.7)
Eosinophils Relative: 1 %
HCT: 36.5 % (ref 36.0–46.0)
Hemoglobin: 12.2 g/dL (ref 12.0–15.0)
Lymphocytes Relative: 16 %
Lymphs Abs: 1.6 10*3/uL (ref 0.7–4.0)
MCH: 28 pg (ref 26.0–34.0)
MCHC: 33.4 g/dL (ref 30.0–36.0)
MCV: 83.9 fL (ref 78.0–100.0)
Monocytes Absolute: 0.6 10*3/uL (ref 0.1–1.0)
Monocytes Relative: 5 %
NEUTROS PCT: 78 %
Neutro Abs: 8.1 10*3/uL — ABNORMAL HIGH (ref 1.7–7.7)
Platelets: 196 10*3/uL (ref 150–400)
RBC: 4.35 MIL/uL (ref 3.87–5.11)
RDW: 14.1 % (ref 11.5–15.5)
WBC: 10.4 10*3/uL (ref 4.0–10.5)

## 2016-04-12 LAB — I-STAT CG4 LACTIC ACID, ED: LACTIC ACID, VENOUS: 1.16 mmol/L (ref 0.5–1.9)

## 2016-04-12 MED ORDER — ONDANSETRON HCL 4 MG/2ML IJ SOLN
4.0000 mg | Freq: Once | INTRAMUSCULAR | Status: AC
  Administered 2016-04-12: 4 mg via INTRAVENOUS
  Filled 2016-04-12: qty 2

## 2016-04-12 MED ORDER — MORPHINE SULFATE (PF) 4 MG/ML IV SOLN
4.0000 mg | Freq: Once | INTRAVENOUS | Status: AC
  Administered 2016-04-12: 4 mg via INTRAVENOUS
  Filled 2016-04-12: qty 1

## 2016-04-12 MED ORDER — IOPAMIDOL (ISOVUE-300) INJECTION 61%
INTRAVENOUS | Status: AC
  Filled 2016-04-12: qty 100

## 2016-04-12 MED ORDER — CLINDAMYCIN PHOSPHATE 600 MG/50ML IV SOLN
600.0000 mg | Freq: Once | INTRAVENOUS | Status: AC
Start: 2016-04-12 — End: 2016-04-12
  Administered 2016-04-12: 600 mg via INTRAVENOUS
  Filled 2016-04-12: qty 50

## 2016-04-12 MED ORDER — LIDOCAINE HCL (PF) 2 % IJ SOLN
INTRAMUSCULAR | Status: AC
  Filled 2016-04-12: qty 2

## 2016-04-12 MED ORDER — LIDOCAINE-EPINEPHRINE (PF) 2 %-1:200000 IJ SOLN
10.0000 mL | Freq: Once | INTRAMUSCULAR | Status: AC
  Administered 2016-04-12: 10 mL via INTRADERMAL
  Filled 2016-04-12: qty 20

## 2016-04-12 NOTE — ED Provider Notes (Signed)
CSN: [id number]     Arrival date & time 04/12/16  1825 History   First MD Initiated Contact with Patient 04/12/16 1940     Chief Complaint  Patient presents with  . Abscess   (Consider location/radiation/quality/duration/timing/severity/associated sxs/prior Treatment) 51 year old female presents to clinic with chief complaint of a labial abscess on her left labia. She denies fever, chills, abdominal pains, back pains, or other systemic symptoms. She reports she has had similar abscesses before and has not had prior complications with treatments   The history is provided by the patient.  Abscess    Past Medical History:  Diagnosis Date  . Anxiety   . Asthma   . Back pain   . Depression   . Endometriosis   . GERD (gastroesophageal reflux disease)   . Heart murmur   . 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 Substance abuse Clean as of 2009   Crack cocaine   Past Surgical History:  Procedure Laterality Date  . CESAREAN SECTION    . FOOT SURGERY    . TUBAL LIGATION  2000   Family History  Problem Relation Age of Onset  . Hypertension Father   . Diabetes Maternal Grandmother    Social History  Substance Use Topics  . Smoking status: Current Every Day Smoker    Packs/day: 1.00    Types: Cigarettes  . Smokeless tobacco: Former Systems developer  . Alcohol use No   OB History    Gravida Para Term Preterm AB Living   3 3 3     3    SAB TAB Ectopic Multiple Live Births           3     Review of Systems  Reason unable to perform ROS: As covered in HPI.  All other systems reviewed and are negative.   Allergies  Amitriptyline hcl  Home Medications   Prior to Admission medications   Medication Sig Start Date End Date Taking? Authorizing Provider  acetaminophen (TYLENOL) 500 MG tablet Take 500 mg by mouth every 6 (six) hours as needed for mild pain or headache.    Historical Provider, MD  albuterol (PROVENTIL HFA;VENTOLIN HFA) 108 (90 BASE) MCG/ACT inhaler Inhale 2 puffs  into the lungs every 6 (six) hours as needed for wheezing (wheezing).     Historical Provider, MD  amoxicillin-clavulanate (AUGMENTIN) 875-125 MG per tablet Take 1 tablet by mouth 2 (two) times daily. Patient not taking: Reported on 07/09/2014 06/08/14   Sharon Mt Street, MD  aspirin 325 MG tablet Take 325 mg by mouth daily as needed for mild pain.    Historical Provider, MD  cephALEXin (KEFLEX) 500 MG capsule Take 1 capsule (500 mg total) by mouth 4 (four) times daily. Patient not taking: Reported on 04/12/2016 07/09/14   Al Corpus, PA-C  hydrOXYzine (ATARAX/VISTARIL) 50 MG tablet Take 50 mg by mouth 2 (two) times daily.    Historical Provider, MD  ibuprofen (ADVIL,MOTRIN) 800 MG tablet Take 1 tablet (800 mg total) by mouth 3 (three) times daily. 07/13/14   Dahlia Bailiff, PA-C  methadone (DOLOPHINE) 10 MG/5ML solution Take 130 mg by mouth daily.    Historical Provider, MD  ondansetron (ZOFRAN) 4 MG tablet Take 1 tablet (4 mg total) by mouth every 6 (six) hours. 07/13/14   Dahlia Bailiff, PA-C  QUEtiapine (SEROQUEL XR) 300 MG 24 hr tablet Take 300 mg by mouth at bedtime.    Historical Provider, MD  sertraline (ZOLOFT) 100 MG tablet Take 100 mg by  mouth daily.    Historical Provider, MD   Meds Ordered and Administered this Visit  Medications - No data to display  BP 113/66 (BP Location: Left Arm)   Pulse 79   Temp 98.4 F (36.9 C) (Oral)   Resp 18   SpO2 100%  No data found.   Physical Exam  Constitutional: She is oriented to person, place, and time. She appears well-developed and well-nourished. No distress.  Genitourinary:     Neurological: She is alert and oriented to person, place, and time.  Skin: Skin is warm and dry. Capillary refill takes less than 2 seconds. She is not diaphoretic.     Psychiatric: She has a normal mood and affect.  Nursing note and vitals reviewed.   Urgent Care Course   Clinical Course     Procedures (including critical care time)  Labs Review Labs  Reviewed - No data to display  Imaging Review No results found.   Visual Acuity Review  Right Eye Distance:   Left Eye Distance:   Bilateral Distance:    Right Eye Near:   Left Eye Near:    Bilateral Near:         MDM   1. Abscess of labia   I recommend you go to the emergency room for further evaluation and drainage of your abscess. Based on the erythema extended up your stomach, I think you will need more evaluation than can be provided here due to possible cellulitis. Please seek treatment at the ER    Barnet Glasgow, NP 04/12/16 2000
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
## 2016-04-12 NOTE — ED Provider Notes (Signed)
Medical screening examination/treatment/procedure(s) were conducted as a shared visit with non-physician practitioner(s) and myself.  I personally evaluated the patient during the encounter.  Abscess to left labia with significant surrounding cellulitis. On my exam she is tender all over. Has some draining purulence. VS WNL, so no e/o sepsis. Plan for ct and if not too deep or extensive, would consider small incision and continued warm compresses with anbitiobics. If any e/o sq gas or deeper structures involved would recommend admission with IV abx and gyn consult.    Merrily Pew, MD 04/16/16 [phone]

## 2016-04-12 NOTE — ED Triage Notes (Signed)
History of abscess.  Reports this abscess is on labia.  Initially a bump, now much larger, no drainage.

## 2016-04-12 NOTE — Discharge Instructions (Signed)
I recommend you go to the emergency room for further evaluation and drainage of your abscess. Based on the erythema extended up your stomach, I think you will need more evaluation than can be provided here. Please seek treatment at the ER

## 2016-04-12 NOTE — ED Notes (Signed)
Patient transported to CT 

## 2016-04-12 NOTE — ED Triage Notes (Signed)
Pt presents to ED for assessment of an abscess from her labia.  Pt sts small amount of yellow drainage.  Sts redness and inflammation has spread x 2 days.  Denies fever or chills at home.

## 2016-04-13 ENCOUNTER — Encounter (HOSPITAL_COMMUNITY): Payer: Self-pay | Admitting: Radiology

## 2016-04-13 ENCOUNTER — Emergency Department (HOSPITAL_COMMUNITY): Payer: Self-pay

## 2016-04-13 IMAGING — CT CT PELVIS W/ CM
2 of 3 series · 17 of 46 positions shown, 19 images · IV contrast (APPLIED)
Comparison: CT of the abdomen and pelvis performed [DATE]

CLINICAL DATA: Follow-up cellulitis along the perineum. Initial
encounter.

EXAM:
CT PELVIS WITH CONTRAST
TECHNIQUE: Multidetector CT imaging of the pelvis was performed using the
standard protocol following the bolus administration of intravenous
contrast.
CONTRAST:  100 mL of Isovue 300 IV contrast

[Series 2: abd/ pelvis 5.0 i30f 1 · axial · 0.77mm/px · z∈[+669,+919]mm · 14 of 58 slices shown, 16 images]
[im 4/58  soft-tissue]
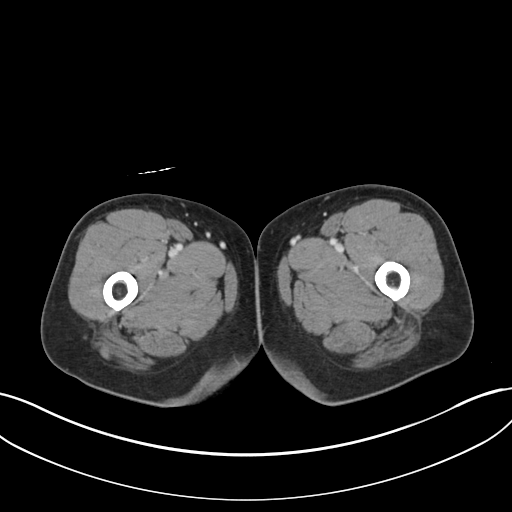
[im 4/58  bone]
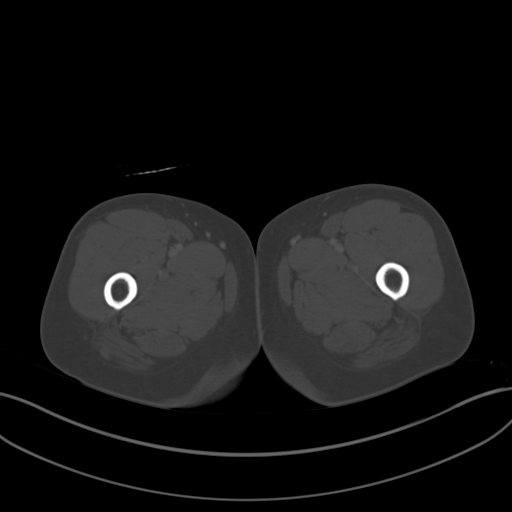
[im 8/58  soft-tissue]
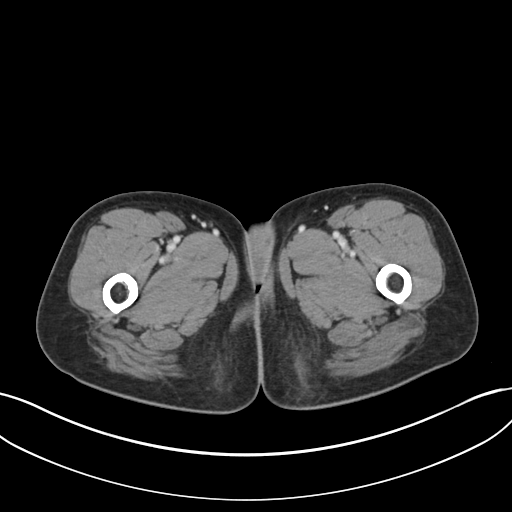
[im 12/58  soft-tissue]
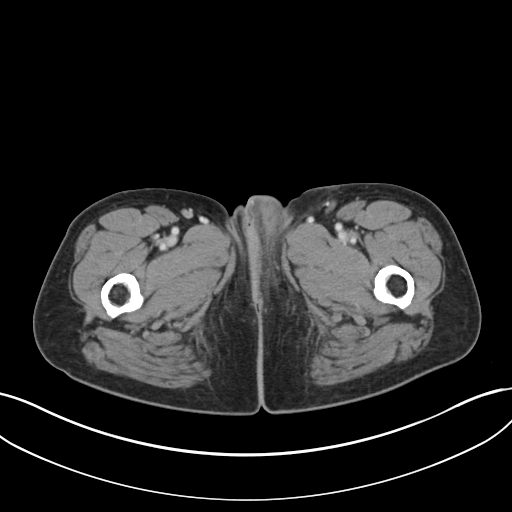
[im 15/58  soft-tissue]
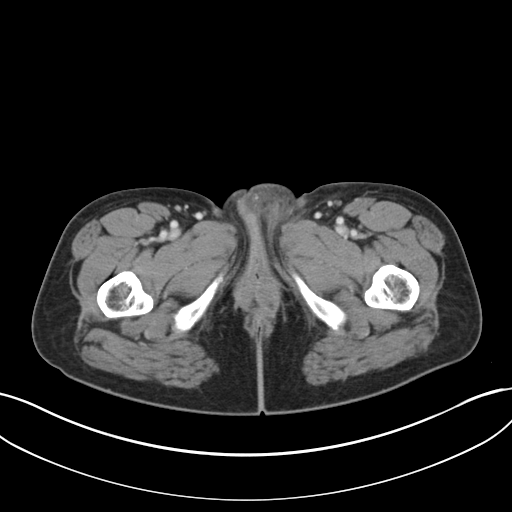
[im 19/58  soft-tissue]
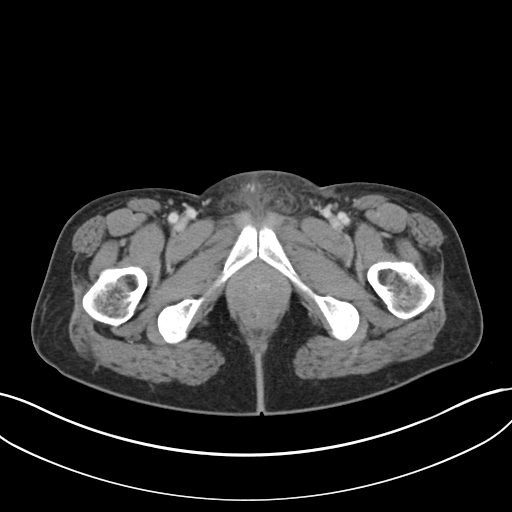
[im 23/58  soft-tissue]
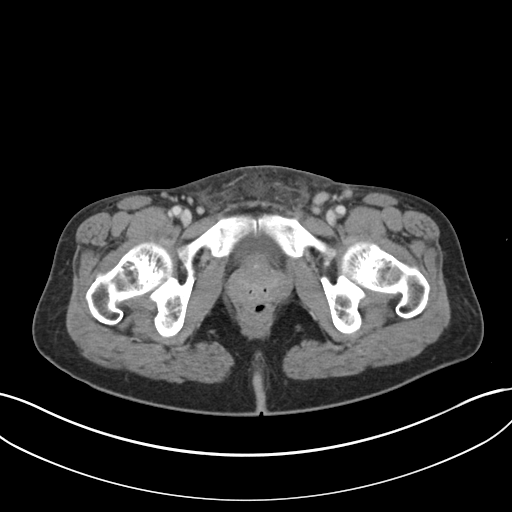
[im 26/58  soft-tissue]
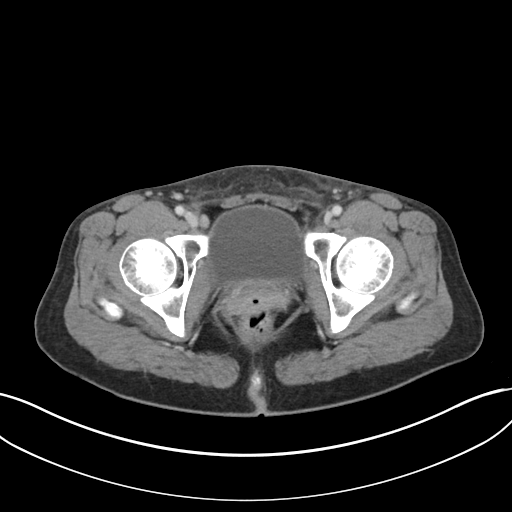
[im 32/58  soft-tissue]
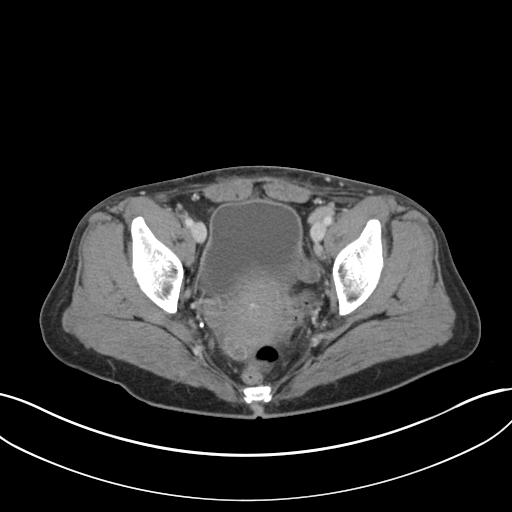
[im 35/58  soft-tissue]
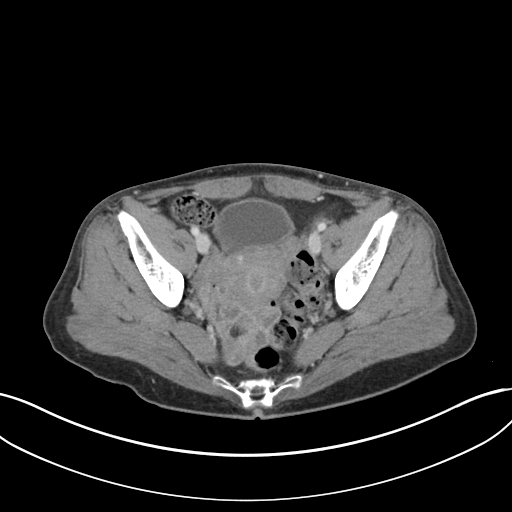
[im 35/58  bone]
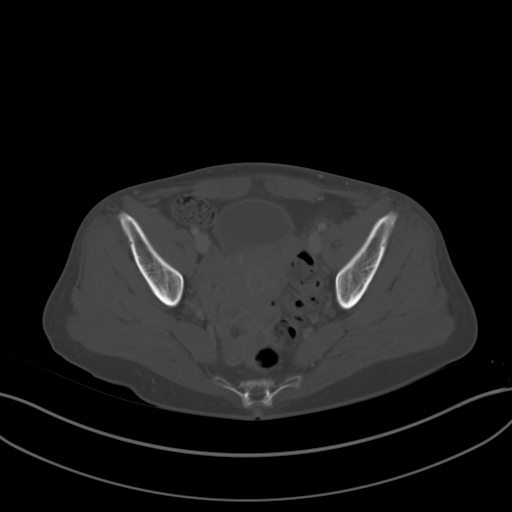
[im 39/58  soft-tissue]
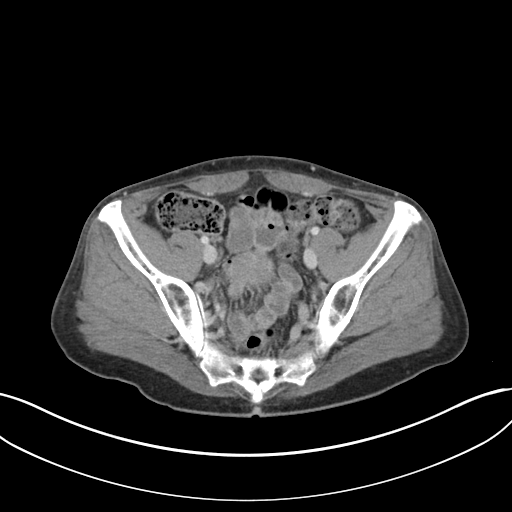
[im 43/58  soft-tissue]
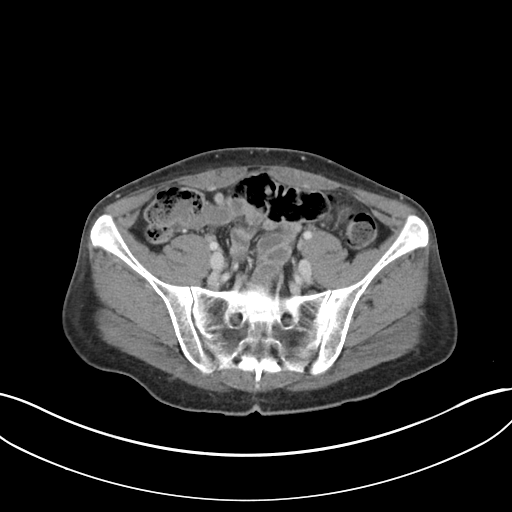
[im 46/58  soft-tissue]
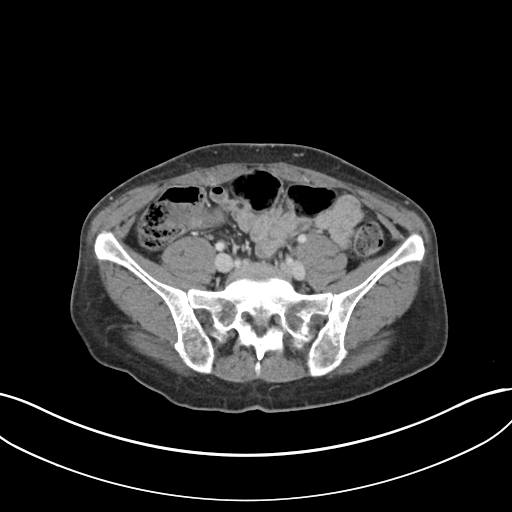
[im 50/58  soft-tissue]
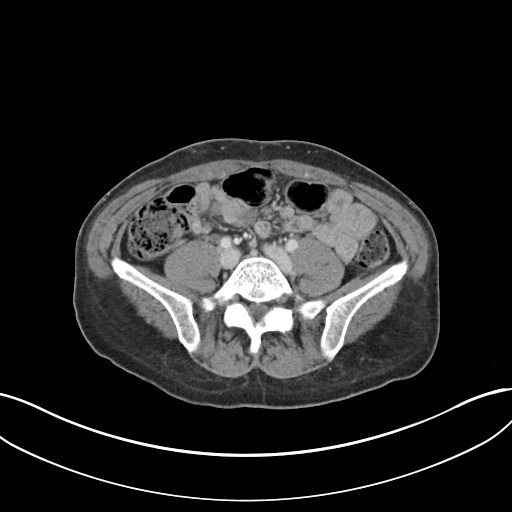
[im 54/58  soft-tissue]
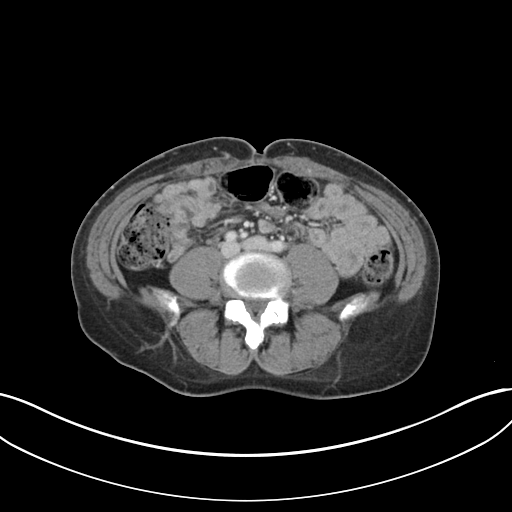

[Series 4: coronal soft tissue · coronal · 0.57mm/px · 3 of 67 slices shown]
[im 23/67  soft-tissue]
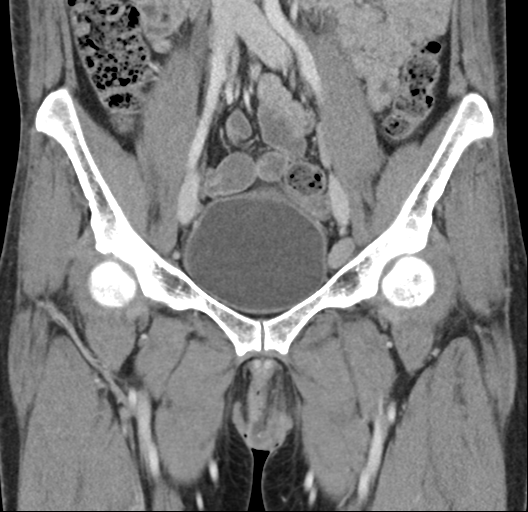
[im 30/67  soft-tissue]
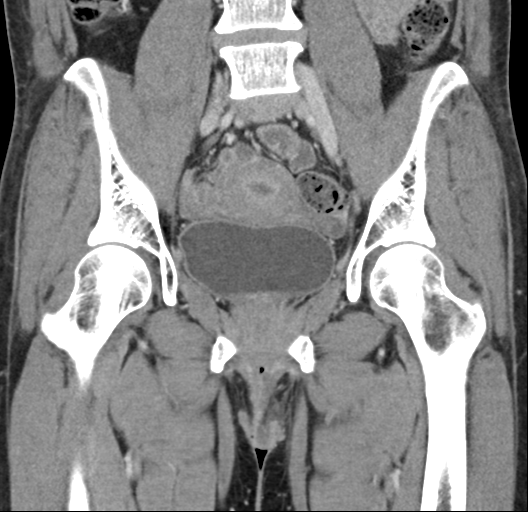
[im 37/67  soft-tissue]
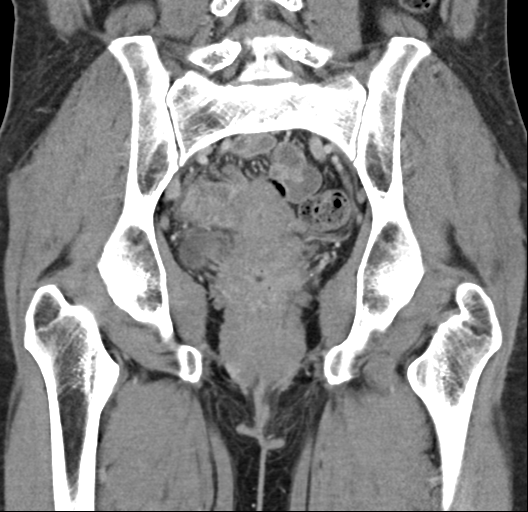

[17 of 46 positions shown; findings below may reference images not displayed]

FINDINGS: Urinary Tract: The bladder is mildly distended and grossly
unremarkable.

Bowel: Visualized small and large bowel loops are grossly
unremarkable. The appendix is normal in caliber, without evidence of
appendicitis.

Vascular/Lymphatic: Visualized vascular structures are grossly
unremarkable. No retroperitoneal or pelvic sidewall lymphadenopathy
is seen.

Reproductive: The uterus is grossly unremarkable in appearance. The
ovaries are relatively symmetric. No suspicious adnexal masses are
seen.

Other: Soft tissue inflammation is seen overlying the left labia,
and along the anterior soft tissues of the pelvis. No drainable
fluid collection is seen to suggest abscess.

Musculoskeletal: No acute osseous abnormalities are identified. The
visualized musculature is unremarkable in appearance.
IMPRESSION: Soft tissue inflammation overlying the left labia, and along the
anterior soft tissues of the pelvis. No evidence of abscess at this
time.

## 2016-04-13 MED ORDER — HYDROCODONE-ACETAMINOPHEN 5-325 MG PO TABS: 1.0000 | ORAL_TABLET | Freq: Four times a day (QID) | ORAL | 0 refills | Status: DC | PRN

## 2016-04-13 MED ORDER — OXYCODONE-ACETAMINOPHEN 5-325 MG PO TABS
1.0000 | ORAL_TABLET | Freq: Once | ORAL | Status: AC
  Administered 2016-04-13: 1 via ORAL
  Filled 2016-04-13: qty 1

## 2016-04-13 MED ORDER — CLINDAMYCIN HCL 150 MG PO CAPS: 300.0000 mg | ORAL_CAPSULE | Freq: Three times a day (TID) | ORAL | 0 refills | Status: DC

## 2016-04-13 NOTE — Discharge Instructions (Signed)
Take ibuprofen for pain. Norco for severe pain only. Take clindamycin as prescribed until all gone. Soak in soapy warm baths every few hours. Follow-up in 2 days for recheck. Return sooner if rapidly worsening or if develop fever, vomiting, any new concerning symptoms.

## 2016-04-13 NOTE — ED Notes (Signed)
ED Provider at bedside. 

## 2016-12-24 ENCOUNTER — Encounter (HOSPITAL_COMMUNITY): Payer: Self-pay | Admitting: Emergency Medicine

## 2016-12-24 ENCOUNTER — Ambulatory Visit (HOSPITAL_COMMUNITY)
Admission: EM | Admit: 2016-12-24 | Discharge: 2016-12-24 | Disposition: A | Discharge status: Home / Self Care | Payer: Self-pay | Attending: Family Medicine | Admitting: Family Medicine

## 2016-12-24 DIAGNOSIS — H8110 Benign paroxysmal vertigo, unspecified ear: Secondary | ICD-10-CM

## 2016-12-24 MED ORDER — ONDANSETRON 4 MG PO TBDP
ORAL_TABLET | ORAL | Status: AC
  Filled 2016-12-24: qty 1

## 2016-12-24 MED ORDER — ONDANSETRON 4 MG PO TBDP
4.0000 mg | ORAL_TABLET | Freq: Once | ORAL | Status: AC
  Administered 2016-12-24: 4 mg via ORAL

## 2016-12-24 MED ORDER — MECLIZINE HCL 25 MG PO TABS: 25.0000 mg | ORAL_TABLET | Freq: Three times a day (TID) | ORAL | 0 refills | Status: DC | PRN

## 2016-12-24 MED ORDER — ONDANSETRON 4 MG PO TBDP: 4.0000 mg | ORAL_TABLET | Freq: Three times a day (TID) | ORAL | 0 refills | Status: DC | PRN

## 2016-12-24 MED FILL — MECLIZINE 25 MG TABLET: 25 | 10 days supply | Qty: 30 | Fill #0

## 2016-12-24 MED FILL — ONDANSETRON ODT 4 MG TABLET: 4 | 5 days supply | Qty: 15 | Fill #0

## 2016-12-24 NOTE — ED Provider Notes (Signed)
[place]   [id number] 12/24/16 Arrival Time: [place] PLAN:  1. Benign paroxysmal positional vertigo, unspecified laterality     Meds ordered this encounter  Medications  . ondansetron (ZOFRAN-ODT) disintegrating tablet 4 mg  . meclizine (ANTIVERT) 25 MG tablet    Sig: Take 1 tablet (25 mg total) by mouth 3 (three) times daily as needed for dizziness.    Dispense:  30 tablet    Refill:  0  . ondansetron (ZOFRAN-ODT) 4 MG disintegrating tablet    Sig: Take 1 tablet (4 mg total) by mouth every 8 (eight) hours as needed for nausea or vomiting.    Dispense:  15 tablet    Refill:  0   Will f/u if not improving over the next 72 hours. Work note given. Ensure adequate fluid intake. Reviewed expectations re: course of current medical issues. Questions answered. Outlined signs and symptoms indicating need for more acute intervention. Patient verbalized understanding. After Visit Summary given.   SUBJECTIVE:  Linda Cervantes is a 51 y.o. female who presents with complaint of vertigo. H/O similar. Last episode approx 1 month ago. This episode started 2-3 days ago. Persistent vertigo when she moves her head. Resolves when she is still. Nausea with one episode of emesis. No HA or visual changes. No ear pain or hearing changes. No recent illnesses. No new medications.  ROS: As per HPI.   OBJECTIVE:  Vitals:   12/24/16 1022 12/24/16 1023  BP:  (!) 145/92  Pulse:  80  Resp:  16  Temp:  98 F (36.7 C)  TempSrc:  Oral  SpO2:  100%  Weight: 130 lb (59 kg)   Height: 5\' 3"  (1.6 m)     General appearance: alert; no distress Eyes: PERRLA; EOMI; conjunctiva normal HENT: TMs normal; nasal mucosa normal; oral mucosa normal Neck: supple Lungs: clear to auscultation bilaterally Heart: regular rate and rhythm Extremities: no cyanosis or edema; symmetrical with no gross deformities Skin: warm and dry Neurologic: normal symmetric reflexes Psychological: alert and  cooperative; normal mood and affect   Allergies  Allergen Reactions  . Amitriptyline Hcl Other (See Comments)    Face Swelling    Past Medical History:  Diagnosis Date  . Anxiety   . Asthma   . Back pain   . Depression   . Endometriosis   . GERD (gastroesophageal reflux disease)   . Heart murmur   . 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 Substance abuse Clean as of 2009   Crack cocaine   Social History   Social History  . Marital status: Married    Spouse name: [name]  . Number of children: [name]  . Years of education: [name]   Occupational History  . Not on file.   Social History Main Topics  . Smoking status: Current Every Day Smoker    Packs/day: 1.00    Types: Cigarettes  . Smokeless tobacco: Former Systems developer  . Alcohol use No  . Drug use: No     Comment: PAST USE  . Sexual activity: Yes    Birth control/ protection: Surgical     Comment: TUBAL LIGATION   Other Topics Concern  . Not on file   Social History Narrative  . No narrative on file   Family History  Problem Relation Age of Onset  . Hypertension Father   . Diabetes Maternal Grandmother    Past Surgical History:  Procedure Laterality Date  . CESAREAN SECTION    . FOOT  SURGERY    . TUBAL LIGATION  2000     Vanessa Kick, MD 12/24/16 1106

## 2016-12-24 NOTE — ED Triage Notes (Signed)
PT reports intermittent dizziness for 1 month. PT reports it's been bad since Saturday. Worse with movement. Feels like room is spinning.  PT also reports dental abscess.

## 2017-03-20 ENCOUNTER — Encounter: Payer: Self-pay | Admitting: Family Medicine

## 2017-03-20 ENCOUNTER — Ambulatory Visit (INDEPENDENT_AMBULATORY_CARE_PROVIDER_SITE_OTHER): Payer: Self-pay | Admitting: Family Medicine

## 2017-03-20 VITALS — BP 122/84 | HR 80 | Temp 98.8°F | Resp 16 | Ht 63.0 in | Wt 128.8 lb

## 2017-03-20 DIAGNOSIS — Z1231 Encounter for screening mammogram for malignant neoplasm of breast: Secondary | ICD-10-CM

## 2017-03-20 DIAGNOSIS — Z1159 Encounter for screening for other viral diseases: Secondary | ICD-10-CM

## 2017-03-20 DIAGNOSIS — Z136 Encounter for screening for cardiovascular disorders: Secondary | ICD-10-CM

## 2017-03-20 DIAGNOSIS — Z9189 Other specified personal risk factors, not elsewhere classified: Secondary | ICD-10-CM

## 2017-03-20 DIAGNOSIS — Z1329 Encounter for screening for other suspected endocrine disorder: Secondary | ICD-10-CM

## 2017-03-20 DIAGNOSIS — F329 Major depressive disorder, single episode, unspecified: Secondary | ICD-10-CM

## 2017-03-20 DIAGNOSIS — Z131 Encounter for screening for diabetes mellitus: Secondary | ICD-10-CM

## 2017-03-20 DIAGNOSIS — Z720 Tobacco use: Secondary | ICD-10-CM

## 2017-03-20 DIAGNOSIS — F419 Anxiety disorder, unspecified: Secondary | ICD-10-CM

## 2017-03-20 LAB — POCT GLYCOSYLATED HEMOGLOBIN (HGB A1C): Hemoglobin A1C: 5.4

## 2017-03-20 MED ORDER — SERTRALINE HCL 50 MG PO TABS: 50.0000 mg | ORAL_TABLET | Freq: Every day | ORAL | 3 refills | Status: DC

## 2017-03-20 MED ORDER — QUETIAPINE FUMARATE 100 MG PO TABS: 100.0000 mg | ORAL_TABLET | Freq: Every day | ORAL | 1 refills | Status: DC

## 2017-03-20 MED ORDER — ALBUTEROL SULFATE HFA 108 (90 BASE) MCG/ACT IN AERS: 2.0000 | INHALATION_SPRAY | Freq: Four times a day (QID) | RESPIRATORY_TRACT | 1 refills | Status: DC | PRN

## 2017-03-20 MED ORDER — GABAPENTIN 300 MG PO CAPS: 300.0000 mg | ORAL_CAPSULE | Freq: Two times a day (BID) | ORAL | 3 refills | Status: DC

## 2017-03-20 MED FILL — GABAPENTIN 300 MG CAPSULE: 300 | 30 days supply | Qty: 60 | Fill #0

## 2017-03-20 MED FILL — !VENTOLIN HFA INHALER: 108 (90 BAS | 25 days supply | Qty: 18 | Fill #0

## 2017-03-20 MED FILL — QUETIAPINE FUMARATE 100 MG: 100 | 30 days supply | Qty: 30 | Fill #0

## 2017-03-20 MED FILL — SERTRALINE HCL 50 MG TABLET: 50 | 30 days supply | Qty: 30 | Fill #0

## 2017-03-20 NOTE — Progress Notes (Signed)
Linda Cervantes, is a 51 y.o. female  QIO:[id number]  LKG:[id number]  DOB - [birth year]  CC:  Chief Complaint  Patient presents with  . Establish Care  . Dizziness  . Dental Pain      HPI: Linda Cervantes is a 51 y.o. female is here today. Medical history significant for substance dependency (heroin), anxiety, and depression. Reports a history of multiple incarcerations related to substance abuse. She is presently in a rehabilitation program in which her dependency is managed with Methadone.    Anxiety and Depression Linda Cervantes reports she has extensive mental history and is currently not receiving behavioral health services as she is uninsured. Symptoms includes "seeing spirits" and "hearing voices calling my name". Reports that the spirits of mostly of relative and people that she has known who had passed away. She acknowledges that both the voices and spirits are friendly and non-frightful. Linda Cervantes was previously treated with seroquel, sertraline, and fluoxetine. She acknowledged a suicide attempt in 2012 after being released from prison. Linda Cervantes is interested in resuming medications today to help with depression and anxiety. She has remained free of heroin use for 4 months and has noticed both anxiety and depression symptoms are more pronounced now that she is no longer abusing drugs. Linda Cervantes denies today any active suicidal ideations or thoughts of harming others.  Dental Pain  Linda Cervantes also complains today of dental pain. No prior dental evaluations. Reports broken teeth and pain with eating. Denies any gum bleeding or dental abscess. She has not Designer, fashion/clothing or payor source.   General health maintenance  Last pap over 1 year ago while incarcerated. Uncertain if PAP results were abnormal . No prior mammogram. Reports negative HIV and HCV testing.   Linda Cervantes denies headache, chest pain, shortness of breath, cough, abdominal pain , nausea, new weakness tingling or numbness.  Allergies  Allergen  Reactions  . Amitriptyline Hcl Other (See Comments)    Face Swelling   Past Medical History:  Diagnosis Date  . Anxiety   . Asthma   . Back pain   . Depression   . Endometriosis   . GERD (gastroesophageal reflux disease)   . Heart murmur   . 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 Substance abuse ([place]) Clean as of 2009   Crack cocaine   Current Outpatient Medications on File Prior to Visit  Medication Sig Dispense Refill  . meclizine (ANTIVERT) 25 MG tablet Take 1 tablet (25 mg total) by mouth 3 (three) times daily as needed for dizziness. 30 tablet 0  . albuterol (PROVENTIL HFA;VENTOLIN HFA) 108 (90 BASE) MCG/ACT inhaler Inhale 2 puffs into the lungs every 6 (six) hours as needed for wheezing (wheezing).      No current facility-administered medications on file prior to visit.    Family History  Problem Relation Age of Onset  . Hypertension Father   . Diabetes Maternal Grandmother    Social History   Socioeconomic History  . Marital status: Married    Spouse name: Not on file  . Number of children: Not on file  . Years of education: Not on file  . Highest education level: Not on file  Social Needs  . Financial resource strain: Not on file  . Food insecurity - worry: Not on file  . Food insecurity - inability: Not on file  . Transportation needs - medical: Not on file  . Transportation needs - non-medical: Not on file  Occupational History  . Not on file  Tobacco Use  .  Smoking status: Current Every Day Smoker    Packs/day: 1.00    Types: Cigarettes  . Smokeless tobacco: Former Network engineer and Sexual Activity  . Alcohol use: No  . Drug use: No    Comment: PAST USE  . Sexual activity: Yes    Birth control/protection: Surgical    Comment: TUBAL LIGATION  Other Topics Concern  . Not on file  Social History Narrative  . Not on file    Objective:   Vitals:   03/20/17 1412  BP: 122/84  Pulse: 80  Resp: 16  Temp: 98.8 F (37.1 C)  SpO2:  100%   Physical Exam  Constitutional: She is oriented to person, place, and time and well-developed, well-nourished, and in no distress.  HENT:  Head: Normocephalic and atraumatic.  Right Ear: External ear normal.  Left Ear: External ear normal.  Nose: Nose normal.  Mouth/Throat: Oropharynx is clear and moist.  Eyes: Conjunctivae and EOM are normal. Pupils are equal, round, and reactive to light.  Neck: Normal range of motion. Neck supple. No thyromegaly present.  Cardiovascular: Normal rate, regular rhythm and normal heart sounds.  Pulmonary/Chest: Effort normal and breath sounds normal.  Abdominal: Soft. Bowel sounds are normal.  Musculoskeletal: Normal range of motion.  Lymphadenopathy:    She has no cervical adenopathy.  Neurological: She is alert and oriented to person, place, and time. Gait normal. GCS score is 15.  Skin: Skin is warm and dry.  Psychiatric: Mood, memory, affect and judgment normal.   Lab Results  Component Value Date   WBC 10.4 04/12/2016   HGB 12.2 04/12/2016   HCT 36.5 04/12/2016   MCV 83.9 04/12/2016   PLT 196 04/12/2016   Lab Results  Component Value Date   CREATININE 0.65 04/12/2016   BUN 7 04/12/2016   NA 138 04/12/2016   K 4.2 04/12/2016   CL 106 04/12/2016   CO2 24 04/12/2016    Lab Results  Component Value Date   HGBA1C 5.4 03/20/2017   Lipid Panel     Component Value Date/Time   CHOL 209 (H) 10/03/2010 1457   TRIG 152 (H) 10/03/2010 1457   HDL 44 10/03/2010 1457   CHOLHDL 4.8 10/03/2010 1457   VLDL 30 10/03/2010 1457   LDLCALC 135 (H) 10/03/2010 1457        Assessment and plan:  1. Anxiety and depression, currently active, chronic. Based on symptoms, suspect patient likely suffers from bipolar or undifferentiated schizoaffective disorder. Today, I will resume seroquel and sertraline. Adding gabapentin as mood stabilizer. Recommending outpatient follow-up with [facility]  2. Encounter for hepatitis C  virus screening test for high risk patient, hx of substance abuse and incarcerations.  Obtaining HCV Ab w/Rflx to Verification  3. Breast cancer screening by mammogram- MM Digital Screening; Future  4. Screening for diabetes mellitus, A1c 5.4, normal, repeat in 12 months.  5. Screening for thyroid disorder- Thyroid Panel With TSH  6. Encounter for screening for cardiovascular disorders, EKG interpertation NSR, with prolonged QT. Patient is asymptomatic. Avoid drugs that prolong QT interval to reduce the risk of torsade.   7. Tobacco abuse, counseled regarding smoke cessation. Patient is not ready to quit as she is in rehabilitation for treatment of substance abuse problem. Will revisit cessation at subsequent visits.    Return in about 2 weeks (around 04/03/2017), or PAP and Depression/anxiety .  The patient was given clear instructions to go to ER or return to medical center if symptoms  don't improve, worsen or new problems develop. The patient verbalized understanding. The patient was told to call to get lab results if they haven't heard anything in the next week.     Carroll Sage. Kenton Kingfisher, MSN, FNP-C [facility] [place] [place], [place] [address] [phone]  A total of 45 minutes spent, greater than 50 % of this time was spent obtaining prior medical history, discuss current symptomology related to active mental health illness, reviewing medications and indications of treatment, prior labs and diagnostic tests, discussing current plan of treatment, health promotion, and goals of treatment.  This note has been created with Surveyor, quantity. Any transcriptional errors are unintentional.

## 2017-03-20 NOTE — Patient Instructions (Addendum)
To schedule your breast exam, please call [facility] at   Phone: [phone]  [facility] dental [phone]

## 2017-03-21 LAB — POCT URINALYSIS DIP (DEVICE)
BILIRUBIN URINE: NEGATIVE
Glucose, UA: NEGATIVE mg/dL
Ketones, ur: NEGATIVE mg/dL
LEUKOCYTES UA: NEGATIVE
Nitrite: NEGATIVE
PH: 6 (ref 5.0–8.0)
Protein, ur: NEGATIVE mg/dL
Urobilinogen, UA: 0.2 mg/dL (ref 0.0–1.0)

## 2017-03-29 ENCOUNTER — Telehealth: Payer: Self-pay | Admitting: Family Medicine

## 2017-03-29 DIAGNOSIS — B182 Chronic viral hepatitis C: Secondary | ICD-10-CM

## 2017-03-29 LAB — COMPREHENSIVE METABOLIC PANEL
ALT: 16 IU/L (ref 0–32)
AST: 25 IU/L (ref 0–40)
Albumin/Globulin Ratio: 1.6 (ref 1.2–2.2)
Albumin: 4.6 g/dL (ref 3.5–5.5)
Alkaline Phosphatase: 72 IU/L (ref 39–117)
BUN/Creatinine Ratio: 10 (ref 9–23)
BUN: 10 mg/dL (ref 6–24)
Bilirubin Total: 0.2 mg/dL (ref 0.0–1.2)
CALCIUM: 9.6 mg/dL (ref 8.7–10.2)
CO2: 25 mmol/L (ref 20–29)
Chloride: 100 mmol/L (ref 96–106)
Creatinine, Ser: 1.03 mg/dL — ABNORMAL HIGH (ref 0.57–1.00)
GFR calc Af Amer: 74 mL/min/{1.73_m2} (ref >59)
GFR, EST NON AFRICAN AMERICAN: 64 mL/min/{1.73_m2} (ref >59)
Globulin, Total: 2.9 g/dL (ref 1.5–4.5)
Glucose: 100 mg/dL — ABNORMAL HIGH (ref 65–99)
Potassium: 4.3 mmol/L (ref 3.5–5.2)
Sodium: 141 mmol/L (ref 134–144)
Total Protein: 7.5 g/dL (ref 6.0–8.5)

## 2017-03-29 LAB — THYROID PANEL WITH TSH
Free Thyroxine Index: 2 (ref 1.2–4.9)
T3 Uptake Ratio: 21 % — ABNORMAL LOW (ref 24–39)
T4, Total: 9.4 ug/dL (ref 4.5–12.0)
TSH: 1.21 u[IU]/mL (ref 0.450–4.500)

## 2017-03-29 LAB — CBC WITH DIFFERENTIAL/PLATELET
BASOS: 0 %
Basophils Absolute: 0 10*3/uL (ref 0.0–0.2)
EOS (ABSOLUTE): 0.2 10*3/uL (ref 0.0–0.4)
EOS: 2 %
Hematocrit: 41.8 % (ref 34.0–46.6)
Hemoglobin: 14.1 g/dL (ref 11.1–15.9)
IMMATURE GRANULOCYTES: 0 %
Immature Grans (Abs): 0 10*3/uL (ref 0.0–0.1)
Lymphocytes Absolute: 3.7 10*3/uL — ABNORMAL HIGH (ref 0.7–3.1)
Lymphs: 47 %
MCH: 28.3 pg (ref 26.6–33.0)
MCHC: 33.7 g/dL (ref 31.5–35.7)
MCV: 84 fL (ref 79–97)
MONOS ABS: 0.5 10*3/uL (ref 0.1–0.9)
Monocytes: 7 %
NEUTROS ABS: 3.5 10*3/uL (ref 1.4–7.0)
Neutrophils: 44 %
Platelets: 216 10*3/uL (ref 150–379)
RBC: 4.99 x10E6/uL (ref 3.77–5.28)
RDW: 14.1 % (ref 12.3–15.4)
WBC: 8 10*3/uL (ref 3.4–10.8)

## 2017-03-29 LAB — HCV RNA NAA QUALITATIVE: HCV RNA NAA QUALITATIVE: POSITIVE — AB

## 2017-03-29 LAB — INTERPRETATION:

## 2017-03-29 LAB — HCV AB W/RFLX TO VERIFICATION: HCV Ab: >11 s/co ratio — ABNORMAL HIGH (ref 0.0–0.9)

## 2017-03-29 NOTE — Telephone Encounter (Signed)
Carrie-please attempt to reach patient:  Left a voicemail ( 03/29/2017) for patient to contact the office regarding her most recent lab results which indicate an active hepatitis C infection which was confirmed with a reflex testing. According to our records she test positive previously 6 years ago. I a referring patient to infectious disease.  Morey Hummingbird, advise patient to move forward with completing the [place] application to assist with cost of care.  Carroll Sage. Kenton Kingfisher, MSN, FNP-C [facility] [place]  [address] [place], [place] [address] [phone]

## 2017-04-03 ENCOUNTER — Other Ambulatory Visit: Payer: Self-pay | Admitting: Family Medicine

## 2017-04-03 DIAGNOSIS — Z1231 Encounter for screening mammogram for malignant neoplasm of breast: Secondary | ICD-10-CM

## 2017-04-03 NOTE — Telephone Encounter (Signed)
Left a vm for patient to callback 

## 2017-04-04 ENCOUNTER — Ambulatory Visit (INDEPENDENT_AMBULATORY_CARE_PROVIDER_SITE_OTHER): Payer: Self-pay | Admitting: Family Medicine

## 2017-04-04 ENCOUNTER — Encounter: Payer: Self-pay | Admitting: Family Medicine

## 2017-04-04 VITALS — BP 126/78 | HR 84 | Temp 98.7°F | Resp 16 | Ht 63.0 in | Wt 130.0 lb

## 2017-04-04 DIAGNOSIS — K0889 Other specified disorders of teeth and supporting structures: Secondary | ICD-10-CM

## 2017-04-04 DIAGNOSIS — K08109 Complete loss of teeth, unspecified cause, unspecified class: Secondary | ICD-10-CM

## 2017-04-04 DIAGNOSIS — Z01419 Encounter for gynecological examination (general) (routine) without abnormal findings: Secondary | ICD-10-CM

## 2017-04-04 NOTE — Patient Instructions (Addendum)
Please follow-up with [facility] for further evaluation of depression/anxiety symptoms. [address], [place], [place] [phone]  I will process your referral [facility]. If you haven't heard from them within 2 weeks, follow-up with there office by calling [phone].  Complete financial assistance application in order to be referred to the hand specialist for numbness and tingling of right hand.   Contact Infectious Disease to schedule follow-up to schedule for Hep C treatment [phone].

## 2017-04-04 NOTE — Telephone Encounter (Signed)
Patient was in office today and notified of results by provider
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
## 2017-04-07 ENCOUNTER — Encounter: Payer: Self-pay | Admitting: Family Medicine

## 2017-04-07 LAB — IGP,CTNGTV,APT HPV,RFX16/18,45
Chlamydia, Nuc. Acid Amp: NEGATIVE
GONOCOCCUS, NUC. ACID AMP: NEGATIVE
HPV Aptima: NEGATIVE
PAP Smear Comment: 0
TRICH VAG BY NAA: NEGATIVE

## 2017-04-08 ENCOUNTER — Ambulatory Visit (INDEPENDENT_AMBULATORY_CARE_PROVIDER_SITE_OTHER): Payer: Self-pay | Admitting: Family Medicine

## 2017-04-08 ENCOUNTER — Encounter: Payer: Self-pay | Admitting: Family Medicine

## 2017-04-08 VITALS — BP 114/66 | HR 68 | Temp 98.7°F | Resp 14 | Ht 63.0 in | Wt 132.0 lb

## 2017-04-08 DIAGNOSIS — R5383 Other fatigue: Secondary | ICD-10-CM

## 2017-04-08 DIAGNOSIS — R768 Other specified abnormal immunological findings in serum: Secondary | ICD-10-CM

## 2017-04-08 DIAGNOSIS — F419 Anxiety disorder, unspecified: Secondary | ICD-10-CM

## 2017-04-08 DIAGNOSIS — H6982 Other specified disorders of Eustachian tube, left ear: Secondary | ICD-10-CM

## 2017-04-08 DIAGNOSIS — F329 Major depressive disorder, single episode, unspecified: Secondary | ICD-10-CM

## 2017-04-08 DIAGNOSIS — H6992 Unspecified Eustachian tube disorder, left ear: Secondary | ICD-10-CM

## 2017-04-08 DIAGNOSIS — F32A Depression, unspecified: Secondary | ICD-10-CM

## 2017-04-08 MED ORDER — PREDNISONE 20 MG PO TABS: 20.0000 mg | ORAL_TABLET | Freq: Every day | ORAL | 0 refills | Status: DC

## 2017-04-08 MED ORDER — CETIRIZINE HCL 10 MG PO TABS: 10.0000 mg | ORAL_TABLET | Freq: Every day | ORAL | 11 refills | Status: DC

## 2017-04-08 MED ORDER — FLUTICASONE PROPIONATE 50 MCG/ACT NA SUSP: 2.0000 | Freq: Every day | NASAL | 6 refills | Status: DC

## 2017-04-08 MED FILL — FLUTICASONE PROP 50 MCG SPR: 50 | 30 days supply | Qty: 16 | Fill #0

## 2017-04-08 MED FILL — predniSONE 20 MG TABS: 20 | 5 days supply | Qty: 5 | Fill #0

## 2017-04-08 MED FILL — ?CETIRIZINE HCL 10 MG TABLE: 10 | 30 days supply | Qty: 30 | Fill #0
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
## 2017-04-08 NOTE — Patient Instructions (Signed)
Eustachian Tube Dysfunction The eustachian tube connects the middle ear to the back of the nose. It regulates air pressure in the middle ear by allowing air to move between the ear and nose. It also helps to drain fluid from the middle ear space. When the eustachian tube does not function properly, air pressure, fluid, or both can build up in the middle ear. Eustachian tube dysfunction can affect one or both ears. What are the causes? This condition happens when the eustachian tube becomes blocked or cannot open normally. This may result from:  Ear infections.  Colds and other upper respiratory infections.  Allergies.  Irritation, such as from cigarette smoke or acid from the stomach coming up into the esophagus (gastroesophageal reflux).  Sudden changes in air pressure, such as from descending in an airplane.  Abnormal growths in the nose or throat, such as nasal polyps, tumors, or enlarged tissue at the back of the throat (adenoids).  What increases the risk? This condition may be more likely to develop in people who smoke and people who are overweight. Eustachian tube dysfunction may also be more likely to develop in children, especially children who have:  Certain birth defects of the mouth, such as cleft palate.  Large tonsils and adenoids.  What are the signs or symptoms? Symptoms of this condition may include:  A feeling of fullness in the ear.  Ear pain.  Clicking or popping noises in the ear.  Ringing in the ear.  Hearing loss.  Loss of balance.  Symptoms may get worse when the air pressure around you changes, such as when you travel to an area of high elevation or fly on an airplane. How is this diagnosed? This condition may be diagnosed based on:  Your symptoms.  A physical exam of your ear, nose, and throat.  Tests, such as those that measure: ? The movement of your eardrum (tympanogram). ? Your hearing (audiometry).  How is this treated? Treatment  depends on the cause and severity of your condition. If your symptoms are mild, you may be able to relieve your symptoms by moving air into ("popping") your ears. If you have symptoms of fluid in your ears, treatment may include:  Decongestants.  Antihistamines.  Nasal sprays or ear drops that contain medicines that reduce swelling (steroids).  In some cases, you may need to have a procedure to drain the fluid in your eardrum (myringotomy). In this procedure, a small tube is placed in the eardrum to:  Drain the fluid.  Restore the air in the middle ear space.  Follow these instructions at home:  Take over-the-counter and prescription medicines only as told by your health care provider.  Use techniques to help pop your ears as recommended by your health care provider. These may include: ? Chewing gum. ? Yawning. ? Frequent, forceful swallowing. ? Closing your mouth, holding your nose closed, and gently blowing as if you are trying to blow air out of your nose.  Do not do any of the following until your health care provider approves: ? Travel to high altitudes. ? Fly in airplanes. ? Work in a pressurized cabin or room. ? Scuba dive.  Keep your ears dry. Dry your ears completely after showering or bathing.  Do not smoke.  Keep all follow-up visits as told by your health care provider. This is important. Contact a health care provider if:  Your symptoms do not go away after treatment.  Your symptoms come back after treatment.  You are   unable to pop your ears.  You have: ? A fever. ? Pain in your ear. ? Pain in your head or neck. ? Fluid draining from your ear.  Your hearing suddenly changes.  You become very dizzy.  You lose your balance. This information is not intended to replace advice given to you by your health care provider. Make sure you discuss any questions you have with your health care provider. Document Released: 04/15/2015 Document Revised: 08/25/2015  Document Reviewed: 04/07/2014 Elsevier Interactive Patient Education  2018 [employer]vier Inc[employer]  

## 2017-05-01 MED FILL — AMOXICILLIN 875 MG TABLET: 875 | 7 days supply | Qty: 14 | Fill #0

## 2017-05-08 MED FILL — ?CETIRIZINE HCL 10 MG TABLE: 10 | 30 days supply | Qty: 30 | Fill #1

## 2017-05-08 MED FILL — QUETIAPINE FUMARATE 100 MG: 100 | 30 days supply | Qty: 30 | Fill #1

## 2017-05-08 MED FILL — GABAPENTIN 300 MG CAPSULE: 300 | 30 days supply | Qty: 60 | Fill #1

## 2017-05-08 MED FILL — FLUTICASONE PROP 50 MCG SPR: 50 | 30 days supply | Qty: 16 | Fill #1

## 2017-05-08 MED FILL — !VENTOLIN HFA INHALER: 108 (90 BAS | 25 days supply | Qty: 18 | Fill #1

## 2017-05-16 ENCOUNTER — Other Ambulatory Visit: Payer: Self-pay

## 2017-05-16 ENCOUNTER — Encounter (HOSPITAL_COMMUNITY): Payer: Self-pay | Admitting: Emergency Medicine

## 2017-05-16 DIAGNOSIS — G5601 Carpal tunnel syndrome, right upper limb: Secondary | ICD-10-CM | POA: Insufficient documentation

## 2017-05-16 DIAGNOSIS — Z79899 Other long term (current) drug therapy: Secondary | ICD-10-CM | POA: Insufficient documentation

## 2017-05-16 DIAGNOSIS — J45909 Unspecified asthma, uncomplicated: Secondary | ICD-10-CM | POA: Insufficient documentation

## 2017-05-16 DIAGNOSIS — F17210 Nicotine dependence, cigarettes, uncomplicated: Secondary | ICD-10-CM | POA: Insufficient documentation

## 2017-05-16 NOTE — ED Triage Notes (Signed)
Patient with right hand swelling and numbness.  She states that this has been going on for about 5 days.  She states that she is dropping things when she goes to pick them up.  She states that the pain is becoming constant and unbearable.  Patient states that she fell about one month ago but nothing recent.  Patient states she has some lumps coming up on her hand.

## 2017-05-17 ENCOUNTER — Emergency Department (HOSPITAL_COMMUNITY): Payer: Self-pay

## 2017-05-17 ENCOUNTER — Emergency Department (HOSPITAL_COMMUNITY)
Admission: EM | Admit: 2017-05-17 | Discharge: 2017-05-17 | Disposition: A | Discharge status: Home / Self Care | Payer: Self-pay | Attending: Emergency Medicine | Admitting: Emergency Medicine

## 2017-05-17 DIAGNOSIS — G5601 Carpal tunnel syndrome, right upper limb: Secondary | ICD-10-CM

## 2017-05-17 IMAGING — DX DG HAND COMPLETE 3+V*R*
3 series · 3 of 3 positions shown · non-contrast
Comparison: None.

CLINICAL DATA: Hand pain, swelling and limited use for 5 days. Fell
1 month ago.

EXAM:
RIGHT HAND - COMPLETE 3+ VIEW

[hand pa]
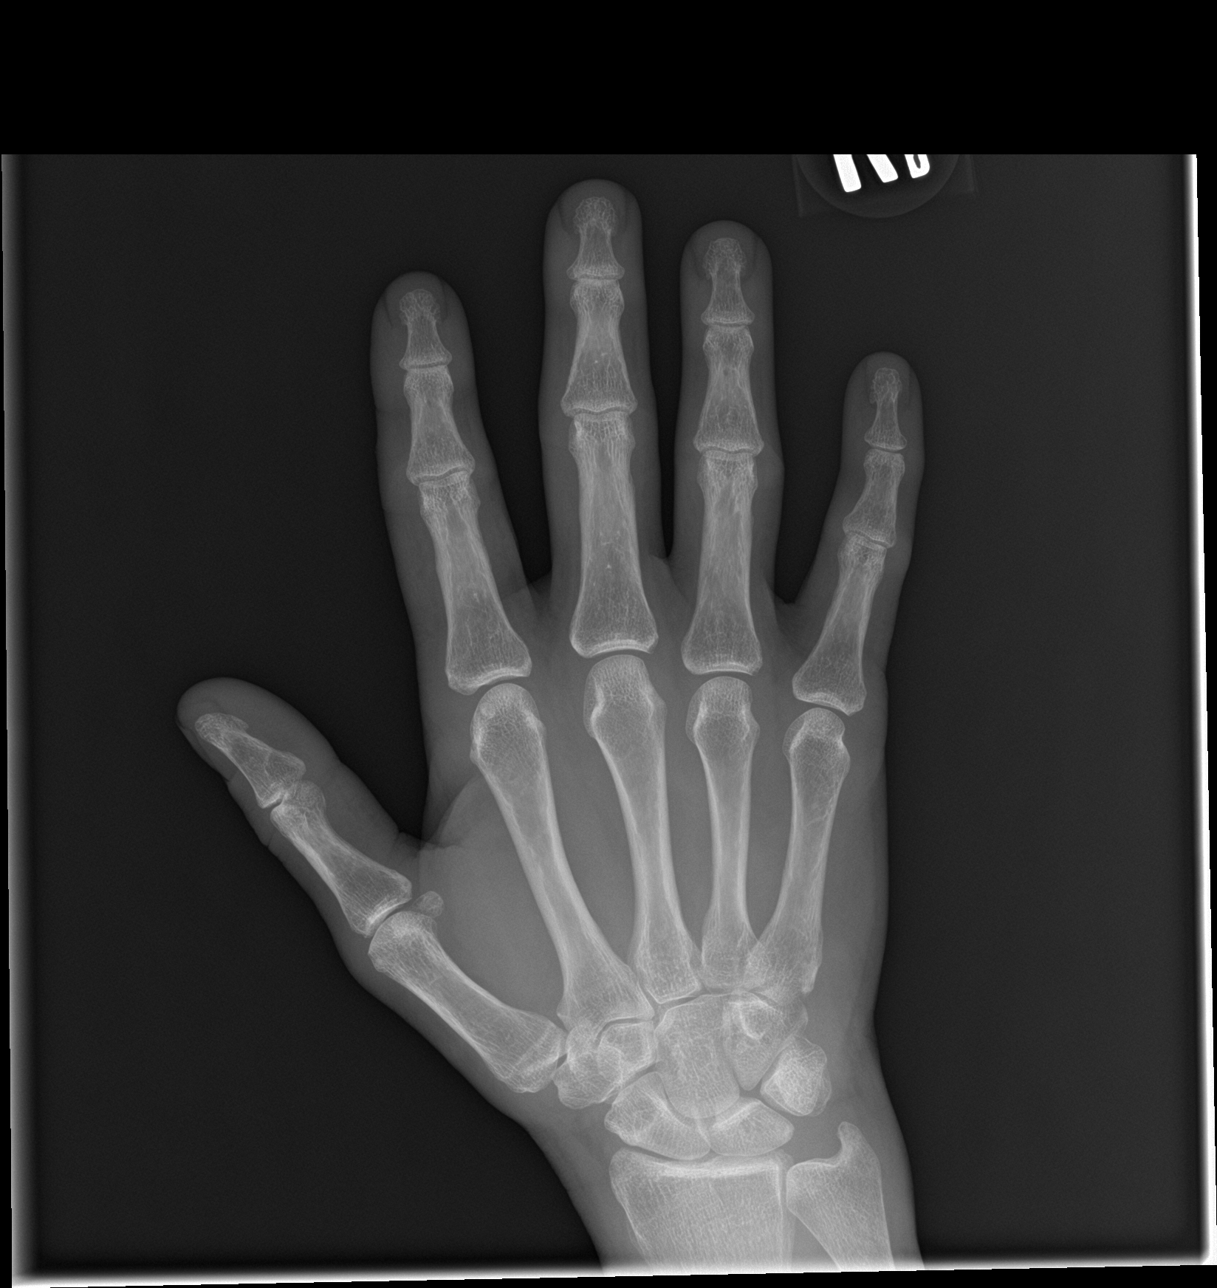

[hand obl]
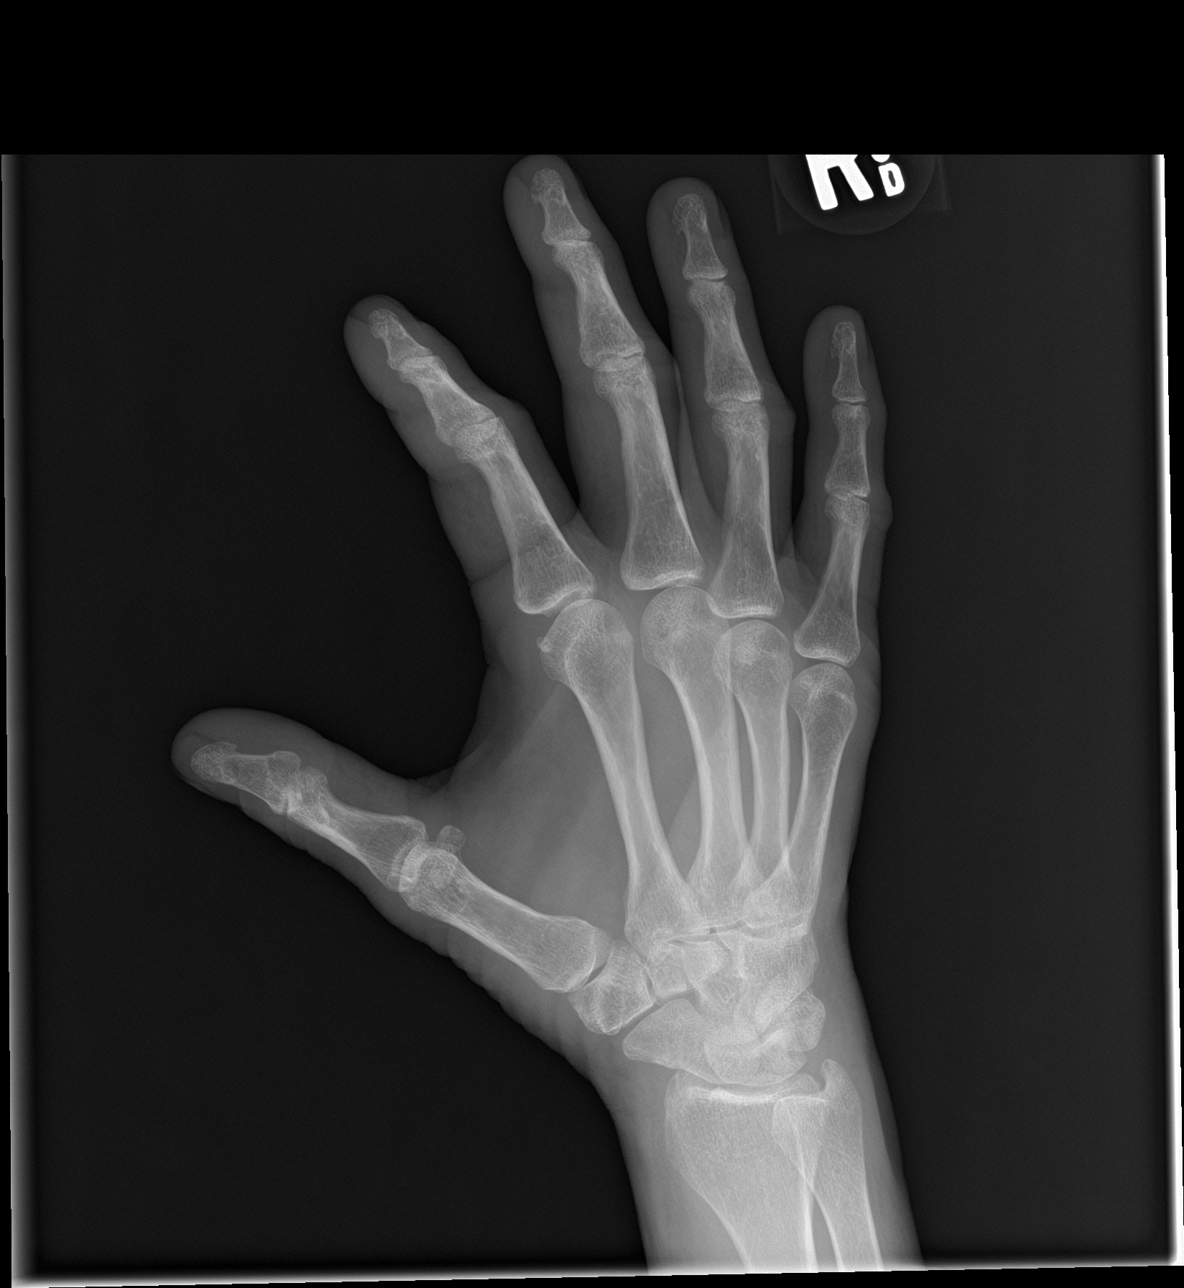

[hand lat]
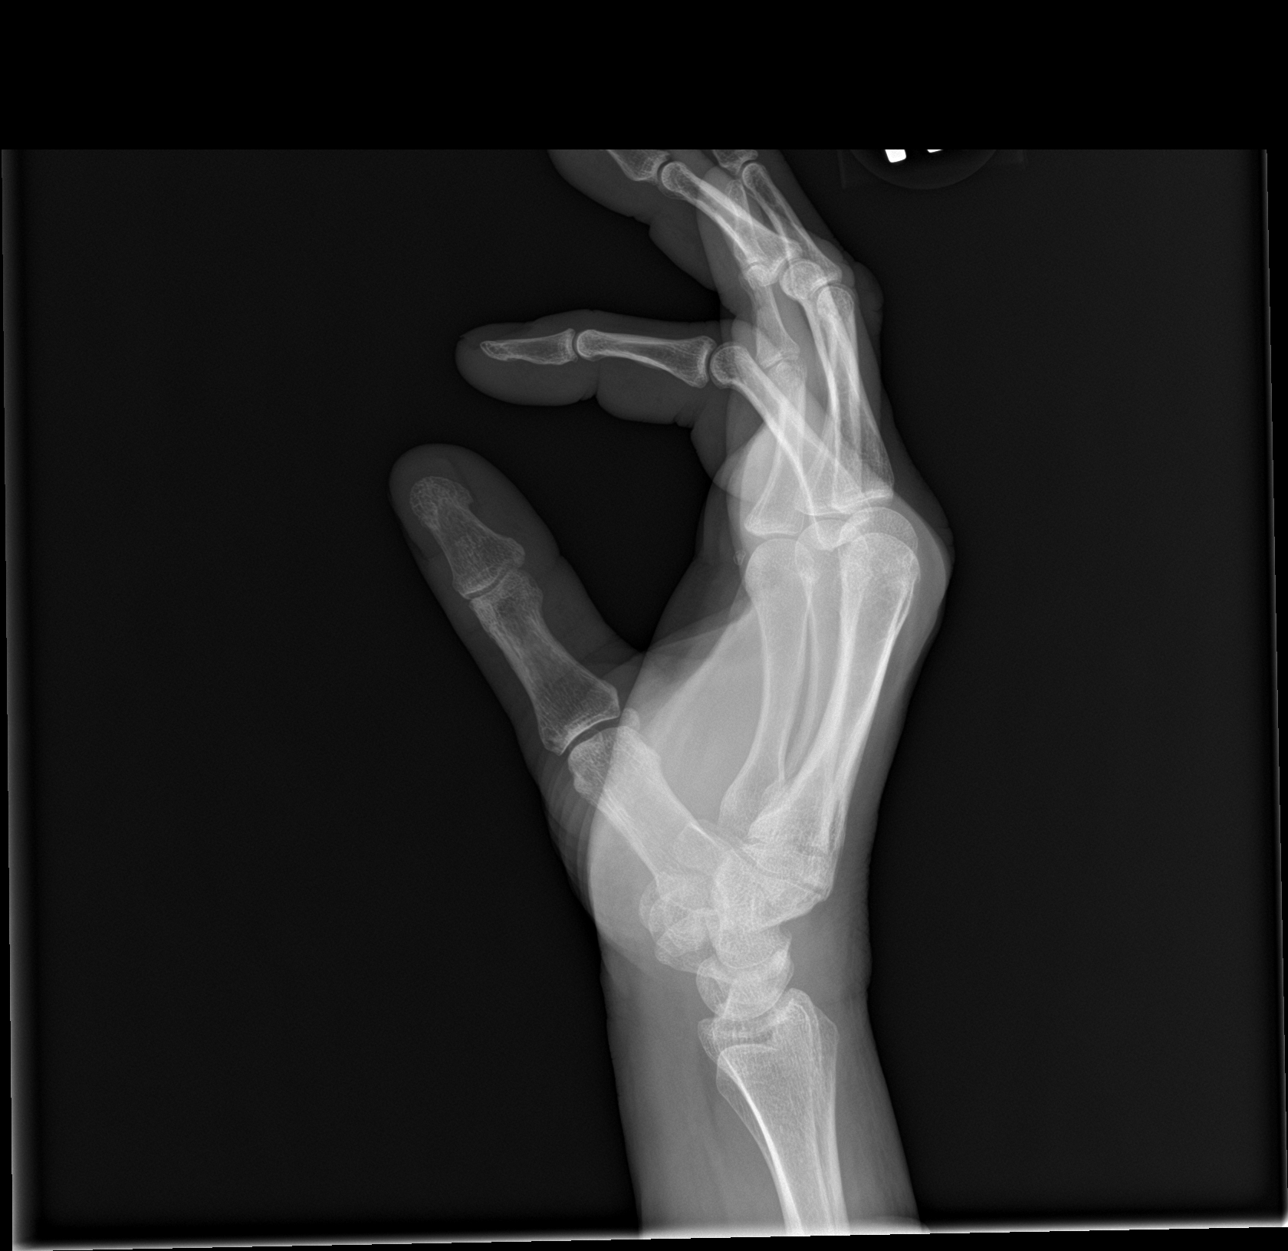

[3 of 3 positions shown; findings below may reference images not displayed]

FINDINGS: There is no evidence of fracture or dislocation. There is no
evidence of arthropathy or other focal bone abnormality. Soft
tissues are unremarkable.
IMPRESSION: Negative.

## 2017-05-17 MED ORDER — MELOXICAM 15 MG PO TABS: 15.0000 mg | ORAL_TABLET | Freq: Every day | ORAL | 0 refills | Status: DC

## 2017-05-17 MED ORDER — HYDROCODONE-ACETAMINOPHEN 5-325 MG PO TABS: 2.0000 | ORAL_TABLET | ORAL | 0 refills | Status: DC | PRN

## 2017-05-17 MED ORDER — HYDROCODONE-ACETAMINOPHEN 5-325 MG PO TABS
1.0000 | ORAL_TABLET | Freq: Once | ORAL | Status: AC
  Administered 2017-05-17: 1 via ORAL
  Filled 2017-05-17: qty 1

## 2017-05-17 MED FILL — MELOXICAM 15 MG TABLET: 15 | 30 days supply | Qty: 30 | Fill #0

## 2017-05-17 NOTE — ED Notes (Signed)
Patient resting in bed comfortably at this time.  No breathing complications noted. Waiting to be seen by ED provider.

## 2017-05-17 NOTE — ED Notes (Signed)
Patient and family sleeping in hallway

## 2017-05-17 NOTE — ED Notes (Signed)
Patient Alert and oriented X4. Stable and ambulatory. Patient verbalized understanding of the discharge instructions.  Patient belongings were taken by the patient.  

## 2017-05-17 NOTE — ED Notes (Signed)
EDP at bedside  
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
## 2017-05-17 NOTE — Discharge Instructions (Signed)
Wear your splint all the time except when bathing.  Apply ice as directed 3-5 times a day. Take the medications as prescribed.  Follow up with the hand specialist

## 2017-05-21 ENCOUNTER — Ambulatory Visit (HOSPITAL_COMMUNITY)
Admission: EM | Admit: 2017-05-21 | Discharge: 2017-05-21 | Disposition: A | Discharge status: Home / Self Care | Payer: Self-pay | Attending: Internal Medicine | Admitting: Internal Medicine

## 2017-05-21 ENCOUNTER — Encounter (HOSPITAL_COMMUNITY): Payer: Self-pay | Admitting: Emergency Medicine

## 2017-05-21 DIAGNOSIS — K0889 Other specified disorders of teeth and supporting structures: Secondary | ICD-10-CM

## 2017-05-21 DIAGNOSIS — M79641 Pain in right hand: Secondary | ICD-10-CM

## 2017-05-21 MED ORDER — PENICILLIN V POTASSIUM 500 MG PO TABS: 500.0000 mg | ORAL_TABLET | Freq: Four times a day (QID) | ORAL | 0 refills | Status: AC

## 2017-05-21 MED ORDER — BENZOCAINE 10 % MT GEL: 1.0000 "application " | OROMUCOSAL | 0 refills | Status: DC | PRN

## 2017-05-21 MED ORDER — KETOROLAC TROMETHAMINE 30 MG/ML IJ SOLN
30.0000 mg | Freq: Once | INTRAMUSCULAR | Status: AC
  Administered 2017-05-21: 30 mg via INTRAMUSCULAR

## 2017-05-21 MED ORDER — PREDNISONE 20 MG PO TABS: 40.0000 mg | ORAL_TABLET | Freq: Every day | ORAL | 0 refills | Status: AC

## 2017-05-21 MED ORDER — KETOROLAC TROMETHAMINE 30 MG/ML IJ SOLN
INTRAMUSCULAR | Status: AC
  Filled 2017-05-21: qty 1
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
## 2017-05-21 NOTE — ED Triage Notes (Signed)
PT reports right arm pain for over 1 week. PT was seen in the ER 2/15 and was diagnosed with carpal tunnel in right arm. PT sees her PCP Friday.   PT reports dental pain for 1.5 weeks.

## 2017-05-21 NOTE — Discharge Instructions (Signed)
Toradol injection in office today. Continue mobic for pain. Prednisone as directed. Follow up with PCP as scheduled for reevaluation.  Start Penicillin as directed for dental abscess. Mobic and orajel for pain. Follow up with dentist for further treatment and evaluation. If experiencing swelling of the throat, trouble breathing, trouble swallowing, go to the emergency department for further evaluation.

## 2017-05-24 ENCOUNTER — Ambulatory Visit (INDEPENDENT_AMBULATORY_CARE_PROVIDER_SITE_OTHER): Payer: Self-pay | Admitting: Family Medicine

## 2017-05-24 ENCOUNTER — Encounter: Payer: Self-pay | Admitting: Family Medicine

## 2017-05-24 VITALS — BP 124/80 | HR 84 | Temp 97.6°F | Resp 16 | Ht 63.0 in | Wt 129.0 lb

## 2017-05-24 DIAGNOSIS — M79641 Pain in right hand: Secondary | ICD-10-CM

## 2017-05-24 MED ORDER — MELOXICAM 15 MG PO TABS: 15.0000 mg | ORAL_TABLET | Freq: Every day | ORAL | 0 refills | Status: DC

## 2017-05-24 MED ORDER — KETOROLAC TROMETHAMINE 30 MG/ML IJ SOLN
30.0000 mg | Freq: Once | INTRAMUSCULAR | Status: AC
  Administered 2017-05-24: 30 mg via INTRAMUSCULAR

## 2017-05-24 MED ORDER — GABAPENTIN 300 MG PO CAPS: 300.0000 mg | ORAL_CAPSULE | Freq: Three times a day (TID) | ORAL | 3 refills | Status: DC

## 2017-05-24 NOTE — Patient Instructions (Addendum)
Please continue to take the Moeloxicam as prescribed to help with the right hand pain. Please take the Gabapentin three times a day as ordered for the hand pain. Please continue to use your hand splint. Please make sure to fill out your financial assistance packet and return to the address listed on the packet.   Hand Pain Many things can cause hand pain. Some common causes are:  An injury.  Repeating the same movement with your hand over and over (overuse).  Osteoporosis.  Arthritis.  Lumps in the tendons or joints of the hand and wrist (ganglion cysts).  Infection.  Follow these instructions at home: Pay attention to any changes in your symptoms. Take these actions to help with your discomfort:  If directed, put ice on the affected area: ? Put ice in a plastic bag. ? Place a towel between your skin and the bag. ? Leave the ice on for 15-20 minutes, 3?4 times a day for 2 days.  Take over-the-counter and prescription medicines only as told by your health care provider.  Minimize stress on your hands and wrists as much as possible.  Take breaks from repetitive activity often.  Do stretches as told by your health care provider.  Do not do activities that make your pain worse.  Contact a health care provider if:  Your pain does not get better after a few days of self-care.  Your pain gets worse.  Your pain affects your ability to do your daily activities. Get help right away if:  Your hand becomes warm, red, or swollen.  Your hand is numb or tingling.  Your hand is extremely swollen or deformed.  Your hand or fingers turn white or blue.  You cannot move your hand, wrist, or fingers. This information is not intended to replace advice given to you by your health care provider. Make sure you discuss any questions you have with your health care provider. Document Released: 04/15/2015 Document Revised: 08/25/2015 Document Reviewed: 04/14/2014 Elsevier Interactive  Patient Education  [employer].
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
## 2017-06-06 ENCOUNTER — Telehealth: Payer: Self-pay

## 2017-06-10 NOTE — Addendum Note (Signed)
Addended by: Scot Jun on: 06/10/2017 09:37 PM   Modules accepted: Orders

## 2017-06-10 NOTE — Telephone Encounter (Signed)
Patient needs a referral to a ENT for her ear and a referral for carpel tunnel. Patient has orange card and [place] assistance.

## 2017-06-10 NOTE — Addendum Note (Signed)
Addended by: Scot Jun on: 06/10/2017 09:38 PM   Modules accepted: Orders

## 2017-06-10 NOTE — Telephone Encounter (Signed)
Referral placed under EMR 04/08/2017 and 05/24/2017. Patient recently received financial assistance.  Linda Cervantes. Kenton Kingfisher, MSN, FNP-C [facility]  [address] [place], [place] [address] [phone]

## 2017-06-13 ENCOUNTER — Telehealth: Payer: Self-pay

## 2017-06-13 ENCOUNTER — Ambulatory Visit (INDEPENDENT_AMBULATORY_CARE_PROVIDER_SITE_OTHER): Payer: Self-pay | Admitting: Physician Assistant

## 2017-06-13 ENCOUNTER — Encounter (INDEPENDENT_AMBULATORY_CARE_PROVIDER_SITE_OTHER): Payer: Self-pay | Admitting: Physician Assistant

## 2017-06-13 DIAGNOSIS — M79641 Pain in right hand: Secondary | ICD-10-CM

## 2017-06-13 MED ORDER — METHYLPREDNISOLONE 4 MG PO TBPK: ORAL_TABLET | ORAL | 0 refills | Status: DC

## 2017-06-13 NOTE — Telephone Encounter (Signed)
Left a vm for patient to callback 
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
## 2017-06-20 ENCOUNTER — Encounter: Payer: Self-pay | Admitting: Internal Medicine

## 2017-06-20 ENCOUNTER — Ambulatory Visit (INDEPENDENT_AMBULATORY_CARE_PROVIDER_SITE_OTHER): Payer: Self-pay | Admitting: Internal Medicine

## 2017-06-20 DIAGNOSIS — B182 Chronic viral hepatitis C: Secondary | ICD-10-CM | POA: Insufficient documentation

## 2017-06-20 NOTE — Progress Notes (Signed)
[facility]   CC: consideration for treatment for chronic hepatitis C  HPI:  +Linda Cervantes is a 52 y.o. female who presents for initial evaluation and management of chronic hepatitis C.  Patient tested positive first 19 years ago then again recently.  She also was tested in 2017 and was told that the viral load was negative but does endorse IVDU after that since.  Hepatitis C-associated risk factors present are: IV drug abuse (details: not used in over 1 year). Patient denies sexual contact with person with liver disease. Patient has had other studies performed. Results: hepatitis C RNA by PCR, result: positive. Patient has not had prior treatment for Hepatitis C. Patient does not have a past history of liver disease. Patient does not have a family history of liver disease. Patient does not  have associated signs or symptoms related to liver disease.  Labs reviewed and confirm chronic hepatitis C with a positive viral load.   Records reviewed from Epic and qualitative positive.       Patient does not have documented immunity to Hepatitis A. Patient does not have documented immunity to Hepatitis B.    Review of Systems:  Constitutional: negative for fatigue and malaise Gastrointestinal: negative for diarrhea Integument/breast: negative for rash All other systems reviewed and are negative       Past Medical History:  Diagnosis Date  . Anxiety   . Asthma   . Back pain   . Depression   . Endometriosis   . GERD (gastroesophageal reflux disease)   . Heart murmur   . 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 Substance abuse ([place]) Clean as of 2009   Crack cocaine    Prior to Admission medications   Medication Sig Start Date End Date Taking? Authorizing Provider  albuterol (PROVENTIL HFA;VENTOLIN HFA) 108 (90 Base) MCG/ACT inhaler Inhale 2 puffs into the lungs every 6 (six) hours as needed for wheezing (wheezing). 03/20/17   Scot Jun, FNP    benzocaine (ORAJEL) 10 % mucosal gel Use as directed 1 application in the mouth or throat as needed for mouth pain. 05/21/17   Tasia Catchings, Amy V, PA-C  cetirizine (ZYRTEC) 10 MG tablet Take 1 tablet (10 mg total) by mouth daily. 04/08/17   Scot Jun, FNP  fluticasone (FLONASE) 50 MCG/ACT nasal spray Place 2 sprays into both nostrils daily. 04/08/17   Scot Jun, FNP  gabapentin (NEURONTIN) 300 MG capsule Take 1 capsule (300 mg total) by mouth 2 (two) times daily. 03/20/17   Scot Jun, FNP  gabapentin (NEURONTIN) 300 MG capsule Take 1 capsule (300 mg total) by mouth 3 (three) times daily. 05/24/17   Scot Jun, FNP  HYDROcodone-acetaminophen (NORCO) 5-325 MG tablet Take 2 tablets by mouth every 4 (four) hours as needed. 05/17/17   Margarita Mail, PA-C  meclizine (ANTIVERT) 25 MG tablet Take 1 tablet (25 mg total) by mouth 3 (three) times daily as needed for dizziness. Patient not taking: Reported on 05/24/2017 12/24/16   Vanessa Kick, MD  methylPREDNISolone (MEDROL DOSEPAK) 4 MG TBPK tablet Take as directed 06/13/17   Aundra Dubin, PA-C  QUEtiapine (SEROQUEL) 100 MG tablet Take 1 tablet (100 mg total) by mouth at bedtime. 03/20/17   Scot Jun, FNP  sertraline (ZOLOFT) 50 MG tablet Take 1 tablet (50 mg total) by mouth daily. 03/20/17   Scot Jun, FNP    Allergies  Allergen Reactions  . Amitriptyline Hcl Other (  See Comments)    Face Swelling    Social History   Tobacco Use  . Smoking status: Current Every Day Smoker    Packs/day: 1.00    Types: Cigarettes  . Smokeless tobacco: Former Network engineer Use Topics  . Alcohol use: No  . Drug use: No    Types: Marijuana, Cocaine, Heroin    Comment: PAST USE    Family History  Problem Relation Age of Onset  . Hypertension Father   . Diabetes Maternal Grandmother      Objective:  Constitutional: in no apparent distress,  Vitals:   06/20/17 1538  BP: 124/76  Pulse: 79  Temp: 97.8 F (36.6 C)    Eyes: anicteric Cardiovascular: Cor RRR Respiratory: CTA B; normal respiratory effort Gastrointestinal: Bowel sounds are normal, liver is not enlarged, spleen is not enlarged Musculoskeletal: no pedal edema noted Skin: negatives: no rash; no porphyria cutanea tarda Lymphatic: no cervical lymphadenopathy   Laboratory Genotype: No results found for: HCVGENOTYPE HCV viral load: No results found for: HCVQUANT Lab Results  Component Value Date   WBC 8.0 03/20/2017   HGB 14.1 03/20/2017   HCT 41.8 03/20/2017   MCV 84 03/20/2017   PLT 216 03/20/2017    Lab Results  Component Value Date   CREATININE 1.03 (H) 03/20/2017   BUN 10 03/20/2017   NA 141 03/20/2017   K 4.3 03/20/2017   CL 100 03/20/2017   CO2 25 03/20/2017    Lab Results  Component Value Date   ALT 16 03/20/2017   AST 25 03/20/2017   ALKPHOS 72 03/20/2017     Labs and history reviewed and show CHILD-PUGH unknown  5-6 points: Child class A 7-9 points: Child class B 10-15 points: Child class C  Lab Results  Component Value Date   BILITOT 0.2 03/20/2017   ALBUMIN 4.6 03/20/2017     Assessment: New Patient with Chronic Hepatitis C genotype unknown, untreated.  I discussed with the patient the lab findings that confirm chronic hepatitis C as well as the natural history and progression of disease including about 30% of people who develop cirrhosis of the liver if left untreated and once cirrhosis is established there is a 2-7% risk per year of liver cancer and liver failure.  I discussed the importance of treatment and benefits in reducing the risk, even if significant liver fibrosis exists.   Plan: 1) Patient counseled extensively on limiting acetaminophen to no more than 2 grams daily, avoidance of alcohol. 2) Transmission discussed with patient including sexual transmission, sharing razors and toothbrush.   3) Will need referral to gastroenterology if concern for cirrhosis 4) Will need referral for substance  abuse counseling: No.; Further work up to include urine drug screen  No. 5) Will prescribe appropriate medication based on genotype and coverage after she follows up with me next time 6) Hepatitis A and B titers 7) Pneumovax vaccine if not previously given, next visit 9) Further work up to include liver staging with elastography 10) will follow up after above work up and consider treatment options.

## 2017-06-21 LAB — HIV ANTIBODY (ROUTINE TESTING W REFLEX): HIV 1&2 Ab, 4th Generation: NONREACTIVE

## 2017-06-21 LAB — CBC WITH DIFFERENTIAL/PLATELET
BASOS PCT: 0.3 %
Basophils Absolute: 27 cells/uL (ref 0–200)
EOS ABS: 142 {cells}/uL (ref 15–500)
Eosinophils Relative: 1.6 %
HCT: 35.5 % (ref 35.0–45.0)
Hemoglobin: 12.3 g/dL (ref 11.7–15.5)
Lymphs Abs: 2572 cells/uL (ref 850–3900)
MCH: 27.9 pg (ref 27.0–33.0)
MCHC: 34.6 g/dL (ref 32.0–36.0)
MCV: 80.5 fL (ref 80.0–100.0)
MONOS PCT: 8.5 %
MPV: 9.2 fL (ref 7.5–12.5)
Neutro Abs: 5402 cells/uL (ref 1500–7800)
Neutrophils Relative %: 60.7 %
PLATELETS: 243 10*3/uL (ref 140–400)
RBC: 4.41 10*6/uL (ref 3.80–5.10)
RDW: 13 % (ref 11.0–15.0)
Total Lymphocyte: 28.9 %
WBC: 8.9 10*3/uL (ref 3.8–10.8)
WBCMIX: 757 {cells}/uL (ref 200–950)

## 2017-06-21 LAB — COMPLETE METABOLIC PANEL WITH GFR
AG Ratio: 1.4 (calc) (ref 1.0–2.5)
ALT: 10 U/L (ref 6–29)
AST: 17 U/L (ref 10–35)
Albumin: 3.8 g/dL (ref 3.6–5.1)
Alkaline phosphatase (APISO): 61 U/L (ref 33–115)
BUN/Creatinine Ratio: 5 (calc) — ABNORMAL LOW (ref 6–22)
BUN: 4 mg/dL — ABNORMAL LOW (ref 7–25)
CALCIUM: 9.1 mg/dL (ref 8.6–10.2)
CO2: 30 mmol/L (ref 20–32)
CREATININE: 0.79 mg/dL (ref 0.50–1.10)
Chloride: 103 mmol/L (ref 98–110)
GFR, EST NON AFRICAN AMERICAN: 88 mL/min/{1.73_m2} (ref >=60)
GFR, Est African American: 102 mL/min/{1.73_m2} (ref >=60)
Globulin: 2.8 g/dL (calc) (ref 1.9–3.7)
Glucose, Bld: 78 mg/dL (ref 65–99)
POTASSIUM: 4.2 mmol/L (ref 3.5–5.3)
Sodium: 138 mmol/L (ref 135–146)
Total Bilirubin: 0.3 mg/dL (ref 0.2–1.2)
Total Protein: 6.6 g/dL (ref 6.1–8.1)

## 2017-06-21 LAB — HEPATITIS B SURFACE ANTIGEN: Hepatitis B Surface Ag: NONREACTIVE

## 2017-06-21 LAB — HEPATITIS B SURFACE ANTIBODY,QUALITATIVE: Hep B S Ab: NONREACTIVE

## 2017-06-21 LAB — HEPATITIS B CORE ANTIBODY, TOTAL: Hep B Core Total Ab: NONREACTIVE

## 2017-06-21 LAB — PROTIME-INR
INR: 1
Prothrombin Time: 10.4 s (ref 9.0–11.5)

## 2017-06-21 LAB — HEPATITIS A ANTIBODY, TOTAL: Hepatitis A AB,Total: NONREACTIVE

## 2017-06-25 LAB — LIVER FIBROSIS, FIBROTEST-ACTITEST
ALPHA-2-MACROGLOBULIN: 227 mg/dL (ref 106–279)
ALT: 10 U/L (ref 6–29)
Apolipoprotein A1: 147 mg/dL (ref 101–198)
Bilirubin: 0.3 mg/dL (ref 0.2–1.2)
FIBROSIS SCORE: 0.1
GGT: 10 U/L (ref 3–55)
HAPTOGLOBIN: 130 mg/dL (ref 43–212)
NECROINFLAMMAT ACT SCORE: 0.02
Reference ID: 2391856

## 2017-06-25 LAB — HEPATITIS C RNA QUANTITATIVE
HCV QUANT LOG: 2.85 {Log_IU}/mL — AB
HCV RNA, PCR, QN: 713 [IU]/mL — AB

## 2017-06-25 LAB — HEPATITIS C GENOTYPE: HCV GENOTYPE: 2

## 2017-06-26 ENCOUNTER — Ambulatory Visit (HOSPITAL_COMMUNITY): Payer: Self-pay

## 2017-06-28 ENCOUNTER — Telehealth: Payer: Self-pay

## 2017-07-01 ENCOUNTER — Encounter: Payer: Self-pay | Admitting: Family Medicine

## 2017-07-01 ENCOUNTER — Ambulatory Visit (INDEPENDENT_AMBULATORY_CARE_PROVIDER_SITE_OTHER): Payer: Self-pay | Admitting: Family Medicine

## 2017-07-01 VITALS — BP 132/90 | HR 89 | Temp 97.8°F | Ht 63.0 in | Wt 129.6 lb

## 2017-07-01 DIAGNOSIS — K1370 Unspecified lesions of oral mucosa: Secondary | ICD-10-CM

## 2017-07-01 DIAGNOSIS — H6982 Other specified disorders of Eustachian tube, left ear: Secondary | ICD-10-CM

## 2017-07-01 DIAGNOSIS — K0889 Other specified disorders of teeth and supporting structures: Secondary | ICD-10-CM

## 2017-07-01 DIAGNOSIS — Z1389 Encounter for screening for other disorder: Secondary | ICD-10-CM

## 2017-07-01 LAB — POCT URINALYSIS DIP (DEVICE)
Bilirubin Urine: NEGATIVE
Glucose, UA: NEGATIVE mg/dL
HGB URINE DIPSTICK: NEGATIVE
Ketones, ur: 15 mg/dL — AB
Leukocytes, UA: NEGATIVE
NITRITE: NEGATIVE
PROTEIN: NEGATIVE mg/dL
Specific Gravity, Urine: 1.025 (ref 1.005–1.030)
Urobilinogen, UA: 0.2 mg/dL (ref 0.0–1.0)
pH: 5.5 (ref 5.0–8.0)

## 2017-07-01 NOTE — Telephone Encounter (Signed)
Left a vm for patient but she has appointment today

## 2017-07-01 NOTE — Patient Instructions (Addendum)
I will have you follow up with ENT for continued ear discomfort and I am placing new referral to the ENT for further evaluation of oral cancer. In the meantime continue gargle warm salt water for mouth discomfort as needed.  Once you have gotten your medicaid a referral will placed for pain management.  Follow-up with your mental health provider as you appear very agitated today. Your medications may require adjusting.   Contact ENT to reschedule your appointment  [place], [place], [place] [address] Phone: [phone]
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
## 2017-07-08 ENCOUNTER — Ambulatory Visit: Payer: Self-pay | Admitting: Family Medicine

## 2017-07-11 ENCOUNTER — Ambulatory Visit: Payer: Self-pay | Admitting: Internal Medicine

## 2017-07-17 ENCOUNTER — Other Ambulatory Visit: Payer: Self-pay | Admitting: Family Medicine

## 2017-07-17 ENCOUNTER — Encounter (INDEPENDENT_AMBULATORY_CARE_PROVIDER_SITE_OTHER): Payer: Self-pay | Admitting: Physical Medicine and Rehabilitation

## 2017-07-17 MED FILL — ALBUTEROL SULFATE HFA 108 (: 108 (90 BAS | 25 days supply | Qty: 18 | Fill #0

## 2017-07-22 ENCOUNTER — Ambulatory Visit (INDEPENDENT_AMBULATORY_CARE_PROVIDER_SITE_OTHER): Payer: Self-pay | Admitting: Otolaryngology

## 2017-07-25 ENCOUNTER — Other Ambulatory Visit (INDEPENDENT_AMBULATORY_CARE_PROVIDER_SITE_OTHER): Payer: Self-pay | Admitting: Otolaryngology

## 2017-07-25 ENCOUNTER — Ambulatory Visit (INDEPENDENT_AMBULATORY_CARE_PROVIDER_SITE_OTHER): Payer: Self-pay | Admitting: Otolaryngology

## 2017-07-25 DIAGNOSIS — D3709 Neoplasm of uncertain behavior of other specified sites of the oral cavity: Secondary | ICD-10-CM

## 2017-07-25 MED FILL — HYDROCODON-APAP 5-325: 5-325 | 3 days supply | Qty: 15 | Fill #0

## 2017-07-25 MED FILL — GABAPENTIN 300 MG CAPSULE: 300 | 30 days supply | Qty: 90 | Fill #0

## 2017-07-25 MED FILL — AMOXICILLIN 875 MG TABLET: 875 | 5 days supply | Qty: 10 | Fill #0

## 2017-08-07 MED FILL — predniSONE 10 MG TABS: 10 | 6 days supply | Qty: 21 | Fill #0

## 2017-08-15 MED FILL — AMOXICILLIN 500 MG CAPSULE: 500 | 7 days supply | Qty: 21 | Fill #0

## 2017-08-15 MED FILL — IBUPROFEN 800 MG TAB: 800 | 5 days supply | Qty: 15 | Fill #0

## 2017-08-15 MED FILL — HYDROCODON-APAP 5-325: 5-325 | 3 days supply | Qty: 15 | Fill #0

## 2017-08-16 ENCOUNTER — Encounter (INDEPENDENT_AMBULATORY_CARE_PROVIDER_SITE_OTHER): Payer: Self-pay | Admitting: Physical Medicine and Rehabilitation

## 2017-08-16 ENCOUNTER — Ambulatory Visit (HOSPITAL_COMMUNITY)
Admission: EM | Admit: 2017-08-16 | Discharge: 2017-08-16 | Disposition: A | Discharge status: Home / Self Care | Payer: Self-pay | Attending: Family Medicine | Admitting: Family Medicine

## 2017-08-16 ENCOUNTER — Encounter (HOSPITAL_COMMUNITY): Payer: Self-pay | Admitting: *Deleted

## 2017-08-16 DIAGNOSIS — K0889 Other specified disorders of teeth and supporting structures: Secondary | ICD-10-CM

## 2017-08-16 MED ORDER — KETOROLAC TROMETHAMINE 60 MG/2ML IM SOLN
60.0000 mg | Freq: Once | INTRAMUSCULAR | Status: AC
  Administered 2017-08-16: 60 mg via INTRAMUSCULAR

## 2017-08-16 MED ORDER — KETOROLAC TROMETHAMINE 60 MG/2ML IM SOLN
INTRAMUSCULAR | Status: AC
  Filled 2017-08-16: qty 2
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
## 2017-08-16 NOTE — Discharge Instructions (Signed)
Use anti-inflammatories for pain/swelling. You may take up to 800 mg Ibuprofen every 8 hours with food. You may supplement Ibuprofen with Tylenol [phone] mg every 8 hours.   Follow up with Dentistry on monday

## 2017-08-16 NOTE — ED Triage Notes (Signed)
Patient reports having dentures placed to upper mouth today, now has gum bleeding to the area. Patient is not on any blood thinners or ASA. Patient was told not to take dentures out until Sunday.

## 2017-08-30 ENCOUNTER — Ambulatory Visit (INDEPENDENT_AMBULATORY_CARE_PROVIDER_SITE_OTHER): Payer: Self-pay | Admitting: Physical Medicine and Rehabilitation

## 2017-08-30 ENCOUNTER — Encounter (INDEPENDENT_AMBULATORY_CARE_PROVIDER_SITE_OTHER): Payer: Self-pay | Admitting: Physical Medicine and Rehabilitation

## 2017-08-30 DIAGNOSIS — R202 Paresthesia of skin: Secondary | ICD-10-CM

## 2017-08-30 NOTE — Procedures (Signed)
EMG & NCV Findings: Evaluation of the right median motor nerve showed prolonged distal onset latency (5.5 ms), reduced amplitude (4.3 mV), and decreased conduction velocity (Elbow-Wrist, 47 m/s).  The right median (across palm) sensory nerve showed prolonged distal peak latency (Wrist, 6.8 ms) and prolonged distal peak latency (Palm, 2.7 ms).  All remaining nerves (as indicated in the following tables) were within normal limits.    Needle evaluation of the right abductor pollicis brevis muscle showed diminished recruitment.  All remaining muscles (as indicated in the following table) showed no evidence of electrical instability.    Impression: The above electrodiagnostic study is ABNORMAL and reveals evidence of a moderate to severe right median nerve entrapment at the wrist (carpal tunnel syndrome) affecting sensory and motor components.   There is no significant electrodiagnostic evidence of any other focal nerve entrapment, brachial plexopathy or generalized peripheral neuropathy.   Recommendations: 1.  Follow-up with referring physician. 2.  Continue current management of symptoms. 3.  Suggest surgical evaluation.  Patient should get good recovery with appropriate decompression.   Nerve Conduction Studies Anti Sensory Summary Table   Stim Site NR Peak (ms) Norm Peak (ms) P-T Amp (V) Norm P-T Amp Site1 Site2 Delta-P (ms) Dist (cm) Vel (m/s) Norm Vel (m/s)  Right Median Acr Palm Anti Sensory (2nd Digit)  31.9C  Wrist    *6.8 <3.6 16.1 >10 Wrist Palm 4.1 0.0    Palm    *2.7 <2.0 5.7         Right Radial Anti Sensory (Base 1st Digit)  33.4C  Wrist    2.3 <3.1 28.1  Wrist Base 1st Digit 2.3 0.0    Right Ulnar Anti Sensory (5th Digit)  32.4C  Wrist    3.7 <3.7 40.1 >15.0 Wrist 5th Digit 3.7 14.0 38 >38   Motor Summary Table   Stim Site NR Onset (ms) Norm Onset (ms) O-P Amp (mV) Norm O-P Amp Site1 Site2 Delta-0 (ms) Dist (cm) Vel (m/s) Norm Vel (m/s)  Right Median Motor (Abd Poll  Brev)  33.4C  Wrist    *5.5 <4.2 *4.3 >5 Elbow Wrist 4.3 20.3 *47 >50  Elbow    9.8  4.0         Right Ulnar Motor (Abd Dig Min)  33.1C  Wrist    3.0 <4.2 9.2 >3 B Elbow Wrist 3.6 19.5 54 >53  B Elbow    6.6  8.7  A Elbow B Elbow 1.2 10.0 83 >53  A Elbow    7.8  8.6          EMG   Side Muscle Nerve Root Ins Act Fibs Psw Amp Dur Poly Recrt Int Fraser Din Comment  Right Abd Poll Brev Median C8-T1 Nml Nml Nml Nml Nml 0 *Reduced Nml   Right 1stDorInt Ulnar C8-T1 Nml Nml Nml Nml Nml 0 Nml Nml     Nerve Conduction Studies Anti Sensory Left/Right Comparison   Stim Site L Lat (ms) R Lat (ms) L-R Lat (ms) L Amp (V) R Amp (V) L-R Amp (%) Site1 Site2 L Vel (m/s) R Vel (m/s) L-R Vel (m/s)  Median Acr Palm Anti Sensory (2nd Digit)  31.9C  Wrist  *6.8   16.1  Wrist Palm     Palm  *2.7   5.7        Radial Anti Sensory (Base 1st Digit)  33.4C  Wrist  2.3   28.1  Wrist Base 1st Digit     Ulnar Anti Sensory (5th Digit)  32.4C  Wrist  3.7   40.1  Wrist 5th Digit  38    Motor Left/Right Comparison   Stim Site L Lat (ms) R Lat (ms) L-R Lat (ms) L Amp (mV) R Amp (mV) L-R Amp (%) Site1 Site2 L Vel (m/s) R Vel (m/s) L-R Vel (m/s)  Median Motor (Abd Poll Brev)  33.4C  Wrist  *5.5   *4.3  Elbow Wrist  *47   Elbow  9.8   4.0        Ulnar Motor (Abd Dig Min)  33.1C  Wrist  3.0   9.2  B Elbow Wrist  54   B Elbow  6.6   8.7  A Elbow B Elbow  83   A Elbow  7.8   8.6           Waveforms:
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
## 2017-08-30 NOTE — Progress Notes (Signed)
.  Numeric Pain Rating Scale and Functional Assessment Average Pain 10   In the last MONTH (on 0-10 scale) has pain interfered with the following?  1. General activity like being  able to carry out your everyday physical activities such as walking, climbing stairs, carrying groceries, or moving a chair?  Rating(8)    

## 2017-09-02 ENCOUNTER — Ambulatory Visit (INDEPENDENT_AMBULATORY_CARE_PROVIDER_SITE_OTHER): Payer: Self-pay | Admitting: Otolaryngology

## 2017-09-20 ENCOUNTER — Ambulatory Visit (INDEPENDENT_AMBULATORY_CARE_PROVIDER_SITE_OTHER): Payer: Self-pay | Admitting: Physician Assistant

## 2017-10-10 ENCOUNTER — Emergency Department (HOSPITAL_COMMUNITY)
Admission: EM | Admit: 2017-10-10 | Discharge: 2017-10-11 | Disposition: A | Discharge status: Home / Self Care | Payer: Self-pay | Attending: Emergency Medicine | Admitting: Emergency Medicine

## 2017-10-10 ENCOUNTER — Other Ambulatory Visit: Payer: Self-pay

## 2017-10-10 DIAGNOSIS — F419 Anxiety disorder, unspecified: Secondary | ICD-10-CM | POA: Insufficient documentation

## 2017-10-10 DIAGNOSIS — M7918 Myalgia, other site: Secondary | ICD-10-CM | POA: Insufficient documentation

## 2017-10-10 DIAGNOSIS — F17210 Nicotine dependence, cigarettes, uncomplicated: Secondary | ICD-10-CM | POA: Insufficient documentation

## 2017-10-10 DIAGNOSIS — F329 Major depressive disorder, single episode, unspecified: Secondary | ICD-10-CM | POA: Insufficient documentation

## 2017-10-10 DIAGNOSIS — Z79899 Other long term (current) drug therapy: Secondary | ICD-10-CM | POA: Insufficient documentation

## 2017-10-10 DIAGNOSIS — J45909 Unspecified asthma, uncomplicated: Secondary | ICD-10-CM | POA: Insufficient documentation
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
## 2017-10-11 ENCOUNTER — Encounter (HOSPITAL_COMMUNITY): Payer: Self-pay | Admitting: Emergency Medicine

## 2017-10-11 ENCOUNTER — Other Ambulatory Visit: Payer: Self-pay

## 2017-10-11 MED ORDER — IBUPROFEN 400 MG PO TABS
600.0000 mg | ORAL_TABLET | Freq: Once | ORAL | Status: AC
  Administered 2017-10-11: 04:00:00 600 mg via ORAL
  Filled 2017-10-11: qty 1

## 2017-10-11 MED ORDER — METHOCARBAMOL 500 MG PO TABS: 500.0000 mg | ORAL_TABLET | Freq: Two times a day (BID) | ORAL | 0 refills | Status: DC

## 2017-10-11 MED ORDER — IBUPROFEN 600 MG PO TABS: 600.0000 mg | ORAL_TABLET | Freq: Four times a day (QID) | ORAL | 0 refills | Status: DC | PRN

## 2017-10-11 MED ORDER — METHOCARBAMOL 500 MG PO TABS
750.0000 mg | ORAL_TABLET | Freq: Once | ORAL | Status: AC
Start: 2017-10-11 — End: 2017-10-11
  Administered 2017-10-11: 750 mg via ORAL
  Filled 2017-10-11: qty 2
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
## 2017-11-01 ENCOUNTER — Ambulatory Visit (INDEPENDENT_AMBULATORY_CARE_PROVIDER_SITE_OTHER): Payer: Self-pay | Admitting: Physician Assistant

## 2018-01-01 ENCOUNTER — Ambulatory Visit: Payer: Self-pay | Admitting: Family Medicine

## 2018-01-27 ENCOUNTER — Emergency Department (HOSPITAL_COMMUNITY)
Admission: EM | Admit: 2018-01-27 | Discharge: 2018-01-28 | Disposition: A | Discharge status: Home / Self Care | Payer: Self-pay | Attending: Emergency Medicine | Admitting: Emergency Medicine

## 2018-01-27 ENCOUNTER — Encounter (HOSPITAL_COMMUNITY): Payer: Self-pay | Admitting: Emergency Medicine

## 2018-01-27 DIAGNOSIS — R1310 Dysphagia, unspecified: Secondary | ICD-10-CM | POA: Insufficient documentation

## 2018-01-27 DIAGNOSIS — M25511 Pain in right shoulder: Secondary | ICD-10-CM | POA: Insufficient documentation

## 2018-01-27 DIAGNOSIS — M25512 Pain in left shoulder: Secondary | ICD-10-CM | POA: Insufficient documentation

## 2018-01-27 DIAGNOSIS — J45909 Unspecified asthma, uncomplicated: Secondary | ICD-10-CM | POA: Insufficient documentation

## 2018-01-27 DIAGNOSIS — Z79899 Other long term (current) drug therapy: Secondary | ICD-10-CM | POA: Insufficient documentation

## 2018-01-27 DIAGNOSIS — F17210 Nicotine dependence, cigarettes, uncomplicated: Secondary | ICD-10-CM | POA: Insufficient documentation

## 2018-01-27 NOTE — ED Triage Notes (Signed)
Pt has multiple complaints.  Pt reports she found a mass in her throat that causes her trouble swallowing.  She states she also has bilateral should pain that goes down her arms. Pt also has "hives on my arms."  Pt is very figidity in triage, unable to sit still.

## 2018-01-28 ENCOUNTER — Emergency Department (HOSPITAL_COMMUNITY): Payer: Self-pay

## 2018-01-28 LAB — CBC WITH DIFFERENTIAL/PLATELET
Abs Immature Granulocytes: 0.01 10*3/uL (ref 0.00–0.07)
BASOS ABS: 0 10*3/uL (ref 0.0–0.1)
Basophils Relative: 1 %
EOS PCT: 3 %
Eosinophils Absolute: 0.2 10*3/uL (ref 0.0–0.5)
HCT: 39.9 % (ref 36.0–46.0)
HEMOGLOBIN: 12.4 g/dL (ref 12.0–15.0)
IMMATURE GRANULOCYTES: 0 %
LYMPHS ABS: 3.3 10*3/uL (ref 0.7–4.0)
LYMPHS PCT: 48 %
MCH: 26.4 pg (ref 26.0–34.0)
MCHC: 31.1 g/dL (ref 30.0–36.0)
MCV: 84.9 fL (ref 80.0–100.0)
Monocytes Absolute: 0.6 10*3/uL (ref 0.1–1.0)
Monocytes Relative: 10 %
NEUTROS PCT: 38 %
NRBC: 0 % (ref 0.0–0.2)
Neutro Abs: 2.6 10*3/uL (ref 1.7–7.7)
Platelets: 187 10*3/uL (ref 150–400)
RBC: 4.7 MIL/uL (ref 3.87–5.11)
RDW: 13 % (ref 11.5–15.5)
WBC: 6.8 10*3/uL (ref 4.0–10.5)

## 2018-01-28 LAB — COMPREHENSIVE METABOLIC PANEL
ALT: 45 U/L — ABNORMAL HIGH (ref 0–44)
ANION GAP: 5 (ref 5–15)
AST: 41 U/L (ref 15–41)
Albumin: 3.8 g/dL (ref 3.5–5.0)
Alkaline Phosphatase: 58 U/L (ref 38–126)
BUN: 8 mg/dL (ref 6–20)
CHLORIDE: 104 mmol/L (ref 98–111)
CO2: 29 mmol/L (ref 22–32)
Calcium: 9.5 mg/dL (ref 8.9–10.3)
Creatinine, Ser: 1 mg/dL (ref 0.44–1.00)
Glucose, Bld: 114 mg/dL — ABNORMAL HIGH (ref 70–99)
POTASSIUM: 3.8 mmol/L (ref 3.5–5.1)
SODIUM: 138 mmol/L (ref 135–145)
Total Bilirubin: 0.5 mg/dL (ref 0.3–1.2)
Total Protein: 6.7 g/dL (ref 6.5–8.1)

## 2018-01-28 LAB — I-STAT BETA HCG BLOOD, ED (MC, WL, AP ONLY)

## 2018-01-28 IMAGING — CR DG NECK SOFT TISSUE
2 series · 2 of 2 positions shown · non-contrast
Comparison: None.

CLINICAL DATA: Difficulty swallowing 4 days.

EXAM:
NECK SOFT TISSUES - 1+ VIEW

[neck lat]
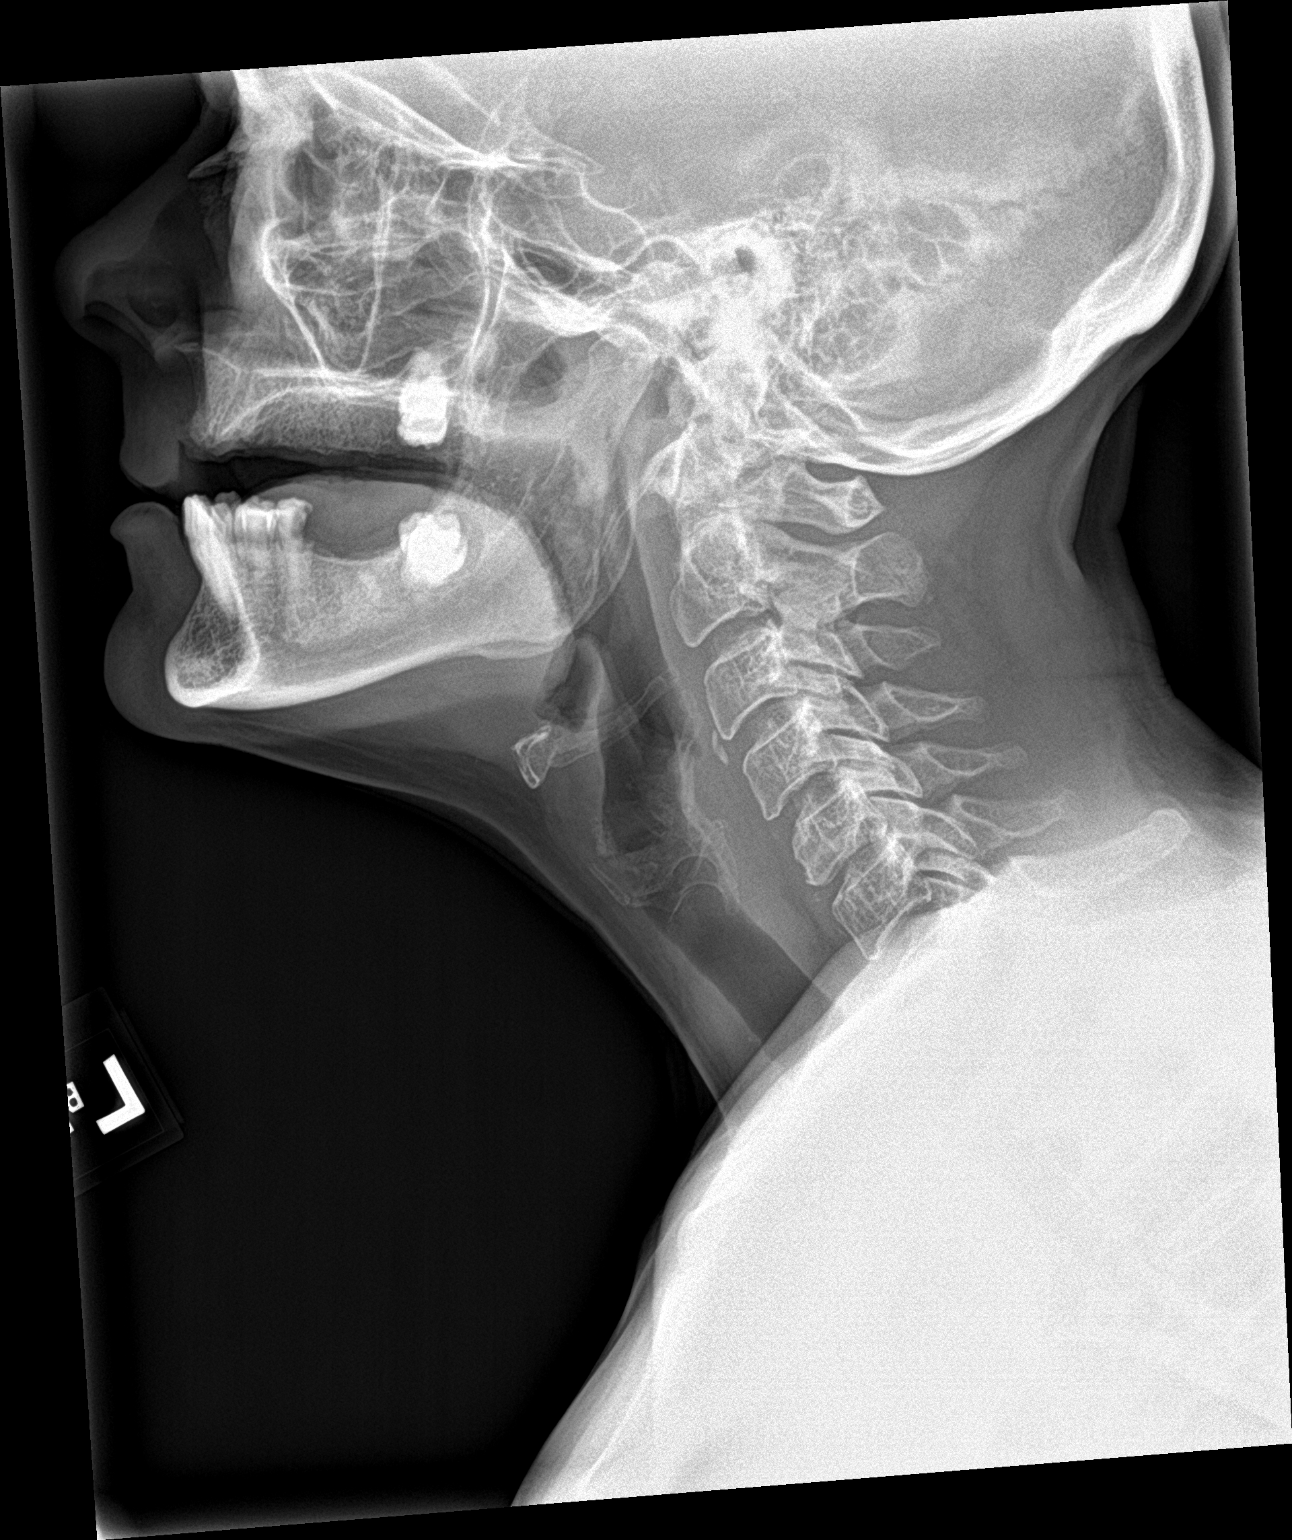

[neck ap]
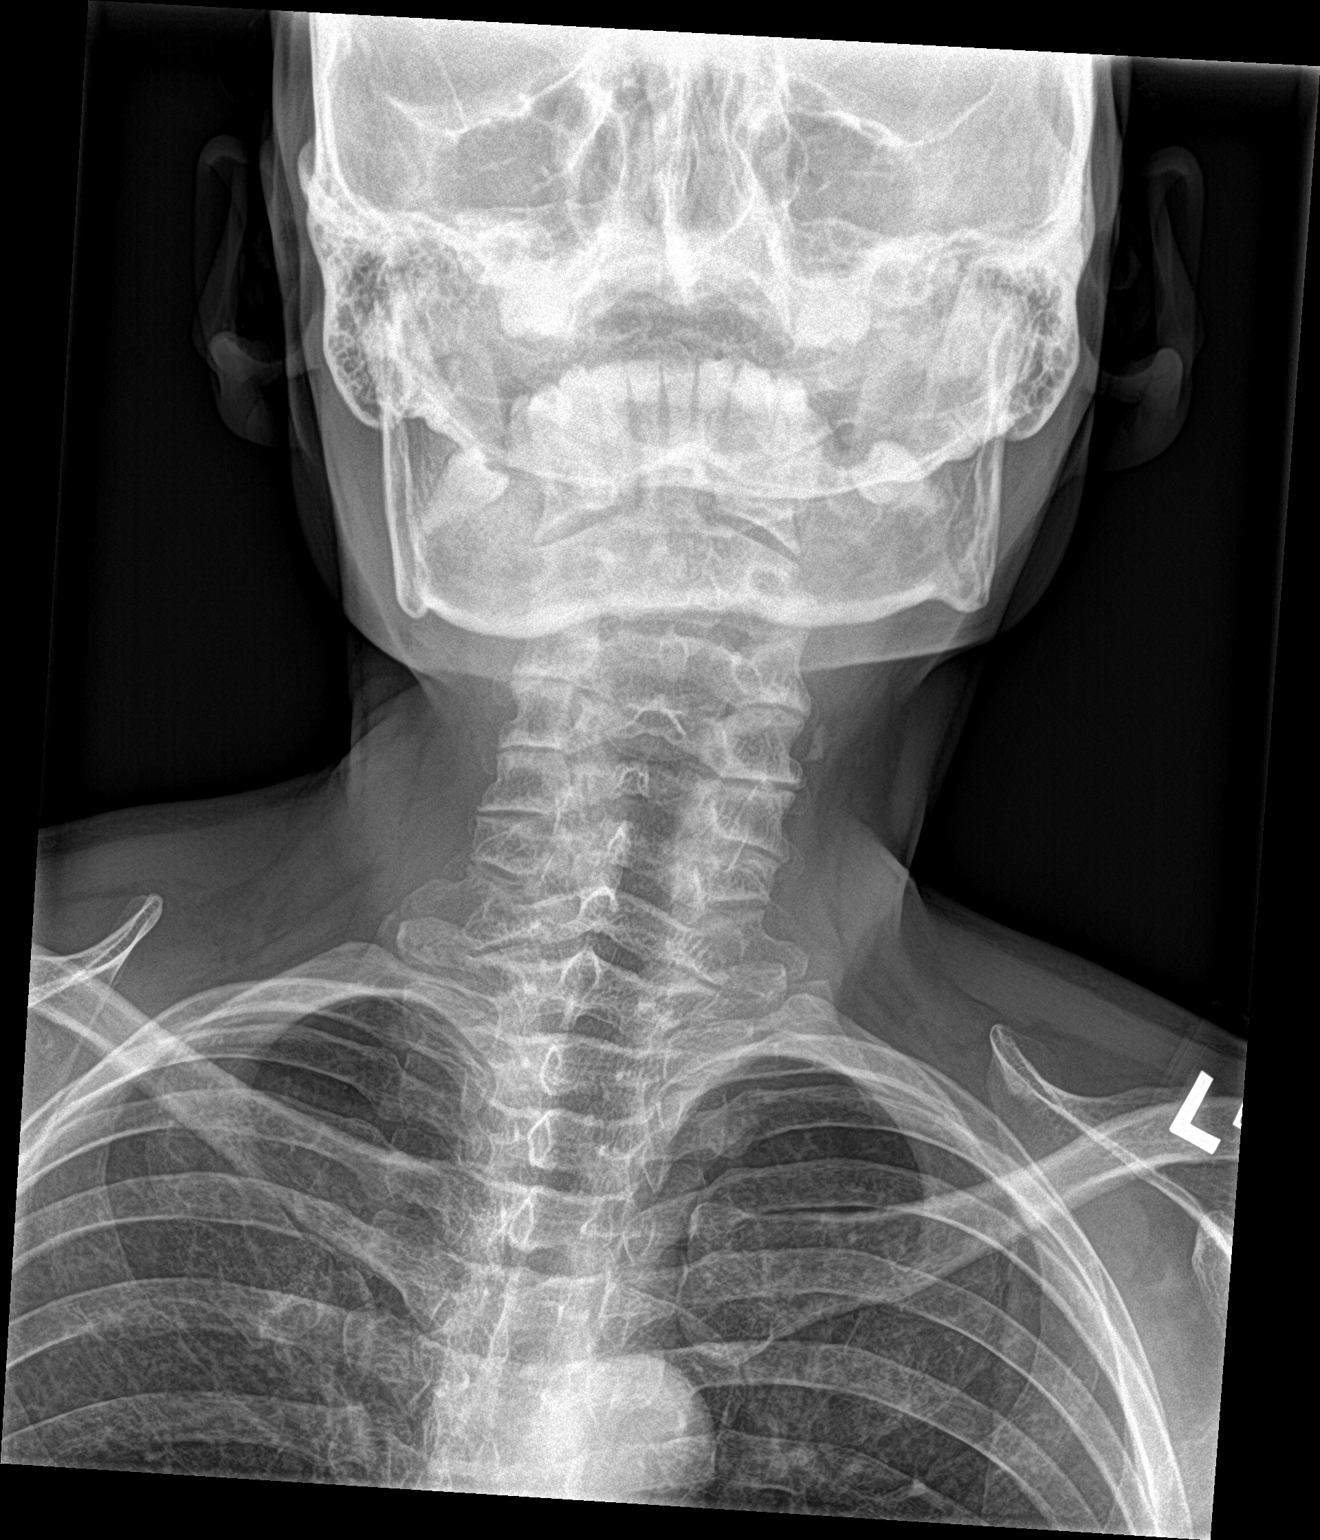

[2 of 2 positions shown; findings below may reference images not displayed]

FINDINGS: There is no evidence of retropharyngeal soft tissue swelling or
epiglottic enlargement. The cervical airway is unremarkable and no
radio-opaque foreign body identified. Mild degenerative change of
the spine.
IMPRESSION: Negative.

## 2018-01-28 MED ORDER — IBUPROFEN 400 MG PO TABS
400.0000 mg | ORAL_TABLET | Freq: Once | ORAL | Status: AC
  Administered 2018-01-28: 400 mg via ORAL
  Filled 2018-01-28: qty 1
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
## 2018-01-28 NOTE — ED Provider Notes (Signed)
Pt in no distress, asleep during discussion at time of discharge but she was able to wake and understand need for f/u   Ripley Fraise, MD 01/28/18 0230

## 2018-02-04 ENCOUNTER — Ambulatory Visit (INDEPENDENT_AMBULATORY_CARE_PROVIDER_SITE_OTHER): Payer: Self-pay | Admitting: Family Medicine

## 2018-02-04 ENCOUNTER — Encounter: Payer: Self-pay | Admitting: Family Medicine

## 2018-02-04 ENCOUNTER — Telehealth: Payer: Self-pay

## 2018-02-04 VITALS — BP 110/86 | HR 80 | Temp 98.2°F | Ht 63.0 in | Wt 114.6 lb

## 2018-02-04 DIAGNOSIS — M79641 Pain in right hand: Secondary | ICD-10-CM

## 2018-02-04 DIAGNOSIS — R8290 Unspecified abnormal findings in urine: Secondary | ICD-10-CM

## 2018-02-04 DIAGNOSIS — R21 Rash and other nonspecific skin eruption: Secondary | ICD-10-CM

## 2018-02-04 DIAGNOSIS — F419 Anxiety disorder, unspecified: Secondary | ICD-10-CM

## 2018-02-04 DIAGNOSIS — H6992 Unspecified Eustachian tube disorder, left ear: Secondary | ICD-10-CM

## 2018-02-04 DIAGNOSIS — B1920 Unspecified viral hepatitis C without hepatic coma: Secondary | ICD-10-CM

## 2018-02-04 DIAGNOSIS — Z09 Encounter for follow-up examination after completed treatment for conditions other than malignant neoplasm: Secondary | ICD-10-CM

## 2018-02-04 DIAGNOSIS — Z131 Encounter for screening for diabetes mellitus: Secondary | ICD-10-CM

## 2018-02-04 DIAGNOSIS — H6982 Other specified disorders of Eustachian tube, left ear: Secondary | ICD-10-CM

## 2018-02-04 DIAGNOSIS — Z0000 Encounter for general adult medical examination without abnormal findings: Secondary | ICD-10-CM

## 2018-02-04 DIAGNOSIS — M542 Cervicalgia: Secondary | ICD-10-CM

## 2018-02-04 LAB — POCT URINALYSIS DIP (MANUAL ENTRY)
Bilirubin, UA: NEGATIVE
Glucose, UA: NEGATIVE mg/dL
Ketones, POC UA: NEGATIVE mg/dL
Leukocytes, UA: NEGATIVE
Nitrite, UA: NEGATIVE
Protein Ur, POC: NEGATIVE mg/dL
Spec Grav, UA: 1.02 (ref 1.010–1.025)
Urobilinogen, UA: 1 E.U./dL
pH, UA: 6.5 (ref 5.0–8.0)

## 2018-02-04 LAB — POCT GLYCOSYLATED HEMOGLOBIN (HGB A1C): Hemoglobin A1C: 5.3 % (ref 4.0–5.6)

## 2018-02-04 MED ORDER — IBUPROFEN 800 MG PO TABS: 800.0000 mg | ORAL_TABLET | Freq: Three times a day (TID) | ORAL | 0 refills | Status: DC | PRN

## 2018-02-04 MED ORDER — CETIRIZINE HCL 10 MG PO TABS: 10.0000 mg | ORAL_TABLET | Freq: Every day | ORAL | 11 refills | Status: DC

## 2018-02-04 MED ORDER — ALBUTEROL SULFATE HFA 108 (90 BASE) MCG/ACT IN AERS: 2.0000 | INHALATION_SPRAY | Freq: Four times a day (QID) | RESPIRATORY_TRACT | 1 refills | Status: DC | PRN

## 2018-02-04 MED ORDER — ALBUTEROL SULFATE HFA 108 (90 BASE) MCG/ACT IN AERS: INHALATION_SPRAY | RESPIRATORY_TRACT | 1 refills | Status: DC

## 2018-02-04 MED ORDER — HYDROCORTISONE 0.5 % EX CREA: 1.0000 "application " | TOPICAL_CREAM | Freq: Two times a day (BID) | CUTANEOUS | 2 refills | Status: DC

## 2018-02-04 MED ORDER — OLANZAPINE 10 MG PO TABS: 10.0000 mg | ORAL_TABLET | Freq: Every day | ORAL | 1 refills | Status: DC

## 2018-02-04 MED ORDER — FLUTICASONE PROPIONATE 50 MCG/ACT NA SUSP: 2.0000 | Freq: Every day | NASAL | 6 refills | Status: DC

## 2018-02-04 MED ORDER — GABAPENTIN 300 MG PO CAPS: 300.0000 mg | ORAL_CAPSULE | Freq: Three times a day (TID) | ORAL | 3 refills | Status: DC

## 2018-02-04 MED ORDER — MECLIZINE HCL 25 MG PO TABS: 25.0000 mg | ORAL_TABLET | Freq: Three times a day (TID) | ORAL | 0 refills | Status: DC | PRN

## 2018-02-04 MED FILL — FLUTICASONE PROP 50 MCG SPR: 50 | 30 days supply | Qty: 16 | Fill #0

## 2018-02-04 MED FILL — GABAPENTIN 300 MG CAPSULE: 300 | 30 days supply | Qty: 90 | Fill #0

## 2018-02-04 MED FILL — OLANZapine 10 MG TABS: 10 | 30 days supply | Qty: 30 | Fill #0

## 2018-02-04 MED FILL — IBUPROFEN 800 MG TABLET: 800 | 10 days supply | Qty: 30 | Fill #0

## 2018-02-04 MED FILL — MECLIZINE 25 MG TABLET: 25 | 10 days supply | Qty: 30 | Fill #0

## 2018-02-04 MED FILL — ALBUTEROL SULFATE HFA 108 (: 108 (90 BAS | 25 days supply | Qty: 18 | Fill #0
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
## 2018-02-05 ENCOUNTER — Other Ambulatory Visit: Payer: Self-pay | Admitting: Family Medicine

## 2018-02-05 DIAGNOSIS — R768 Other specified abnormal immunological findings in serum: Secondary | ICD-10-CM

## 2018-02-05 DIAGNOSIS — E559 Vitamin D deficiency, unspecified: Secondary | ICD-10-CM

## 2018-02-05 DIAGNOSIS — R7689 Other specified abnormal immunological findings in serum: Secondary | ICD-10-CM

## 2018-02-05 MED ORDER — VITAMIN D (ERGOCALCIFEROL) 1.25 MG (50000 UNIT) PO CAPS: 50000.0000 [IU] | ORAL_CAPSULE | ORAL | 2 refills | Status: DC

## 2018-02-05 NOTE — Progress Notes (Signed)
Rx for Vitamin D sent to pharmacy today. Patient referred to Infection Disease today.

## 2018-02-06 ENCOUNTER — Other Ambulatory Visit: Payer: Self-pay

## 2018-02-06 LAB — COMPREHENSIVE METABOLIC PANEL
ALT: 26 IU/L (ref 0–32)
AST: 29 IU/L (ref 0–40)
Albumin/Globulin Ratio: 1.4 (ref 1.2–2.2)
Albumin: 4.2 g/dL (ref 3.5–5.5)
Alkaline Phosphatase: 68 IU/L (ref 39–117)
BUN/Creatinine Ratio: 8 — ABNORMAL LOW (ref 9–23)
BUN: 7 mg/dL (ref 6–24)
Bilirubin Total: 0.3 mg/dL (ref 0.0–1.2)
CO2: 21 mmol/L (ref 20–29)
Calcium: 9.3 mg/dL (ref 8.7–10.2)
Chloride: 101 mmol/L (ref 96–106)
Creatinine, Ser: 0.91 mg/dL (ref 0.57–1.00)
GFR calc Af Amer: 86 mL/min/{1.73_m2} (ref >59)
GFR calc non Af Amer: 74 mL/min/{1.73_m2} (ref >59)
Globulin, Total: 2.9 g/dL (ref 1.5–4.5)
Glucose: 73 mg/dL (ref 65–99)
Potassium: 3.8 mmol/L (ref 3.5–5.2)
Sodium: 139 mmol/L (ref 134–144)
Total Protein: 7.1 g/dL (ref 6.0–8.5)

## 2018-02-06 LAB — HEPB+HEPC+HIV PANEL
HIV Screen 4th Generation wRfx: NONREACTIVE
Hep B C IgM: NEGATIVE
Hep B Core Total Ab: NEGATIVE
Hep B E Ab: NEGATIVE
Hep B E Ag: NEGATIVE
Hep B Surface Ab, Qual: NONREACTIVE
Hep C Virus Ab: >11 s/co ratio — ABNORMAL HIGH (ref 0.0–0.9)
Hepatitis B Surface Ag: NEGATIVE

## 2018-02-06 LAB — LIPID PANEL
Chol/HDL Ratio: 4 ratio (ref 0.0–4.4)
Cholesterol, Total: 197 mg/dL (ref 100–199)
HDL: 49 mg/dL (ref >39)
LDL Calculated: 132 mg/dL — ABNORMAL HIGH (ref 0–99)
Triglycerides: 81 mg/dL (ref 0–149)
VLDL Cholesterol Cal: 16 mg/dL (ref 5–40)

## 2018-02-06 LAB — CBC WITH DIFFERENTIAL/PLATELET
Basophils Absolute: 0 10*3/uL (ref 0.0–0.2)
Basos: 1 %
EOS (ABSOLUTE): 0.1 10*3/uL (ref 0.0–0.4)
Eos: 2 %
Hematocrit: 40.7 % (ref 34.0–46.6)
Hemoglobin: 13.3 g/dL (ref 11.1–15.9)
Immature Grans (Abs): 0 10*3/uL (ref 0.0–0.1)
Immature Granulocytes: 0 %
Lymphocytes Absolute: 1.9 10*3/uL (ref 0.7–3.1)
Lymphs: 31 %
MCH: 26.8 pg (ref 26.6–33.0)
MCHC: 32.7 g/dL (ref 31.5–35.7)
MCV: 82 fL (ref 79–97)
Monocytes Absolute: 0.6 10*3/uL (ref 0.1–0.9)
Monocytes: 10 %
Neutrophils Absolute: 3.4 10*3/uL (ref 1.4–7.0)
Neutrophils: 56 %
Platelets: 223 10*3/uL (ref 150–450)
RBC: 4.96 x10E6/uL (ref 3.77–5.28)
RDW: 12.9 % (ref 12.3–15.4)
WBC: 6.1 10*3/uL (ref 3.4–10.8)

## 2018-02-06 LAB — VITAMIN D 25 HYDROXY (VIT D DEFICIENCY, FRACTURES): Vit D, 25-Hydroxy: 22.2 ng/mL — ABNORMAL LOW (ref 30.0–100.0)

## 2018-02-06 LAB — TSH: TSH: 2.18 u[IU]/mL (ref 0.450–4.500)

## 2018-02-06 LAB — URINE CULTURE

## 2018-02-06 LAB — VITAMIN B12: Vitamin B-12: 631 pg/mL (ref 232–1245)

## 2018-02-06 MED ORDER — CETIRIZINE HCL 10 MG PO TABS: 10.0000 mg | ORAL_TABLET | Freq: Every day | ORAL | 11 refills | Status: DC

## 2018-02-06 MED FILL — VIT D2 1.25 MG (50,000 UNIT: 1.25 MG | 35 days supply | Qty: 5 | Fill #0

## 2018-02-06 NOTE — Telephone Encounter (Signed)
-----   Message from Azzie Glatter, [place] sent at 02/05/2018 11:38 PM EST ----- Regarding: "Lab Results" Linda Cervantes,   Please inform patient that he has a low Vitamin D level. We recommend that he takes a daily Vitamin D supplement of 50,000 IUs daily to increase vitamin d levels.   We also sent referral to Infection Disease for positive Hep C results.     Thank you.

## 2018-02-06 NOTE — Telephone Encounter (Signed)
Patient notified and will start medication

## 2018-02-06 NOTE — Telephone Encounter (Signed)
Left a vm for patient to callback 

## 2018-03-04 ENCOUNTER — Encounter: Payer: Self-pay | Admitting: Family

## 2018-03-07 ENCOUNTER — Ambulatory Visit: Payer: Self-pay | Admitting: Family Medicine

## 2018-03-14 ENCOUNTER — Ambulatory Visit: Payer: Self-pay | Admitting: Family Medicine

## 2018-04-02 DIAGNOSIS — Z79899 Other long term (current) drug therapy: Secondary | ICD-10-CM | POA: Insufficient documentation

## 2018-04-02 DIAGNOSIS — R0789 Other chest pain: Secondary | ICD-10-CM | POA: Insufficient documentation

## 2018-04-02 DIAGNOSIS — Y939 Activity, unspecified: Secondary | ICD-10-CM | POA: Insufficient documentation

## 2018-04-02 DIAGNOSIS — Y92013 Bedroom of single-family (private) house as the place of occurrence of the external cause: Secondary | ICD-10-CM | POA: Insufficient documentation

## 2018-04-02 DIAGNOSIS — W01190A Fall on same level from slipping, tripping and stumbling with subsequent striking against furniture, initial encounter: Secondary | ICD-10-CM | POA: Insufficient documentation

## 2018-04-02 DIAGNOSIS — Y999 Unspecified external cause status: Secondary | ICD-10-CM | POA: Insufficient documentation

## 2018-04-02 DIAGNOSIS — F17210 Nicotine dependence, cigarettes, uncomplicated: Secondary | ICD-10-CM | POA: Insufficient documentation

## 2018-04-03 ENCOUNTER — Encounter (HOSPITAL_COMMUNITY): Payer: Self-pay

## 2018-04-03 ENCOUNTER — Emergency Department (HOSPITAL_COMMUNITY)
Admission: EM | Admit: 2018-04-03 | Discharge: 2018-04-03 | Disposition: A | Discharge status: Home / Self Care | Payer: Self-pay | Attending: Emergency Medicine | Admitting: Emergency Medicine

## 2018-04-03 ENCOUNTER — Emergency Department (HOSPITAL_COMMUNITY): Payer: Self-pay

## 2018-04-03 DIAGNOSIS — R0781 Pleurodynia: Secondary | ICD-10-CM

## 2018-04-03 DIAGNOSIS — W19XXXA Unspecified fall, initial encounter: Secondary | ICD-10-CM

## 2018-04-03 IMAGING — DX DG RIBS W/ CHEST 3+V*R*
3 series · 3 of 3 positions shown · non-contrast
Comparison: Chest radiograph performed [DATE]

CLINICAL DATA: Status post fall, with injury to the right side of
the chest on dresser. Right rib pain with movement and breathing.
Initial encounter.

EXAM:
RIGHT RIBS AND CHEST - 3+ VIEW

[chest pa]
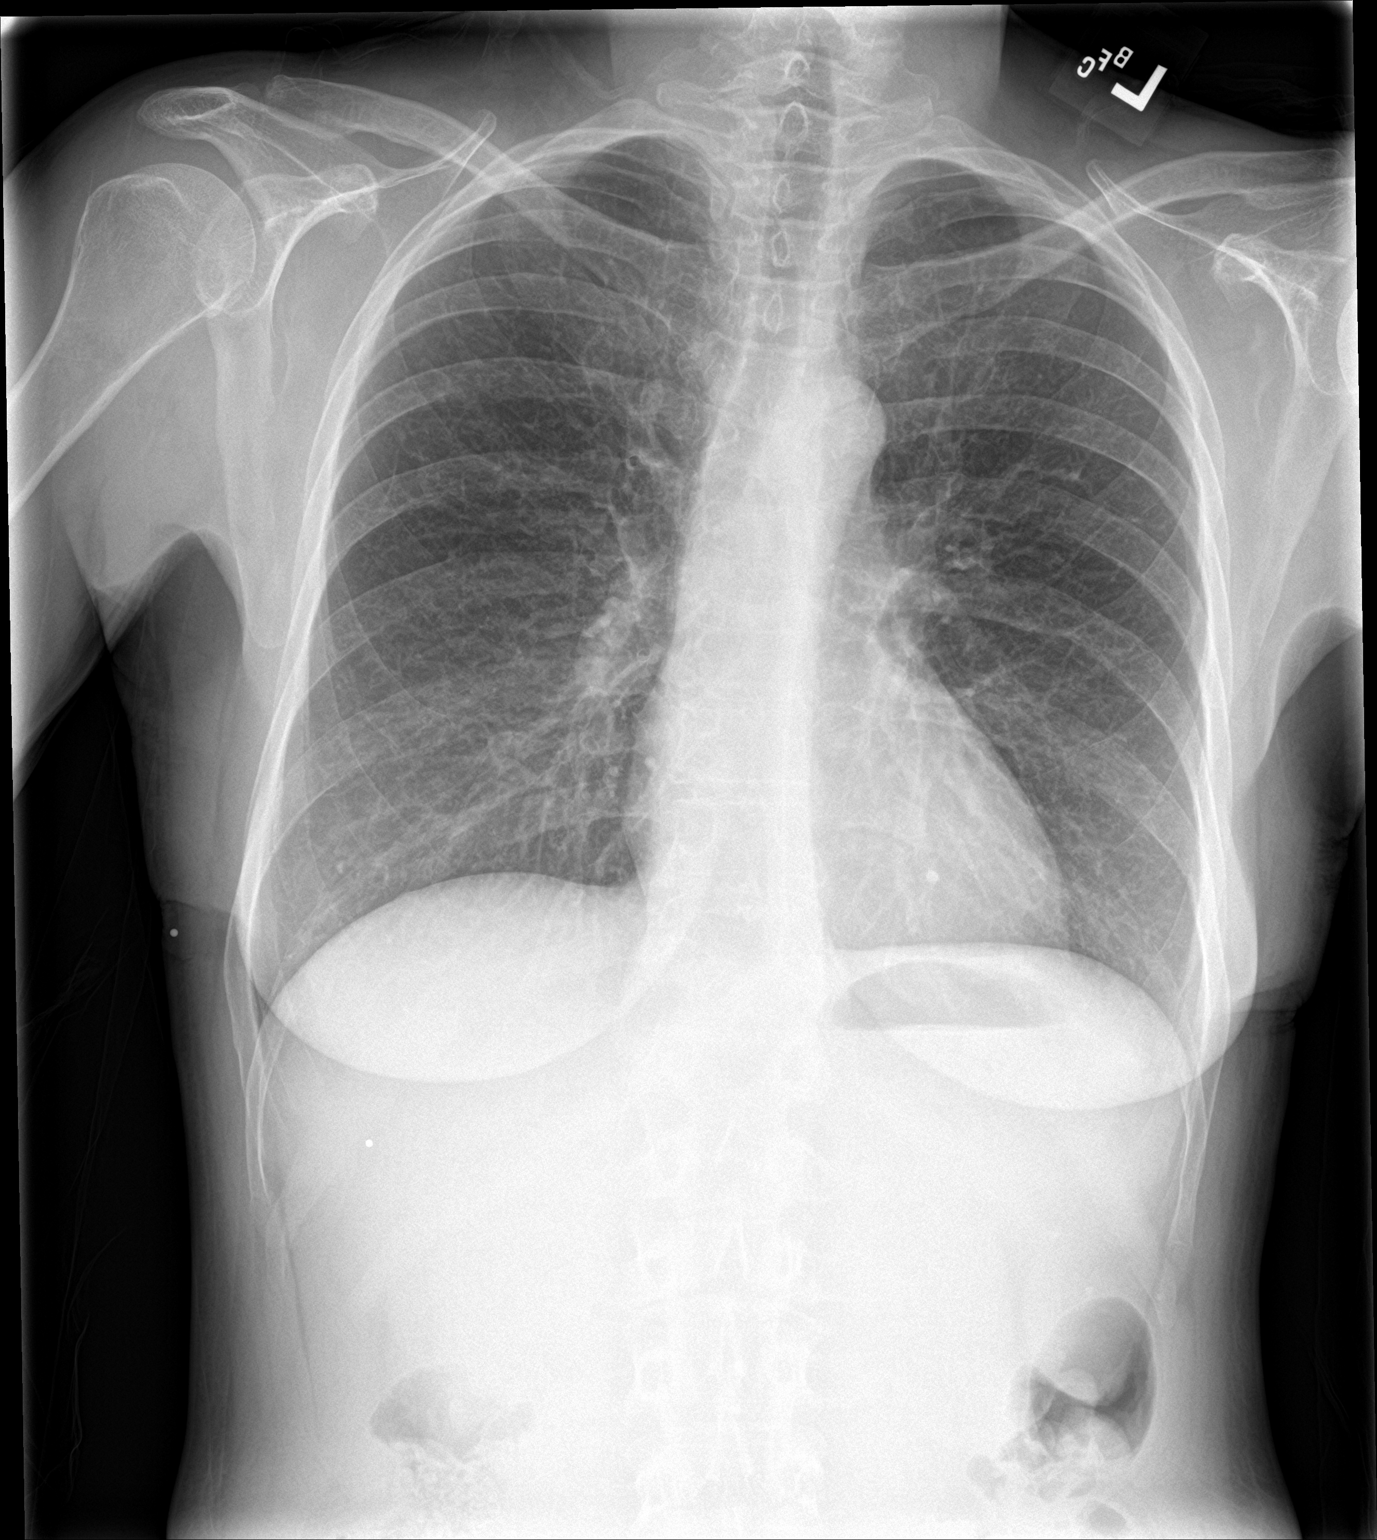

[rib pa]
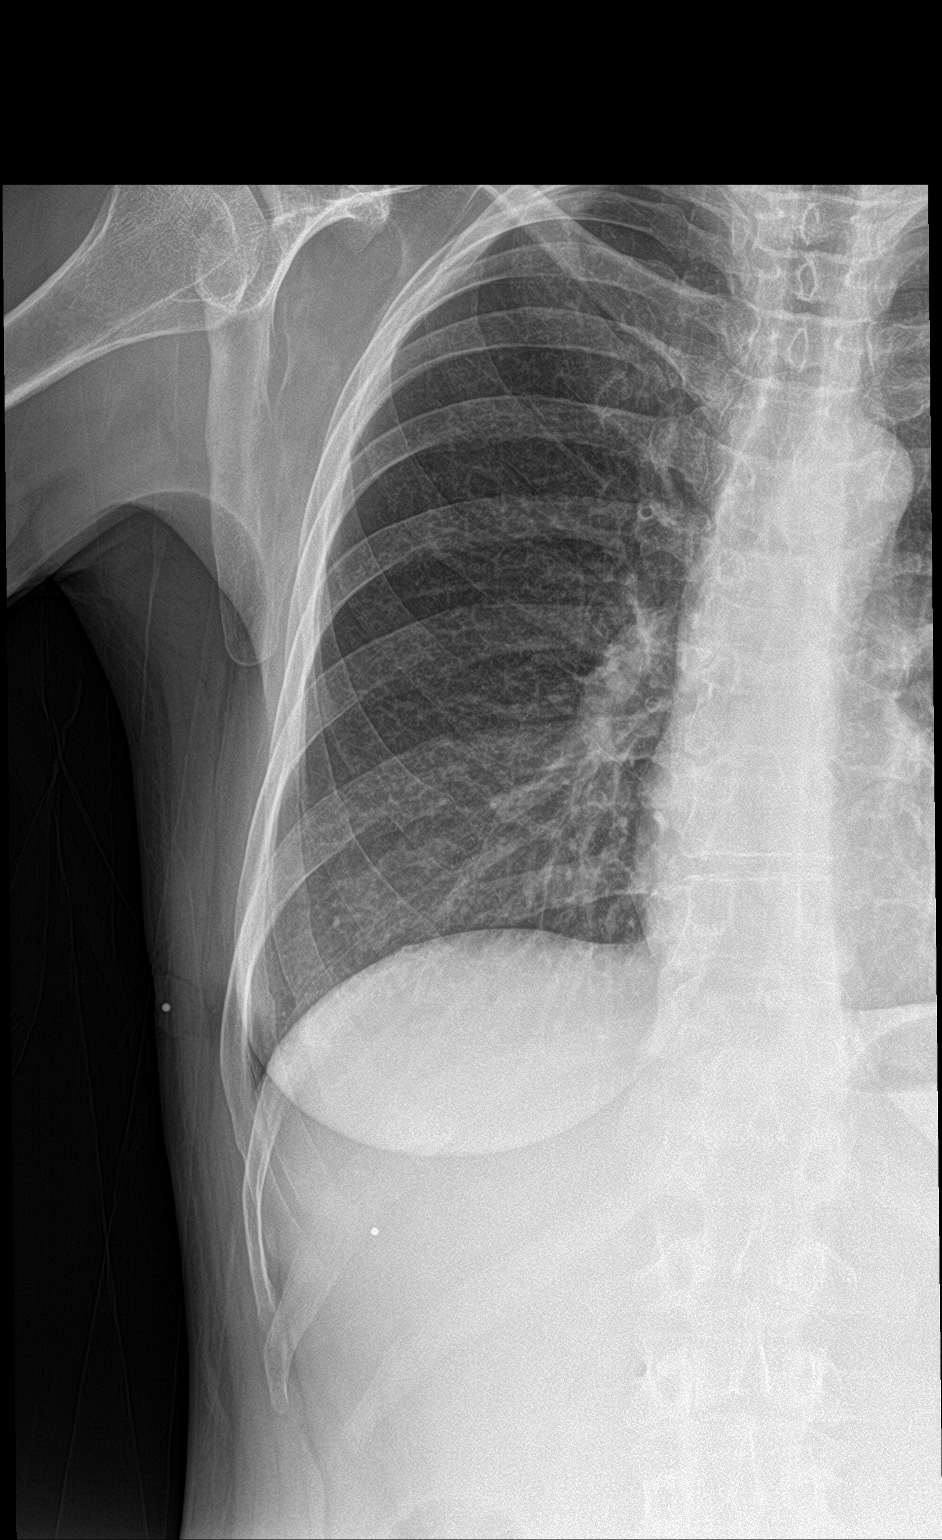

[rib pa obl]
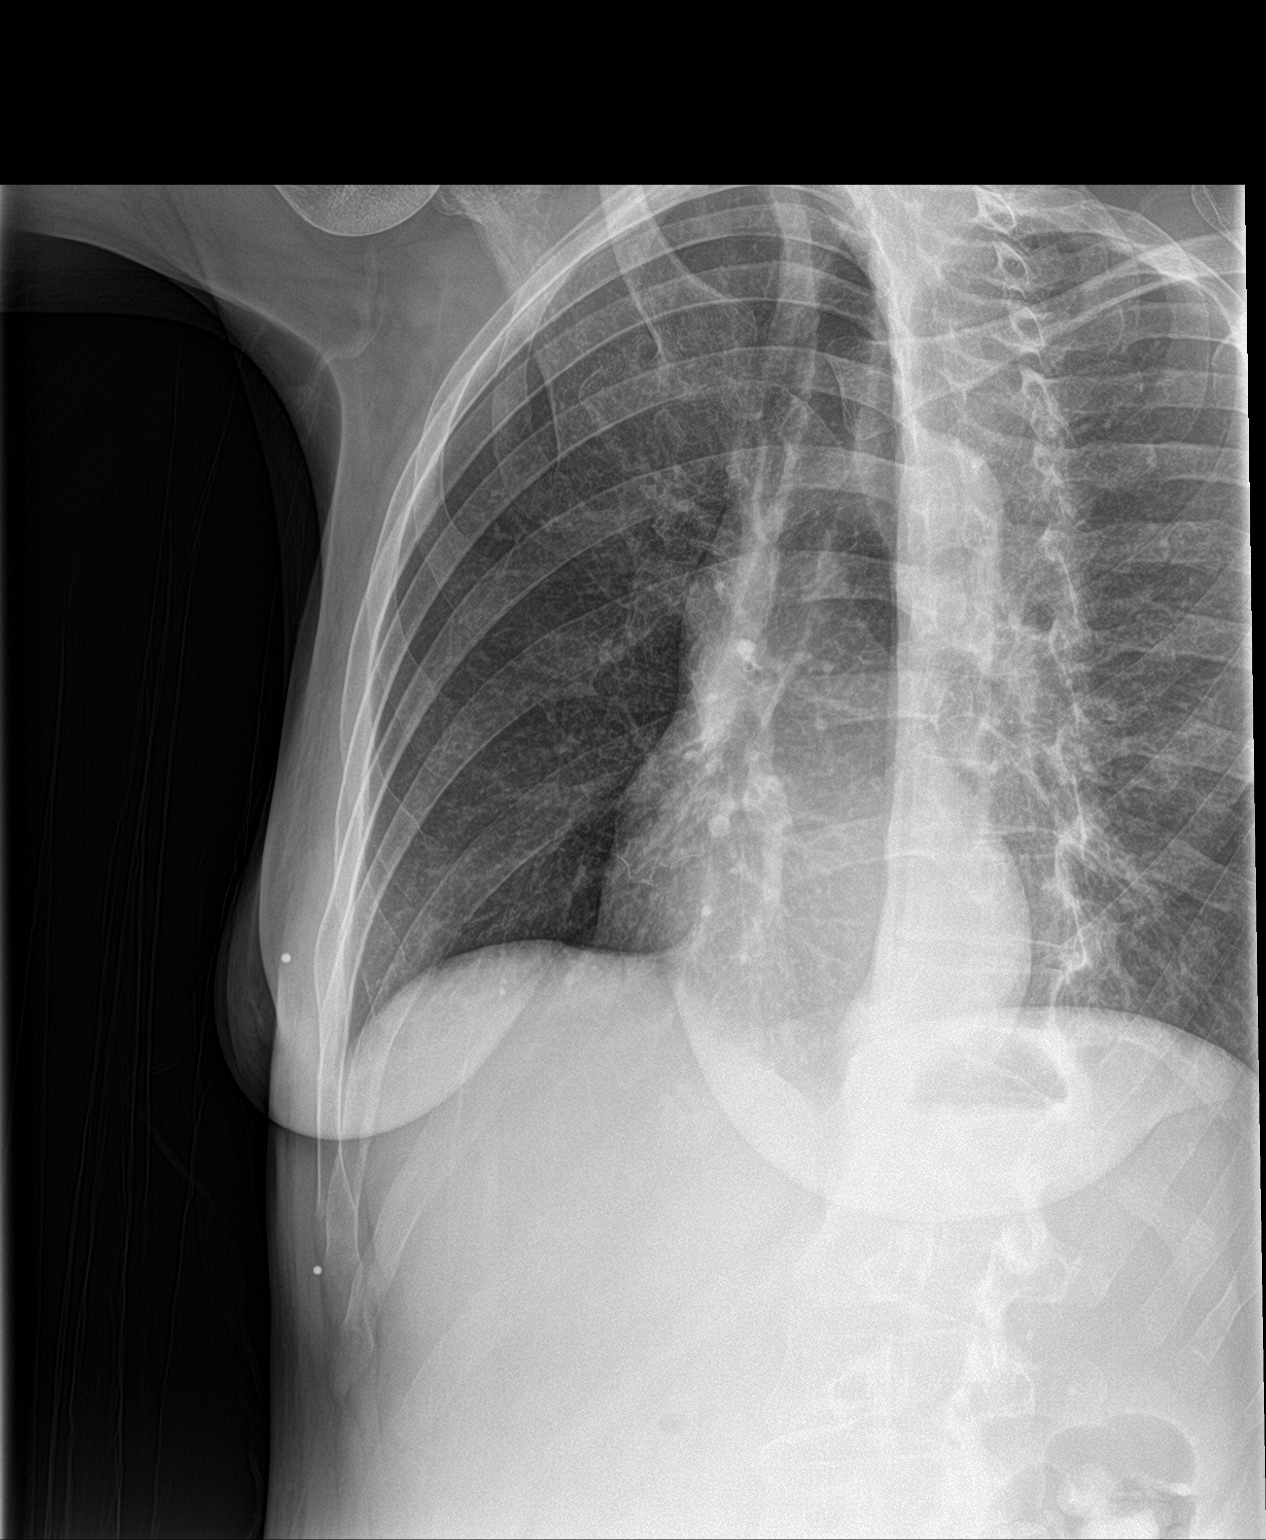

[3 of 3 positions shown; findings below may reference images not displayed]

FINDINGS: No displaced rib fractures are seen. Slight deformity of multiple
right-sided ribs is thought to be chronic in nature.

The lungs are well-aerated and clear. There is no evidence of focal
opacification, pleural effusion or pneumothorax.

The cardiomediastinal silhouette is within normal limits. No acute
osseous abnormalities are seen.
IMPRESSION: No displaced rib fracture seen. No acute cardiopulmonary process
identified.

## 2018-04-03 MED ORDER — MELOXICAM 7.5 MG PO TABS
15.0000 mg | ORAL_TABLET | Freq: Once | ORAL | Status: AC
  Administered 2018-04-03: 15 mg via ORAL
  Filled 2018-04-03: qty 2

## 2018-04-03 MED ORDER — MELOXICAM 7.5 MG PO TABS: 15.0000 mg | ORAL_TABLET | Freq: Every day | ORAL | 0 refills | Status: DC

## 2018-04-03 NOTE — ED Notes (Signed)
Drank slushie could not get a temp

## 2018-04-03 NOTE — Discharge Instructions (Signed)
Take the prescribed medication as directed.  Continue using incentive spirometer as shown here. Follow-up with your primary care doctor. Return to the ED for new or worsening symptoms.

## 2018-04-03 NOTE — ED Notes (Signed)
Pt discharged from ED; instructions provided and scripts given; Pt encouraged to return to ED if symptoms worsen and to f/u with PCP; Pt verbalized understanding of all instructions 

## 2018-04-03 NOTE — ED Triage Notes (Signed)
Pt states that she had a mechanical fall today and fell onto her R rib cage on a night stand, hurts to take a deep breath, no distress noted
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
## 2018-04-09 ENCOUNTER — Ambulatory Visit (HOSPITAL_COMMUNITY): Payer: Self-pay | Admitting: Psychiatry

## 2018-04-15 ENCOUNTER — Ambulatory Visit (INDEPENDENT_AMBULATORY_CARE_PROVIDER_SITE_OTHER): Payer: Self-pay | Admitting: Family Medicine

## 2018-04-15 ENCOUNTER — Encounter: Payer: Self-pay | Admitting: Family Medicine

## 2018-04-15 VITALS — BP 126/74 | HR 88 | Temp 98.4°F | Ht 63.0 in | Wt 117.0 lb

## 2018-04-15 DIAGNOSIS — H9393 Unspecified disorder of ear, bilateral: Secondary | ICD-10-CM

## 2018-04-15 DIAGNOSIS — R42 Dizziness and giddiness: Secondary | ICD-10-CM

## 2018-04-15 DIAGNOSIS — M542 Cervicalgia: Secondary | ICD-10-CM

## 2018-04-15 DIAGNOSIS — F419 Anxiety disorder, unspecified: Secondary | ICD-10-CM

## 2018-04-15 DIAGNOSIS — H6982 Other specified disorders of Eustachian tube, left ear: Secondary | ICD-10-CM

## 2018-04-15 DIAGNOSIS — H6992 Unspecified Eustachian tube disorder, left ear: Secondary | ICD-10-CM

## 2018-04-15 DIAGNOSIS — Z09 Encounter for follow-up examination after completed treatment for conditions other than malignant neoplasm: Secondary | ICD-10-CM

## 2018-04-15 MED ORDER — MECLIZINE HCL 25 MG PO TABS: 25.0000 mg | ORAL_TABLET | Freq: Three times a day (TID) | ORAL | 3 refills | Status: DC | PRN

## 2018-04-15 MED ORDER — MELOXICAM 7.5 MG PO TABS: 15.0000 mg | ORAL_TABLET | Freq: Every day | ORAL | 3 refills | Status: DC

## 2018-04-15 MED ORDER — OLANZAPINE 10 MG PO TABS: 10.0000 mg | ORAL_TABLET | Freq: Every day | ORAL | 3 refills | Status: DC

## 2018-04-15 MED ORDER — IBUPROFEN 800 MG PO TABS: 800.0000 mg | ORAL_TABLET | Freq: Three times a day (TID) | ORAL | 3 refills | Status: DC | PRN

## 2018-04-15 NOTE — Progress Notes (Signed)
Hospital Follow Up  Subjective:    Patient ID: Linda Cervantes, female    DOB: [birth year], 53 y.o.   MRN: [id number]  Chief Complaint  Patient presents with  . Follow-up    chronic condition    HPI  [name] is a year old female with a past medical history of Substance Abuse, Heart Murmur, GERD, Endometriosis, Depression, Back Pain, Asthma, and Anxiety. She is here today for hospital follow up.   Current Status: Since her last ED visit on 04/03/2018 for a fall, she continues to have pain on right upper flank area. She also has c/o chronic back pain, and reports dizziness. She rates her pain at 8/10. Scan revealed no fracture. She has been having transportation problems, and has missed several appointments. She has follow up appointment on 04/23/2018 with mental health. She continues to smoke 1 pack a day of cigarettes. She has problems with her both ears.  She denies fevers, chills, fatigue, recent infections, weight loss, and night sweats. She has not had any headaches, visual changes, dizziness, and falls. No chest pain, heart palpitations, cough and shortness of breath reported. No reports of GI problems such as nausea, vomiting, diarrhea, and constipation. She has no reports of blood in stools, dysuria and hematuria. No depression or anxiety, and denies suicidal ideations, homicidal ideations, or auditory hallucinations. She denies pain today.   Review of Systems  Constitutional: Negative.   HENT: Negative.   Eyes: Negative.   Respiratory: Negative.   Cardiovascular: Negative.   Gastrointestinal: Negative.   Endocrine: Negative.   Genitourinary: Negative.   Musculoskeletal: Negative.   Skin: Negative.   Allergic/Immunologic: Negative.   Neurological: Negative.   Hematological: Negative.   Psychiatric/Behavioral: Negative.    Objective:   Physical Exam Vitals signs and nursing note reviewed.  Constitutional:      Appearance: Normal appearance. She is normal weight.  HENT:   Head: Normocephalic and atraumatic.     Right Ear: External ear normal.     Left Ear: External ear normal.     Nose: Nose normal.     Mouth/Throat:     Mouth: Mucous membranes are moist.     Pharynx: Oropharynx is clear.  Eyes:     Conjunctiva/sclera: Conjunctivae normal.  Neck:     Musculoskeletal: Normal range of motion and neck supple.  Cardiovascular:     Rate and Rhythm: Normal rate and regular rhythm.     Pulses: Normal pulses.     Heart sounds: Normal heart sounds.  Pulmonary:     Effort: Pulmonary effort is normal.     Breath sounds: Normal breath sounds.  Abdominal:     General: Bowel sounds are normal.     Palpations: Abdomen is soft.  Musculoskeletal: Normal range of motion.  Skin:    General: Skin is warm.     Capillary Refill: Capillary refill takes less than 2 seconds.  Neurological:     General: No focal deficit present.     Mental Status: She is alert and oriented to person, place, and time.  Psychiatric:        Mood and Affect: Mood normal.        Behavior: Behavior normal.        Thought Content: Thought content normal.        Judgment: Judgment normal.    Assessment & Plan:   1. Eustachian tube dysfunction, left  2. Ear problems, bilateral - Ambulatory referral to ENT  3. Neck pain on left  side - ibuprofen (ADVIL,MOTRIN) 800 MG tablet; Take 1 tablet (800 mg total) by mouth every 8 (eight) hours as needed.  Dispense: 30 tablet; Refill: 3 - OLANZapine (ZYPREXA) 10 MG tablet; Take 1 tablet (10 mg total) by mouth at bedtime.  Dispense: 30 tablet; Refill: 3 - meloxicam (MOBIC) 7.5 MG tablet; Take 2 tablets (15 mg total) by mouth daily.  Dispense: 30 tablet; Refill: 3  4. Dizziness - meclizine (ANTIVERT) 25 MG tablet; Take 1 tablet (25 mg total) by mouth 3 (three) times daily as needed for dizziness.  Dispense: 30 tablet; Refill: 3  5. Anxiety Continue Zyprexa as prescribed.   6. Follow up He will follow up in 3 month. We will draw labs at next  office visit.    Meds ordered this encounter  Medications  . ibuprofen (ADVIL,MOTRIN) 800 MG tablet    Sig: Take 1 tablet (800 mg total) by mouth every 8 (eight) hours as needed.    Dispense:  30 tablet    Refill:  3  . meclizine (ANTIVERT) 25 MG tablet    Sig: Take 1 tablet (25 mg total) by mouth 3 (three) times daily as needed for dizziness.    Dispense:  30 tablet    Refill:  3  . DISCONTD: meloxicam (MOBIC) 7.5 MG tablet    Sig: Take 2 tablets (15 mg total) by mouth daily.    Dispense:  30 tablet    Refill:  3  . OLANZapine (ZYPREXA) 10 MG tablet    Sig: Take 1 tablet (10 mg total) by mouth at bedtime.    Dispense:  30 tablet    Refill:  3  . meloxicam (MOBIC) 7.5 MG tablet    Sig: Take 2 tablets (15 mg total) by mouth daily.    Dispense:  30 tablet    Refill:  3   Orders Placed This Encounter  Procedures  . Ambulatory referral to ENT    Referral Orders     Ambulatory referral to ENT    Kathe Becton,  MSN, FNP-C [facility] [address] [place], [place] [address] [phone]

## 2018-04-16 MED FILL — IBUPROFEN 800 MG TABLET: 800 | 10 days supply | Qty: 30 | Fill #0

## 2018-04-16 MED FILL — OLANZapine 10 MG TABS: 10 | 30 days supply | Qty: 30 | Fill #0

## 2018-04-16 MED FILL — MECLIZINE 25 MG TABLET: 25 | 10 days supply | Qty: 30 | Fill #0

## 2018-04-18 MED FILL — VIT D2 1.25 MG (50,000 UNIT: 1.25 MG | 35 days supply | Qty: 5 | Fill #1

## 2018-04-18 MED FILL — GABAPENTIN 300 MG CAPSULE: 300 | 30 days supply | Qty: 90 | Fill #1

## 2018-04-18 MED FILL — ALBUTEROL SULFATE HFA 108 (: 108 (90 BAS | 25 days supply | Qty: 18 | Fill #1

## 2018-04-18 MED FILL — FLUTICASONE PROP 50 MCG SPR: 50 | 30 days supply | Qty: 16 | Fill #1

## 2018-04-23 ENCOUNTER — Ambulatory Visit (HOSPITAL_COMMUNITY): Payer: Self-pay | Admitting: Psychiatry

## 2018-05-15 ENCOUNTER — Ambulatory Visit (INDEPENDENT_AMBULATORY_CARE_PROVIDER_SITE_OTHER): Payer: Self-pay | Admitting: Otolaryngology

## 2018-05-22 ENCOUNTER — Emergency Department (HOSPITAL_COMMUNITY): Payer: Self-pay

## 2018-05-22 ENCOUNTER — Ambulatory Visit (HOSPITAL_BASED_OUTPATIENT_CLINIC_OR_DEPARTMENT_OTHER): Payer: Self-pay | Admitting: Certified Registered"

## 2018-05-22 ENCOUNTER — Encounter (HOSPITAL_BASED_OUTPATIENT_CLINIC_OR_DEPARTMENT_OTHER): Payer: Self-pay | Admitting: Anesthesiology

## 2018-05-22 ENCOUNTER — Other Ambulatory Visit: Payer: Self-pay

## 2018-05-22 ENCOUNTER — Encounter (HOSPITAL_BASED_OUTPATIENT_CLINIC_OR_DEPARTMENT_OTHER): Payer: Self-pay

## 2018-05-22 ENCOUNTER — Encounter (HOSPITAL_COMMUNITY): Payer: Self-pay | Admitting: Emergency Medicine

## 2018-05-22 ENCOUNTER — Ambulatory Visit (HOSPITAL_BASED_OUTPATIENT_CLINIC_OR_DEPARTMENT_OTHER)
Admission: RE | Admit: 2018-05-22 | Discharge: 2018-05-22 | Disposition: A | Discharge status: Home / Self Care | Payer: Self-pay | Source: Ambulatory Visit | Attending: Orthopedic Surgery | Admitting: Orthopedic Surgery

## 2018-05-22 ENCOUNTER — Emergency Department (HOSPITAL_COMMUNITY)
Admission: RE | Admit: 2018-05-22 | Discharge: 2018-05-22 | Disposition: A | Discharge status: Home / Self Care | Payer: Self-pay | Attending: Emergency Medicine | Admitting: Emergency Medicine

## 2018-05-22 ENCOUNTER — Encounter (HOSPITAL_BASED_OUTPATIENT_CLINIC_OR_DEPARTMENT_OTHER)
Admission: RE | Disposition: A | Discharge status: Home / Self Care | Payer: Self-pay | Source: Ambulatory Visit | Attending: Orthopedic Surgery

## 2018-05-22 DIAGNOSIS — X58XXXA Exposure to other specified factors, initial encounter: Secondary | ICD-10-CM | POA: Insufficient documentation

## 2018-05-22 DIAGNOSIS — M795 Residual foreign body in soft tissue: Secondary | ICD-10-CM | POA: Insufficient documentation

## 2018-05-22 DIAGNOSIS — F419 Anxiety disorder, unspecified: Secondary | ICD-10-CM | POA: Insufficient documentation

## 2018-05-22 DIAGNOSIS — J45909 Unspecified asthma, uncomplicated: Secondary | ICD-10-CM | POA: Insufficient documentation

## 2018-05-22 DIAGNOSIS — F17210 Nicotine dependence, cigarettes, uncomplicated: Secondary | ICD-10-CM | POA: Insufficient documentation

## 2018-05-22 DIAGNOSIS — F1120 Opioid dependence, uncomplicated: Secondary | ICD-10-CM | POA: Insufficient documentation

## 2018-05-22 DIAGNOSIS — Z791 Long term (current) use of non-steroidal anti-inflammatories (NSAID): Secondary | ICD-10-CM | POA: Insufficient documentation

## 2018-05-22 DIAGNOSIS — Z79899 Other long term (current) drug therapy: Secondary | ICD-10-CM | POA: Insufficient documentation

## 2018-05-22 DIAGNOSIS — F329 Major depressive disorder, single episode, unspecified: Secondary | ICD-10-CM | POA: Insufficient documentation

## 2018-05-22 DIAGNOSIS — G4700 Insomnia, unspecified: Secondary | ICD-10-CM | POA: Insufficient documentation

## 2018-05-22 DIAGNOSIS — S51841A Puncture wound with foreign body of right forearm, initial encounter: Secondary | ICD-10-CM | POA: Insufficient documentation

## 2018-05-22 DIAGNOSIS — Z189 Retained foreign body fragments, unspecified material: Secondary | ICD-10-CM

## 2018-05-22 DIAGNOSIS — K219 Gastro-esophageal reflux disease without esophagitis: Secondary | ICD-10-CM | POA: Insufficient documentation

## 2018-05-22 HISTORY — PX: INCISION AND DRAINAGE OF WOUND: SHX1803

## 2018-05-22 IMAGING — DX DG FOREARM 2V*R*
2 series · 2 of 2 positions shown · non-contrast
Comparison: None.

CLINICAL DATA: Needle broken often forearm

EXAM:
RIGHT FOREARM - 2 VIEW

[forearm ap]
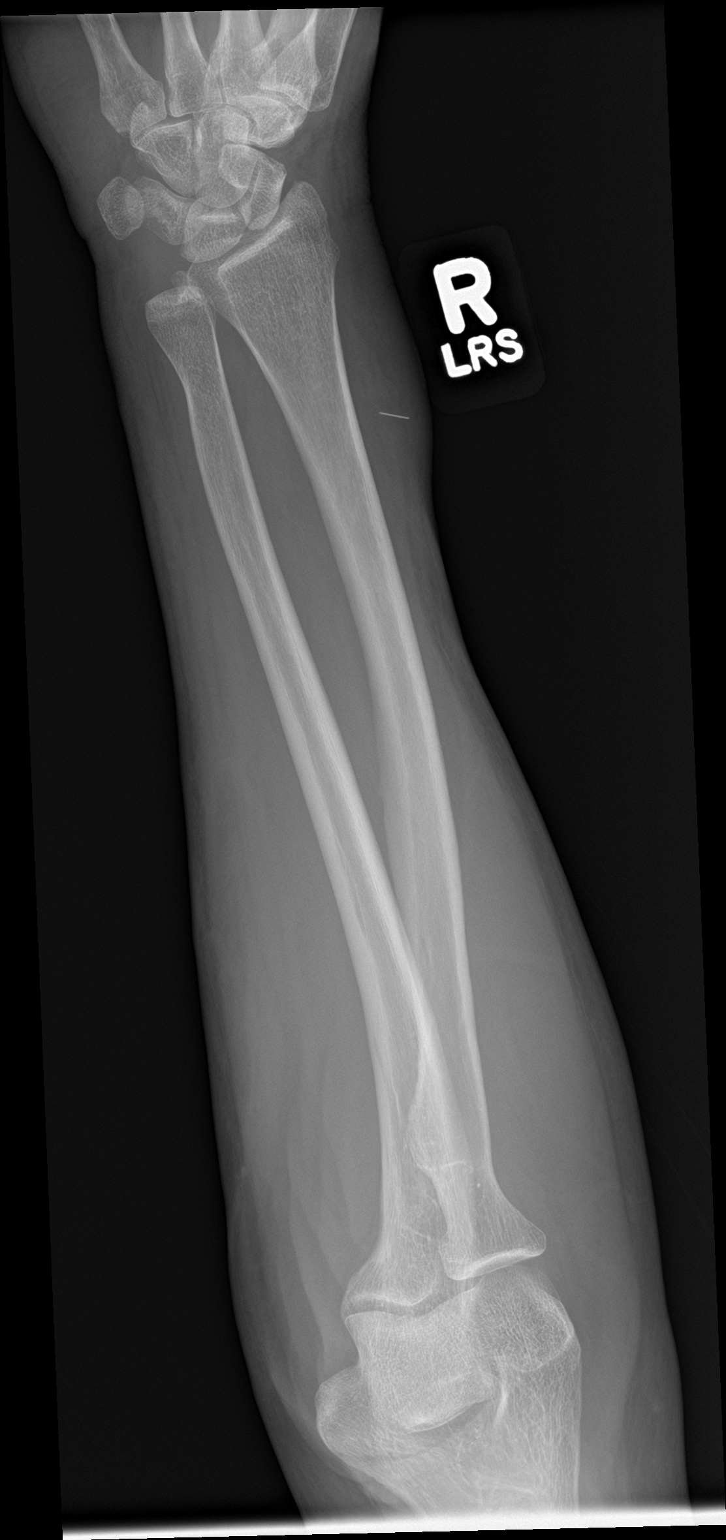

[forearm lat]
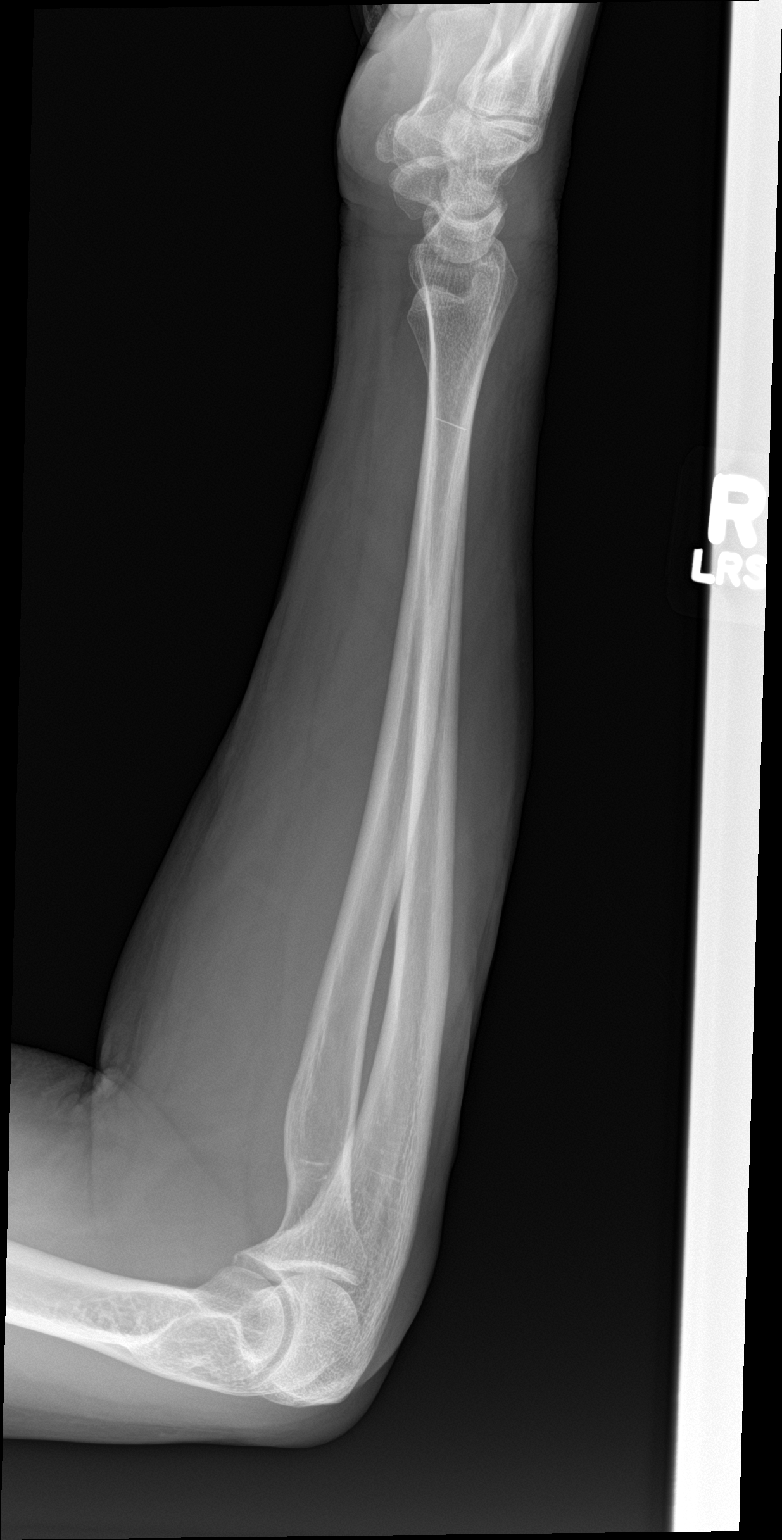

[2 of 2 positions shown; findings below may reference images not displayed]

FINDINGS: There is a linear foreign body within the lateral soft tissues of
the distal right forearm the optic measures approximately 7 mm on
both AP and lateral views, indicating that it is actually longer.
The needle is 5 mm from the lateral skin surface. There is moderate
soft tissue swelling.
IMPRESSION: Radiopaque foreign body within the lateral soft tissues of the
distal right forearm.

## 2018-05-22 SURGERY — IRRIGATION AND DEBRIDEMENT WOUND
Anesthesia: General | Site: Arm Lower | Laterality: Right

## 2018-05-22 MED ORDER — DEXAMETHASONE SODIUM PHOSPHATE 10 MG/ML IJ SOLN
INTRAMUSCULAR | Status: DC | PRN
  Administered 2018-05-22: 10 mg via INTRAVENOUS

## 2018-05-22 MED ORDER — LIDOCAINE 2% (20 MG/ML) 5 ML SYRINGE
INTRAMUSCULAR | Status: AC
  Filled 2018-05-22: qty 5

## 2018-05-22 MED ORDER — CEFAZOLIN SODIUM-DEXTROSE 2-4 GM/100ML-% IV SOLN
INTRAVENOUS | Status: AC
  Filled 2018-05-22: qty 100

## 2018-05-22 MED ORDER — PHENYLEPHRINE 40 MCG/ML (10ML) SYRINGE FOR IV PUSH (FOR BLOOD PRESSURE SUPPORT)
PREFILLED_SYRINGE | INTRAVENOUS | Status: AC
  Filled 2018-05-22: qty 10

## 2018-05-22 MED ORDER — SCOPOLAMINE 1 MG/3DAYS TD PT72: 1.0000 | MEDICATED_PATCH | Freq: Once | TRANSDERMAL | Status: DC | PRN

## 2018-05-22 MED ORDER — CEPHALEXIN 250 MG PO CAPS
500.0000 mg | ORAL_CAPSULE | Freq: Once | ORAL | Status: AC
  Administered 2018-05-22: 500 mg via ORAL
  Filled 2018-05-22: qty 2

## 2018-05-22 MED ORDER — FENTANYL CITRATE (PF) 100 MCG/2ML IJ SOLN: 25.0000 ug | INTRAMUSCULAR | Status: DC | PRN

## 2018-05-22 MED ORDER — POVIDONE-IODINE 10 % EX SWAB: 2.0000 "application " | Freq: Once | CUTANEOUS | Status: DC

## 2018-05-22 MED ORDER — LIDOCAINE-EPINEPHRINE 2 %-1:100000 IJ SOLN
10.0000 mL | Freq: Once | INTRAMUSCULAR | Status: AC
  Administered 2018-05-22: 10 mL via INTRADERMAL
  Filled 2018-05-22: qty 10

## 2018-05-22 MED ORDER — PROPOFOL 10 MG/ML IV BOLUS
INTRAVENOUS | Status: DC | PRN
  Administered 2018-05-22: 160 mg via INTRAVENOUS

## 2018-05-22 MED ORDER — ACETAMINOPHEN 10 MG/ML IV SOLN
INTRAVENOUS | Status: AC
  Filled 2018-05-22: qty 100

## 2018-05-22 MED ORDER — ONDANSETRON HCL 4 MG/2ML IJ SOLN
INTRAMUSCULAR | Status: DC | PRN
  Administered 2018-05-22: 4 mg via INTRAVENOUS

## 2018-05-22 MED ORDER — ONDANSETRON HCL 4 MG/2ML IJ SOLN: 4.0000 mg | Freq: Once | INTRAMUSCULAR | Status: DC | PRN

## 2018-05-22 MED ORDER — EPHEDRINE 5 MG/ML INJ
INTRAVENOUS | Status: AC
  Filled 2018-05-22: qty 10

## 2018-05-22 MED ORDER — CHLORHEXIDINE GLUCONATE 4 % EX LIQD: 60.0000 mL | Freq: Once | CUTANEOUS | Status: DC

## 2018-05-22 MED ORDER — CEFAZOLIN SODIUM-DEXTROSE 2-4 GM/100ML-% IV SOLN
2.0000 g | INTRAVENOUS | Status: AC
  Administered 2018-05-22: 2 g via INTRAVENOUS

## 2018-05-22 MED ORDER — HYDROCODONE-ACETAMINOPHEN 5-325 MG PO TABS: ORAL_TABLET | ORAL | 0 refills | Status: DC

## 2018-05-22 MED ORDER — LIDOCAINE 2% (20 MG/ML) 5 ML SYRINGE
INTRAMUSCULAR | Status: DC | PRN
  Administered 2018-05-22: 30 mg via INTRAVENOUS

## 2018-05-22 MED ORDER — LACTATED RINGERS IV SOLN
INTRAVENOUS | Status: DC
  Administered 2018-05-22: 16:00:00 via INTRAVENOUS

## 2018-05-22 MED ORDER — ACETAMINOPHEN 10 MG/ML IV SOLN
1000.0000 mg | Freq: Once | INTRAVENOUS | Status: AC
  Administered 2018-05-22: 1000 mg via INTRAVENOUS

## 2018-05-22 MED ORDER — CLONIDINE HCL 0.2 MG PO TABS
0.2000 mg | ORAL_TABLET | Freq: Once | ORAL | Status: AC
  Administered 2018-05-22: 0.2 mg via ORAL
  Filled 2018-05-22: qty 1

## 2018-05-22 MED ORDER — ONDANSETRON HCL 4 MG/2ML IJ SOLN
INTRAMUSCULAR | Status: AC
  Filled 2018-05-22: qty 2

## 2018-05-22 MED ORDER — KETOROLAC TROMETHAMINE 30 MG/ML IJ SOLN
30.0000 mg | Freq: Once | INTRAMUSCULAR | Status: AC
  Administered 2018-05-22: 30 mg via INTRAVENOUS

## 2018-05-22 MED ORDER — KETOROLAC TROMETHAMINE 30 MG/ML IJ SOLN
INTRAMUSCULAR | Status: AC
  Filled 2018-05-22: qty 1

## 2018-05-22 MED ORDER — DEXAMETHASONE SODIUM PHOSPHATE 10 MG/ML IJ SOLN
INTRAMUSCULAR | Status: AC
  Filled 2018-05-22: qty 1

## 2018-05-22 MED ORDER — BUPIVACAINE HCL (PF) 0.25 % IJ SOLN
INTRAMUSCULAR | Status: DC | PRN
  Administered 2018-05-22: 6 mL

## 2018-05-22 MED ORDER — FENTANYL CITRATE (PF) 100 MCG/2ML IJ SOLN
INTRAMUSCULAR | Status: AC
  Filled 2018-05-22: qty 2

## 2018-05-22 MED ORDER — PROPOFOL 10 MG/ML IV BOLUS
INTRAVENOUS | Status: AC
  Filled 2018-05-22: qty 20

## 2018-05-22 MED ORDER — EPHEDRINE SULFATE 50 MG/ML IJ SOLN
INTRAMUSCULAR | Status: DC | PRN
  Administered 2018-05-22 (×2): 10 mg via INTRAVENOUS

## 2018-05-22 MED ORDER — PHENYLEPHRINE HCL 10 MG/ML IJ SOLN
INTRAMUSCULAR | Status: DC | PRN
  Administered 2018-05-22: 40 ug via INTRAVENOUS

## 2018-05-22 SURGICAL SUPPLY — 49 items
BAG DECANTER FOR FLEXI CONT (MISCELLANEOUS) IMPLANT
BANDAGE ACE 3X5.8 VEL STRL LF (GAUZE/BANDAGES/DRESSINGS) ×3 IMPLANT
BLADE MINI RND TIP GREEN BEAV (BLADE) IMPLANT
BLADE SURG 15 STRL LF DISP TIS (BLADE) ×2 IMPLANT
BLADE SURG 15 STRL SS (BLADE) ×4
BNDG COHESIVE 1X5 TAN STRL LF (GAUZE/BANDAGES/DRESSINGS) IMPLANT
BNDG ELASTIC 2X5.8 VLCR STR LF (GAUZE/BANDAGES/DRESSINGS) IMPLANT
BNDG ESMARK 4X9 LF (GAUZE/BANDAGES/DRESSINGS) ×3 IMPLANT
BNDG GAUZE 1X2.1 STRL (MISCELLANEOUS) IMPLANT
BNDG GAUZE ELAST 4 BULKY (GAUZE/BANDAGES/DRESSINGS) ×3 IMPLANT
CHLORAPREP W/TINT 26ML (MISCELLANEOUS) IMPLANT
CORD BIPOLAR FORCEPS 12FT (ELECTRODE) ×3 IMPLANT
COVER BACK TABLE 60X90IN (DRAPES) ×3 IMPLANT
COVER MAYO STAND STRL (DRAPES) ×3 IMPLANT
COVER WAND RF STERILE (DRAPES) IMPLANT
CUFF TOURNIQUET SINGLE 18IN (TOURNIQUET CUFF) ×3 IMPLANT
DRAPE EXTREMITY T 121X128X90 (DISPOSABLE) ×3 IMPLANT
DRAPE SURG 17X23 STRL (DRAPES) ×3 IMPLANT
GAUZE PACKING IODOFORM 1/4X15 (GAUZE/BANDAGES/DRESSINGS) IMPLANT
GAUZE PACKING IODOFORM 1/4X5 (PACKING) ×3 IMPLANT
GAUZE SPONGE 4X4 12PLY STRL (GAUZE/BANDAGES/DRESSINGS) ×3 IMPLANT
GAUZE XEROFORM 1X8 LF (GAUZE/BANDAGES/DRESSINGS) ×3 IMPLANT
GLOVE BIO SURGEON STRL SZ7.5 (GLOVE) ×6 IMPLANT
GLOVE BIOGEL PI IND STRL 8 (GLOVE) ×3 IMPLANT
GLOVE BIOGEL PI IND STRL 8.5 (GLOVE) ×1 IMPLANT
GLOVE BIOGEL PI INDICATOR 8 (GLOVE) ×6
GLOVE BIOGEL PI INDICATOR 8.5 (GLOVE) ×2
GLOVE SURG ORTHO 8.0 STRL STRW (GLOVE) ×3 IMPLANT
GOWN STRL REUS W/ TWL LRG LVL3 (GOWN DISPOSABLE) ×1 IMPLANT
GOWN STRL REUS W/TWL LRG LVL3 (GOWN DISPOSABLE) ×2
LOOP VESSEL MAXI BLUE (MISCELLANEOUS) IMPLANT
NEEDLE HYPO 25X1 1.5 SAFETY (NEEDLE) IMPLANT
NS IRRIG 1000ML POUR BTL (IV SOLUTION) ×3 IMPLANT
PACK BASIN DAY SURGERY FS (CUSTOM PROCEDURE TRAY) ×3 IMPLANT
PAD CAST 3X4 CTTN HI CHSV (CAST SUPPLIES) IMPLANT
PADDING CAST ABS 4INX4YD NS (CAST SUPPLIES) ×2
PADDING CAST ABS COTTON 4X4 ST (CAST SUPPLIES) ×1 IMPLANT
PADDING CAST COTTON 3X4 STRL (CAST SUPPLIES)
SPLINT PLASTER CAST XFAST 3X15 (CAST SUPPLIES) IMPLANT
SPLINT PLASTER XTRA FASTSET 3X (CAST SUPPLIES)
STOCKINETTE 4X48 STRL (DRAPES) ×3 IMPLANT
SUT ETHILON 4 0 PS 2 18 (SUTURE) IMPLANT
SWAB COLLECTION DEVICE MRSA (MISCELLANEOUS) IMPLANT
SWAB CULTURE ESWAB REG 1ML (MISCELLANEOUS) IMPLANT
SYR BULB 3OZ (MISCELLANEOUS) IMPLANT
SYR CONTROL 10ML LL (SYRINGE) IMPLANT
TOWEL GREEN STERILE FF (TOWEL DISPOSABLE) ×6 IMPLANT
TUBE FEEDING ENTERAL 5FR 16IN (TUBING) IMPLANT
UNDERPAD 30X30 (UNDERPADS AND DIAPERS) ×3 IMPLANT
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
## 2018-05-22 NOTE — Transfer of Care (Signed)
Immediate Anesthesia Transfer of Care Note  Patient: Linda Cervantes  Procedure(s) Performed: IRRIGATION AND DEBRIDEMENT RIGHT ARM WITH REMOVAL FOREIGN BODY (Right Arm Lower)  Patient Location: PACU  Anesthesia Type:General  Level of Consciousness: awake and sedated  Airway & Oxygen Therapy: Patient Spontanous Breathing and Patient connected to face mask oxygen  Post-op Assessment: Report given to RN and Post -op Vital signs reviewed and stable  Post vital signs: Reviewed and stable  Last Vitals:  Vitals Value Taken Time  BP    Temp    Pulse 65 05/22/2018  5:06 PM  Resp 12 05/22/2018  5:06 PM  SpO2 100 % 05/22/2018  5:06 PM  Vitals shown include unvalidated device data.  Last Pain:  Vitals:   05/22/18 1521  TempSrc: Oral         Complications: No apparent anesthesia complications

## 2018-05-22 NOTE — ED Notes (Signed)
Pts friend is here and able to take pt to [facility]. Onset was called and this RN was told they would call back when the pt is able to head over for surgery.

## 2018-05-22 NOTE — Anesthesia Procedure Notes (Signed)
Procedure Name: LMA Insertion Performed by: Briar Sword W, CRNA Pre-anesthesia Checklist: Patient identified, Emergency Drugs available, Suction available and Patient being monitored Patient Re-evaluated:Patient Re-evaluated prior to induction Oxygen Delivery Method: Circle system utilized Preoxygenation: Pre-oxygenation with 100% oxygen Induction Type: IV induction Ventilation: Mask ventilation without difficulty LMA: LMA inserted LMA Size: 4.0 Number of attempts: 1 Placement Confirmation: positive ETCO2 Tube secured with: Tape Dental Injury: Teeth and Oropharynx as per pre-operative assessment        

## 2018-05-22 NOTE — Progress Notes (Signed)
Pt admits to using heroin and cocaine last night. Told Dr Roanna Banning and Tim Blocker CRNA. Pt also states "wet" her tongue with black coffee but did not swallow in our waiting room. Dr ellender aware. Pt procedure delayed till 1630 at this time

## 2018-05-22 NOTE — ED Notes (Signed)
I&D tray, chucks, and suture cart at bedside.

## 2018-05-22 NOTE — Consult Note (Signed)
Reason for Consult:FB FA Referring Physician: P Cardama  Linda Cervantes is an 53 y.o. female.  HPI: Linda Cervantes was injecting heroin when the needle snapped off while in her FA. This happened yesterday. She came to the ED and the EDP attempted to locate and remove the needle but was unable and hand surgery was consulted. She is RHD.  Past Medical History:  Diagnosis Date  . Anxiety   . Asthma   . Back pain   . Depression   . Endometriosis   . GERD (gastroesophageal reflux disease)   . Heart murmur   . 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 Substance abuse ([place]) Clean as of 2009   Crack cocaine    Past Surgical History:  Procedure Laterality Date  . CESAREAN SECTION    . FOOT SURGERY    . TUBAL LIGATION  2000    Family History  Problem Relation Age of Onset  . Hypertension Father   . Diabetes Maternal Grandmother     Social History:  reports that she has been smoking cigarettes. She has been smoking about 1.00 pack per day. She has quit using smokeless tobacco. She reports current drug use. Drugs: Marijuana, Cocaine, Heroin, and IV. She reports that she does not drink alcohol.  Allergies:  Allergies  Allergen Reactions  . Amitriptyline Hcl Other (See Comments)    Face Swelling    Medications: I have reviewed the patient's current medications.  No results found for this or any previous visit (from the past 48 hour(s)).  Dg Forearm Right  Result Date: 05/22/2018 CLINICAL DATA:  Needle broken often forearm EXAM: RIGHT FOREARM - 2 VIEW COMPARISON:  None. FINDINGS: There is a linear foreign body within the lateral soft tissues of the distal right forearm the optic measures approximately 7 mm on both AP and lateral views, indicating that it is actually longer. The needle is 5 mm from the lateral skin surface. There is moderate soft tissue swelling. IMPRESSION: Radiopaque foreign body within the lateral soft tissues of the distal right forearm. Electronically Signed   By:  [name] M.D.   On: 05/22/2018 02:27    Review of Systems  Constitutional: Negative for weight loss.  HENT: Negative for ear discharge, ear pain, hearing loss and tinnitus.   Eyes: Negative for blurred vision, double vision, photophobia and pain.  Respiratory: Negative for cough, sputum production and shortness of breath.   Cardiovascular: Negative for chest pain.  Gastrointestinal: Negative for abdominal pain, nausea and vomiting.  Genitourinary: Negative for dysuria, flank pain, frequency and urgency.  Musculoskeletal: Positive for joint pain (Right wrist). Negative for back pain, falls, myalgias and neck pain.  Neurological: Negative for dizziness, tingling, sensory change, focal weakness, loss of consciousness and headaches.  Endo/Heme/Allergies: Does not bruise/bleed easily.  Psychiatric/Behavioral: Negative for depression, memory loss and substance abuse. The patient is not nervous/anxious.    Blood pressure 94/69, pulse 67, temperature 98 F (36.7 C), temperature source Oral, resp. rate 16, SpO2 97 %. Physical Exam  Constitutional: She appears well-developed and well-nourished. No distress.  HENT:  Head: Normocephalic and atraumatic.  Eyes: Conjunctivae are normal. Right eye exhibits no discharge. Left eye exhibits no discharge. No scleral icterus.  Neck: Normal range of motion.  Cardiovascular: Normal rate and regular rhythm.  Respiratory: Effort normal. No respiratory distress.  Musculoskeletal:     Comments: Right shoulder, elbow, wrist, digits- Sutured lac radial distal FA, mild TTP, no instability, no blocks to motion  Sens  Ax/R/M/U  intact  Mot   Ax/ R/ PIN/ M/ AIN/ U intact  Rad 2+  Neurological: She is alert.  Skin: Skin is warm and dry. She is not diaphoretic.  Psychiatric: She has a normal mood and affect. Her behavior is normal.    Assessment/Plan: Right FA FB -- Plan for FB removal at [facility] Day today by Dr. Fredna Dow. They will call when they are ready. Please  keep NPO.    Lisette Abu, PA-C Orthopedic Surgery [phone] 05/22/2018, 8:46 AM

## 2018-05-22 NOTE — ED Triage Notes (Addendum)
C/o needle breaking off in R forearm while using drugs this morning.  Redness noted to R forearm.

## 2018-05-22 NOTE — Anesthesia Preprocedure Evaluation (Addendum)
Anesthesia Evaluation  Patient identified by MRN, date of birth, ID bandGeneral Assessment Comment:Patient somnolent in pre-op. Responds to verbal stimulation.    Reviewed: Allergy & Precautions, NPO status , Patient's Chart, lab work & pertinent test results  Airway Mallampati: II  TM Distance: >3 FB Neck ROM: Full    Dental  (+) Edentulous Upper   Pulmonary asthma , Current Smoker,    Pulmonary exam normal breath sounds clear to auscultation       Cardiovascular negative cardio ROS Normal cardiovascular exam Rhythm:Regular Rate:Normal     Neuro/Psych PSYCHIATRIC DISORDERS Anxiety Depression negative neurological ROS     GI/Hepatic GERD  Controlled,(+)     substance abuse  cocaine use and IV drug use, Hepatitis -, C  Endo/Other  negative endocrine ROS  Renal/GU negative Renal ROS     Musculoskeletal negative musculoskeletal ROS (+) narcotic dependent  Abdominal   Peds  Hematology negative hematology ROS (+)   Anesthesia Other Findings RETAINED NEEDLE RIGHT ARM  Reproductive/Obstetrics                            Anesthesia Physical Anesthesia Plan  ASA: III  Anesthesia Plan: General   Post-op Pain Management:    Induction: Intravenous  PONV Risk Score and Plan: 2 and Ondansetron, Dexamethasone and Treatment may vary due to age or medical condition  Airway Management Planned: LMA  Additional Equipment:   Intra-op Plan:   Post-operative Plan: Extubation in OR  Informed Consent: I have reviewed the patients History and Physical, chart, labs and discussed the procedure including the risks, benefits and alternatives for the proposed anesthesia with the patient or authorized representative who has indicated his/her understanding and acceptance.     Dental advisory given  Plan Discussed with: CRNA and Surgeon  Anesthesia Plan Comments:       Anesthesia Quick Evaluation

## 2018-05-22 NOTE — Op Note (Addendum)
Intra-operative fluoroscopic images in the AP, lateral, and oblique views were taken and evaluated by myself.  Removal of radioopaque foreign body was confirmed.

## 2018-05-22 NOTE — Op Note (Signed)
I assisted Surgeon(s) and Role:    * [name], MD - Primary    Daryll Brod, MD - Assisting on the Procedure(s): [place] on 05/22/2018.  I provided assistance on this case as follows: approach, search, removal of foreign body.  Electronically signed by: Daryll Brod, MD Date: 05/22/2018 Time: 5:04 PM

## 2018-05-22 NOTE — Discharge Instructions (Addendum)
No Tylenol until 12:30am if needed No Ibuprofen until 1:00am if needed  [facility] Center Instructions Hand Surgery  Wound Care: Keep your hand elevated above the level of your heart.  Do not allow it to dangle by your side.  Keep the dressing dry and do not remove it unless your doctor advises you to do so.  He will usually change it at the time of your post-op visit.  Moving your fingers is advised to stimulate circulation but will depend on the site of your surgery.  If you have a splint applied, your doctor will advise you regarding movement.  Activity: Do not drive or operate machinery today.  Rest today and then you may return to your normal activity and work as indicated by your physician.  Diet:  Drink liquids today or eat a light diet.  You may resume a regular diet tomorrow.    General expectations: Pain for two to three days. Fingers may become slightly swollen.  Call your doctor if any of the following occur: Severe pain not relieved by pain medication. Elevated temperature. Dressing soaked with blood. Inability to move fingers. White or bluish color to fingers.   Post Anesthesia Home Care Instructions  Activity: Get plenty of rest for the remainder of the day. A responsible individual must stay with you for 24 hours following the procedure.  For the next 24 hours, DO NOT: -Drive a car -Paediatric nurse -Drink alcoholic beverages -Take any medication unless instructed by your physician -Make any legal decisions or sign important papers.  Meals: Start with liquid foods such as gelatin or soup. Progress to regular foods as tolerated. Avoid greasy, spicy, heavy foods. If nausea and/or vomiting occur, drink only clear liquids until the nausea and/or vomiting subsides. Call your physician if vomiting continues.  Special Instructions/Symptoms: Your throat may feel dry or sore from the anesthesia or the breathing tube placed in your throat during surgery. If this causes  discomfort, gargle with warm salt water. The discomfort should disappear within 24 hours.  If you had a scopolamine patch placed behind your ear for the management of post- operative nausea and/or vomiting:  1. The medication in the patch is effective for 72 hours, after which it should be removed.  Wrap patch in a tissue and discard in the trash. Wash hands thoroughly with soap and water. 2. You may remove the patch earlier than 72 hours if you experience unpleasant side effects which may include dry mouth, dizziness or visual disturbances. 3. Avoid touching the patch. Wash your hands with soap and water after contact with the patch.

## 2018-05-22 NOTE — ED Notes (Signed)
Pt reports having a heroin needle stuck in her arm. Pt reports same broke off in her arm at about 0000 this date.

## 2018-05-22 NOTE — Progress Notes (Signed)
No labs needed pre-op per Dr. Roanna Banning.

## 2018-05-22 NOTE — H&P (Signed)
Linda Cervantes is an 53 y.o. female.   Chief Complaint: right forearm foreign body HPI: 53 yo female states she was injecting right forearm yesterday and the needle broke off.  Seen in ED where attempts at removal were unsuccessful.  She reports pain in the forearm that is alleviated and aggravated by nothing.  There is associated wound.    Case discussed with Linda Lank, MD and his note from 05/22/2018 reviewed. Xrays viewed and interpreted by me: ap and lateral views of forearm show radioopaque foreign body in distal radial forearm Labs reviewed: none  Allergies:  Allergies  Allergen Reactions  . Amitriptyline Hcl Other (See Comments)    Face Swelling    Past Medical History:  Diagnosis Date  . Anxiety   . Asthma   . Back pain   . Depression   . Endometriosis   . GERD (gastroesophageal reflux disease)   . Heart murmur   . 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 Substance abuse ([place]) Clean as of 2009   Crack cocaine    Past Surgical History:  Procedure Laterality Date  . CESAREAN SECTION    . FOOT SURGERY    . TUBAL LIGATION  2000    Family History: Family History  Problem Relation Age of Onset  . Hypertension Father   . Diabetes Maternal Grandmother     Social History:   reports that she has been smoking cigarettes. She has been smoking about 1.00 pack per day. She has quit using smokeless tobacco. She reports current drug use. Drugs: Cocaine, Heroin, and IV. She reports that she does not drink alcohol.  Medications: Medications Prior to Admission  Medication Sig Dispense Refill  . albuterol (PROVENTIL HFA;VENTOLIN HFA) 108 (90 Base) MCG/ACT inhaler Inhale 2 puffs into the lungs every 6 (six) hours as needed for wheezing (wheezing). 1 Inhaler 1  . fluticasone (FLONASE) 50 MCG/ACT nasal spray Place 2 sprays into both nostrils daily. 16 g 6  . gabapentin (NEURONTIN) 300 MG capsule Take 1 capsule (300 mg total) by mouth 3 (three) times daily. 90 capsule  3  . hydrocortisone cream 0.5 % Apply 1 application topically 2 (two) times daily. 30 g 2  . ibuprofen (ADVIL,MOTRIN) 800 MG tablet Take 1 tablet (800 mg total) by mouth every 8 (eight) hours as needed. 30 tablet 3  . meclizine (ANTIVERT) 25 MG tablet Take 1 tablet (25 mg total) by mouth 3 (three) times daily as needed for dizziness. 30 tablet 3  . meloxicam (MOBIC) 7.5 MG tablet Take 2 tablets (15 mg total) by mouth daily. 30 tablet 3  . OLANZapine (ZYPREXA) 10 MG tablet Take 1 tablet (10 mg total) by mouth at bedtime. 30 tablet 3  . albuterol (VENTOLIN HFA) 108 (90 Base) MCG/ACT inhaler INHALE 2 PUFFS BY MOUTH EVERY 6 HOURS AS NEEDED FOR WHEEZING (WHEEZING). (Patient not taking: Reported on 04/03/2018) 18 g 1  . benzocaine (ORAJEL) 10 % mucosal gel Use as directed 1 application in the mouth or throat as needed for mouth pain. (Patient not taking: Reported on 04/03/2018) 5.3 g 0  . cetirizine (ZYRTEC) 10 MG tablet Take 1 tablet (10 mg total) by mouth daily. (Patient not taking: Reported on 05/22/2018) 30 tablet 11    No results found for this or any previous visit (from the past 48 hour(s)).  Dg Forearm Right  Result Date: 05/22/2018 CLINICAL DATA:  Needle broken often forearm EXAM: RIGHT FOREARM - 2 VIEW COMPARISON:  None. FINDINGS: There is a  linear foreign body within the lateral soft tissues of the distal right forearm the optic measures approximately 7 mm on both AP and lateral views, indicating that it is actually longer. The needle is 5 mm from the lateral skin surface. There is moderate soft tissue swelling. IMPRESSION: Radiopaque foreign body within the lateral soft tissues of the distal right forearm. Electronically Signed   By: [name] M.D.   On: 05/22/2018 02:27     A comprehensive review of systems was negative. Review of Systems: No fevers, chills, night sweats, chest pain, shortness of breath, nausea, vomiting, diarrhea, constipation, easy bleeding or bruising, headaches,  dizziness, vision changes, fainting.   Blood pressure (!) 90/55, pulse 65, temperature 97.9 F (36.6 C), temperature source Oral, resp. rate 18, height 5\' 3"  (1.6 m), weight 52.4 kg, SpO2 98 %.  General appearance: alert, cooperative and appears stated age Head: Normocephalic, without obvious abnormality, atraumatic Neck: supple, symmetrical, trachea midline Resp: clear to auscultation bilaterally Cardio: regular rate and rhythm Extremities: Intact sensation and capillary refill all digits.  +epl/fpl/io.  Right forearm with sutured wound at distal radial aspect.  No erythema. Pulses: 2+ and symmetric Skin: Skin color, texture, turgor normal. No rashes or lesions Neurologic: Grossly normal Incision/Wound: as above  Assessment/Plan Right forearm foreign body.  Recommend removal in OR.  Risks, benefits and alternatives of surgery were discussed including risks of blood loss, infection, damage to nerves/vessels/tendons/ligament/bone, failure of surgery, need for additional surgery, complication with wound healing, stiffness, retained foreign body.  She voiced understanding of these risks and elected to proceed.    Linda Cervantes 05/22/2018, 3:39 PM

## 2018-05-22 NOTE — Op Note (Signed)
NAME: Linda Cervantes MEDICAL RECORD NO: [id number] DATE OF BIRTH: 12/18/1965 FACILITY: [facility] LOCATION: [facility] PHYSICIAN: Tennis Must, MD   OPERATIVE REPORT   DATE OF PROCEDURE: 05/22/18    PREOPERATIVE DIAGNOSIS:   Foreign body right distal forearm   POSTOPERATIVE DIAGNOSIS:   Foreign body right distal forearm   PROCEDURE:   Removal deep foreign body right distal forearm   SURGEON:  [name], M.D.   ASSISTANT: Daryll Brod, MD   ANESTHESIA:  General   INTRAVENOUS FLUIDS:  Per anesthesia flow sheet.   ESTIMATED BLOOD LOSS:  Minimal.   COMPLICATIONS:  None.   SPECIMENS:  none   TOURNIQUET TIME:    Total Tourniquet Time Documented: Forearm (Right) - 16 minutes Total: Forearm (Right) - 16 minutes    DISPOSITION:  Stable to PACU.   INDICATIONS: 53 year old female states she was injecting heroin last night when the needle broke off in her right forearm.  She presented to the emergency department.  Attempts in the emergency department to extract the needle were unsuccessful.  Recommended removal in the operating room. Risks, benefits and alternatives of surgery were discussed including the risks of blood loss, infection, damage to nerves, vessels, tendons, ligaments, bone for surgery, need for additional surgery, complications with wound healing, continued pain, nonunion, malunion, stiffness.  She voiced understanding of these risks and elected to proceed.  OPERATIVE COURSE:  After being identified preoperatively by myself,  the patient and I agreed on the procedure and site of the procedure.  The surgical site was marked.  Surgical consent had been signed. She was given IV Ancef as preoperative antibiotic prophylaxis. She was transferred to the operating room and placed on the operating table in supine position with the Right upper extremity on an arm board.  General anesthesia was induced by the anesthesiologist.  Right upper extremity was prepped and  draped in normal sterile orthopedic fashion.  A surgical pause was performed between the surgeons, anesthesia, and operating room staff and all were in agreement as to the patient, procedure, and site of procedure.  Tourniquet at the proximal aspect of the extremity was inflated to 250 mmHg after exsanguination of the arm with an Esmarch bandage.    The incision from the ER was reopened.  The area was inspected.  There was scarring around the vein.  C-arm was used in AP and lateral projections throughout the case to aid in localization of the radiopaque foreign body.  Once it was identified it was removed.  Radiographs taken after removal showed no remaining radiopaque foreign body.  The wound was copiously irrigated with sterile saline.  Tourniquet was deflated at 16 minutes.  Fingertips were pink with brisk capillary refill after deflation of tourniquet.    The wound was hemostatic.  It was packed with quarter inch iodoform gauze and dressed with sterile 4 x 4's and ABD used as a splint wrapped with Kerlix and Ace bandage.  The operative  drapes were broken down.  The patient was awoken from anesthesia safely.  She was transferred back to the stretcher and taken to PACU in stable condition.  I will see her back in the office in 3-4 days for postoperative followup.  I will give her a prescription for Norco 5/325 1-2 tabs PO q6 hours prn pain, dispense # 10.   [name], MD Electronically signed, 05/22/18

## 2018-05-23 ENCOUNTER — Encounter (HOSPITAL_BASED_OUTPATIENT_CLINIC_OR_DEPARTMENT_OTHER): Payer: Self-pay | Admitting: Orthopedic Surgery

## 2018-05-23 NOTE — Anesthesia Postprocedure Evaluation (Signed)
Anesthesia Post Note  Patient: Linda Cervantes  Procedure(s) Performed: IRRIGATION AND DEBRIDEMENT RIGHT ARM WITH REMOVAL FOREIGN BODY (Right Arm Lower)     Patient location during evaluation: PACU Anesthesia Type: General Level of consciousness: awake and alert Pain management: pain level controlled Vital Signs Assessment: post-procedure vital signs reviewed and stable Respiratory status: spontaneous breathing, nonlabored ventilation, respiratory function stable and patient connected to nasal cannula oxygen Cardiovascular status: blood pressure returned to baseline and stable Postop Assessment: no apparent nausea or vomiting Anesthetic complications: no    Last Vitals:  Vitals:   05/22/18 1715 05/22/18 1730  BP: 107/65 101/63  Pulse: 67 63  Resp: 18 15  Temp:  36.5 C  SpO2: 100% 98%    Last Pain:  Vitals:   05/22/18 1730  TempSrc:   PainSc: 0-No pain                 [name] S

## 2018-05-27 ENCOUNTER — Ambulatory Visit (HOSPITAL_COMMUNITY)
Admission: EM | Admit: 2018-05-27 | Discharge: 2018-05-27 | Disposition: A | Discharge status: Home / Self Care | Payer: Self-pay

## 2018-05-27 NOTE — ED Notes (Signed)
Patient says she didn't go to her surgeon's appt today as scheduled.  Encouraged patient to follow up with specialist in morning-to call them first thing in the morning.  Spoke to dr hagler about patient.

## 2018-07-15 ENCOUNTER — Other Ambulatory Visit: Payer: Self-pay

## 2018-07-15 ENCOUNTER — Ambulatory Visit (INDEPENDENT_AMBULATORY_CARE_PROVIDER_SITE_OTHER): Payer: Self-pay | Admitting: Family Medicine

## 2018-07-15 DIAGNOSIS — F1910 Other psychoactive substance abuse, uncomplicated: Secondary | ICD-10-CM

## 2018-07-15 DIAGNOSIS — H6992 Unspecified Eustachian tube disorder, left ear: Secondary | ICD-10-CM

## 2018-07-15 DIAGNOSIS — R42 Dizziness and giddiness: Secondary | ICD-10-CM

## 2018-07-15 DIAGNOSIS — H6982 Other specified disorders of Eustachian tube, left ear: Secondary | ICD-10-CM

## 2018-07-15 DIAGNOSIS — H9393 Unspecified disorder of ear, bilateral: Secondary | ICD-10-CM

## 2018-07-15 DIAGNOSIS — F419 Anxiety disorder, unspecified: Secondary | ICD-10-CM

## 2018-07-15 NOTE — Progress Notes (Signed)
Virtual Visit via Telephone Note  I connected with Linda Cervantes on 07/15/18 at  2:40 PM EDT by telephone and verified that I am speaking with the correct person using two identifiers.   I discussed the limitations, risks, security and privacy concerns of performing an evaluation and management service by telephone and the availability of in person appointments. I also discussed with the patient that there may be a patient responsible charge related to this service. The patient expressed understanding and agreed to proceed.   History of Present Illness: Past Medical History:  Diagnosis Date  . Anxiety   . Asthma   . Back pain   . Depression   . Endometriosis   . GERD (gastroesophageal reflux disease)   . Heart murmur   . 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 Substance abuse ([place]) Clean as of 2009   Crack cocaine    Current Outpatient Medications on File Prior to Visit  Medication Sig Dispense Refill  . albuterol (PROVENTIL HFA;VENTOLIN HFA) 108 (90 Base) MCG/ACT inhaler Inhale 2 puffs into the lungs every 6 (six) hours as needed for wheezing (wheezing). 1 Inhaler 1  . cetirizine (ZYRTEC) 10 MG tablet Take 1 tablet (10 mg total) by mouth daily. 30 tablet 11  . fluticasone (FLONASE) 50 MCG/ACT nasal spray Place 2 sprays into both nostrils daily. 16 g 6  . gabapentin (NEURONTIN) 300 MG capsule Take 1 capsule (300 mg total) by mouth 3 (three) times daily. 90 capsule 3  . hydrocortisone cream 0.5 % Apply 1 application topically 2 (two) times daily. 30 g 2  . meclizine (ANTIVERT) 25 MG tablet Take 1 tablet (25 mg total) by mouth 3 (three) times daily as needed for dizziness. 30 tablet 3  . OLANZapine (ZYPREXA) 10 MG tablet Take 1 tablet (10 mg total) by mouth at bedtime. 30 tablet 3  . ibuprofen (ADVIL,MOTRIN) 800 MG tablet Take 1 tablet (800 mg total) by mouth every 8 (eight) hours as needed. 30 tablet 3  . meloxicam (MOBIC) 7.5 MG tablet Take 2 tablets (15 mg total) by mouth  daily. 30 tablet 3   No current facility-administered medications on file prior to visit.     Current Status: Since her last office visit, she has has several ED visits 05/22/2018 and 05/27/2018 for Foreign Body and a recent Fall. Today, she continues to have dizziness when changing from one position to another. She states that she has been using Meclizine as prescribed with no relief. She was referred to ENT at her last appointment, but she was unable to attend because of financial problems.  She states that she continues to have low energy. She recently relapsed and had a needle dislodged in her arm, in which [facility]. She has not been able to go to appointment at [facility]. She was a recent patient of [facility]. She denies suicidal ideations, homicidal ideations, or auditory hallucinations. She is currently in treatment in the [facility] ([facility] [address]), and receives treatments every day.   She denies fevers, chills, fatigue, recent infections, weight loss, and night sweats. She has not had any headaches, and visual changes. No chest pain, heart palpitations, cough and shortness of breath reported. No reports of GI problems such as nausea, vomiting, diarrhea, and constipation. She has no reports of blood in stools, dysuria and hematuria. She denies pain today.    Observations/Objective:  Telephone Virtual Visit   Assessment and  Plan:  1. Ear problems, bilateral She was referred to ENT. She will follow up for possible future surgery.   2. Eustachian tube dysfunction, left  3. Dizziness Mild. Continue Meclizine as prescribed.   4. Anxiety She will continue Zyprexa as prescribed.   5. 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 Stable today.   No orders of the defined types were placed in this encounter.   No orders of the defined types were placed in this encounter.   Referral Orders  No referral(s)  requested today    Kathe Becton,  MSN, FNP-C [facility] [address] [place], [place] [address] [phone]    Follow Up Instructions:  She will follow up in 3 months.    I discussed the assessment and treatment plan with the patient. The patient was provided an opportunity to ask questions and all were answered. The patient agreed with the plan and demonstrated an understanding of the instructions.   The patient was advised to call back or seek an in-person evaluation if the symptoms worsen or if the condition fails to improve as anticipated.  I provided 15-20 minutes of non-face-to-face time during this encounter.   Azzie Glatter, FNP

## 2018-07-17 DIAGNOSIS — H6982 Other specified disorders of Eustachian tube, left ear: Secondary | ICD-10-CM | POA: Insufficient documentation

## 2018-07-17 DIAGNOSIS — H9393 Unspecified disorder of ear, bilateral: Secondary | ICD-10-CM | POA: Insufficient documentation

## 2018-07-17 DIAGNOSIS — H6992 Unspecified Eustachian tube disorder, left ear: Secondary | ICD-10-CM | POA: Insufficient documentation

## 2018-07-17 DIAGNOSIS — F419 Anxiety disorder, unspecified: Secondary | ICD-10-CM | POA: Insufficient documentation

## 2018-07-17 DIAGNOSIS — F1910 Other psychoactive substance abuse, uncomplicated: Secondary | ICD-10-CM | POA: Insufficient documentation

## 2018-07-17 DIAGNOSIS — R42 Dizziness and giddiness: Secondary | ICD-10-CM | POA: Insufficient documentation

## 2018-10-14 ENCOUNTER — Ambulatory Visit: Payer: Self-pay | Admitting: Family Medicine

## 2018-10-31 ENCOUNTER — Other Ambulatory Visit: Payer: Self-pay

## 2018-10-31 ENCOUNTER — Encounter (HOSPITAL_COMMUNITY): Payer: Self-pay

## 2018-10-31 ENCOUNTER — Emergency Department (HOSPITAL_COMMUNITY)
Admission: EM | Admit: 2018-10-31 | Discharge: 2018-10-31 | Discharge status: Against Medical Advice | Payer: Self-pay | Attending: Emergency Medicine | Admitting: Emergency Medicine

## 2018-10-31 DIAGNOSIS — Z5321 Procedure and treatment not carried out due to patient leaving prior to being seen by health care provider: Secondary | ICD-10-CM | POA: Insufficient documentation

## 2018-10-31 NOTE — ED Notes (Signed)
Called patient's name, no response.

## 2018-10-31 NOTE — ED Notes (Signed)
Pt called for room with no response.  °

## 2018-10-31 NOTE — ED Notes (Signed)
Pt called back for room x1. No response.

## 2018-10-31 NOTE — ED Triage Notes (Signed)
Pt reports right lower wisdom tooth pain for the past few days.

## 2018-10-31 NOTE — ED Provider Notes (Signed)
Pt left from the waiting room after traige. I never saw or evaluated the pt.    Franchot Heidelberg, PA-C 10/31/18 1652    Lennice Sites, DO 10/31/18 1701

## 2018-11-12 ENCOUNTER — Telehealth: Payer: Self-pay

## 2018-11-13 ENCOUNTER — Other Ambulatory Visit: Payer: Self-pay

## 2018-11-13 MED ORDER — ALBUTEROL SULFATE HFA 108 (90 BASE) MCG/ACT IN AERS: 2.0000 | INHALATION_SPRAY | Freq: Four times a day (QID) | RESPIRATORY_TRACT | 3 refills | Status: DC | PRN

## 2018-11-13 MED ORDER — FLUTICASONE PROPIONATE 50 MCG/ACT NA SUSP: 2.0000 | Freq: Every day | NASAL | 6 refills | Status: DC

## 2018-11-13 MED FILL — !VENTOLIN HFA INHALER: 108 (90 BAS | 25 days supply | Qty: 18 | Fill #0

## 2018-11-13 MED FILL — FLUTICASONE PROP 50 MCG SPR: 50 | 30 days supply | Qty: 16 | Fill #0

## 2018-11-13 NOTE — Telephone Encounter (Signed)
Left a detail vm that medication has been sent to pharmacy

## 2018-11-18 ENCOUNTER — Ambulatory Visit: Payer: Self-pay | Admitting: Family Medicine

## 2018-11-25 ENCOUNTER — Other Ambulatory Visit: Payer: Self-pay

## 2018-11-25 ENCOUNTER — Ambulatory Visit (INDEPENDENT_AMBULATORY_CARE_PROVIDER_SITE_OTHER): Payer: Worker's Compensation | Admitting: Family Medicine

## 2018-11-25 ENCOUNTER — Encounter: Payer: Self-pay | Admitting: Family Medicine

## 2018-11-25 VITALS — BP 130/81 | HR 79 | Temp 97.9°F | Ht 63.0 in | Wt 125.8 lb

## 2018-11-25 DIAGNOSIS — K5900 Constipation, unspecified: Secondary | ICD-10-CM

## 2018-11-25 DIAGNOSIS — F419 Anxiety disorder, unspecified: Secondary | ICD-10-CM

## 2018-11-25 DIAGNOSIS — Z09 Encounter for follow-up examination after completed treatment for conditions other than malignant neoplasm: Secondary | ICD-10-CM

## 2018-11-25 DIAGNOSIS — H9393 Unspecified disorder of ear, bilateral: Secondary | ICD-10-CM

## 2018-11-25 DIAGNOSIS — F1910 Other psychoactive substance abuse, uncomplicated: Secondary | ICD-10-CM

## 2018-11-25 DIAGNOSIS — R42 Dizziness and giddiness: Secondary | ICD-10-CM

## 2018-11-25 DIAGNOSIS — H6123 Impacted cerumen, bilateral: Secondary | ICD-10-CM

## 2018-11-25 MED ORDER — SENNA 8.6 MG PO TABS: 1.0000 | ORAL_TABLET | Freq: Every day | ORAL | 3 refills | Status: DC

## 2018-11-25 NOTE — Progress Notes (Signed)
Patient [facility]   Established Patient Office Visit  Subjective:  Patient ID: Linda Cervantes, female    DOB: [birth year]  Age: 53 y.o. MRN: [id number]  CC:  Chief Complaint  Patient presents with  . Follow-up    follow up , left ear feels full     HPI Linda Cervantes is a 53 year old female who presents for Follow Up today.  Past Medical History:  Diagnosis Date  . Anxiety   . Asthma   . Back pain   . Depression   . Endometriosis   . GERD (gastroesophageal reflux disease)   . Heart murmur   . 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 Substance abuse ([place]) Clean as of 2009   Crack cocaine   Current Status: Since her last office visit, she has visited the ED on 10/31/2018, but left before she was seen. She is doing well with no complaints. Her anxiety is mild today. She denies suicidal ideations, homicidal ideations, or auditory hallucinations. She has chronic bilateral ear problems. She has c/o constipation for the last few months. She continues to visit [facility] everyday for Methadone 100 mg elixir dosage. She denies fevers, chills, fatigue, recent infections, weight loss, and night sweats. She has not had any headaches, visual changes, and falls. No chest pain, heart palpitations, cough and shortness of breath reported. No reports of GI problems such as nausea, vomiting, diarrhea, and constipation. She has no reports of blood in stools, dysuria and hematuria.  She denies pain today.   Past Surgical History:  Procedure Laterality Date  . CESAREAN SECTION    . FOOT SURGERY    . INCISION AND DRAINAGE OF WOUND Right 05/22/2018   Procedure: IRRIGATION AND DEBRIDEMENT RIGHT ARM WITH REMOVAL FOREIGN BODY;  Surgeon: [name], MD;  Location: [facility];  Service: Orthopedics;  Laterality: Right;  . TUBAL LIGATION  2000    Family History  Problem Relation Age of Onset  . Hypertension Father   . Diabetes  Maternal Grandmother     Social History   Socioeconomic History  . Marital status: Legally Separated    Spouse name: Not on file  . Number of children: Not on file  . Years of education: Not on file  . Highest education level: Not on file  Occupational History  . Not on file  Social Needs  . Financial resource strain: Not on file  . Food insecurity    Worry: Not on file    Inability: Not on file  . Transportation needs    Medical: Not on file    Non-medical: Not on file  Tobacco Use  . Smoking status: Current Every Day Smoker    Packs/day: 1.00    Types: Cigarettes  . Smokeless tobacco: Former Network engineer and Sexual Activity  . Alcohol use: No  . Drug use: Yes    Types: Cocaine, Heroin, IV  . Sexual activity: Yes    Birth control/protection: Surgical    Comment: TUBAL LIGATION  Lifestyle  . Physical activity    Days per week: Not on file    Minutes per session: Not on file  . Stress: Not on file  Relationships  . Social Herbalist on phone: Not on file    Gets together: Not on file    Attends religious service: Not on file    Active member of club or organization: Not on file  Attends meetings of clubs or organizations: Not on file    Relationship status: Not on file  . Intimate partner violence    Fear of current or ex partner: Not on file    Emotionally abused: Not on file    Physically abused: Not on file    Forced sexual activity: Not on file  Other Topics Concern  . Not on file  Social History Narrative  . Not on file    Outpatient Medications Prior to Visit  Medication Sig Dispense Refill  . albuterol (VENTOLIN HFA) 108 (90 Base) MCG/ACT inhaler Inhale 2 puffs into the lungs every 6 (six) hours as needed for wheezing (wheezing). 6.7 g 3  . cetirizine (ZYRTEC) 10 MG tablet Take 1 tablet (10 mg total) by mouth daily. 30 tablet 11  . fluticasone (FLONASE) 50 MCG/ACT nasal spray Place 2 sprays into both nostrils daily. 16 g 6  .  gabapentin (NEURONTIN) 300 MG capsule Take 1 capsule (300 mg total) by mouth 3 (three) times daily. 90 capsule 3  . hydrocortisone cream 0.5 % Apply 1 application topically 2 (two) times daily. 30 g 2  . ibuprofen (ADVIL,MOTRIN) 800 MG tablet Take 1 tablet (800 mg total) by mouth every 8 (eight) hours as needed. 30 tablet 3  . meclizine (ANTIVERT) 25 MG tablet Take 1 tablet (25 mg total) by mouth 3 (three) times daily as needed for dizziness. 30 tablet 3  . meloxicam (MOBIC) 7.5 MG tablet Take 2 tablets (15 mg total) by mouth daily. 30 tablet 3  . methadone (DOLOPHINE) 10 MG/5ML solution Take 100 mg by mouth daily. [facility] from [facility].    Marland Kitchen OLANZapine (ZYPREXA) 10 MG tablet Take 1 tablet (10 mg total) by mouth at bedtime. 30 tablet 3   No facility-administered medications prior to visit.     Allergies  Allergen Reactions  . Amitriptyline Hcl Other (See Comments)    Face Swelling    ROS Review of Systems  Constitutional: Negative.   HENT: Negative.   Eyes: Negative.   Respiratory: Negative.   Cardiovascular: Negative.   Gastrointestinal: Negative.   Endocrine: Negative.   Genitourinary: Negative.   Musculoskeletal: Negative.   Allergic/Immunologic: Negative.   Neurological: Negative.   Hematological: Negative.   Psychiatric/Behavioral: Negative.       Objective:    Physical Exam  Constitutional: She is oriented to person, place, and time. She appears well-developed and well-nourished.  HENT:  Head: Normocephalic and atraumatic.  Bilateral cerumen impaction.   Eyes: Conjunctivae are normal.  Neck: Normal range of motion. Neck supple.  Cardiovascular: Normal rate, regular rhythm, normal heart sounds and intact distal pulses.  Pulmonary/Chest: Effort normal and breath sounds normal.  Abdominal: Soft. Bowel sounds are normal.  Musculoskeletal: Normal range of motion.  Neurological: She is alert and oriented to person, place, and time. She has normal reflexes.   Skin: Skin is warm and dry.  Psychiatric: She has a normal mood and affect. Her behavior is normal. Judgment and thought content normal.  Nursing note and vitals reviewed.   BP 130/81 (BP Location: Right Arm, Patient Position: Sitting, Cuff Size: Normal)   Pulse 79   Temp 97.9 F (36.6 C) (Oral)   Ht 5\' 3"  (1.6 m)   Wt 125 lb 12.8 oz (57.1 kg)   BMI 22.28 kg/m  Wt Readings from Last 3 Encounters:  11/25/18 125 lb 12.8 oz (57.1 kg)  05/22/18 115 lb 8.3 oz (52.4 kg)  04/15/18 117 lb (53.1 kg)  Health Maintenance Due  Topic Date Due  . MAMMOGRAM  02/08/2018  . COLONOSCOPY  02/08/2018  . TETANUS/TDAP  10/06/2018  . INFLUENZA VACCINE  11/01/2018    There are no preventive care reminders to display for this patient.  Lab Results  Component Value Date   TSH 2.180 02/04/2018   Lab Results  Component Value Date   WBC 6.1 02/04/2018   HGB 13.3 02/04/2018   HCT 40.7 02/04/2018   MCV 82 02/04/2018   PLT 223 02/04/2018   Lab Results  Component Value Date   NA 139 02/04/2018   K 3.8 02/04/2018   CO2 21 02/04/2018   GLUCOSE 73 02/04/2018   BUN 7 02/04/2018   CREATININE 0.91 02/04/2018   BILITOT 0.3 02/04/2018   ALKPHOS 68 02/04/2018   AST 29 02/04/2018   ALT 26 02/04/2018   PROT 7.1 02/04/2018   ALBUMIN 4.2 02/04/2018   CALCIUM 9.3 02/04/2018   ANIONGAP 5 01/27/2018   Lab Results  Component Value Date   CHOL 197 02/04/2018   Lab Results  Component Value Date   HDL 49 02/04/2018   Lab Results  Component Value Date   LDLCALC 132 (H) 02/04/2018   Lab Results  Component Value Date   TRIG 81 02/04/2018   Lab Results  Component Value Date   CHOLHDL 4.0 02/04/2018   Lab Results  Component Value Date   HGBA1C 5.3 02/04/2018      Assessment & Plan:   1. Ear problems, bilateral  2. Bilateral impacted cerumen We will schedule her for ear lavage later on this week.   3. Anxiety Stable.  4. 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   5. Dizziness Continue Meclizine as prescribed.   6. Constipation, unspecified constipation type We will initiate Senna today.   7. Follow up She will follow up for Ear Lavage only in 1 week. She will follow up in 3 months for office visit.   No orders of the defined types were placed in this encounter.   No orders of the defined types were placed in this encounter.   Referral Orders  No referral(s) requested today    Kathe Becton,  MSN, FNP-BC [facility] [address] [place], [place] [address] [phone] fax    Problem List Items Addressed This Visit      Other   Anxiety   Dizziness   Drug abuse, IV ([place])   Ear problems, bilateral - Primary    Other Visit Diagnoses    Bilateral impacted cerumen       Constipation, unspecified constipation type       Follow up          No orders of the defined types were placed in this encounter.   Follow-up: No follow-ups on file.    Azzie Glatter, FNP

## 2018-12-01 ENCOUNTER — Ambulatory Visit (INDEPENDENT_AMBULATORY_CARE_PROVIDER_SITE_OTHER): Payer: Self-pay

## 2018-12-01 ENCOUNTER — Other Ambulatory Visit: Payer: Self-pay

## 2018-12-01 DIAGNOSIS — H6123 Impacted cerumen, bilateral: Secondary | ICD-10-CM

## 2018-12-01 NOTE — Progress Notes (Signed)
Cleaned out left ear, only a small about of wax noted and it washed out easily.

## 2018-12-12 MED FILL — VIT D2 1.25 MG (50,000 UNIT: 1.25 MG | 35 days supply | Qty: 5 | Fill #2

## 2018-12-12 MED FILL — GABAPENTIN 300 MG CAPSULE: 300 | 30 days supply | Qty: 90 | Fill #2

## 2018-12-12 MED FILL — OLANZapine 10 MG TABS: 10 | 30 days supply | Qty: 30 | Fill #1

## 2018-12-12 MED FILL — IBUPROFEN 800 MG TABLET: 800 | 10 days supply | Qty: 30 | Fill #1

## 2019-02-18 ENCOUNTER — Encounter (HOSPITAL_COMMUNITY): Payer: Self-pay | Admitting: Emergency Medicine

## 2019-02-18 ENCOUNTER — Other Ambulatory Visit: Payer: Self-pay

## 2019-02-18 ENCOUNTER — Ambulatory Visit (HOSPITAL_COMMUNITY)
Admission: EM | Admit: 2019-02-18 | Discharge: 2019-02-18 | Disposition: A | Discharge status: Home / Self Care | Payer: Self-pay | Attending: Emergency Medicine | Admitting: Emergency Medicine

## 2019-02-18 DIAGNOSIS — R0981 Nasal congestion: Secondary | ICD-10-CM

## 2019-02-18 DIAGNOSIS — H6982 Other specified disorders of Eustachian tube, left ear: Secondary | ICD-10-CM

## 2019-02-18 DIAGNOSIS — H9392 Unspecified disorder of left ear: Secondary | ICD-10-CM

## 2019-02-18 DIAGNOSIS — S00412A Abrasion of left ear, initial encounter: Secondary | ICD-10-CM

## 2019-02-18 MED ORDER — MUPIROCIN 2 % EX OINT: 1.0000 "application " | TOPICAL_OINTMENT | Freq: Three times a day (TID) | CUTANEOUS | 0 refills | Status: DC

## 2019-02-18 MED ORDER — FLUTICASONE PROPIONATE 50 MCG/ACT NA SUSP: 2.0000 | Freq: Every day | NASAL | 0 refills | Status: DC

## 2019-02-18 MED ORDER — MOMETASONE FUROATE 50 MCG/ACT NA SUSP
2.0000 | Freq: Every day | NASAL | 0 refills | Status: DC
Start: 2019-02-18 — End: 2020-10-20

## 2019-02-18 MED FILL — MUPIROCIN 2% OINTMENT: 2 | 7 days supply | Qty: 22 | Fill #0

## 2019-02-18 NOTE — ED Provider Notes (Signed)
HPI  SUBJECTIVE:  Linda Cervantes is a 53 y.o. female who presents with 2 years of left ear pressure, sensation of water in her ear, popping, and a sensation that her ear is "clogged". She reports muffled hearing, vertigo, left-sided nasal congestion, "sinuses" during this time.  No ear pain, otorrhea.  No allergy symptoms.  No fevers.  She has tried flushing her ear out with hot water, peroxide, auto insufflation.  Symptoms are better with chewing, yawning, pushing on the back of her ear.  No aggravating factors.  She has not been seen by ENT for this yet.  She has a past medical history including left-sided eustachian tube dysfunction "bilateral ear problems".  PMD: Azzie Glatter, FNP  Past Medical History:  Diagnosis Date  . Anxiety   . Asthma   . Back pain   . Depression   . Endometriosis   . GERD (gastroesophageal reflux disease)   . Heart murmur   . 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 Substance abuse ([place]) Clean as of 2009   Crack cocaine    Past Surgical History:  Procedure Laterality Date  . CESAREAN SECTION    . FOOT SURGERY    . INCISION AND DRAINAGE OF WOUND Right 05/22/2018   Procedure: IRRIGATION AND DEBRIDEMENT RIGHT ARM WITH REMOVAL FOREIGN BODY;  Surgeon: [name], MD;  Location: [facility];  Service: Orthopedics;  Laterality: Right;  . TUBAL LIGATION  2000    Family History  Problem Relation Age of Onset  . Hypertension Father   . Diabetes Maternal Grandmother     Social History   Tobacco Use  . Smoking status: Current Every Day Smoker    Packs/day: 1.00    Types: Cigarettes  . Smokeless tobacco: Former Network engineer Use Topics  . Alcohol use: No  . Drug use: Yes    Types: Cocaine, Heroin, IV    No current facility-administered medications for this encounter.   Current Outpatient Medications:  .  albuterol (VENTOLIN HFA) 108 (90 Base) MCG/ACT inhaler, Inhale 2 puffs into the lungs every 6 (six) hours as needed for  wheezing (wheezing)., Disp: 6.7 g, Rfl: 3 .  gabapentin (NEURONTIN) 300 MG capsule, Take 1 capsule (300 mg total) by mouth 3 (three) times daily., Disp: 90 capsule, Rfl: 3 .  methadone (DOLOPHINE) 10 MG/5ML solution, Take 100 mg by mouth daily. [facility]., Disp: , Rfl:  .  OLANZapine (ZYPREXA) 10 MG tablet, Take 1 tablet (10 mg total) by mouth at bedtime., Disp: 30 tablet, Rfl: 3 .  cetirizine (ZYRTEC) 10 MG tablet, Take 1 tablet (10 mg total) by mouth daily., Disp: 30 tablet, Rfl: 11 .  fluticasone (FLONASE) 50 MCG/ACT nasal spray, Place 2 sprays into both nostrils daily., Disp: 16 g, Rfl: 0 .  hydrocortisone cream 0.5 %, Apply 1 application topically 2 (two) times daily., Disp: 30 g, Rfl: 2 .  ibuprofen (ADVIL,MOTRIN) 800 MG tablet, Take 1 tablet (800 mg total) by mouth every 8 (eight) hours as needed., Disp: 30 tablet, Rfl: 3 .  meclizine (ANTIVERT) 25 MG tablet, Take 1 tablet (25 mg total) by mouth 3 (three) times daily as needed for dizziness., Disp: 30 tablet, Rfl: 3 .  meloxicam (MOBIC) 7.5 MG tablet, Take 2 tablets (15 mg total) by mouth daily., Disp: 30 tablet, Rfl: 3 .  mupirocin ointment (BACTROBAN) 2 %, Apply 1 application topically 3 (three) times daily., Disp: 22 g, Rfl: 0 .  senna (SENOKOT) 8.6  MG TABS tablet, Take 1 tablet (8.6 mg total) by mouth at bedtime., Disp: 30 tablet, Rfl: 3  Allergies  Allergen Reactions  . Amitriptyline Hcl Other (See Comments)    Face Swelling     ROS  As noted in HPI.   Physical Exam  BP 113/68 (BP Location: Left Arm)   Pulse 67   Temp 98.3 F (36.8 C) (Oral)   Resp 16   SpO2 99%   Constitutional: Well developed, well nourished, no acute distress Eyes:  EOMI, conjunctiva normal bilaterally HENT: Normocephalic, atraumatic,mucus membranes moist.  Right TM normal.  Positive nontender scabs left external ear in the [place].  No surrounding erythema, tenderness.  No pain with traction on pinna, palpation of tragus, palpation  of mastoid.  Left EAC normal.  Left TM normal, intact.  No evidence of infection.  No air-fluid levels.  Positive mild nasal congestion worse on the left.  No sinus tenderness.  Grossly decreased hearing on the left compared to the right. Respiratory: Normal inspiratory effort Cardiovascular: Normal rate GI: nondistended skin: No rash, skin intact Musculoskeletal: no deformities Neurologic: Alert & oriented x 3, no focal neuro deficits Psychiatric: Speech and behavior appropriate   ED Course   Medications - No data to display  No orders of the defined types were placed in this encounter.   No results found for this or any previous visit (from the past 24 hour(s)). No results found.  ED Clinical Impression  1. Eustachian tube dysfunction, left   2. Abrasion of left ear, initial encounter      ED Assessment/Plan  Suspect eustachian tube dysfunction.  No evidence of perforation, infection.  In the differential is mass given duration of symptoms.  Discussed with patient that the next step is going to be follow-up with a specialist.  We can try some Flonase for the nasal congestion, and some Bactroban for the scabs on her external ear. Will refer to [facility].  Discussed MDM, treatment plan, and plan for follow-up with patient. patient agrees with plan.   Meds ordered this encounter  Medications  . fluticasone (FLONASE) 50 MCG/ACT nasal spray    Sig: Place 2 sprays into both nostrils daily.    Dispense:  16 g    Refill:  0  . mupirocin ointment (BACTROBAN) 2 %    Sig: Apply 1 application topically 3 (three) times daily.    Dispense:  22 g    Refill:  0    *This clinic note was created using [employer]. Therefore, there may be occasional mistakes despite careful proofreading.   ?    Melynda Ripple, MD 02/18/19 1425

## 2019-02-18 NOTE — Discharge Instructions (Addendum)
I suspect eustachian tube dysfunction is causing your symptoms.  You need to follow-up with specialist to have this further explored.  There is no evidence of infection, earwax buildup at this time.  Use the Bactroban on the scratches of your ear.  Flonase to help with the nasal congestion.

## 2019-02-18 NOTE — ED Triage Notes (Signed)
Pt reports left ear pain and pressure for over one month.  Pt has had her ear cleaned before, but she feels like they never get it all.  She states she has tried to clean out her own ear with hydrogen peroxide and warm water.  Pt state she feels like there is water in there and she is having a lot of pressure.

## 2019-02-19 MED FILL — MOMETASONE FUROATE 50 MCG S: 50 | 60 days supply | Qty: 17 | Fill #0

## 2019-02-25 ENCOUNTER — Ambulatory Visit: Payer: Self-pay | Admitting: Family Medicine

## 2019-05-14 ENCOUNTER — Other Ambulatory Visit: Payer: Self-pay

## 2019-05-28 ENCOUNTER — Other Ambulatory Visit: Payer: Self-pay

## 2019-05-28 ENCOUNTER — Emergency Department (HOSPITAL_COMMUNITY)
Admission: EM | Admit: 2019-05-28 | Discharge: 2019-05-28 | Disposition: A | Discharge status: Home / Self Care | Payer: Self-pay | Attending: Emergency Medicine | Admitting: Emergency Medicine

## 2019-05-28 ENCOUNTER — Emergency Department (HOSPITAL_COMMUNITY): Payer: Self-pay

## 2019-05-28 ENCOUNTER — Encounter (HOSPITAL_COMMUNITY): Payer: Self-pay | Admitting: Emergency Medicine

## 2019-05-28 DIAGNOSIS — J45909 Unspecified asthma, uncomplicated: Secondary | ICD-10-CM | POA: Insufficient documentation

## 2019-05-28 DIAGNOSIS — Y999 Unspecified external cause status: Secondary | ICD-10-CM | POA: Insufficient documentation

## 2019-05-28 DIAGNOSIS — Y9389 Activity, other specified: Secondary | ICD-10-CM | POA: Insufficient documentation

## 2019-05-28 DIAGNOSIS — Z79899 Other long term (current) drug therapy: Secondary | ICD-10-CM | POA: Insufficient documentation

## 2019-05-28 DIAGNOSIS — S50851A Superficial foreign body of right forearm, initial encounter: Secondary | ICD-10-CM | POA: Insufficient documentation

## 2019-05-28 DIAGNOSIS — F17210 Nicotine dependence, cigarettes, uncomplicated: Secondary | ICD-10-CM | POA: Insufficient documentation

## 2019-05-28 DIAGNOSIS — Y929 Unspecified place or not applicable: Secondary | ICD-10-CM | POA: Insufficient documentation

## 2019-05-28 DIAGNOSIS — W458XXA Other foreign body or object entering through skin, initial encounter: Secondary | ICD-10-CM | POA: Insufficient documentation
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
## 2019-05-28 NOTE — Discharge Instructions (Addendum)
Follow-up with orthopedics to discuss having the foreign body removed.  This will likely need to be done under fluoroscopy.  The contact information for [facility] has been provided in this discharge summary for you to call and make these arrangements.
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
## 2019-06-02 ENCOUNTER — Telehealth (HOSPITAL_COMMUNITY): Payer: Self-pay | Admitting: Family Medicine

## 2019-06-02 NOTE — Telephone Encounter (Signed)
I called pt. No answer. Voicemail box full. Unable to leave a message.

## 2019-06-02 NOTE — Telephone Encounter (Signed)
-----   Message from Azzie Glatter, [place] sent at 05/31/2019 11:01 PM EST ----- Regarding: "Hospital Follow Up" Please schedule patient for Hospital Follow Up within the next month. Thank you.

## 2019-06-03 ENCOUNTER — Telehealth: Payer: Self-pay | Admitting: Family Medicine

## 2019-06-03 NOTE — Telephone Encounter (Signed)
Called pt. No answer, but left message to call back for a 1 month follow up.

## 2019-08-21 ENCOUNTER — Ambulatory Visit
Admission: EM | Admit: 2019-08-21 | Discharge: 2019-08-21 | Disposition: A | Discharge status: Home / Self Care | Payer: Self-pay | Attending: Emergency Medicine | Admitting: Emergency Medicine

## 2019-08-21 DIAGNOSIS — G8929 Other chronic pain: Secondary | ICD-10-CM

## 2019-08-21 DIAGNOSIS — H9203 Otalgia, bilateral: Secondary | ICD-10-CM

## 2019-08-21 DIAGNOSIS — H00011 Hordeolum externum right upper eyelid: Secondary | ICD-10-CM

## 2019-08-21 MED ORDER — AMOXICILLIN-POT CLAVULANATE 875-125 MG PO TABS: 1.0000 | ORAL_TABLET | Freq: Two times a day (BID) | ORAL | 0 refills | Status: DC

## 2019-08-21 NOTE — Discharge Instructions (Addendum)
Take antibiotic twice daily with food. May take Tylenol as needed for additional pain. Do not put anything in your ear. Apply hot compresses to right eyelid 3-5 times daily. Return for worsening swelling, pain, fever.

## 2019-08-21 NOTE — ED Triage Notes (Signed)
Pt c/o lt ear fullness x2 months, states both ears needs to be cleaned out. Pt c/o rt eye swelling and a knot under upper eye lid x58months.
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
## 2020-07-14 ENCOUNTER — Ambulatory Visit (HOSPITAL_COMMUNITY)
Admission: EM | Admit: 2020-07-14 | Discharge: 2020-07-14 | Disposition: A | Discharge status: Home / Self Care | Payer: Self-pay

## 2020-07-14 ENCOUNTER — Other Ambulatory Visit: Payer: Self-pay

## 2020-07-14 NOTE — ED Triage Notes (Signed)
Called several times, not in waiting room

## 2020-07-15 ENCOUNTER — Encounter (HOSPITAL_COMMUNITY): Payer: Self-pay | Admitting: Emergency Medicine

## 2020-07-15 ENCOUNTER — Emergency Department (HOSPITAL_COMMUNITY)
Admission: EM | Admit: 2020-07-15 | Discharge: 2020-07-16 | Disposition: A | Discharge status: Home / Self Care | Payer: Self-pay | Attending: Emergency Medicine | Admitting: Emergency Medicine

## 2020-07-15 ENCOUNTER — Ambulatory Visit (HOSPITAL_COMMUNITY)
Admission: EM | Admit: 2020-07-15 | Discharge: 2020-07-15 | Disposition: A | Discharge status: Home / Self Care | Payer: Self-pay | Attending: Urgent Care | Admitting: Urgent Care

## 2020-07-15 ENCOUNTER — Other Ambulatory Visit: Payer: Self-pay

## 2020-07-15 DIAGNOSIS — R000 Tachycardia, unspecified: Secondary | ICD-10-CM

## 2020-07-15 DIAGNOSIS — R519 Headache, unspecified: Secondary | ICD-10-CM

## 2020-07-15 DIAGNOSIS — R238 Other skin changes: Secondary | ICD-10-CM | POA: Insufficient documentation

## 2020-07-15 DIAGNOSIS — K130 Diseases of lips: Secondary | ICD-10-CM | POA: Insufficient documentation

## 2020-07-15 DIAGNOSIS — K122 Cellulitis and abscess of mouth: Secondary | ICD-10-CM

## 2020-07-15 DIAGNOSIS — Z5321 Procedure and treatment not carried out due to patient leaving prior to being seen by health care provider: Secondary | ICD-10-CM | POA: Insufficient documentation

## 2020-07-15 MED ORDER — IBUPROFEN 800 MG PO TABS
ORAL_TABLET | ORAL | Status: AC
  Filled 2020-07-15: qty 1

## 2020-07-15 MED ORDER — IBUPROFEN 800 MG PO TABS
800.0000 mg | ORAL_TABLET | Freq: Once | ORAL | Status: AC
  Administered 2020-07-15: 800 mg via ORAL

## 2020-07-15 NOTE — ED Provider Notes (Signed)
[facility]   MRN: [id number] DOB: 1965-07-03  Subjective:   Linda Cervantes is a 55 y.o. female presenting for 2-day history of acute onset red painful sore over her lip.  She states that she tried to pop it but unfortunately it got worse.  It spread into the nasal labial space at which point, her husband started to use tweezers to try and pop it open.  It has worsened substantially since then.  Now has severe 10 out of 10 pain, facial swelling with erythema and a knot over the right lower jaw.  Patient states that she has been using ibuprofen 200mg  and is on methadone.  Has a history of IV drug abuse, narcotic dependence.  No current facility-administered medications for this encounter.  Current Outpatient Medications:  .  albuterol (VENTOLIN HFA) 108 (90 Base) MCG/ACT inhaler, Inhale 2 puffs into the lungs every 6 (six) hours as needed for wheezing (wheezing)., Disp: 6.7 g, Rfl: 3 .  amoxicillin-clavulanate (AUGMENTIN) 875-125 MG tablet, Take 1 tablet by mouth every 12 (twelve) hours., Disp: 14 tablet, Rfl: 0 .  cetirizine (ZYRTEC) 10 MG tablet, Take 1 tablet (10 mg total) by mouth daily., Disp: 30 tablet, Rfl: 11 .  gabapentin (NEURONTIN) 300 MG capsule, Take 1 capsule (300 mg total) by mouth 3 (three) times daily., Disp: 90 capsule, Rfl: 3 .  hydrocortisone cream 0.5 %, Apply 1 application topically 2 (two) times daily., Disp: 30 g, Rfl: 2 .  ibuprofen (ADVIL,MOTRIN) 800 MG tablet, Take 1 tablet (800 mg total) by mouth every 8 (eight) hours as needed., Disp: 30 tablet, Rfl: 3 .  meclizine (ANTIVERT) 25 MG tablet, Take 1 tablet (25 mg total) by mouth 3 (three) times daily as needed for dizziness., Disp: 30 tablet, Rfl: 3 .  meloxicam (MOBIC) 7.5 MG tablet, Take 2 tablets (15 mg total) by mouth daily., Disp: 30 tablet, Rfl: 3 .  mometasone (NASONEX) 50 MCG/ACT nasal spray, Place 2 sprays into the nose daily., Disp: 17 g, Rfl: 0 .  mupirocin ointment (BACTROBAN) 2 %, Apply  1 application topically 3 (three) times daily., Disp: 22 g, Rfl: 0 .  OLANZapine (ZYPREXA) 10 MG tablet, Take 1 tablet (10 mg total) by mouth at bedtime., Disp: 30 tablet, Rfl: 3 .  senna (SENOKOT) 8.6 MG TABS tablet, Take 1 tablet (8.6 mg total) by mouth at bedtime., Disp: 30 tablet, Rfl: 3   Allergies  Allergen Reactions  . Amitriptyline Hcl Other (See Comments)    Face Swelling    Past Medical History:  Diagnosis Date  . Anxiety   . Asthma   . Back pain   . Depression   . Endometriosis   . GERD (gastroesophageal reflux disease)   . Heart murmur   . 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 Substance abuse ([place]) Clean as of 2009   Crack cocaine     Past Surgical History:  Procedure Laterality Date  . CESAREAN SECTION    . FOOT SURGERY    . INCISION AND DRAINAGE OF WOUND Right 05/22/2018   Procedure: IRRIGATION AND DEBRIDEMENT RIGHT ARM WITH REMOVAL FOREIGN BODY;  Surgeon: [name], MD;  Location: [facility];  Service: Orthopedics;  Laterality: Right;  . TUBAL LIGATION  2000    Family History  Problem Relation Age of Onset  . Hypertension Father   . Diabetes Maternal Grandmother     Social History   Tobacco Use  . Smoking status: Current Every Day  Smoker    Packs/day: 1.00    Types: Cigarettes  . Smokeless tobacco: Former Network engineer Use Topics  . Alcohol use: No  . Drug use: Not Currently    Types: Cocaine, Heroin, IV    Comment: Heroin     ROS   Objective:   Vitals: BP (!) 151/87 (BP Location: Left Arm)   Pulse (!) 115   Temp 98.5 F (36.9 C) (Oral)   Resp 18   LMP  (LMP Unknown) Comment: approx 2 years   SpO2 96%   Physical Exam Constitutional:      General: She is not in acute distress.    Appearance: Normal appearance. She is well-developed. She is not ill-appearing, toxic-appearing or diaphoretic.  HENT:     Head: Normocephalic and atraumatic.      Nose: Nose normal.     Mouth/Throat:     Mouth: Mucous  membranes are moist.     Pharynx: Oropharynx is clear.   Eyes:     General: No scleral icterus.    Extraocular Movements: Extraocular movements intact.     Pupils: Pupils are equal, round, and reactive to light.  Cardiovascular:     Rate and Rhythm: Normal rate.  Pulmonary:     Effort: Pulmonary effort is normal.  Skin:    General: Skin is warm and dry.  Neurological:     General: No focal deficit present.     Mental Status: She is alert and oriented to person, place, and time.  Psychiatric:        Mood and Affect: Mood normal.        Behavior: Behavior normal.      Assessment and Plan :   PDMP not reviewed this encounter.  1. Abscess of oral tissue   2. Facial pain   3. Tachycardia     Patient is in need of a higher level of care than we can provide in the urgent care setting given possibility of the deep space orofacial abscess.  Discussed with patient that she would likely need imaging, labs and consideration for IV antibiotics.  Patient verbalizes understanding, refused IM Toradol in clinic.  Offered her ibuprofen especially in light of her history of IV drug abuse and narcotic dependence.  She is agreeable to this.  Contracts for safety and will report to the emergency room now.   Jaynee Eagles, [place] 07/15/20 1904

## 2020-07-15 NOTE — ED Provider Notes (Signed)
Patient placed in Quick Look pathway, seen and evaluated   Chief Complaint: abscess face  HPI:   Pt reports she picked a pimple and now it is swollen   ROS: no fever  Physical Exam:   Gen: No distress  Neuro: Awake and Alert  Skin: Warm    Focused Exam: wdwn  Swollen area upper lip,    Initiation of care has begun. The patient has been counseled on the process, plan, and necessity for staying for the completion/evaluation, and the remainder of the medical screening examination   Sidney Ace 07/15/20 2015    Breck Coons, MD 07/15/20 [phone]

## 2020-07-15 NOTE — ED Notes (Signed)
Patient is being discharged from the Urgent Care and sent to the Emergency Department via POV Per Jaynee Eagles, PA, patient is in need of higher level of care due to oral/facial abscess. Patient is aware and verbalizes understanding of plan of care.  Vitals:   07/15/20 1832  BP: (!) 151/87  Pulse: (!) 115  Resp: 18  Temp: 98.5 F (36.9 C)  SpO2: 96%

## 2020-07-15 NOTE — Discharge Instructions (Signed)
Please report to the emergency room now for further evaluation and intervention of your severe oral facial abscess. I suspect they will perform imaging, blood work and consider IM or IV antibiotics.

## 2020-07-15 NOTE — ED Triage Notes (Signed)
Pt is present today with a mouth lesion. Pt states that she noticed it two days ago

## 2020-07-15 NOTE — ED Triage Notes (Signed)
Patient reports worsening infected pimple at upper lip this week with redness/swelling and drainage , no fever or chills .

## 2020-07-16 NOTE — ED Notes (Signed)
Patient called several times for vitals with no response and not visible in lobby

## 2020-07-20 ENCOUNTER — Emergency Department (HOSPITAL_BASED_OUTPATIENT_CLINIC_OR_DEPARTMENT_OTHER)
Admission: EM | Admit: 2020-07-20 | Discharge: 2020-07-20 | Disposition: A | Discharge status: Home / Self Care | Payer: Self-pay | Attending: Emergency Medicine | Admitting: Emergency Medicine

## 2020-07-20 ENCOUNTER — Encounter (HOSPITAL_BASED_OUTPATIENT_CLINIC_OR_DEPARTMENT_OTHER): Payer: Self-pay

## 2020-07-20 ENCOUNTER — Other Ambulatory Visit: Payer: Self-pay

## 2020-07-20 DIAGNOSIS — F17210 Nicotine dependence, cigarettes, uncomplicated: Secondary | ICD-10-CM | POA: Insufficient documentation

## 2020-07-20 DIAGNOSIS — H66002 Acute suppurative otitis media without spontaneous rupture of ear drum, left ear: Secondary | ICD-10-CM | POA: Insufficient documentation

## 2020-07-20 DIAGNOSIS — J45909 Unspecified asthma, uncomplicated: Secondary | ICD-10-CM | POA: Insufficient documentation

## 2020-07-20 MED ORDER — AMOXICILLIN-POT CLAVULANATE 875-125 MG PO TABS
1.0000 | ORAL_TABLET | Freq: Two times a day (BID) | ORAL | 0 refills | Status: AC
  Filled 2020-07-21: qty 20, 10d supply, fill #0

## 2020-07-20 NOTE — ED Triage Notes (Signed)
Patient presents POV from Home with Left Ear Pain  Pain has been present and worsening for "a few days".    No N/V. NAD, Ambulatory, GCS 15.
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
## 2020-07-21 ENCOUNTER — Other Ambulatory Visit: Payer: Self-pay

## 2020-08-24 ENCOUNTER — Emergency Department (HOSPITAL_BASED_OUTPATIENT_CLINIC_OR_DEPARTMENT_OTHER)
Admission: EM | Admit: 2020-08-24 | Discharge: 2020-08-24 | Disposition: A | Discharge status: Home / Self Care | Payer: Self-pay | Attending: Emergency Medicine | Admitting: Emergency Medicine

## 2020-08-24 ENCOUNTER — Other Ambulatory Visit: Payer: Self-pay

## 2020-08-24 ENCOUNTER — Encounter (HOSPITAL_BASED_OUTPATIENT_CLINIC_OR_DEPARTMENT_OTHER): Payer: Self-pay

## 2020-08-24 DIAGNOSIS — H9202 Otalgia, left ear: Secondary | ICD-10-CM | POA: Insufficient documentation

## 2020-08-24 DIAGNOSIS — F17210 Nicotine dependence, cigarettes, uncomplicated: Secondary | ICD-10-CM | POA: Insufficient documentation

## 2020-08-24 DIAGNOSIS — J45909 Unspecified asthma, uncomplicated: Secondary | ICD-10-CM | POA: Insufficient documentation

## 2020-08-24 NOTE — ED Triage Notes (Signed)
Patient arrives from home with c/o left ear pain. Patient states she had an ear infection several weeks ago and finished medications but pain has not gone away.  "I feel like there is something in there, I can't hear good in that ear"

## 2020-08-24 NOTE — ED Provider Notes (Signed)
[facility] Provider Note  CSN: [id number] Arrival date & time: 08/24/20 2101    History Chief Complaint  Patient presents with  . Ear Fullness    HPI  Linda Cervantes is a 55 y.o. female here for evaluation of L ear pain, recently seen for otitis and given Rx for Augmentin. Reports some initial improvement but still feeling ear fullness and some pain. She feels like something is in her ear.    Past Medical History:  Diagnosis Date  . Anxiety   . Asthma   . Back pain   . Depression   . Endometriosis   . GERD (gastroesophageal reflux disease)   . Heart murmur   . 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 Substance abuse ([place]) Clean as of 2009   Crack cocaine    Past Surgical History:  Procedure Laterality Date  . CESAREAN SECTION    . FOOT SURGERY    . INCISION AND DRAINAGE OF WOUND Right 05/22/2018   Procedure: IRRIGATION AND DEBRIDEMENT RIGHT ARM WITH REMOVAL FOREIGN BODY;  Surgeon: [name], MD;  Location: [facility];  Service: Orthopedics;  Laterality: Right;  . TUBAL LIGATION  2000    Family History  Problem Relation Age of Onset  . Hypertension Father   . Diabetes Maternal Grandmother     Social History   Tobacco Use  . Smoking status: Current Every Day Smoker    Packs/day: 1.00    Types: Cigarettes  . Smokeless tobacco: Former Network engineer Use Topics  . Alcohol use: No  . Drug use: Not Currently    Types: Cocaine, Heroin, IV    Comment: Heroin      Home Medications Prior to Admission medications   Medication Sig Start Date End Date Taking? Authorizing Provider  albuterol (VENTOLIN HFA) 108 (90 Base) MCG/ACT inhaler Inhale 2 puffs into the lungs every 6 (six) hours as needed for wheezing (wheezing). 11/13/18   Azzie Glatter, FNP  cetirizine (ZYRTEC) 10 MG tablet Take 1 tablet (10 mg total) by mouth daily. 02/06/18   Azzie Glatter, FNP  gabapentin (NEURONTIN) 300 MG capsule Take 1  capsule (300 mg total) by mouth 3 (three) times daily. 02/04/18   Azzie Glatter, FNP  hydrocortisone cream 0.5 % Apply 1 application topically 2 (two) times daily. 02/04/18   Azzie Glatter, FNP  ibuprofen (ADVIL,MOTRIN) 800 MG tablet Take 1 tablet (800 mg total) by mouth every 8 (eight) hours as needed. 04/15/18   Azzie Glatter, FNP  meclizine (ANTIVERT) 25 MG tablet Take 1 tablet (25 mg total) by mouth 3 (three) times daily as needed for dizziness. 04/15/18   Azzie Glatter, FNP  meloxicam (MOBIC) 7.5 MG tablet Take 2 tablets (15 mg total) by mouth daily. 04/15/18   Azzie Glatter, FNP  mometasone (NASONEX) 50 MCG/ACT nasal spray Place 2 sprays into the nose daily. 02/18/19   Melynda Ripple, MD  mupirocin ointment (BACTROBAN) 2 % Apply 1 application topically 3 (three) times daily. 02/18/19   Melynda Ripple, MD  OLANZapine (ZYPREXA) 10 MG tablet Take 1 tablet (10 mg total) by mouth at bedtime. 04/15/18   Azzie Glatter, FNP  senna (SENOKOT) 8.6 MG TABS tablet Take 1 tablet (8.6 mg total) by mouth at bedtime. 11/25/18   Azzie Glatter, FNP  fluticasone (FLONASE) 50 MCG/ACT nasal spray Place 2 sprays into both nostrils daily. 02/18/19 02/18/19  Melynda Ripple, MD     Allergies  Amitriptyline hcl   Review of Systems   Review of Systems  HENT: Positive for ear pain and hearing loss.      Physical Exam BP 130/88 (BP Location: Right Arm)   Pulse 80   Temp 98.7 F (37.1 C) (Oral)   Ht 5\' 3"  (1.6 m)   Wt 56.7 kg   LMP  (LMP Unknown) Comment: approx 2 years   BMI 22.14 kg/m   Physical Exam Vitals and nursing note reviewed.  HENT:     Head: Normocephalic.     Ears:     Comments: Both canals are narrow but open, no cerumen, TM on the R appears normal, TM on the left with some scar tissue but no signs of infection. No FB.     Nose: Nose normal.  Eyes:     Extraocular Movements: Extraocular movements intact.  Pulmonary:     Effort: Pulmonary effort is  normal.  Musculoskeletal:        General: Normal range of motion.     Cervical back: Neck supple.  Skin:    Findings: No rash (on exposed skin).  Neurological:     Mental Status: She is alert and oriented to person, place, and time.  Psychiatric:        Mood and Affect: Mood normal.      ED Results / Procedures / Treatments   Labs (all labs ordered are listed, but only abnormal results are displayed) Labs Reviewed - No data to display  EKG None   Radiology No results found.  Procedures Procedures  Medications Ordered in the ED Medications - No data to display   MDM Rules/Calculators/A&P MDM Otalgia without signs of acute infection. Recommend ENT follow up for further evaluation and management.  ED Course  I have reviewed the triage vital signs and the nursing notes.  Pertinent labs & imaging results that were available during my care of the patient were reviewed by me and considered in my medical decision making (see chart for details).     Final Clinical Impression(s) / ED Diagnoses Final diagnoses:  Left ear pain    Rx / DC Orders ED Discharge Orders    None       Truddie Hidden, MD 08/24/20 2215

## 2020-09-05 ENCOUNTER — Telehealth: Payer: Self-pay

## 2020-09-05 NOTE — Telephone Encounter (Signed)
Nasal spray  Inhaler Gabapentin Ibuprofen Olanzapine  [facility]

## 2020-09-08 ENCOUNTER — Other Ambulatory Visit: Payer: Self-pay

## 2020-09-08 NOTE — Telephone Encounter (Signed)
Patient has an appt in 7/21

## 2020-10-20 ENCOUNTER — Other Ambulatory Visit: Payer: Self-pay

## 2020-10-20 ENCOUNTER — Ambulatory Visit (INDEPENDENT_AMBULATORY_CARE_PROVIDER_SITE_OTHER): Payer: Self-pay | Admitting: Nurse Practitioner

## 2020-10-20 ENCOUNTER — Ambulatory Visit (HOSPITAL_COMMUNITY)
Admission: RE | Admit: 2020-10-20 | Discharge: 2020-10-20 | Disposition: A | Discharge status: Home / Self Care | Payer: Self-pay | Source: Ambulatory Visit | Attending: Nurse Practitioner | Admitting: Nurse Practitioner

## 2020-10-20 ENCOUNTER — Encounter: Payer: Self-pay | Admitting: Nurse Practitioner

## 2020-10-20 VITALS — BP 128/90 | HR 80 | Temp 97.2°F | Ht 63.0 in | Wt 122.0 lb

## 2020-10-20 DIAGNOSIS — M25511 Pain in right shoulder: Secondary | ICD-10-CM

## 2020-10-20 DIAGNOSIS — G8929 Other chronic pain: Secondary | ICD-10-CM | POA: Insufficient documentation

## 2020-10-20 DIAGNOSIS — M25512 Pain in left shoulder: Secondary | ICD-10-CM

## 2020-10-20 DIAGNOSIS — Z13828 Encounter for screening for other musculoskeletal disorder: Secondary | ICD-10-CM

## 2020-10-20 DIAGNOSIS — M79641 Pain in right hand: Secondary | ICD-10-CM

## 2020-10-20 DIAGNOSIS — R42 Dizziness and giddiness: Secondary | ICD-10-CM

## 2020-10-20 DIAGNOSIS — Z1211 Encounter for screening for malignant neoplasm of colon: Secondary | ICD-10-CM

## 2020-10-20 DIAGNOSIS — M5442 Lumbago with sciatica, left side: Secondary | ICD-10-CM

## 2020-10-20 DIAGNOSIS — Z13220 Encounter for screening for lipoid disorders: Secondary | ICD-10-CM

## 2020-10-20 DIAGNOSIS — M5441 Lumbago with sciatica, right side: Secondary | ICD-10-CM

## 2020-10-20 DIAGNOSIS — M542 Cervicalgia: Secondary | ICD-10-CM

## 2020-10-20 DIAGNOSIS — Z1329 Encounter for screening for other suspected endocrine disorder: Secondary | ICD-10-CM

## 2020-10-20 DIAGNOSIS — Z0000 Encounter for general adult medical examination without abnormal findings: Secondary | ICD-10-CM

## 2020-10-20 DIAGNOSIS — Z130 Encounter for screening for diseases of the blood and blood-forming organs and certain disorders involving the immune mechanism: Secondary | ICD-10-CM

## 2020-10-20 LAB — POCT URINALYSIS DIPSTICK
Bilirubin, UA: NEGATIVE
Glucose, UA: NEGATIVE
Ketones, UA: NEGATIVE
Leukocytes, UA: NEGATIVE
Nitrite, UA: NEGATIVE
Protein, UA: NEGATIVE
Spec Grav, UA: 1.02 (ref 1.010–1.025)
Urobilinogen, UA: 0.2 E.U./dL
pH, UA: 6.5 (ref 5.0–8.0)

## 2020-10-20 IMAGING — DX DG HAND COMPLETE 3+V*R*
3 series · 3 of 3 positions shown · non-contrast
Comparison: None.

CLINICAL DATA: Right hand pain and swelling.  No known injury.

EXAM:
RIGHT HAND - COMPLETE 3+ VIEW

[hand pa]
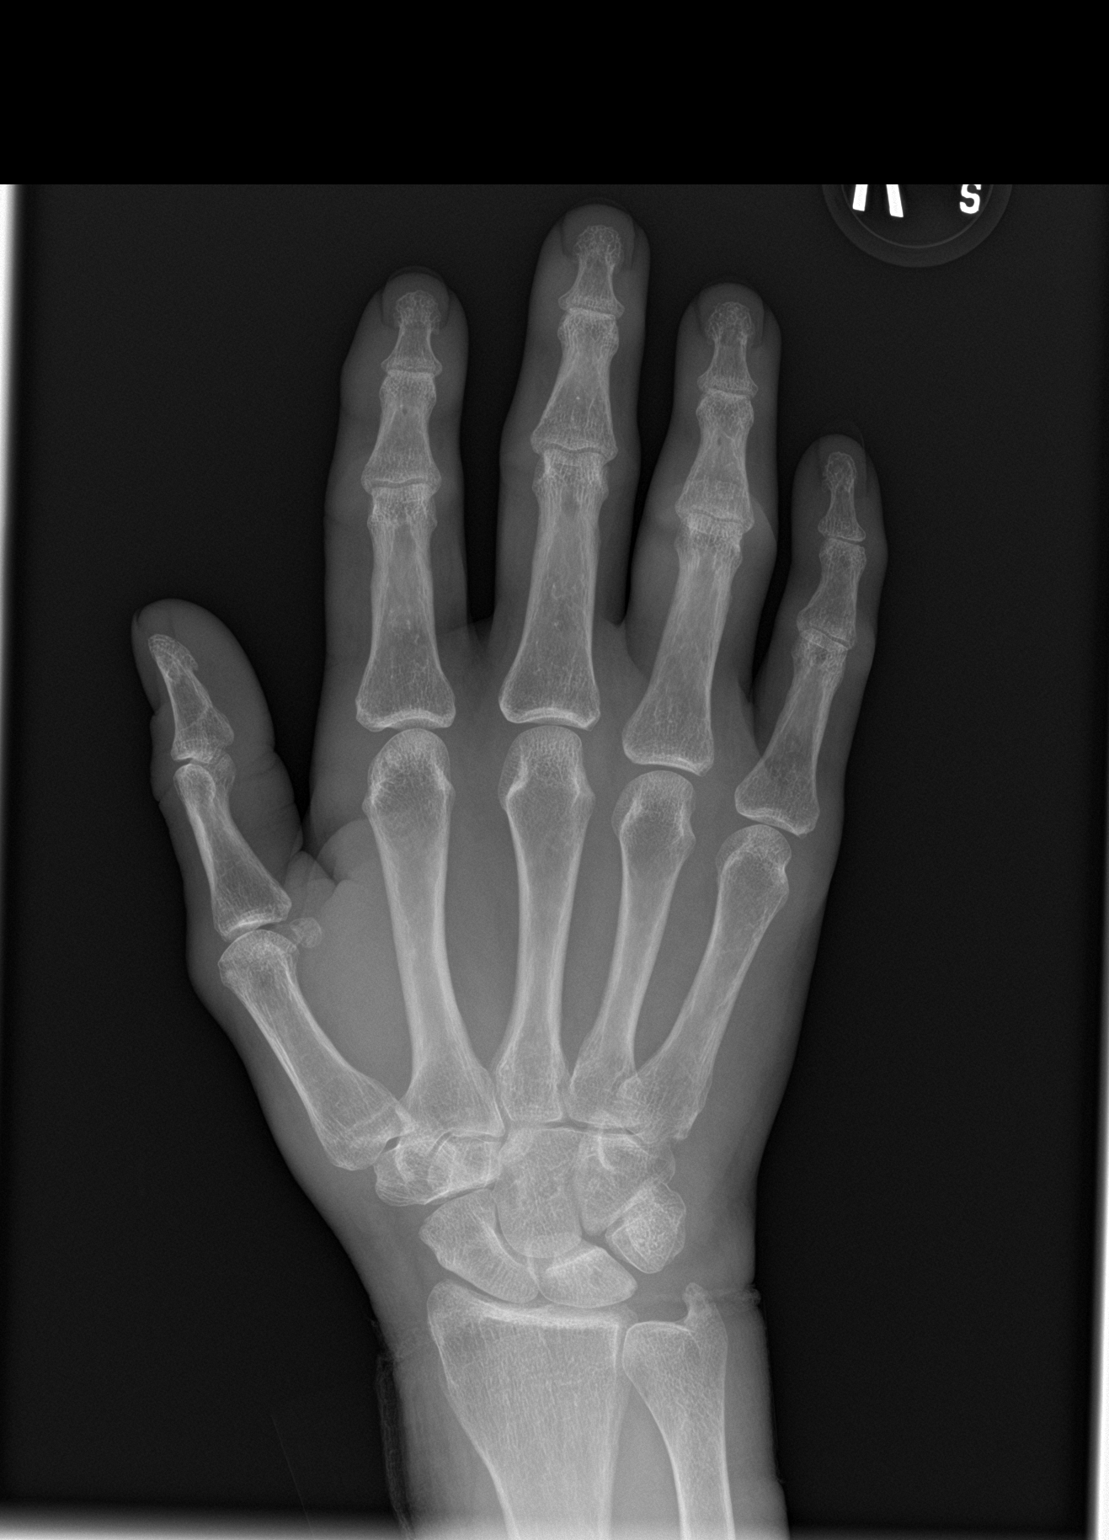

[hand obl]
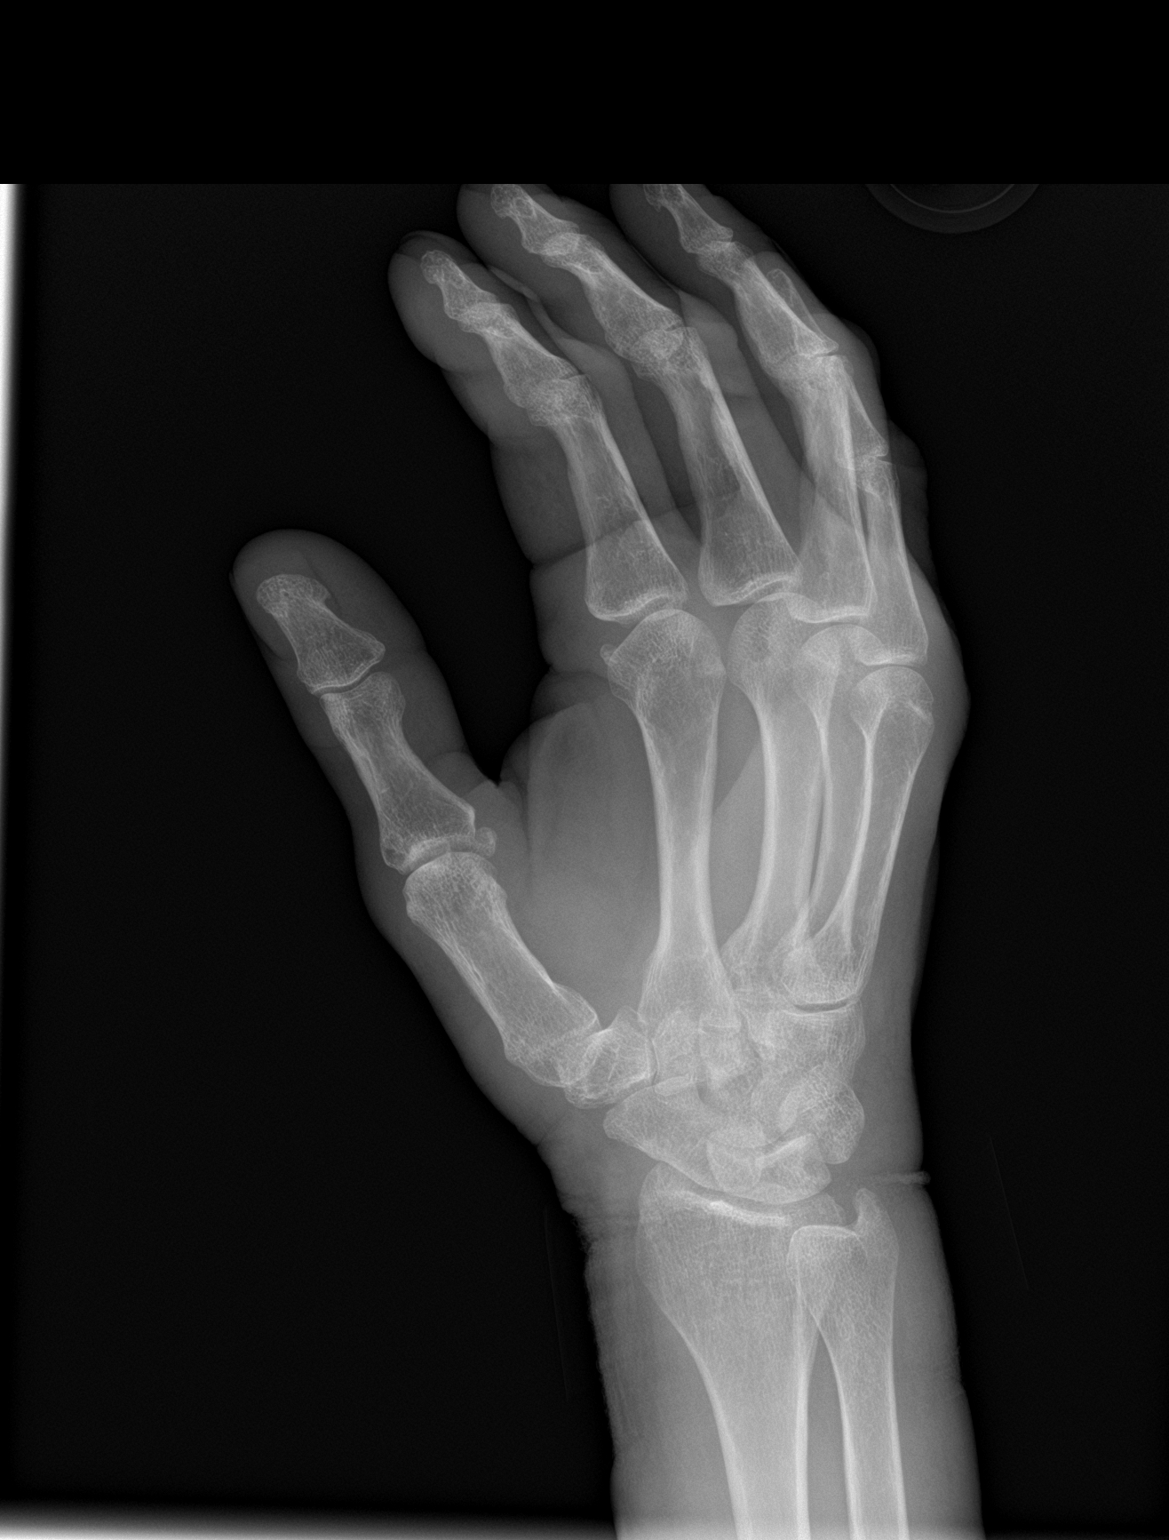

[hand lat]
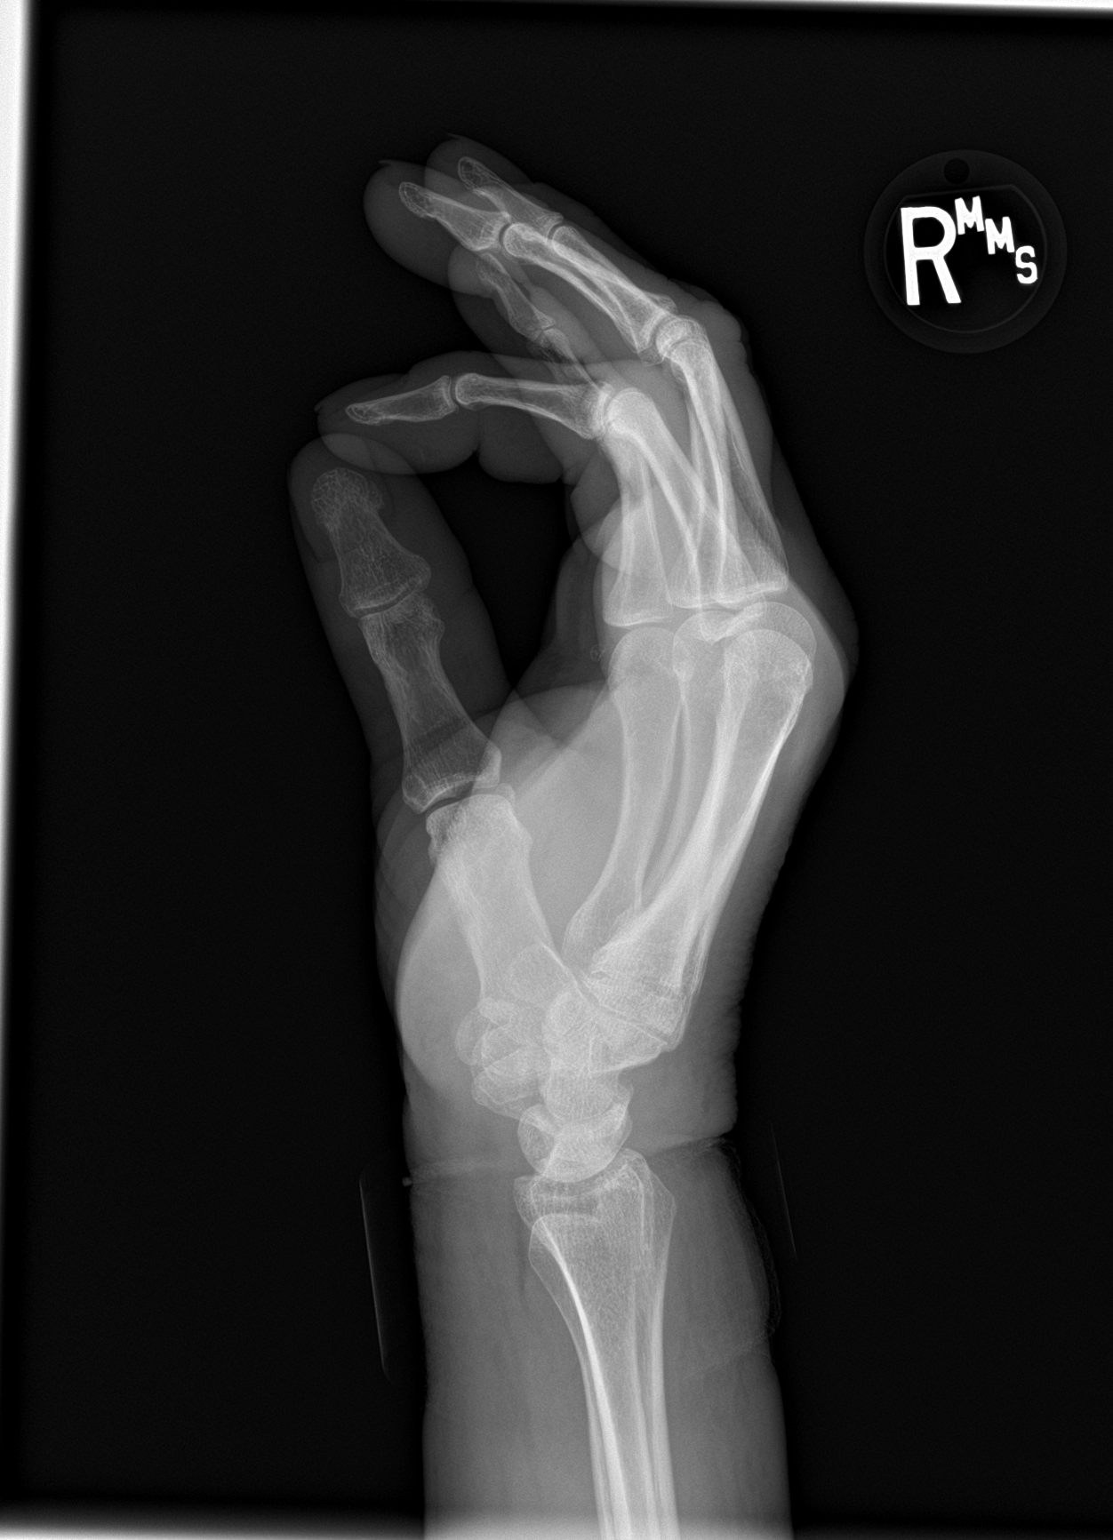

[3 of 3 positions shown; findings below may reference images not displayed]

FINDINGS: No acute bony or joint abnormality is identified. Joint spaces are
preserved. Soft tissues appear somewhat swollen. No soft tissue gas
or radiopaque foreign body.
IMPRESSION: Soft tissues appear somewhat swollen. The exam is otherwise
negative.

## 2020-10-20 IMAGING — DX DG SHOULDER 2+V*L*
3 series · 3 of 3 positions shown · non-contrast
Comparison: None.

CLINICAL DATA: Chronic bilateral shoulder pain.  No known injury.

EXAM:
RIGHT SHOULDER - 2+ VIEW; LEFT SHOULDER - 2+ VIEW

[shoulder grashey]
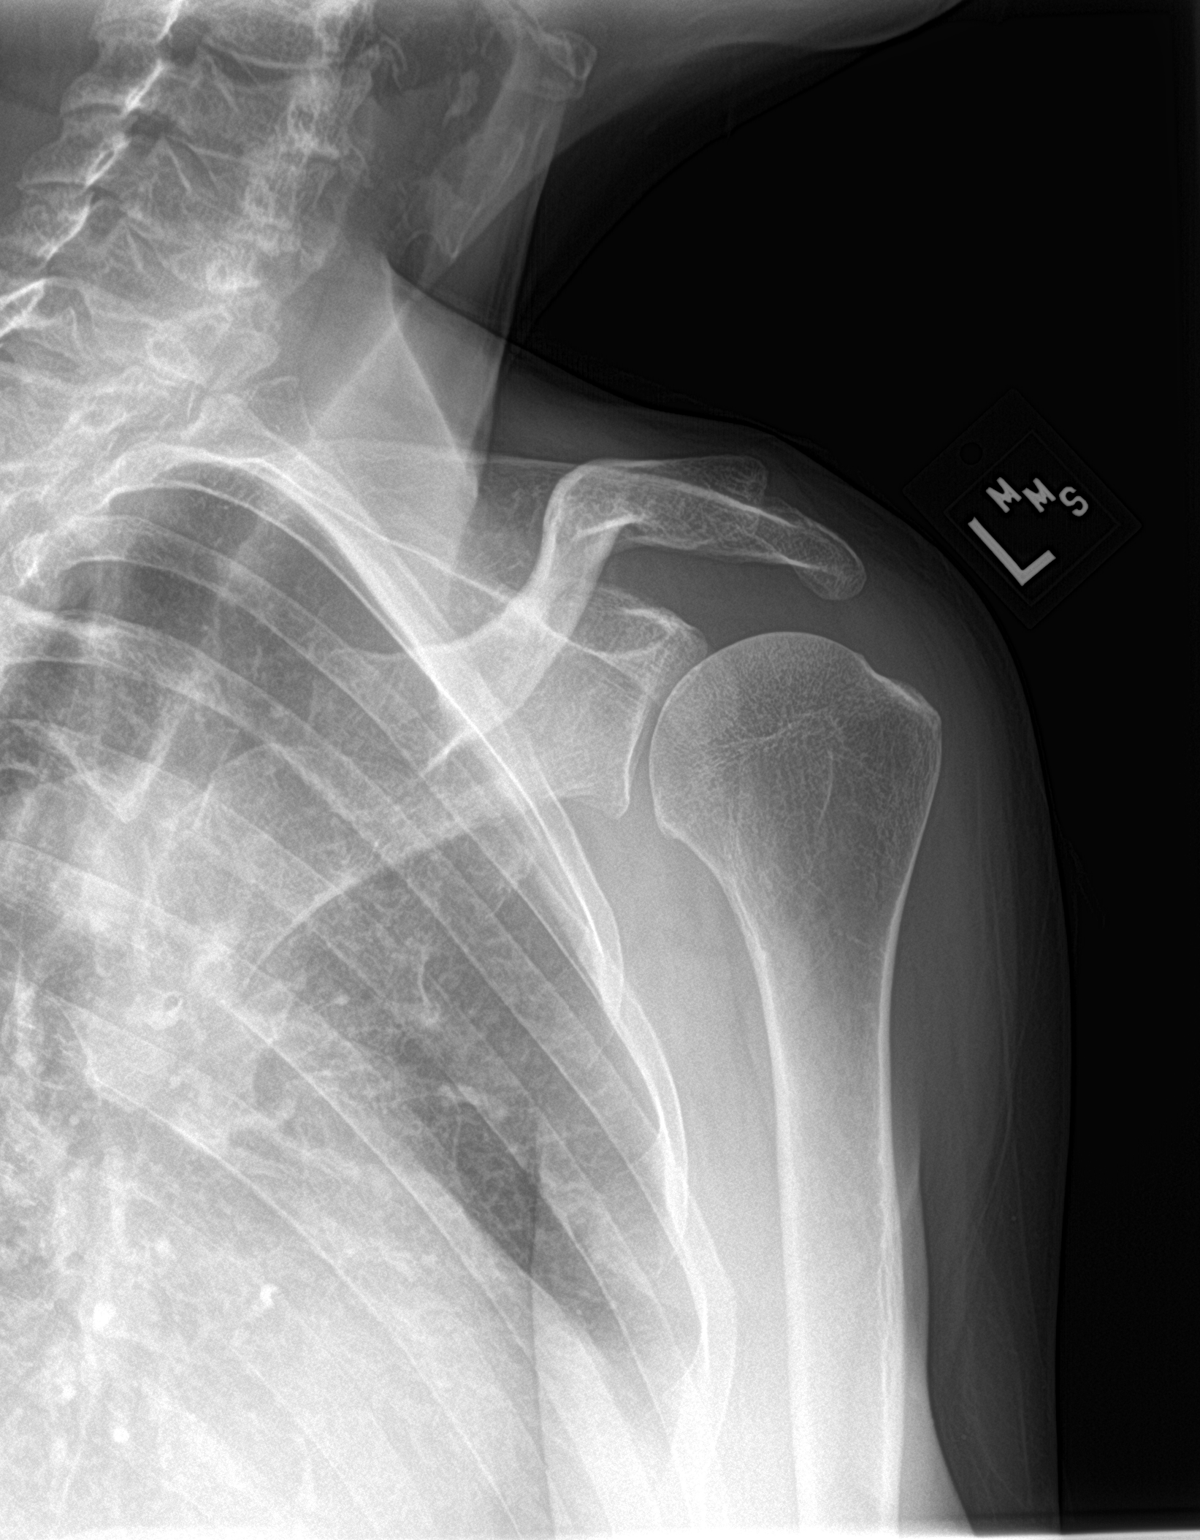

[shoulder axillary]
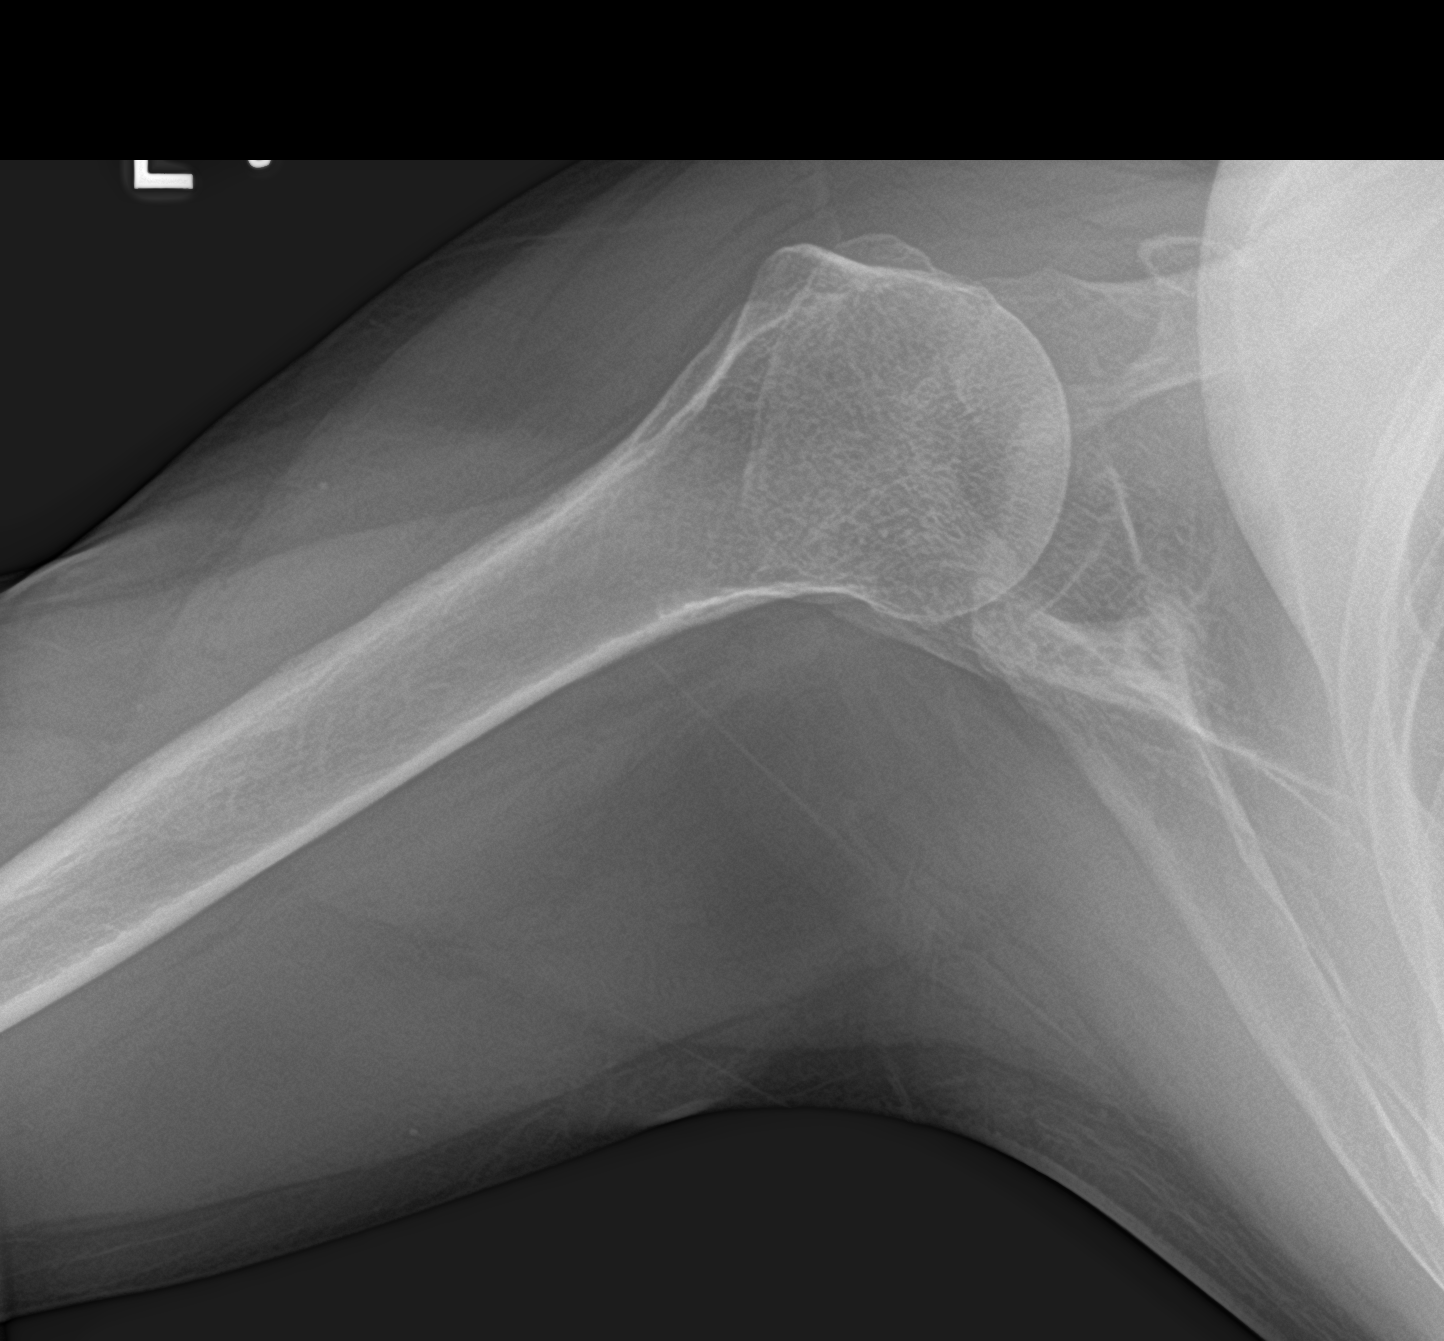

[shoulder y view]
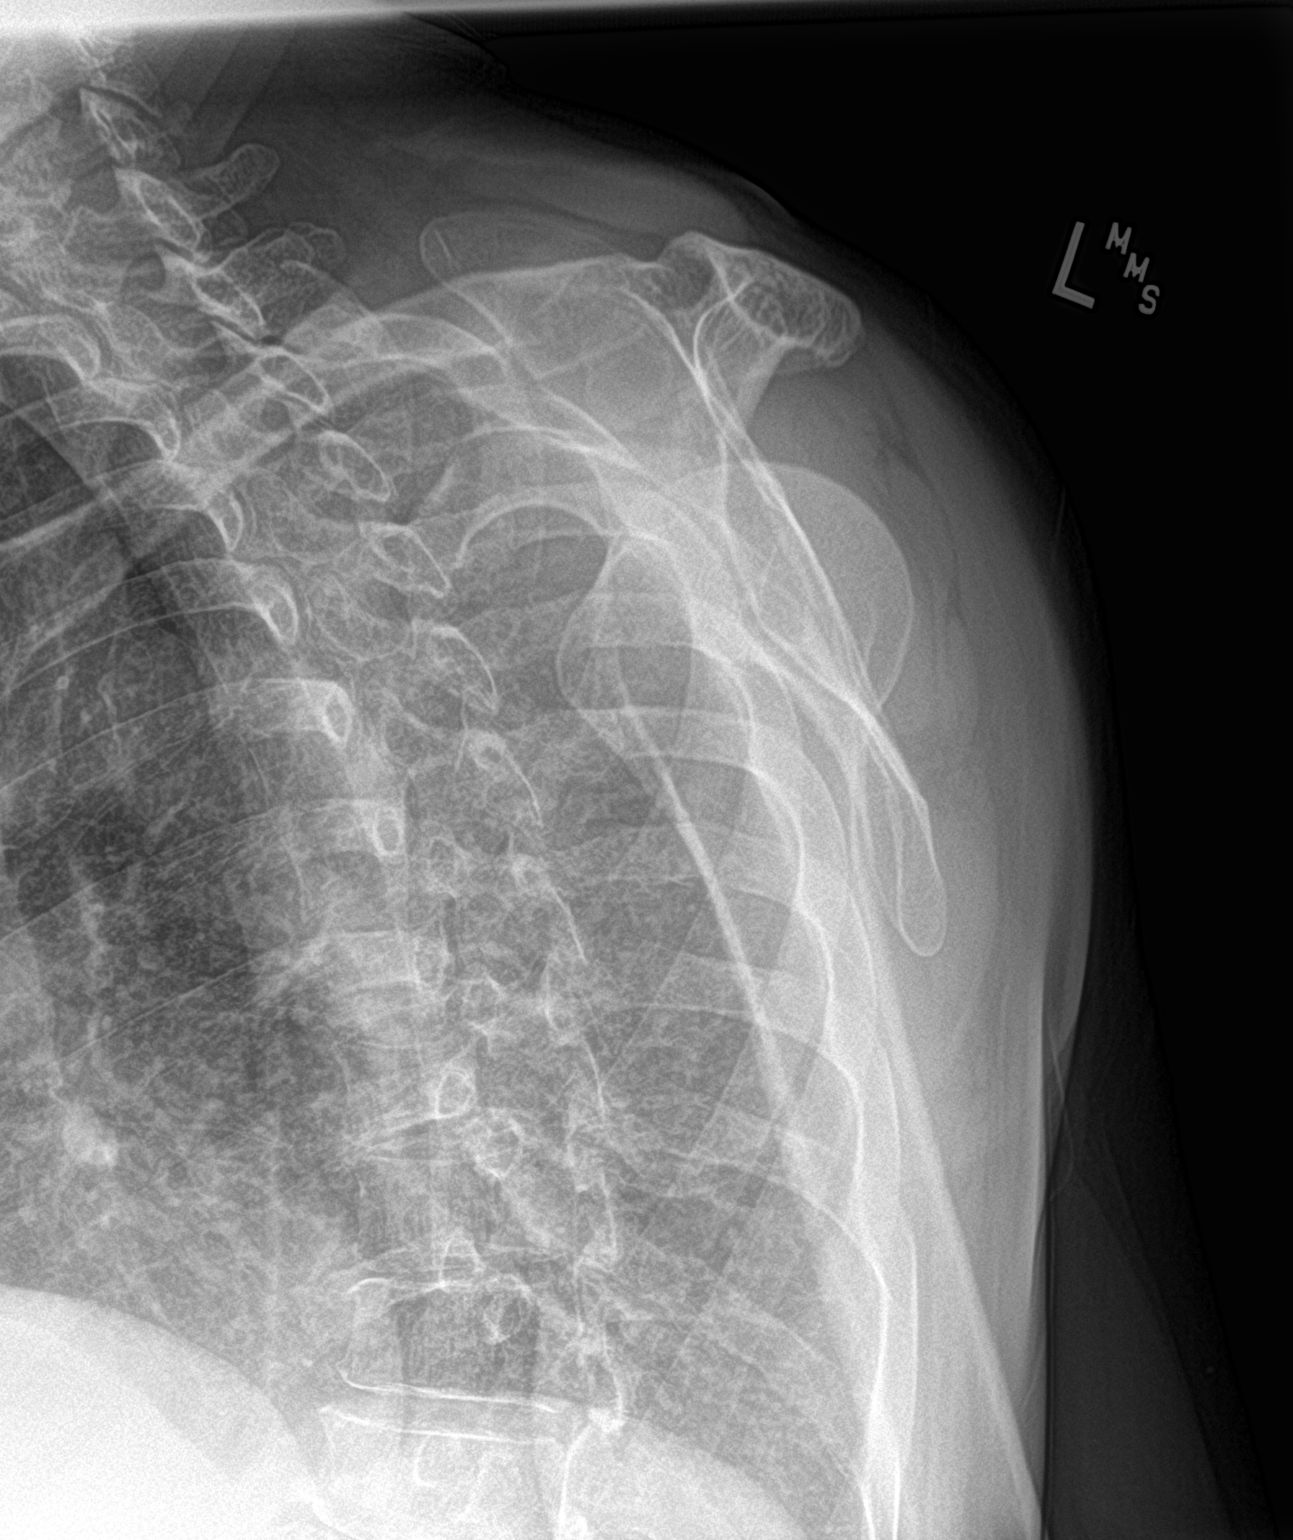

[3 of 3 positions shown; findings below may reference images not displayed]

FINDINGS: There is no evidence of fracture or dislocation. There is no
evidence of arthropathy or other focal bone abnormality. Soft
tissues are unremarkable.
IMPRESSION: Normal exam.

## 2020-10-20 IMAGING — DX DG LUMBAR SPINE COMPLETE 4+V
5 series · 5 of 5 positions shown · non-contrast
Comparison: Plain films lumbar spine [DATE].

CLINICAL DATA: Chronic low back pain.  No known injury.

EXAM:
LUMBAR SPINE - COMPLETE 4+ VIEW

[l-spine ap]
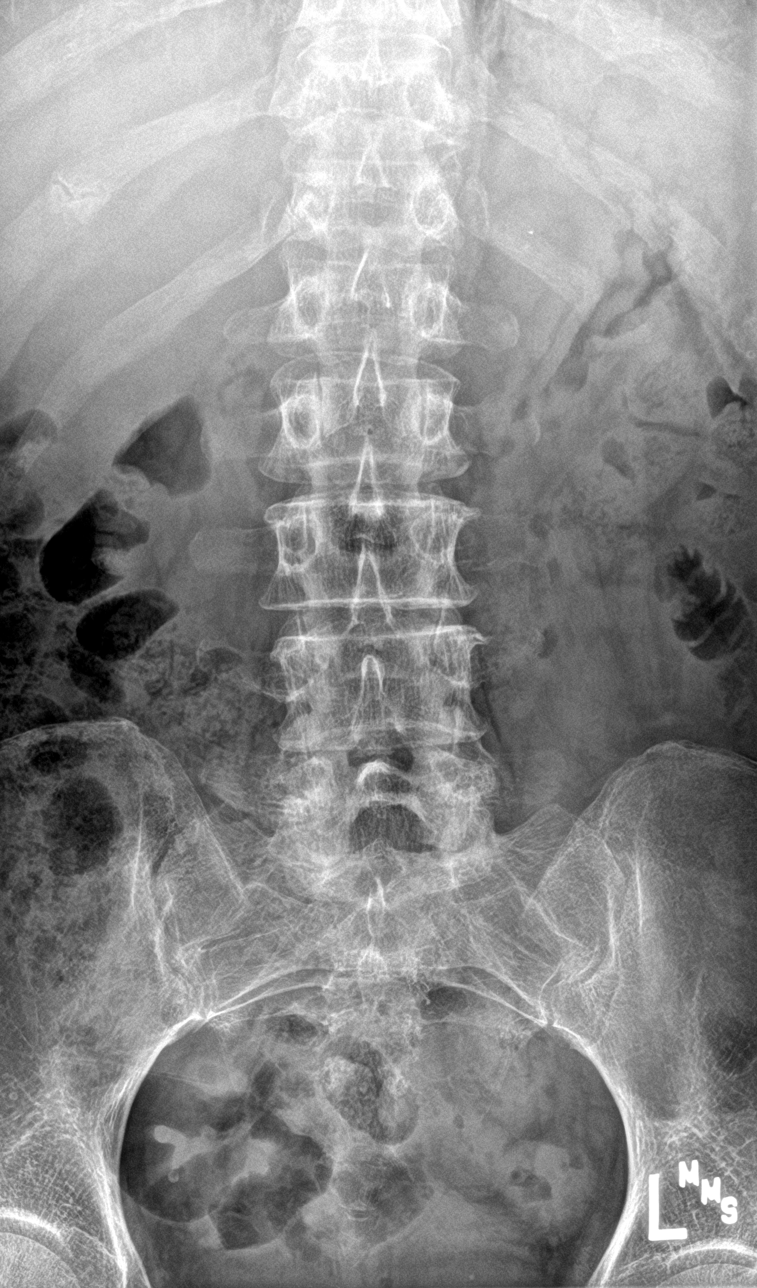

[l-spine obl (1 of 2)]
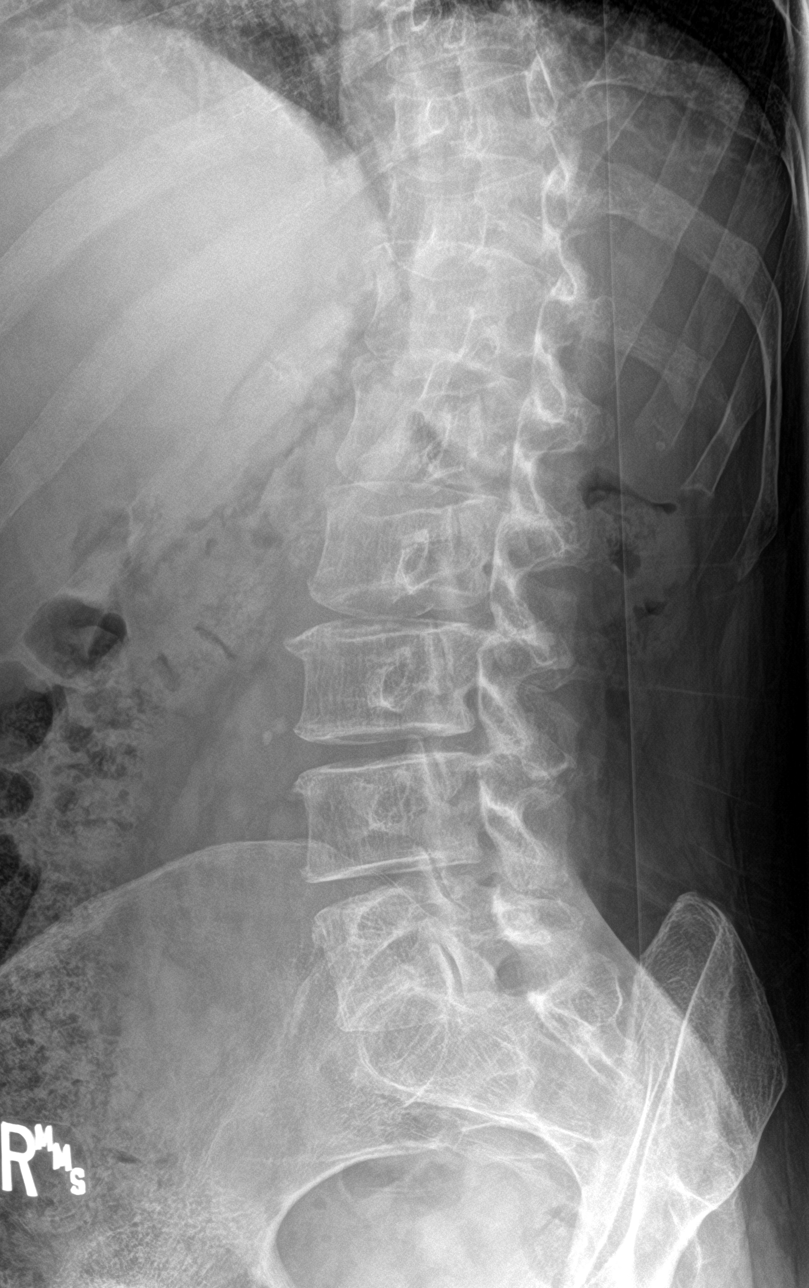

[l-spine obl (2 of 2)]
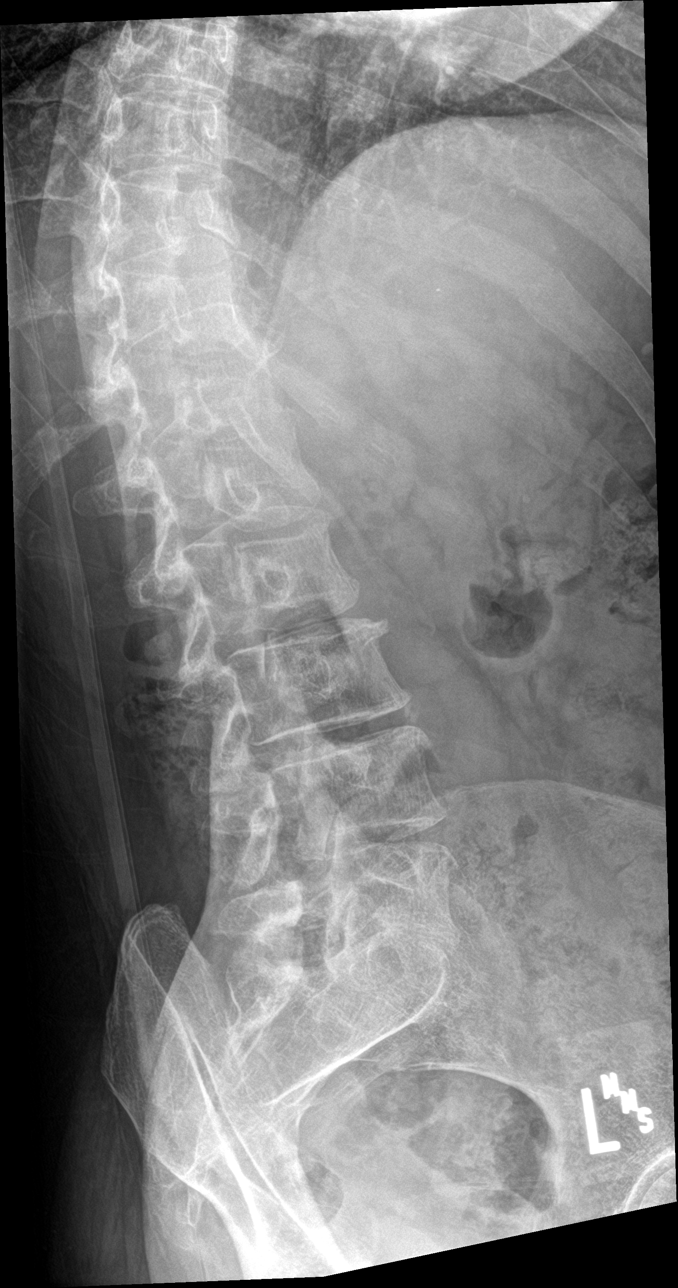

[l-spine lat]
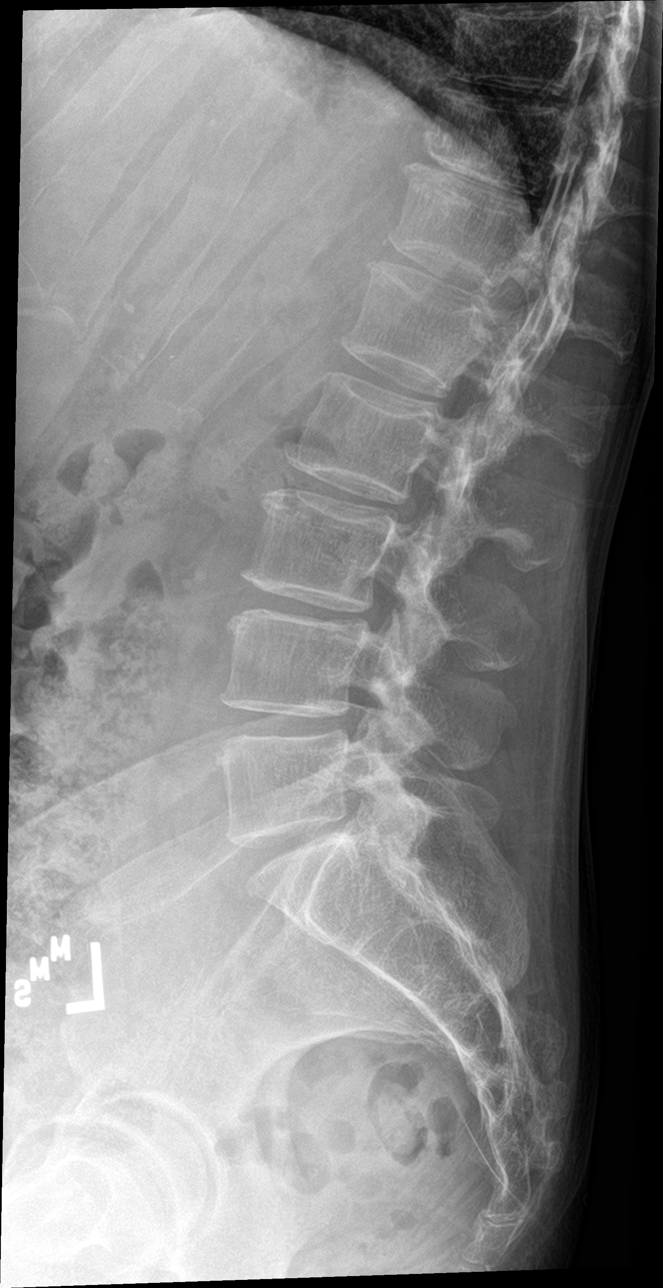

[l-spine spot]
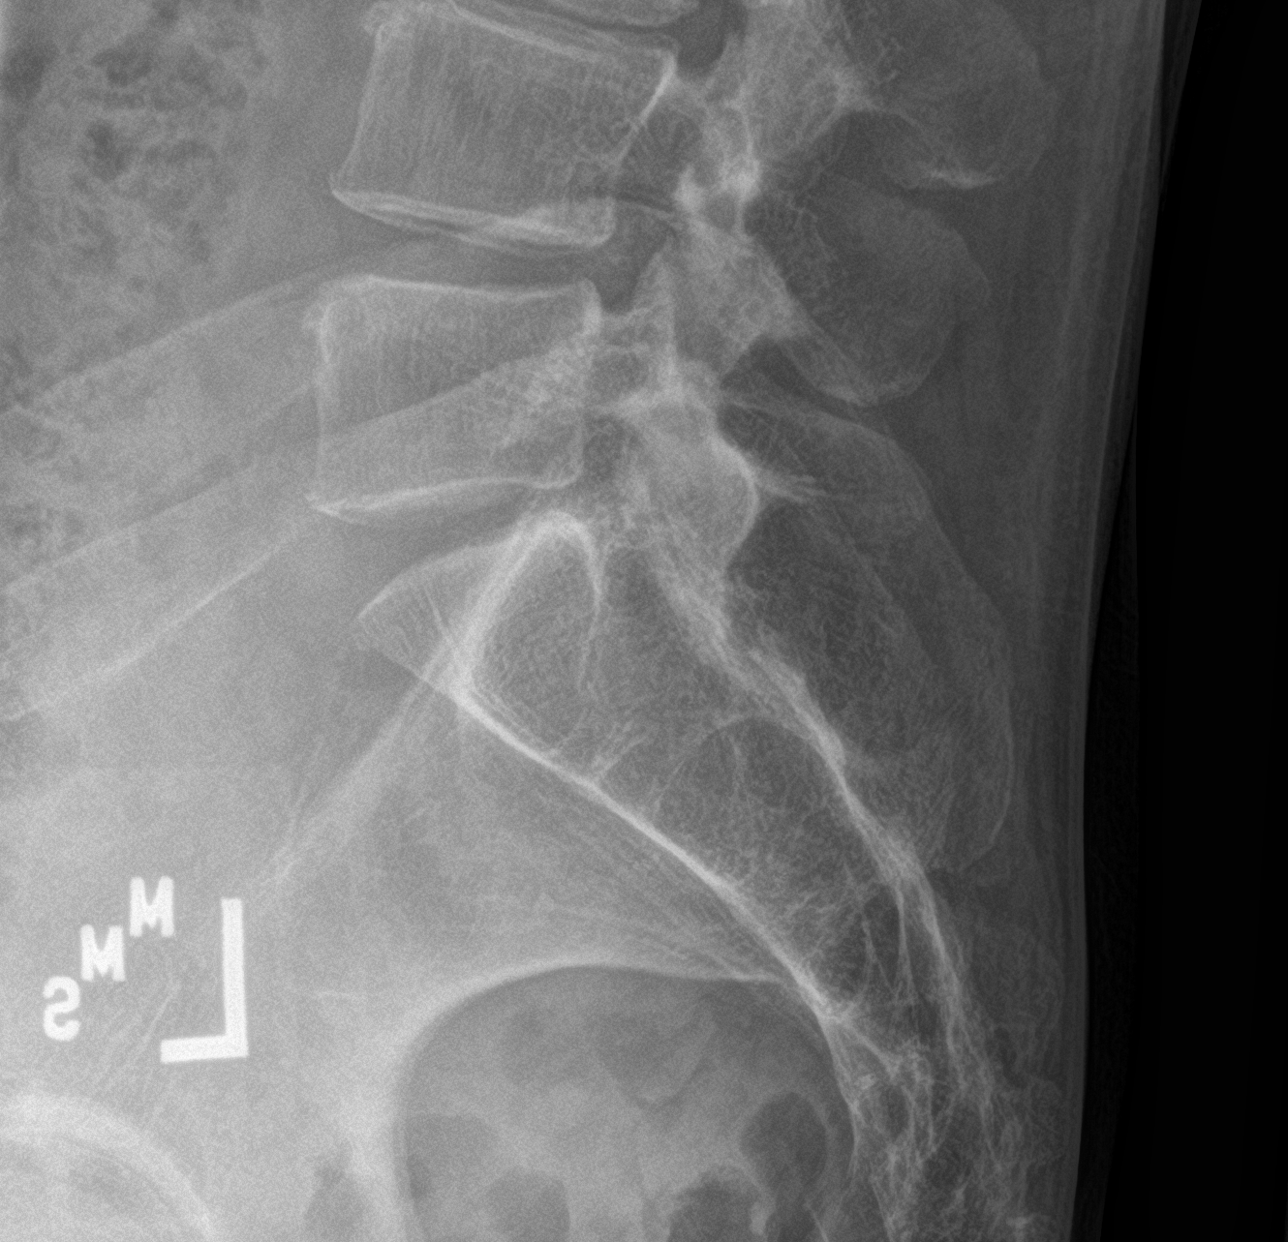

[5 of 5 positions shown; findings below may reference images not displayed]

FINDINGS: There is no evidence of lumbar spine fracture. Alignment is normal.
Intervertebral disc spaces are maintained. Mild anterior endplate
spurring shows some progression since the prior exam. Paraspinous
structures are negative.
IMPRESSION: Mild anterior endplate spurring is progressed since the prior exam.
The study is otherwise negative.

## 2020-10-20 IMAGING — DX DG SHOULDER 2+V*R*
3 series · 3 of 3 positions shown · non-contrast
Comparison: None.

CLINICAL DATA: Chronic bilateral shoulder pain.  No known injury.

EXAM:
RIGHT SHOULDER - 2+ VIEW; LEFT SHOULDER - 2+ VIEW

[shoulder grashey]
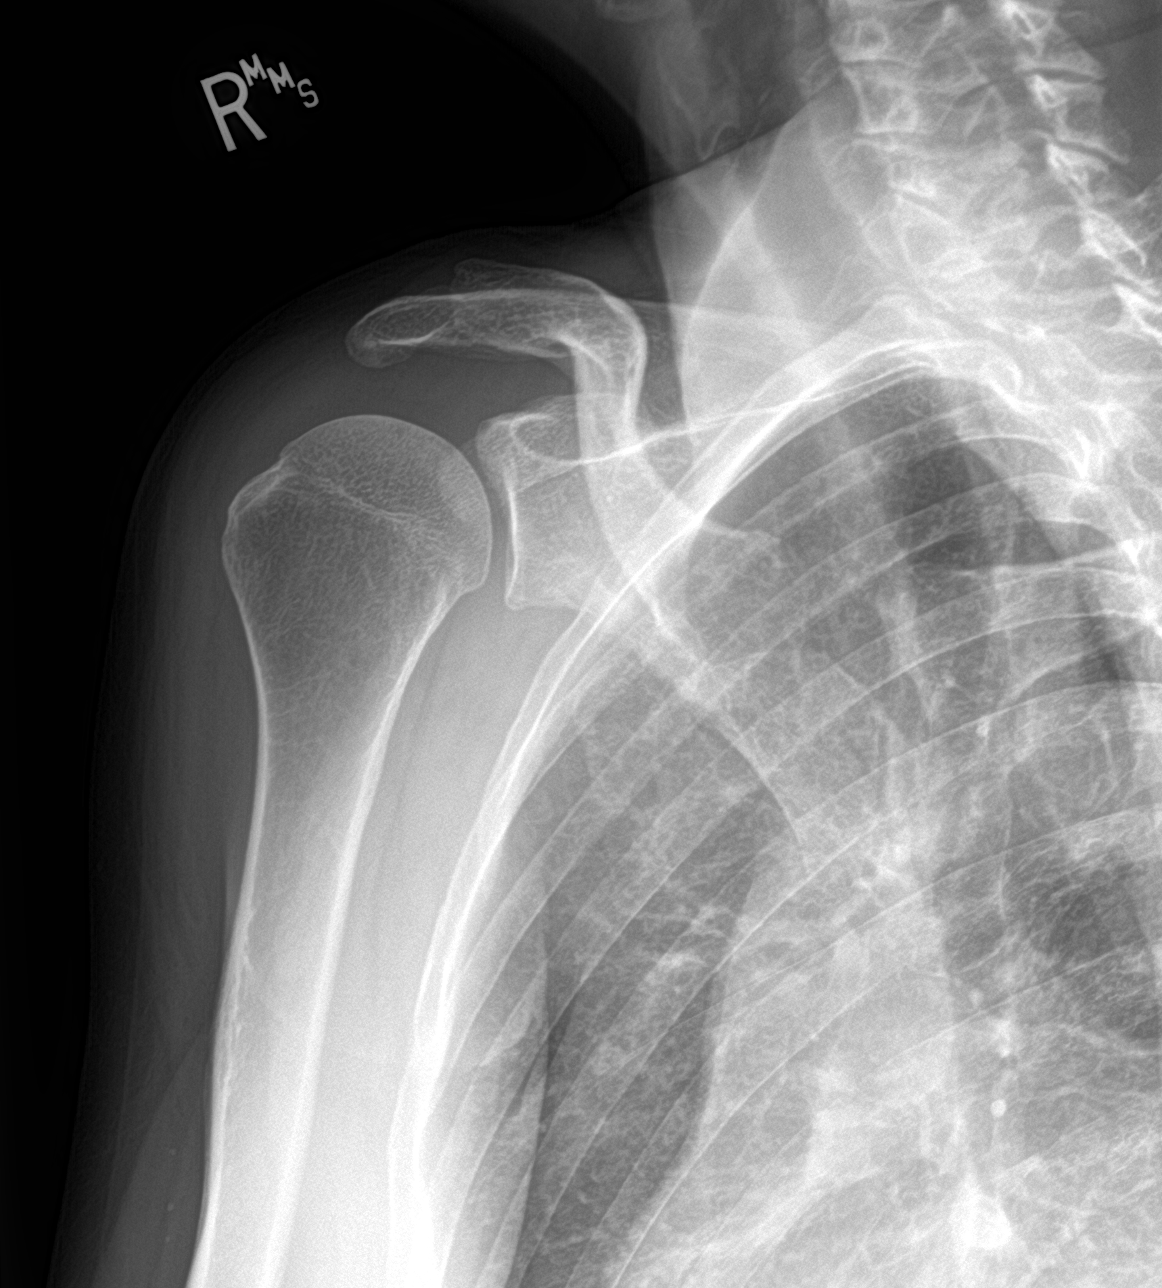

[shoulder y view]
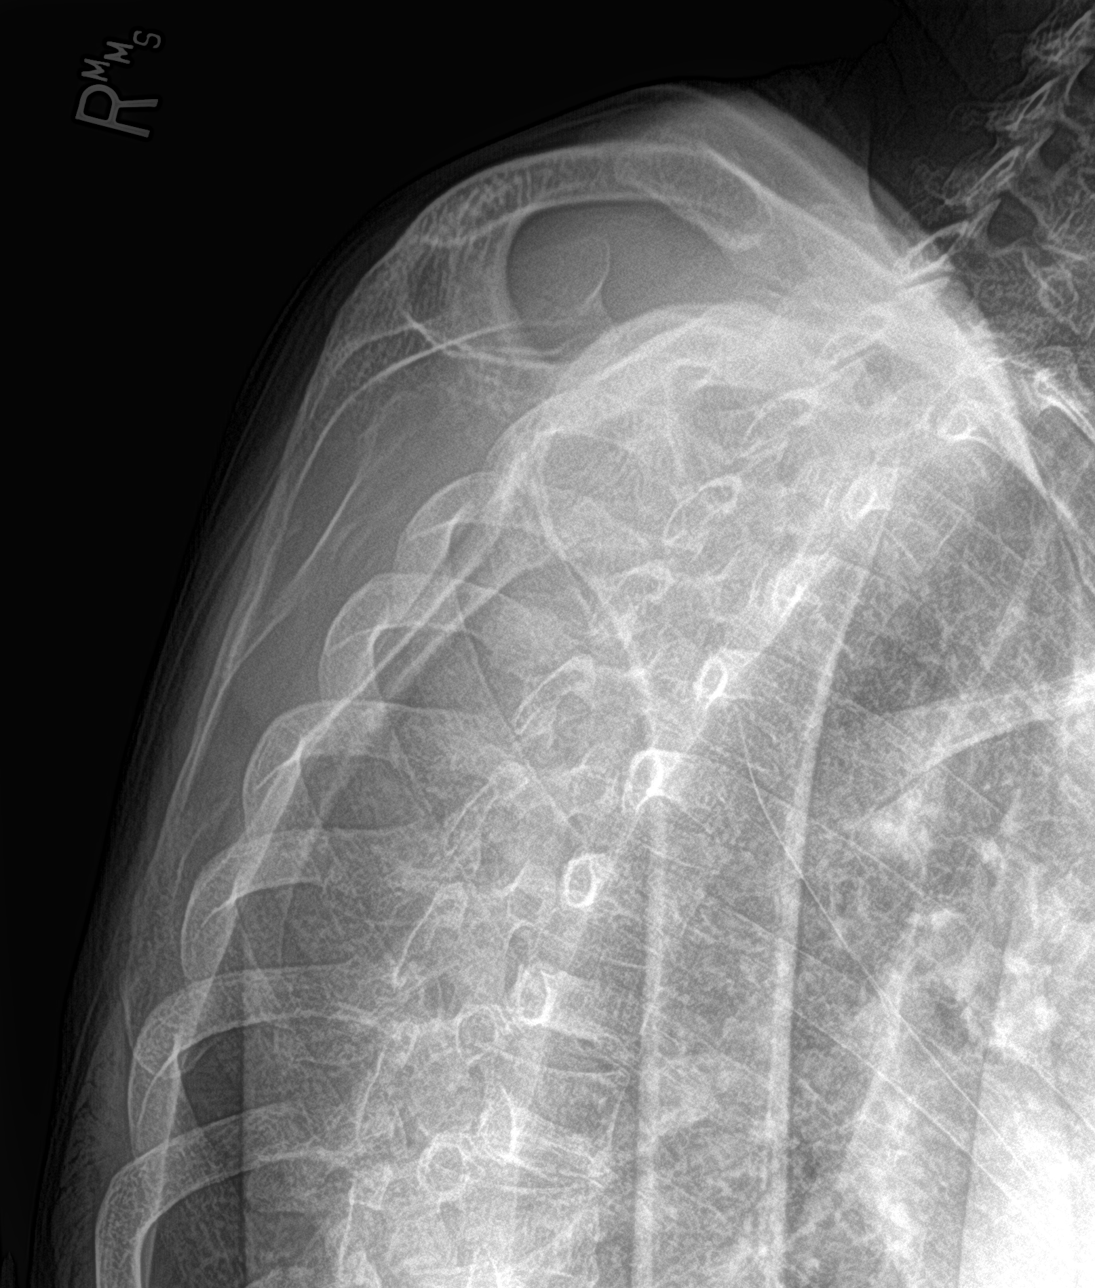

[shoulder axillary]
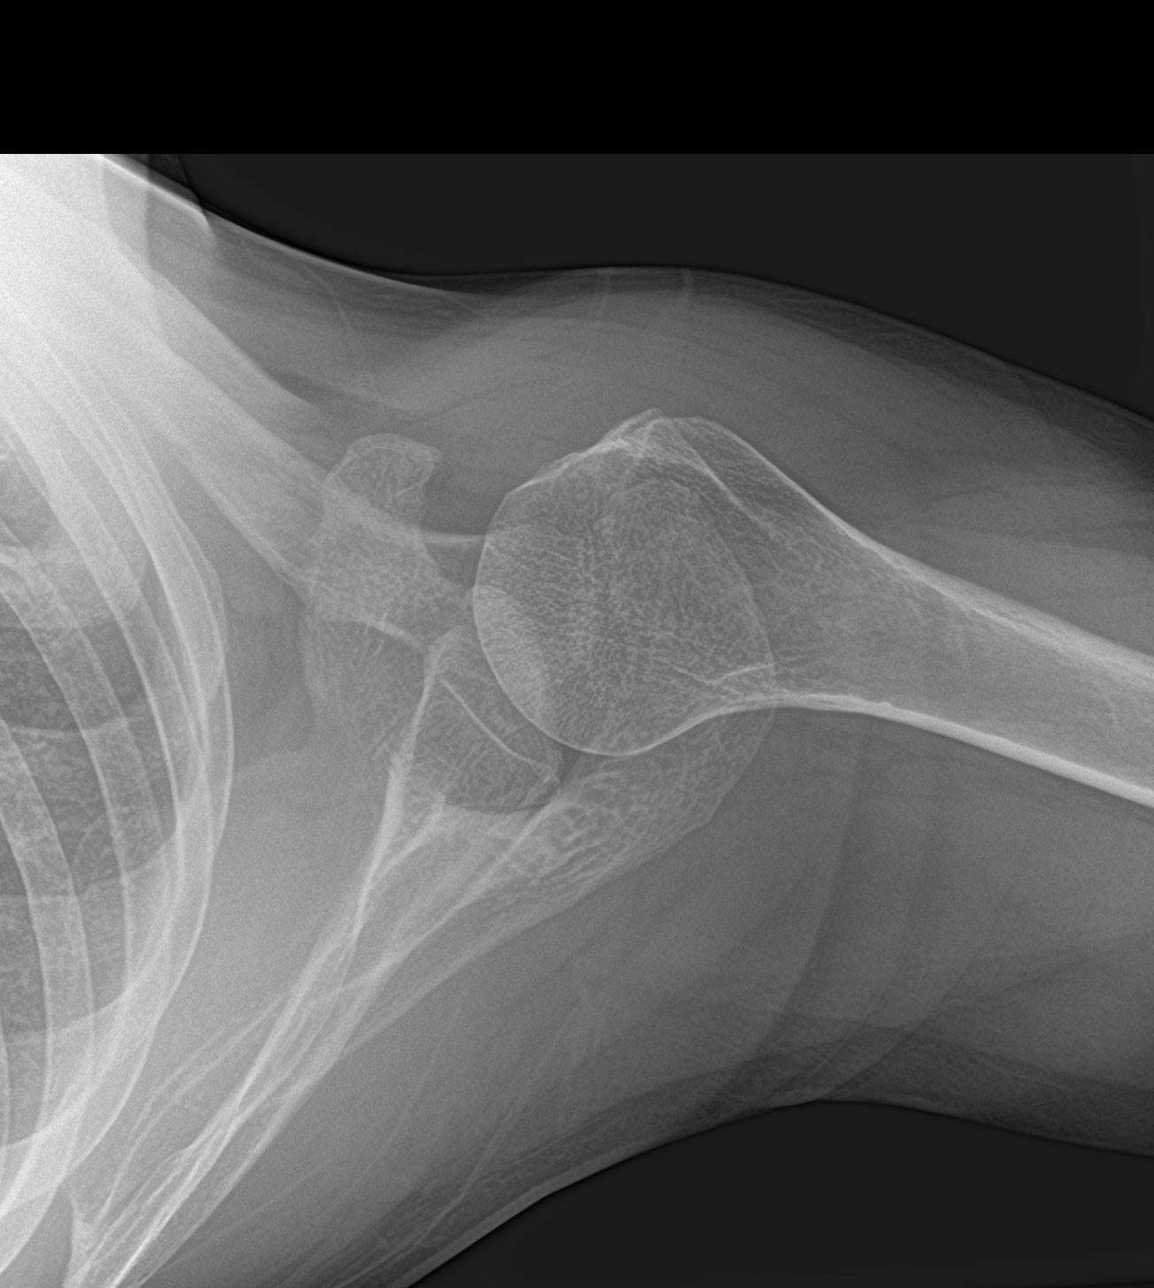

[3 of 3 positions shown; findings below may reference images not displayed]

FINDINGS: There is no evidence of fracture or dislocation. There is no
evidence of arthropathy or other focal bone abnormality. Soft
tissues are unremarkable.
IMPRESSION: Normal exam.

## 2020-10-20 MED ORDER — CETIRIZINE HCL 10 MG PO TABS
10.0000 mg | ORAL_TABLET | Freq: Every day | ORAL | 11 refills | Status: AC
  Filled 2020-10-20 – 2021-04-07 (×3): qty 30, 30d supply, fill #0
  Filled 2021-05-19 – 2021-06-13 (×2): qty 30, 30d supply, fill #1

## 2020-10-20 MED ORDER — OLANZAPINE 10 MG PO TABS
10.0000 mg | ORAL_TABLET | Freq: Every day | ORAL | 3 refills | Status: DC
  Filled 2020-10-20: qty 30, 30d supply, fill #0
  Filled 2021-02-10: qty 30, 30d supply, fill #1

## 2020-10-20 MED ORDER — IBUPROFEN 800 MG PO TABS
800.0000 mg | ORAL_TABLET | Freq: Three times a day (TID) | ORAL | 3 refills | Status: DC | PRN
  Filled 2020-10-20: qty 30, 10d supply, fill #0
  Filled 2021-02-10: qty 30, 10d supply, fill #1

## 2020-10-20 MED ORDER — MOMETASONE FUROATE 50 MCG/ACT NA SUSP
2.0000 | Freq: Every day | NASAL | 0 refills | Status: DC
  Filled 2020-10-20: qty 17, 30d supply, fill #0

## 2020-10-20 MED ORDER — POLYETHYLENE GLYCOL 3350 17 GM/SCOOP PO POWD
17.0000 g | Freq: Every day | ORAL | 0 refills | Status: AC
  Filled 2020-10-20: qty 510, 30d supply, fill #0

## 2020-10-20 MED ORDER — GABAPENTIN 300 MG PO CAPS
300.0000 mg | ORAL_CAPSULE | Freq: Three times a day (TID) | ORAL | 3 refills | Status: AC
  Filled 2020-10-20: qty 90, 30d supply, fill #0
  Filled 2021-02-10: qty 90, 30d supply, fill #1
  Filled 2021-04-06: qty 90, 30d supply, fill #2
  Filled 2021-04-07 – 2021-04-10 (×2): qty 90, 30d supply, fill #0
  Filled 2021-05-19 – 2021-06-13 (×2): qty 90, 30d supply, fill #1

## 2020-10-20 MED ORDER — MECLIZINE HCL 25 MG PO TABS
25.0000 mg | ORAL_TABLET | Freq: Three times a day (TID) | ORAL | 3 refills | Status: AC | PRN
  Filled 2020-10-20: qty 30, 10d supply, fill #0
  Filled 2021-04-06: qty 30, 10d supply, fill #1
  Filled 2021-04-07 – 2021-04-10 (×2): qty 30, 10d supply, fill #0
  Filled 2021-05-19 – 2021-06-13 (×2): qty 30, 10d supply, fill #1

## 2020-10-20 MED ORDER — ALBUTEROL SULFATE HFA 108 (90 BASE) MCG/ACT IN AERS
2.0000 | INHALATION_SPRAY | Freq: Four times a day (QID) | RESPIRATORY_TRACT | 3 refills | Status: AC | PRN
  Filled 2020-10-20: qty 6.7, 30d supply, fill #0
  Filled 2021-04-06: qty 6.7, 30d supply, fill #1
  Filled 2021-04-07: qty 6.7, 25d supply, fill #0
  Filled 2021-04-10: qty 8.5, 25d supply, fill #0
  Filled 2021-06-13: qty 8.5, 25d supply, fill #1

## 2020-10-20 NOTE — Patient Instructions (Signed)
Health Maintenance, Female Adopting a healthy lifestyle and getting preventive care are important in promoting health and wellness. Ask your health care provider about: The right schedule for you to have regular tests and exams. Things you can do on your own to prevent diseases and keep yourself healthy. What should I know about diet, weight, and exercise? Eat a healthy diet  Eat a diet that includes plenty of vegetables, fruits, low-fat dairy products, and lean protein. Do not eat a lot of foods that are high in solid fats, added sugars, or sodium.  Maintain a healthy weight Body mass index (BMI) is used to identify weight problems. It estimates body fat based on height and weight. Your health care provider can help determineyour BMI and help you achieve or maintain a healthy weight. Get regular exercise Get regular exercise. This is one of the most important things you can do for your health. Most adults should: Exercise for at least 150 minutes each week. The exercise should increase your heart rate and make you sweat (moderate-intensity exercise). Do strengthening exercises at least twice a week. This is in addition to the moderate-intensity exercise. Spend less time sitting. Even light physical activity can be beneficial. Watch cholesterol and blood lipids Have your blood tested for lipids and cholesterol at 53 years of age, then havethis test every 5 years. Have your cholesterol levels checked more often if: Your lipid or cholesterol levels are high. You are older than 53 years of age. You are at high risk for heart disease. What should I know about cancer screening? Depending on your health history and family history, you may need to have cancer screening at various ages. This may include screening for: Breast cancer. Cervical cancer. Colorectal cancer. Skin cancer. Lung cancer. What should I know about heart disease, diabetes, and high blood pressure? Blood pressure and heart  disease High blood pressure causes heart disease and increases the risk of stroke. This is more likely to develop in people who have high blood pressure readings, are of African descent, or are overweight. Have your blood pressure checked: Every 3-5 years if you are 18-39 years of age. Every year if you are 40 years old or older. Diabetes Have regular diabetes screenings. This checks your fasting blood sugar level. Have the screening done: Once every three years after age 40 if you are at a normal weight and have a low risk for diabetes. More often and at a younger age if you are overweight or have a high risk for diabetes. What should I know about preventing infection? Hepatitis B If you have a higher risk for hepatitis B, you should be screened for this virus. Talk with your health care provider to find out if you are at risk forhepatitis B infection. Hepatitis C Testing is recommended for: Everyone born from 1945 through 1965. Anyone with known risk factors for hepatitis C. Sexually transmitted infections (STIs) Get screened for STIs, including gonorrhea and chlamydia, if: You are sexually active and are younger than 53 years of age. You are older than 53 years of age and your health care provider tells you that you are at risk for this type of infection. Your sexual activity has changed since you were last screened, and you are at increased risk for chlamydia or gonorrhea. Ask your health care provider if you are at risk. Ask your health care provider about whether you are at high risk for HIV. Your health care provider may recommend a prescription medicine to help   prevent HIV infection. If you choose to take medicine to prevent HIV, you should first get tested for HIV. You should then be tested every 3 months for as long as you are taking the medicine. Pregnancy If you are about to stop having your period (premenopausal) and you may become pregnant, seek counseling before you get  pregnant. Take 400 to 800 micrograms (mcg) of folic acid every day if you become pregnant. Ask for birth control (contraception) if you want to prevent pregnancy. Osteoporosis and menopause Osteoporosis is a disease in which the bones lose minerals and strength with aging. This can result in bone fractures. If you are 65 years old or older, or if you are at risk for osteoporosis and fractures, ask your health care provider if you should: Be screened for bone loss. Take a calcium or vitamin D supplement to lower your risk of fractures. Be given hormone replacement therapy (HRT) to treat symptoms of menopause. Follow these instructions at home: Lifestyle Do not use any products that contain nicotine or tobacco, such as cigarettes, e-cigarettes, and chewing tobacco. If you need help quitting, ask your health care provider. Do not use street drugs. Do not share needles. Ask your health care provider for help if you need support or information about quitting drugs. Alcohol use Do not drink alcohol if: Your health care provider tells you not to drink. You are pregnant, may be pregnant, or are planning to become pregnant. If you drink alcohol: Limit how much you use to 0-1 drink a day. Limit intake if you are breastfeeding. Be aware of how much alcohol is in your drink. In the [place] one drink equals one 12 oz bottle of beer (355 mL), one 5 oz glass of wine (148 mL), or one 1 oz glass of hard liquor (44 mL). General instructions Schedule regular health, dental, and eye exams. Stay current with your vaccines. Tell your health care provider if: You often feel depressed. You have ever been abused or do not feel safe at home. Summary Adopting a healthy lifestyle and getting preventive care are important in promoting health and wellness. Follow your health care provider's instructions about healthy diet, exercising, and getting tested or screened for diseases. Follow your health care provider's  instructions on monitoring your cholesterol and blood pressure. This information is not intended to replace advice given to you by your health care provider. Make sure you discuss any questions you have with your healthcare provider. Document Revised: 03/12/2018 Document Reviewed: 03/12/2018 Elsevier Patient Education  2022 Elsevier Inc.  

## 2020-10-20 NOTE — Progress Notes (Signed)
[place], [place]  [id number] Phone:  [phone]   Fax:  [phone]   Established Patient Office Visit  Subjective:  Patient ID: Linda Cervantes, female    DOB: 12/09/1965  Age: 55 y.o. MRN: [id number]  CC:  Chief Complaint  Patient presents with   Follow-up    Refill all medications. Requesting referrals for Carpal Tunnel. Back pain.    HPI [name] presents for follow up. A former patient of NP Stroud. 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    Shoulder Pain Patient complains of bilateral shoulder pain. The symptoms began several years ago. Aggravating factors: no known event. Pain is located around the acromioclavicular [facility]) joint. Discomfort is described as aching. Symptoms are exacerbated by lying on the shoulder. Evaluation to date: none. Therapy to date includes: prescription NSAIDS which are not very effective.  She has depression on Zyprexa. She was previously followed by [facility]. She is not currently counseling with anyone.  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   Substance abuse ([place]) Clean as of 2009   Crack cocaine    Past Surgical History:  Procedure Laterality Date   CESAREAN SECTION     FOOT SURGERY     INCISION AND DRAINAGE OF WOUND Right 05/22/2018   Procedure: IRRIGATION AND DEBRIDEMENT RIGHT ARM WITH REMOVAL FOREIGN BODY;  Surgeon: [name], MD;  Location: [place];  Service: Orthopedics;  Laterality: Right;   TUBAL LIGATION  2000    Family History  Problem Relation Age of Onset   Hypertension Father     Diabetes Maternal Grandmother     Social History   Socioeconomic History   Marital status: Legally Separated    Spouse name: Not on file   Number of children: Not on file   Years of education: Not on file   Highest education level: Not on file  Occupational History   Not on file  Tobacco Use   Smoking status: Every Day    Packs/day: 1.00    Types: Cigarettes   Smokeless tobacco: Former  Substance and Sexual Activity   Alcohol use: No   Drug use: Not Currently    Types: Cocaine, Heroin, IV    Comment: Heroin    Sexual activity: Yes    Birth control/protection: Surgical    Comment: TUBAL LIGATION  Other Topics Concern   Not on file  Social History Narrative   Not on file   Social Determinants of Health   Financial Resource Strain: Not on file  Food Insecurity: Not on file  Transportation Needs: Not on file  Physical Activity: Not on file  Stress: Not on file  Social Connections: Not on file  Intimate Partner Violence: Not on file    Outpatient Medications Prior to Visit  Medication Sig Dispense Refill   meloxicam (MOBIC) 7.5 MG tablet Take 2 tablets (15 mg total) by mouth daily. 30 tablet 3   senna (SENOKOT) 8.6 MG TABS tablet Take 1 tablet (8.6 mg total) by mouth at bedtime. 30 tablet 3   albuterol (VENTOLIN  HFA) 108 (90 Base) MCG/ACT inhaler Inhale 2 puffs into the lungs every 6 (six) hours as needed for wheezing (wheezing). 6.7 g 3   cetirizine (ZYRTEC) 10 MG tablet Take 1 tablet (10 mg total) by mouth daily. 30 tablet 11   gabapentin (NEURONTIN) 300 MG capsule Take 1 capsule (300 mg total) by mouth 3 (three) times daily. 90 capsule 3   ibuprofen (ADVIL,MOTRIN) 800 MG tablet Take 1 tablet (800 mg total) by mouth every 8 (eight) hours as needed. 30 tablet 3   meclizine (ANTIVERT) 25 MG tablet Take 1 tablet (25 mg total) by mouth 3 (three) times daily as needed for dizziness. 30 tablet 3   mometasone (NASONEX) 50 MCG/ACT nasal spray Place 2 sprays into the nose  daily. 17 g 0   OLANZapine (ZYPREXA) 10 MG tablet Take 1 tablet (10 mg total) by mouth at bedtime. 30 tablet 3   hydrocortisone cream 0.5 % Apply 1 application topically 2 (two) times daily. 30 g 2   mupirocin ointment (BACTROBAN) 2 % Apply 1 application topically 3 (three) times daily. 22 g 0   No facility-administered medications prior to visit.    Allergies  Allergen Reactions   Amitriptyline Hcl Other (See Comments)    Face Swelling    ROS Review of Systems  Musculoskeletal:  Positive for back pain (midline lower back stabbing pain) and joint swelling (BL shoulder pain aching in the shoulder blades for awhile. Pain increasing).       CTS and right hand thumb pain     Objective:    Physical Exam Constitutional:      General: She is not in acute distress. Cardiovascular:     Rate and Rhythm: Normal rate and regular rhythm.     Pulses: Normal pulses.     Heart sounds: Normal heart sounds.  Pulmonary:     Effort: Pulmonary effort is normal.     Comments: Diminished  Abdominal:     Palpations: Abdomen is soft.     Comments: Hypoactive   Musculoskeletal:        General: Swelling (hands) and tenderness present.     Cervical back: Normal range of motion.  Skin:    General: Skin is warm and dry.  Neurological:     General: No focal deficit present.     Mental Status: She is alert and oriented to person, place, and time.   BP 128/90   Pulse 80   Temp (!) 97.2 F (36.2 C)   Ht 5\' 3"  (1.6 m)   Wt 122 lb 0.8 oz (55.4 kg)   LMP  (LMP Unknown) Comment: approx 2 years   SpO2 97%   BMI 21.62 kg/m  Wt Readings from Last 3 Encounters:  10/20/20 122 lb 0.8 oz (55.4 kg)  08/24/20 125 lb (56.7 kg)  07/20/20 120 lb (54.4 kg)     Health Maintenance Due  Topic Date Due   MAMMOGRAM  Never done   PAP SMEAR-Modifier  04/04/2020    There are no preventive care reminders to display for this patient.  Lab Results  Component Value Date   TSH 2.180 02/04/2018   Lab  Results  Component Value Date   WBC 6.1 02/04/2018   HGB 13.3 02/04/2018   HCT 40.7 02/04/2018   MCV 82 02/04/2018   PLT 223 02/04/2018   Lab Results  Component Value Date   NA 139 02/04/2018   K 3.8 02/04/2018   CO2 21 02/04/2018   GLUCOSE 73 02/04/2018  BUN 7 02/04/2018   CREATININE 0.91 02/04/2018   BILITOT 0.3 02/04/2018   ALKPHOS 68 02/04/2018   AST 29 02/04/2018   ALT 26 02/04/2018   PROT 7.1 02/04/2018   ALBUMIN 4.2 02/04/2018   CALCIUM 9.3 02/04/2018   ANIONGAP 5 01/27/2018   Lab Results  Component Value Date   CHOL 197 02/04/2018   Lab Results  Component Value Date   HDL 49 02/04/2018   Lab Results  Component Value Date   LDLCALC 132 (H) 02/04/2018   Lab Results  Component Value Date   TRIG 81 02/04/2018   Lab Results  Component Value Date   CHOLHDL 4.0 02/04/2018   Lab Results  Component Value Date   HGBA1C 5.3 02/04/2018      Assessment & Plan:   Problem List Items Addressed This Visit       Other   Dizziness Stable with meclizine   Relevant Medications   meclizine (ANTIVERT) 25 MG tablet   Other Relevant Orders   Comp. Metabolic Panel (12)   Other Visit Diagnoses     Chronic pain of both shoulders    -  Primary Worsening Images pending Refill on anti-inflammatories and gabapentin   Relevant Medications   ibuprofen (ADVIL) 800 MG tablet   gabapentin (NEURONTIN) 300 MG capsule   Other Relevant Orders   Ambulatory referral to Psychiatry   DG Shoulder Right   DG Shoulder Left   Chronic bilateral low back pain with bilateral sciatica     Worsening Images pending   Relevant Medications   ibuprofen (ADVIL) 800 MG tablet   OLANZapine (ZYPREXA) 10 MG tablet   gabapentin (NEURONTIN) 300 MG capsule   Other Relevant Orders   Ambulatory referral to Psychiatry   DG Lumbar Spine Complete   Neck pain on left side       Relevant Medications   ibuprofen (ADVIL) 800 MG tablet   OLANZapine (ZYPREXA) 10 MG tablet   Right hand pain      Increased swelling without injury Further evaluation with images   Relevant Medications   gabapentin (NEURONTIN) 300 MG capsule   Screening for colon cancer       Relevant Orders   Ambulatory referral to Gastroenterology   Screening for cholesterol level       Relevant Orders   Lipid panel   Screening for thyroid disorder       Relevant Orders   TSH   Screening for iron deficiency anemia       Relevant Orders   VITAMIN D 25 Hydroxy (Vit-D Deficiency, Fractures)   Screening for rheumatoid arthritis       Relevant Orders   DG Hand Complete Right   Arthritis Panel   Healthcare maintenance       Relevant Orders   Urinalysis Dipstick (Completed)       Meds ordered this encounter  Medications   cetirizine (ZYRTEC) 10 MG tablet    Sig: Take 1 tablet (10 mg total) by mouth daily.    Dispense:  30 tablet    Refill:  11    Order Specific Question:   Supervising Provider    Answer:   Tresa Garter [[id number]]   albuterol (VENTOLIN HFA) 108 (90 Base) MCG/ACT inhaler    Sig: Inhale 2 puffs into the lungs every 6 (six) hours as needed for wheezing (wheezing).    Dispense:  6.7 g    Refill:  3    Order Specific Question:   Supervising Provider  AnswerTresa Garter [[id number]]   meclizine (ANTIVERT) 25 MG tablet    Sig: Take 1 tablet (25 mg total) by mouth 3 (three) times daily as needed for dizziness.    Dispense:  30 tablet    Refill:  3    Order Specific Question:   Supervising Provider    Answer:   Tresa Garter [[id number]]   ibuprofen (ADVIL) 800 MG tablet    Sig: Take 1 tablet (800 mg total) by mouth every 8 (eight) hours as needed.    Dispense:  30 tablet    Refill:  3    Order Specific Question:   Supervising Provider    Answer:   Tresa Garter [[id number]]   mometasone (NASONEX) 50 MCG/ACT nasal spray    Sig: Place 2 sprays into the nose daily.    Dispense:  17 g    Refill:  0    Order Specific Question:   Supervising Provider    Answer:    Tresa Garter [[id number]]   OLANZapine (ZYPREXA) 10 MG tablet    Sig: Take 1 tablet (10 mg total) by mouth at bedtime.    Dispense:  30 tablet    Refill:  3    Order Specific Question:   Supervising Provider    Answer:   Tresa Garter [[id number]]   polyethylene glycol powder (GLYCOLAX/MIRALAX) 17 GM/SCOOP powder    Sig: Take 17 g by mouth daily. Follow instructions on the bottle. Use no more than 7 days as needed for occasional constipation.    Dispense:  510 g    Refill:  0    This is an OTC product. This prescription is to serve as a reminder to the patient as to our preference.    Order Specific Question:   Supervising Provider    Answer:   Tresa Garter [[id number]]   gabapentin (NEURONTIN) 300 MG capsule    Sig: Take 1 capsule (300 mg total) by mouth 3 (three) times daily.    Dispense:  90 capsule    Refill:  3    Order Specific Question:   Supervising Provider    Answer:   Tresa Garter [[id number]]    Follow-up: Return in about 6 months (around 04/22/2021).    Vevelyn Francois, NP

## 2020-10-21 ENCOUNTER — Other Ambulatory Visit: Payer: Self-pay

## 2020-10-21 LAB — COMP. METABOLIC PANEL (12)
AST: 14 IU/L (ref 0–40)
Albumin/Globulin Ratio: 1.2 (ref 1.2–2.2)
Albumin: 3.8 g/dL (ref 3.8–4.9)
Alkaline Phosphatase: 94 IU/L (ref 44–121)
BUN/Creatinine Ratio: 7 — ABNORMAL LOW (ref 9–23)
BUN: 5 mg/dL — ABNORMAL LOW (ref 6–24)
Bilirubin Total: 0.2 mg/dL (ref 0.0–1.2)
Calcium: 9 mg/dL (ref 8.7–10.2)
Chloride: 103 mmol/L (ref 96–106)
Creatinine, Ser: 0.76 mg/dL (ref 0.57–1.00)
Globulin, Total: 3.2 g/dL (ref 1.5–4.5)
Glucose: 94 mg/dL (ref 65–99)
Potassium: 4 mmol/L (ref 3.5–5.2)
Sodium: 138 mmol/L (ref 134–144)
Total Protein: 7 g/dL (ref 6.0–8.5)
eGFR: 94 mL/min/{1.73_m2} (ref >59)

## 2020-10-21 LAB — TSH: TSH: 0.75 u[IU]/mL (ref 0.450–4.500)

## 2020-10-21 LAB — VITAMIN D 25 HYDROXY (VIT D DEFICIENCY, FRACTURES): Vit D, 25-Hydroxy: 25.5 ng/mL — ABNORMAL LOW (ref 30.0–100.0)

## 2020-10-21 LAB — LIPID PANEL
Chol/HDL Ratio: 4.4 ratio (ref 0.0–4.4)
Cholesterol, Total: 184 mg/dL (ref 100–199)
HDL: 42 mg/dL (ref >39)
LDL Chol Calc (NIH): 123 mg/dL — ABNORMAL HIGH (ref 0–99)
Triglycerides: 103 mg/dL (ref 0–149)
VLDL Cholesterol Cal: 19 mg/dL (ref 5–40)

## 2020-11-10 ENCOUNTER — Encounter (HOSPITAL_BASED_OUTPATIENT_CLINIC_OR_DEPARTMENT_OTHER): Payer: Self-pay

## 2020-11-10 ENCOUNTER — Other Ambulatory Visit: Payer: Self-pay

## 2020-11-10 DIAGNOSIS — X58XXXA Exposure to other specified factors, initial encounter: Secondary | ICD-10-CM | POA: Insufficient documentation

## 2020-11-10 DIAGNOSIS — F17210 Nicotine dependence, cigarettes, uncomplicated: Secondary | ICD-10-CM | POA: Insufficient documentation

## 2020-11-10 DIAGNOSIS — J45909 Unspecified asthma, uncomplicated: Secondary | ICD-10-CM | POA: Insufficient documentation

## 2020-11-10 DIAGNOSIS — S40851A Superficial foreign body of right upper arm, initial encounter: Secondary | ICD-10-CM | POA: Insufficient documentation

## 2020-11-10 DIAGNOSIS — Z7951 Long term (current) use of inhaled steroids: Secondary | ICD-10-CM | POA: Insufficient documentation

## 2020-11-10 DIAGNOSIS — F1910 Other psychoactive substance abuse, uncomplicated: Secondary | ICD-10-CM | POA: Insufficient documentation

## 2020-11-10 DIAGNOSIS — L02414 Cutaneous abscess of left upper limb: Secondary | ICD-10-CM | POA: Insufficient documentation

## 2020-11-10 NOTE — ED Triage Notes (Addendum)
Abscess on left forearm from injecting cocaine. Abscess has been swelling for approx 3 days. Admits breaking a needle in same area 1 year ago.   Also sts cutting right hand with scissors. R hand swelling x 1 week.

## 2020-11-11 ENCOUNTER — Emergency Department (HOSPITAL_BASED_OUTPATIENT_CLINIC_OR_DEPARTMENT_OTHER)
Admission: EM | Admit: 2020-11-11 | Discharge: 2020-11-11 | Disposition: A | Discharge status: Home / Self Care | Payer: Self-pay | Attending: Emergency Medicine | Admitting: Emergency Medicine

## 2020-11-11 ENCOUNTER — Emergency Department (HOSPITAL_BASED_OUTPATIENT_CLINIC_OR_DEPARTMENT_OTHER): Payer: Self-pay

## 2020-11-11 ENCOUNTER — Other Ambulatory Visit: Payer: Self-pay

## 2020-11-11 DIAGNOSIS — L02414 Cutaneous abscess of left upper limb: Secondary | ICD-10-CM

## 2020-11-11 DIAGNOSIS — S40851A Superficial foreign body of right upper arm, initial encounter: Secondary | ICD-10-CM

## 2020-11-11 DIAGNOSIS — F1990 Other psychoactive substance use, unspecified, uncomplicated: Secondary | ICD-10-CM

## 2020-11-11 IMAGING — DX DG FOREARM 2V*R*
1 series · 2 of 2 positions shown · non-contrast
Comparison: Right forearm series [DATE].

CLINICAL DATA: 54-year-old female with possible forearm abscess
status post injecting cocaine. Soft tissue swelling.

EXAM:
RIGHT FOREARM - 2 VIEW

[Series 1: forearmbone · 0.14mm/px · 2 of 2 slices shown]
[im 1/2]
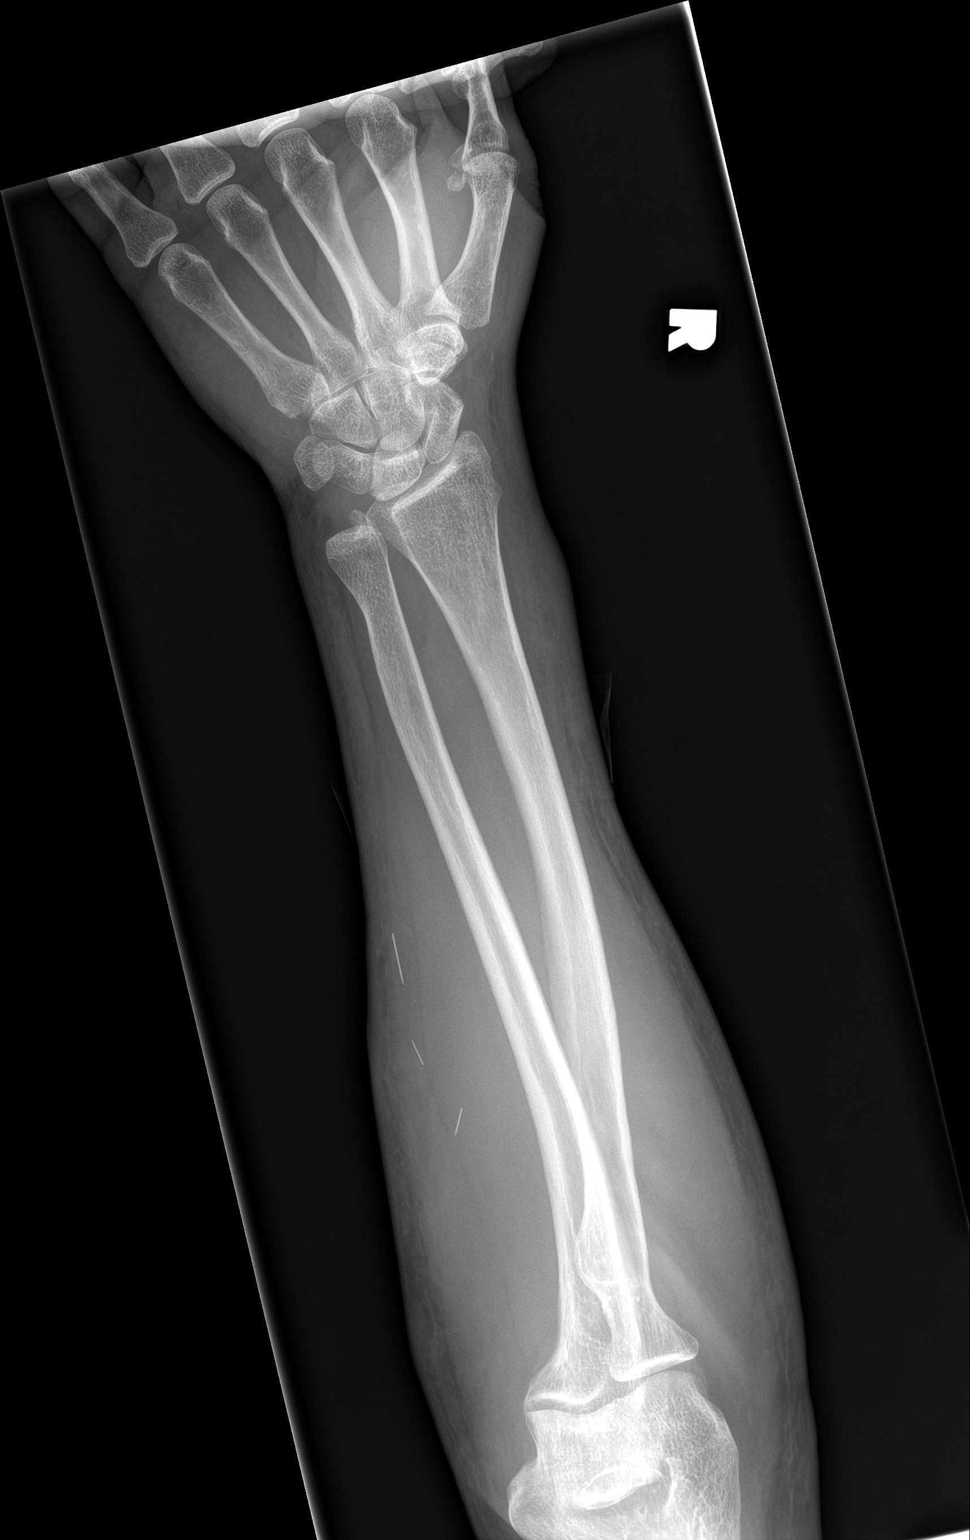
[im 2/2]
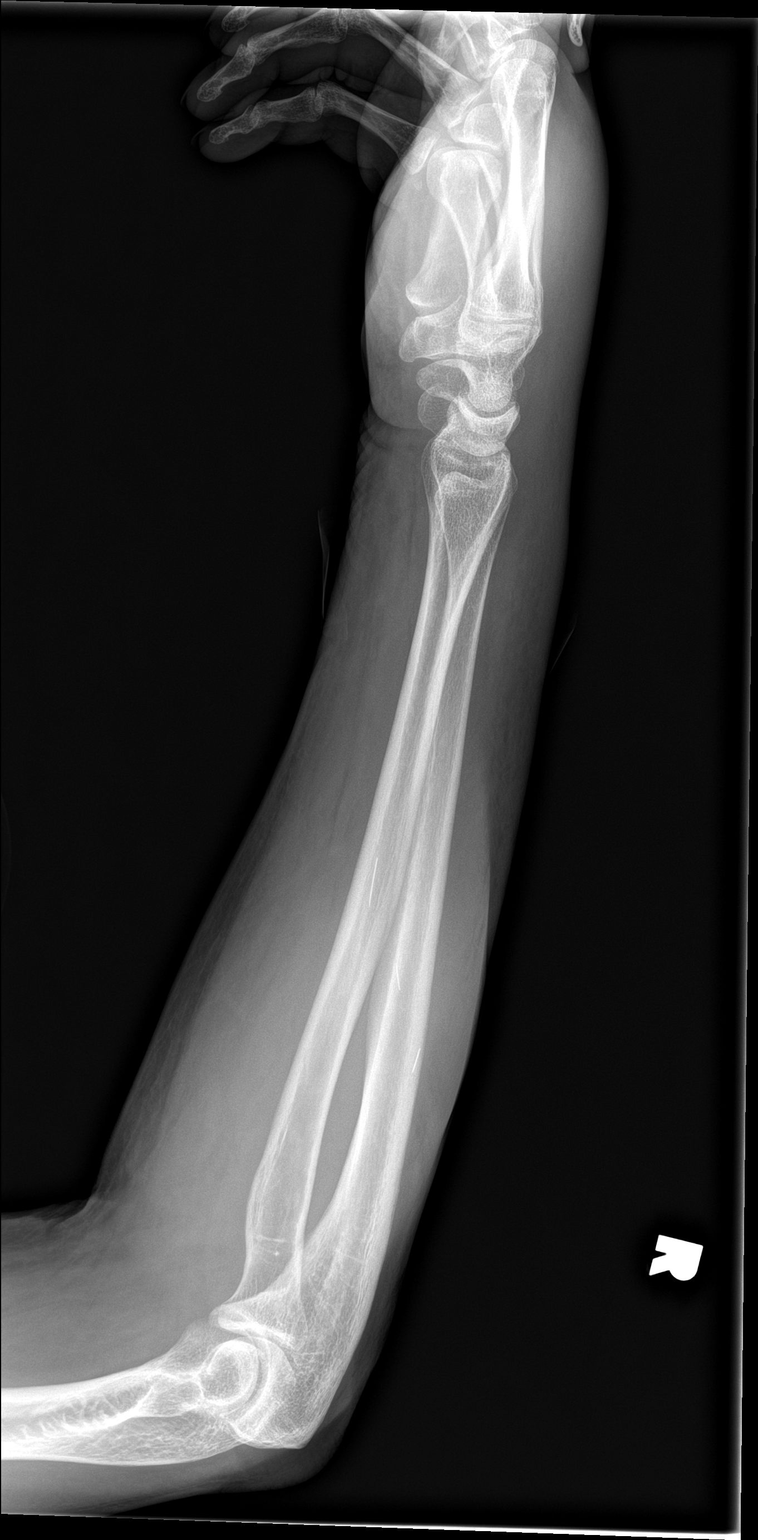

[2 of 2 positions shown; findings below may reference images not displayed]

FINDINGS: Three retained linear metallic needle fragments are now visible in
the medial right forearm (image #1), and these appear to be
different from the solitary retained fragment last year. Generalized
soft tissue stranding. Increased distal forearm soft tissue swelling
compared to last year.

Bone mineralization is within normal limits. Alignment maintained at
the wrist and elbow. No acute osseous abnormality identified.
IMPRESSION: 1. Three new retained metal needle fragments in the medial right
forearm since last year. Increased soft tissue swelling.
2.  No osseous abnormality identified.

## 2020-11-11 IMAGING — DX DG FOREARM 2V*L*
1 series · 2 of 2 positions shown · non-contrast
Comparison: Left shoulder series [DATE].

CLINICAL DATA: 54-year-old female with possible forearm abscess
status post injecting cocaine. Soft tissue swelling.

EXAM:
LEFT FOREARM - 2 VIEW

[Series 1: forearmbone · 0.14mm/px · 2 of 2 slices shown]
[im 1/2]
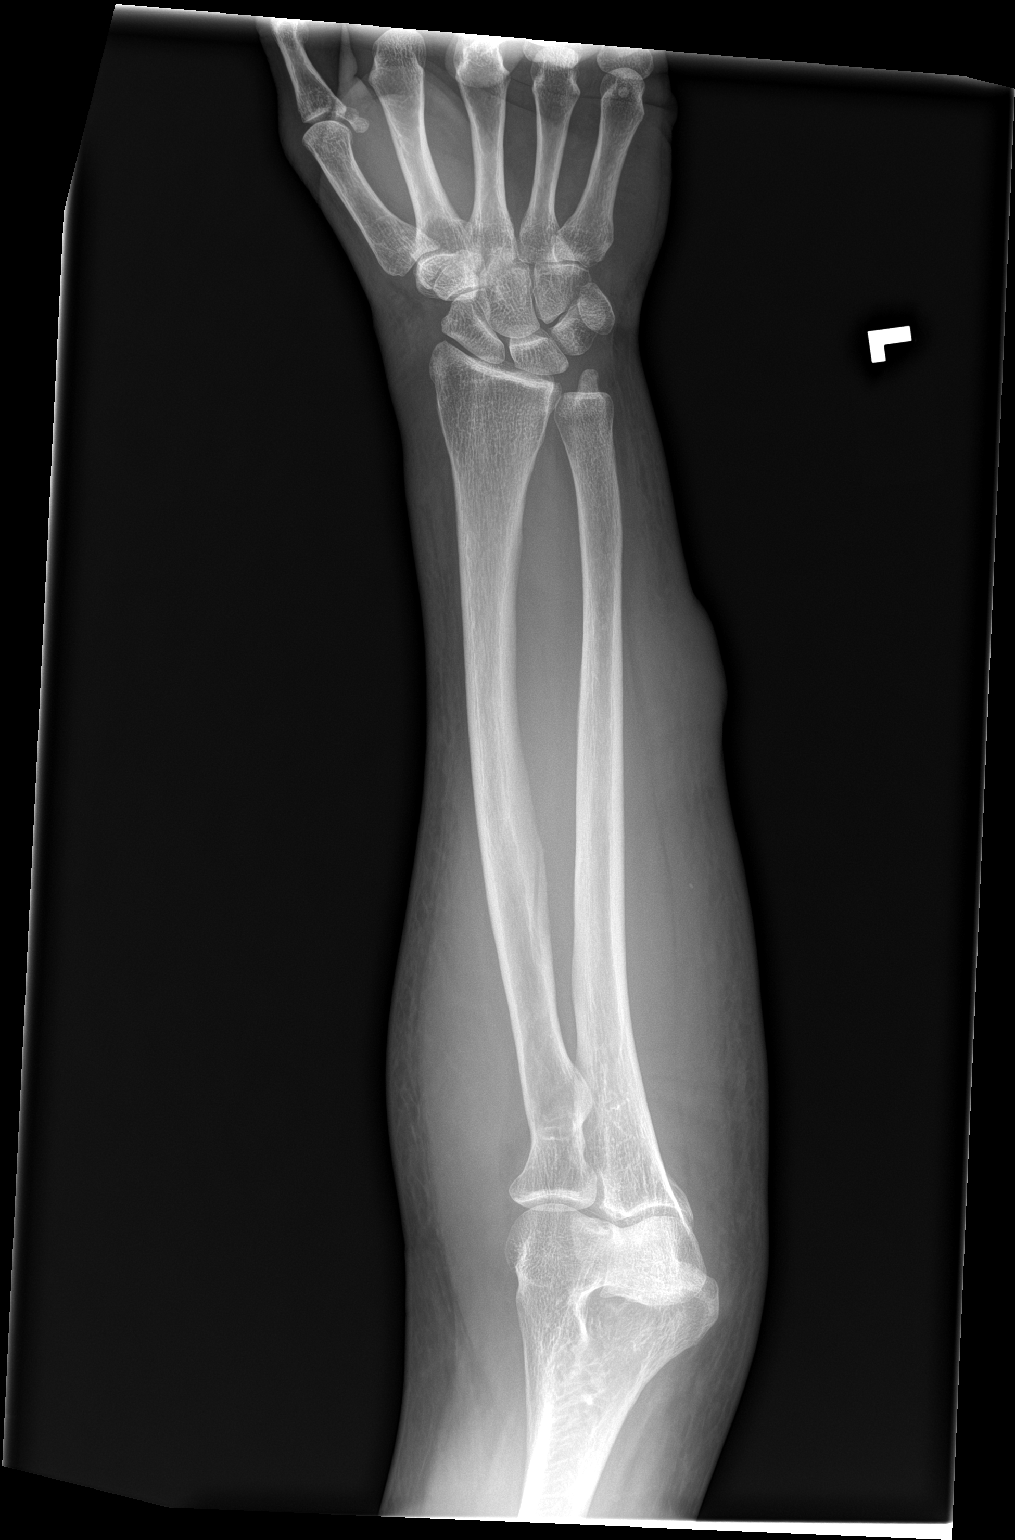
[im 2/2]
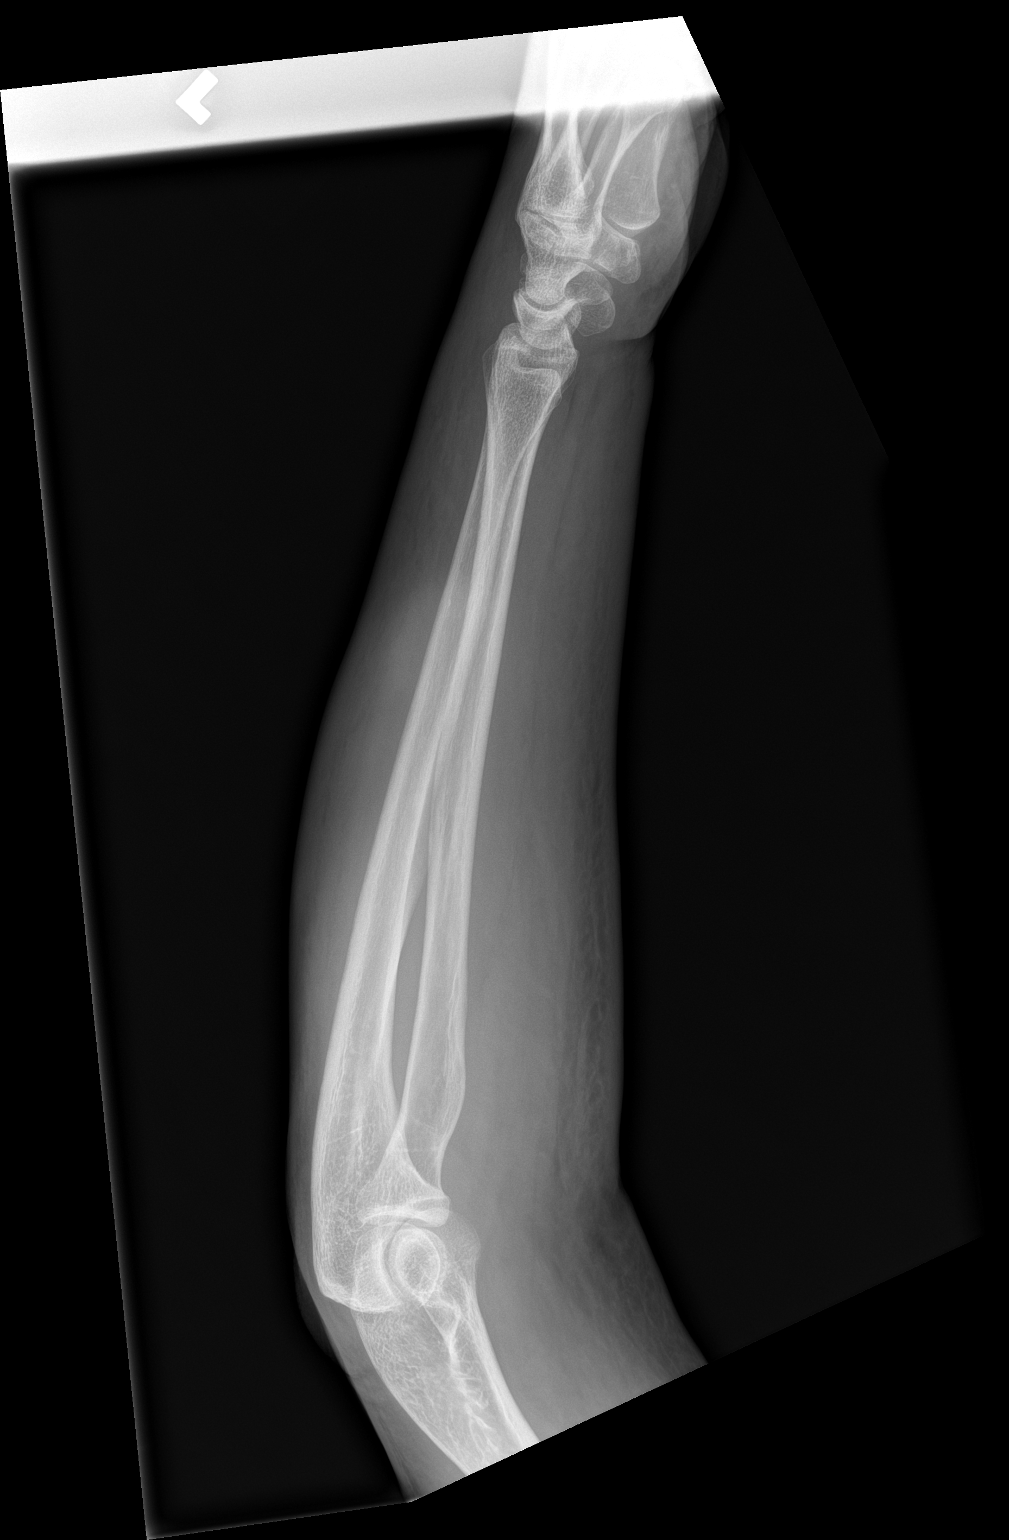

[2 of 2 positions shown; findings below may reference images not displayed]

FINDINGS: Discrete roughly 4 cm area of soft tissue swelling in the medial
forearm overlying the distal 3rd ulna shaft. Other generalized soft
tissue stranding in the forearm. No soft tissue gas. No radiopaque
foreign body identified.

Bone mineralization is within normal limits. No osseous abnormality
identified. Maintained alignment at the elbow and wrist.
IMPRESSION: 4 cm area of discrete soft tissue swelling at the medial forearm. No
soft tissue gas, radiopaque foreign body, or osseous abnormality
identified.

## 2020-11-11 MED ORDER — AMOXICILLIN 500 MG PO CAPS
1000.0000 mg | ORAL_CAPSULE | Freq: Once | ORAL | Status: AC
  Administered 2020-11-11: 1000 mg via ORAL
  Filled 2020-11-11: qty 2

## 2020-11-11 MED ORDER — DOXYCYCLINE HYCLATE 100 MG PO TABS
100.0000 mg | ORAL_TABLET | Freq: Once | ORAL | Status: AC
  Administered 2020-11-11: 100 mg via ORAL
  Filled 2020-11-11: qty 1

## 2020-11-11 MED ORDER — DOXYCYCLINE HYCLATE 100 MG PO CAPS
100.0000 mg | ORAL_CAPSULE | Freq: Two times a day (BID) | ORAL | 0 refills | Status: DC
  Filled 2020-11-11: qty 14, 7d supply, fill #0

## 2020-11-11 MED ORDER — LIDOCAINE-EPINEPHRINE (PF) 2 %-1:200000 IJ SOLN
10.0000 mL | Freq: Once | INTRAMUSCULAR | Status: AC
  Administered 2020-11-11: 10 mL
  Filled 2020-11-11: qty 20
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
## 2020-11-11 NOTE — Discharge Instructions (Addendum)
Substance Abuse Treatment Programs ° °Intensive Outpatient Programs °[facility]     °[address]      °[place], [place]                   °[phone]      ° °[facility] °[address] °[place], [place] °[phone] ° °[facility]     °(Inpatient and outpatient)     °[address]           °[phone]   ° °[facility] °[phone] (Suboxone and Methadone) ° °[address]      °[place], [place] [address]      °[phone]      ° °[address] °[place], [place] °852-3033 ° °[facility] (Outpatient/Inpatient, [facility])    °(insurance only) [phone]      °       °[facility] (Groups & Residential) °[place], [place] °[phone] ° °   °[facility]     °[address]     °[place], [place]      °[phone]      ° °[facility] (for caregivers and family) °[address] °[place], [place] °336-299-4655 ° ° ° ° ° °Residential Treatment Programs °[facility]      °[address], [place], [place] [address]  °[phone]      ° °[facility] °[address] [place], [place] [address] °[phone] ° °[facility]        °[phone]      ° °[facility] °[phone] ° °[facility]             °[address]                                         °[place], [place]                                                °[phone] or [phone]                              ° °[facility] [place] °[address] °[place], [address] °[phone] ° °[facility]    °[address]      °[place], [place]     °[phone]      ° °[facility] °[address] °[place], [place] °[phone] ° °[facility]   °[address]     °[place], [place] [address]     °[phone]      °Admissions: 8am-3pm M-F ° °[facility] °[address] °[place],  [place] °[phone] ° °[facility]: Residential Program (90 Days)   °[place], [place]      °336-725-8389 or [phone]    ° °[facility] °[place], [place] °(Walk in Hours over the weekend or by referral) ° °[facility] °[address], [place],  [address] °([phone] ° °Crisis Mobile: [facility]:  1-877-626-1772 (for crisis response 24 hours a day) °[facility] Hotline:      1-800-256-2452 °Outpatient Psychiatry and Counseling ° °[facility]: Mobile Crisis   Management 24 hours:  1-877-626-1772 ° °[facility] sliding scale fee and walk in schedule: M-F 8am-12pm/1pm-3pm °[address]  °[place], [place] [address] °[phone] ° °[facility] °[address] °[place], [place] [address] °[phone] ° °[facility] ([facility])- new patient walk-in appointments available Monday - Friday 8am -3pm.          °[address] °[place], [place] [address] °336-676-6840 or crisis line- 336-676-6905 ° °[facility] Intensive Outpatient Therapy Program °[address] °[place], [place] [address] °336-832-9804 ° °[facility]                  °Crisis Services      °[phone]      °[address]     °[place], [place] [address]                ° °[facility]   °[facility] °[phone] °[address] °[place], [place] [address] ° ° °[facility]          °[address]  °[place], [place] [address]       °[phone] ° °[facility] °[address] °[place], [place] [address] °336-292-1510 ° °[facility]    °[address]    °[place], [place] [address]     °[phone]      ° °Parish McKinney, MD     °[address]     °[place] [address]     °[phone]     °  °[facility] °[address] °[place] [address] ° °[facility]     °[address]     °[place], [place]      °[phone]      ° °[facility] °Shamsher Ahluwalia, MD °[address] °[place], [place] [address] °[phone] ° °[facility]     °[address]     °[place], [place] [address]     °336-274-1237      ° °[facility] °[address] °[place], [place] [address] °336-854-4450 °Resources for Temporary Residential Assistance/Crisis Centers ° °DAY CENTERS °[facility] °M-F 8am-3pm   °[address] [place], [place] [address]   336-332-0824 °Services include: laundry, barbering, support groups, case management, phone  & computer access, showers, AA/NA mtgs, mental health/substance abuse nurse, job skills class, disability information, VA assistance, spiritual classes, etc.  ° °HOMELESS SHELTERS ° °[facility]     °[facility]   °[address], [place]     °[phone]       °       °[facility] (women and children)       °[address] °[place], [place] [address] °336-275-0820 °[email or web address] for application and process °Application Required ° °[facility]   °[address]    °[place]  [address]     °[phone]       °             °[facility] °[address] °[place],  [address] °[phone] °336-235-0363(schedule application appt.) °Application Required ° °[facility] (women only)    °[address]     °[place],  [address]     °336-884-1039      °  Intake starts 6pm daily °Need valid ID, SSC, & Police report °[facility] [place] °[address] °[place], [place] °336-881-5420 °Application Required ° °[facility] (men only)     °[address]      °[place], [place]     °[phone]      ° °[facility] °(Pregnant women only) °[address] °, [place] °336-275-0206 ° °[facility]      °[address]      °[place], [place] [address]     °[phone]      °       °[facility] °[address] °[place], [place] °336-723-1848 °90 day commitment/SA/Application process ° °[facility](men only)     °[address]     °[place], [place]     °[phone]       °Check-in at 7pm     °       °[facility] °[address] °[place], [place] [address] °336-248-6684 °Men/Women/Women and Children must be there by 7 pm ° °[facility] °[place],  °336-722-8721                ° °

## 2020-12-04 ENCOUNTER — Encounter (HOSPITAL_BASED_OUTPATIENT_CLINIC_OR_DEPARTMENT_OTHER): Payer: Self-pay | Admitting: Obstetrics and Gynecology

## 2020-12-04 ENCOUNTER — Emergency Department (HOSPITAL_BASED_OUTPATIENT_CLINIC_OR_DEPARTMENT_OTHER): Payer: Medicaid Other | Admitting: Radiology

## 2020-12-04 ENCOUNTER — Other Ambulatory Visit: Payer: Self-pay

## 2020-12-04 ENCOUNTER — Emergency Department (HOSPITAL_BASED_OUTPATIENT_CLINIC_OR_DEPARTMENT_OTHER)
Admission: EM | Admit: 2020-12-04 | Discharge: 2020-12-04 | Disposition: A | Discharge status: Home / Self Care | Payer: Medicaid Other | Attending: Emergency Medicine | Admitting: Emergency Medicine

## 2020-12-04 DIAGNOSIS — J45909 Unspecified asthma, uncomplicated: Secondary | ICD-10-CM | POA: Insufficient documentation

## 2020-12-04 DIAGNOSIS — M545 Low back pain, unspecified: Secondary | ICD-10-CM | POA: Insufficient documentation

## 2020-12-04 DIAGNOSIS — M25551 Pain in right hip: Secondary | ICD-10-CM | POA: Insufficient documentation

## 2020-12-04 DIAGNOSIS — M25552 Pain in left hip: Secondary | ICD-10-CM | POA: Insufficient documentation

## 2020-12-04 DIAGNOSIS — F17210 Nicotine dependence, cigarettes, uncomplicated: Secondary | ICD-10-CM | POA: Insufficient documentation

## 2020-12-04 HISTORY — DX: Paranoid schizophrenia: F20.0

## 2020-12-04 LAB — CBC WITH DIFFERENTIAL/PLATELET
Abs Immature Granulocytes: 0.02 10*3/uL (ref 0.00–0.07)
Basophils Absolute: 0 10*3/uL (ref 0.0–0.1)
Basophils Relative: 0 %
Eosinophils Absolute: 0.2 10*3/uL (ref 0.0–0.5)
Eosinophils Relative: 3 %
HCT: 35.8 % — ABNORMAL LOW (ref 36.0–46.0)
Hemoglobin: 11.3 g/dL — ABNORMAL LOW (ref 12.0–15.0)
Immature Granulocytes: 0 %
Lymphocytes Relative: 29 %
Lymphs Abs: 1.8 10*3/uL (ref 0.7–4.0)
MCH: 26.1 pg (ref 26.0–34.0)
MCHC: 31.6 g/dL (ref 30.0–36.0)
MCV: 82.7 fL (ref 80.0–100.0)
Monocytes Absolute: 0.6 10*3/uL (ref 0.1–1.0)
Monocytes Relative: 9 %
Neutro Abs: 3.8 10*3/uL (ref 1.7–7.7)
Neutrophils Relative %: 59 %
Platelets: 189 10*3/uL (ref 150–400)
RBC: 4.33 MIL/uL (ref 3.87–5.11)
RDW: 13.8 % (ref 11.5–15.5)
WBC: 6.4 10*3/uL (ref 4.0–10.5)
nRBC: 0 % (ref 0.0–0.2)

## 2020-12-04 LAB — COMPREHENSIVE METABOLIC PANEL
ALT: 12 U/L (ref 0–44)
AST: 19 U/L (ref 15–41)
Albumin: 3.6 g/dL (ref 3.5–5.0)
Alkaline Phosphatase: 77 U/L (ref 38–126)
Anion gap: 7 (ref 5–15)
BUN: 10 mg/dL (ref 6–20)
CO2: 28 mmol/L (ref 22–32)
Calcium: 9.4 mg/dL (ref 8.9–10.3)
Chloride: 102 mmol/L (ref 98–111)
Creatinine, Ser: 0.69 mg/dL (ref 0.44–1.00)
GFR, Estimated: >60 mL/min (ref >60)
Glucose, Bld: 116 mg/dL — ABNORMAL HIGH (ref 70–99)
Potassium: 3.9 mmol/L (ref 3.5–5.1)
Sodium: 137 mmol/L (ref 135–145)
Total Bilirubin: 0.4 mg/dL (ref 0.3–1.2)
Total Protein: 7.4 g/dL (ref 6.5–8.1)

## 2020-12-04 IMAGING — DX DG PELVIS 1-2V
1 series · 1 of 1 positions shown · non-contrast
Comparison: None.

CLINICAL DATA: Back pain, pelvic pain

EXAM:
PELVIS - 1-2 VIEW

[pelvis ap]
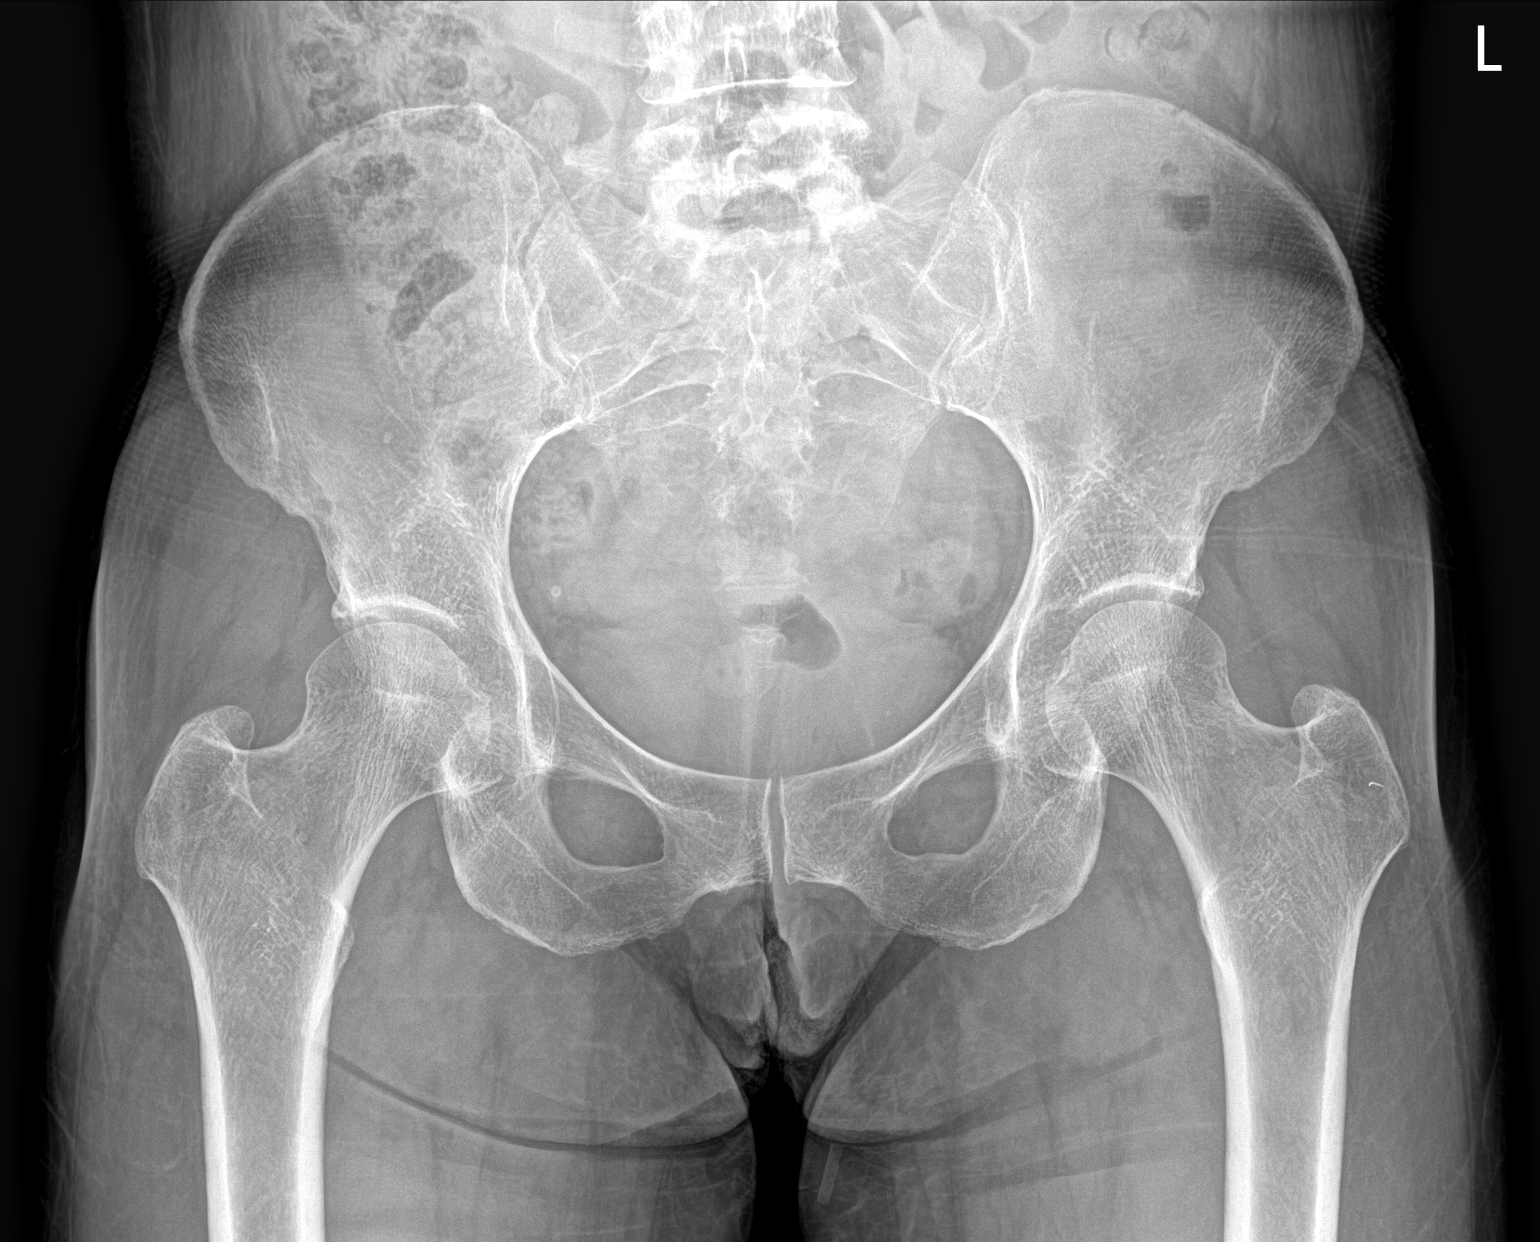

[1 of 1 positions shown; findings below may reference images not displayed]

FINDINGS: There is no evidence of pelvic fracture or diastasis. No pelvic bone
lesions are seen.
IMPRESSION: Negative.

## 2020-12-04 IMAGING — DX DG LUMBAR SPINE COMPLETE 4+V
5 series · 5 of 5 positions shown · non-contrast
Comparison: [DATE]

CLINICAL DATA: Back pain

EXAM:
LUMBAR SPINE - COMPLETE 4+ VIEW

[l-spine obl (1 of 2)]
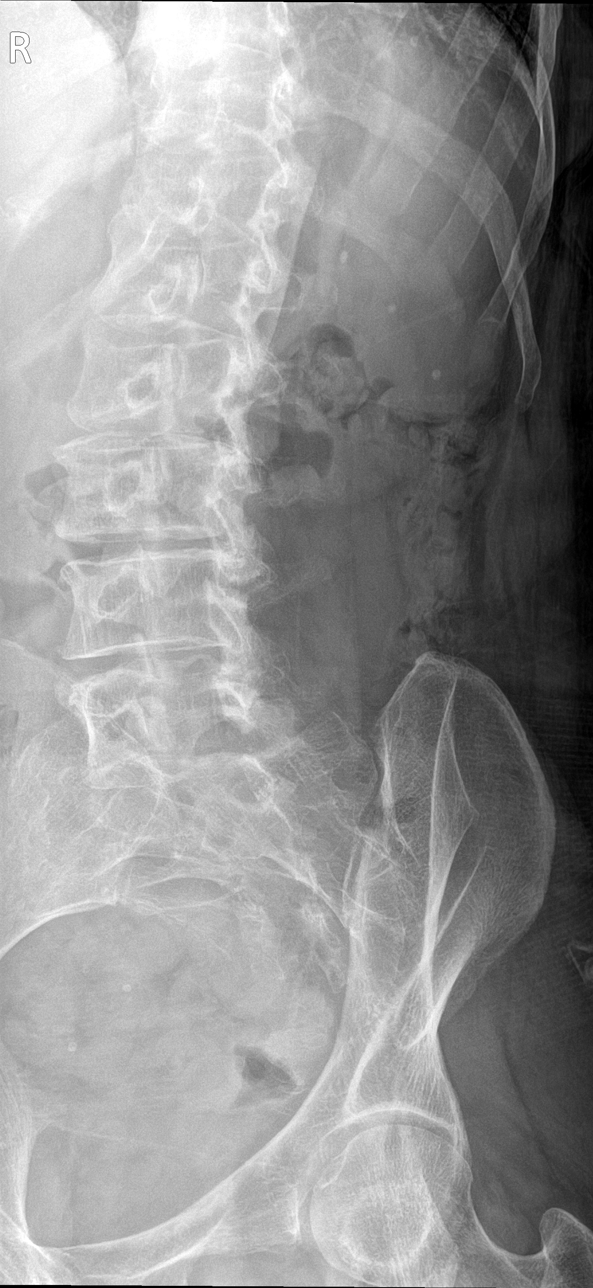

[l-spine obl (2 of 2)]
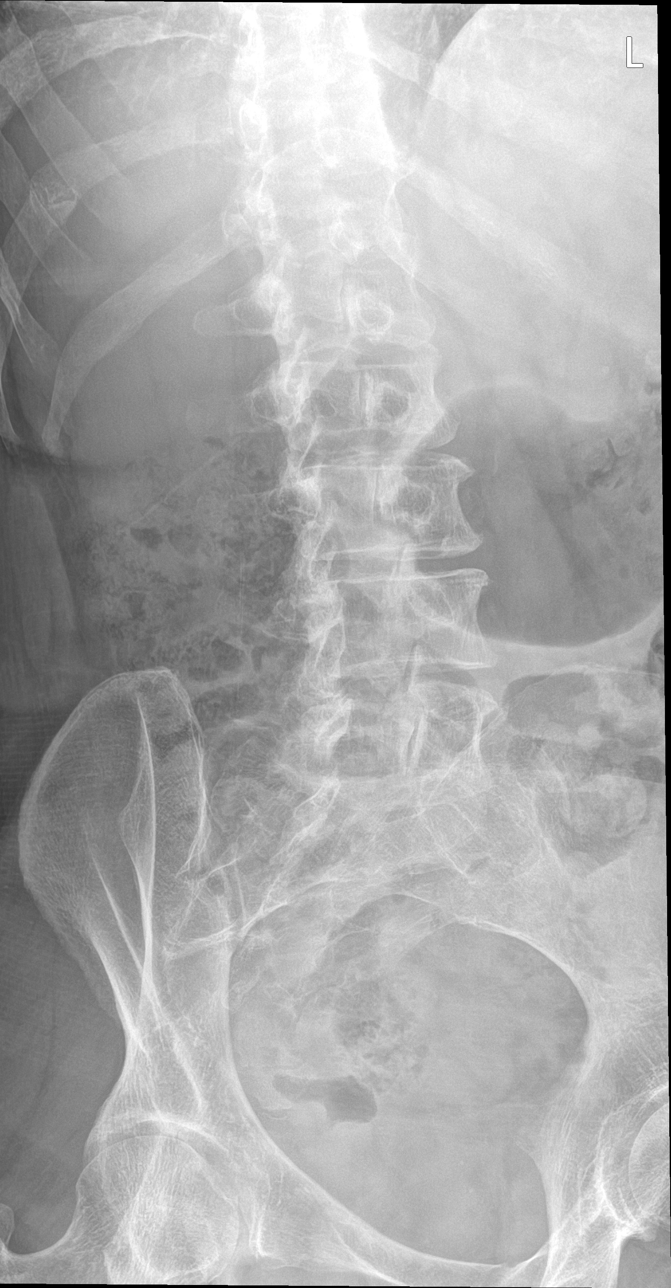

[l-spine lat]
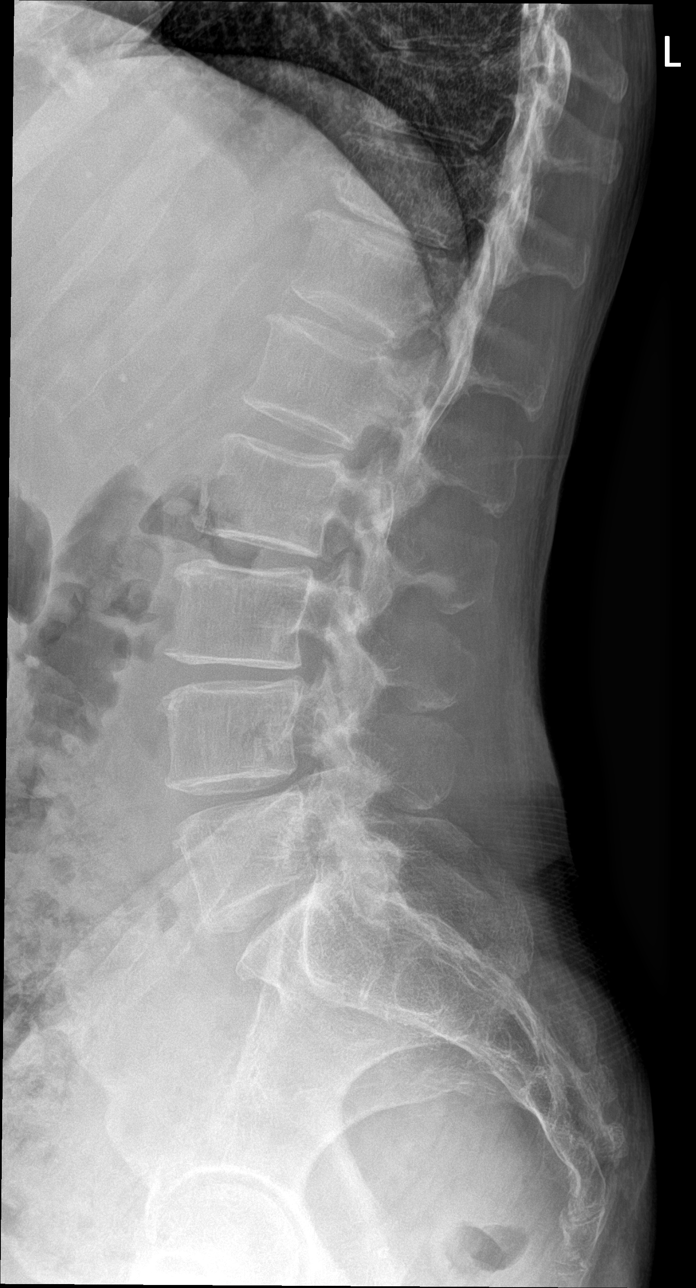

[l-spine spot]
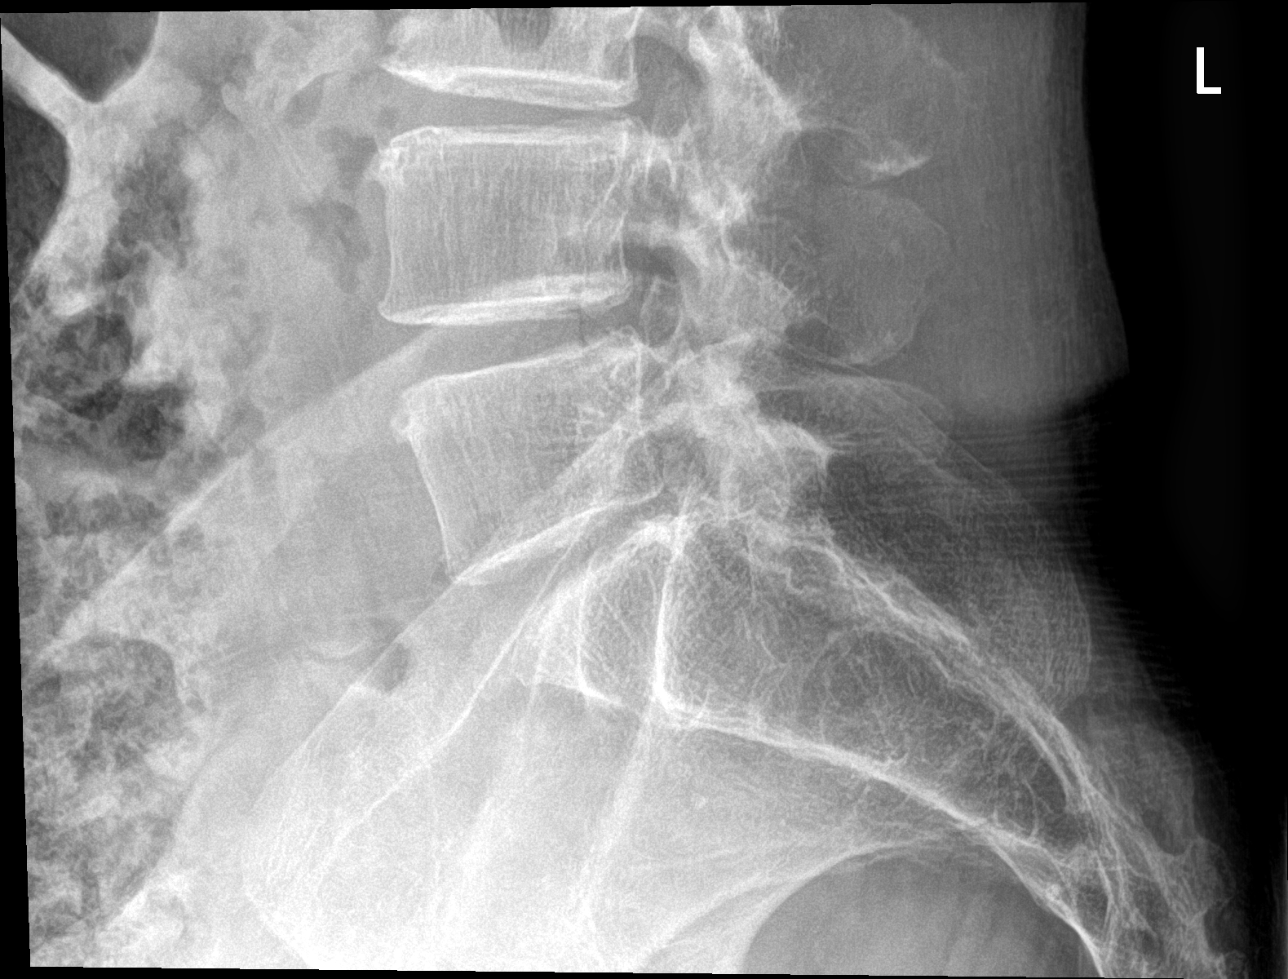

[l-spine ap]
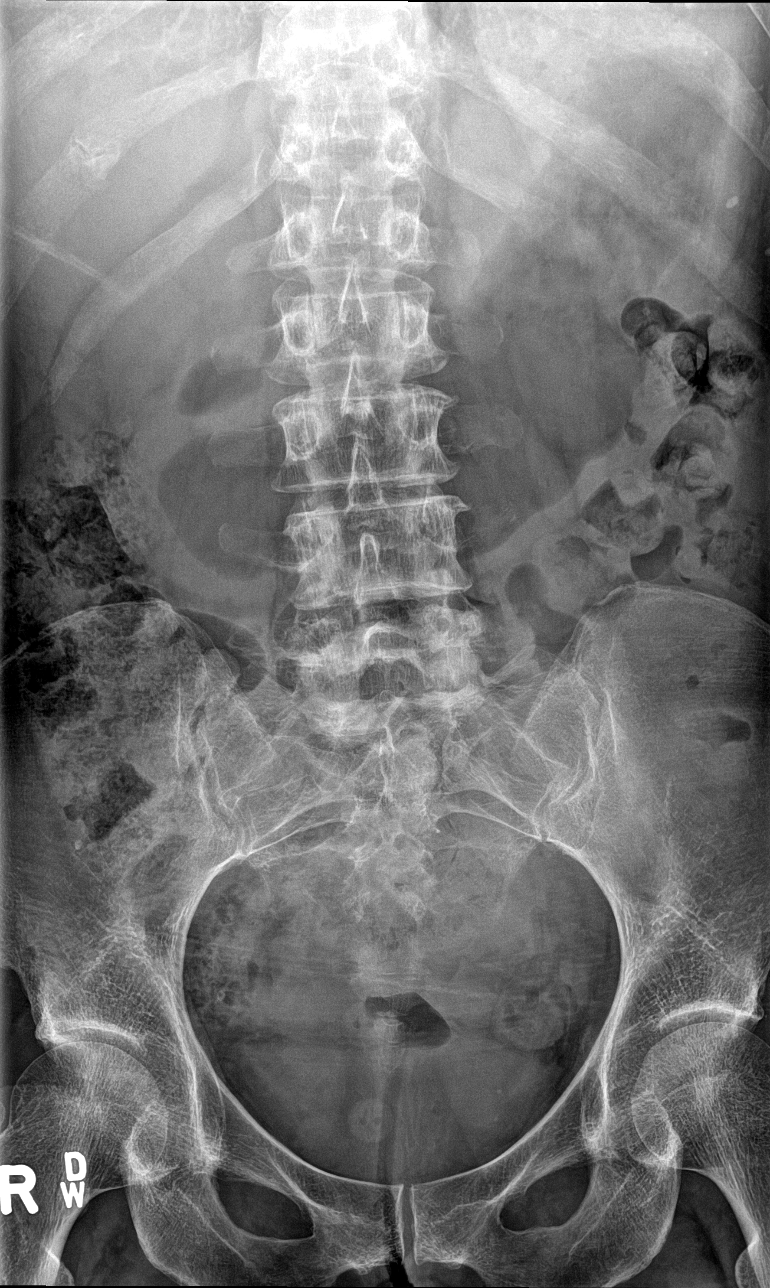

[5 of 5 positions shown; findings below may reference images not displayed]

FINDINGS: Normal lumbar lordosis. No acute fracture or listhesis of the lumbar
spine. Vertebral body height has been preserved. Congenital wedging
of the T12 vertebral body incidentally noted. Mild intervertebral
disc space narrowing and endplate remodeling of L2-3 and L3-4 is in
keeping with mild degenerative disc disease, stable since prior
examination. Oblique views demonstrate no evidence of pars defect.
The paraspinal soft tissues are unremarkable.
IMPRESSION: Stable examination.  Mild degenerative disc disease L2-L4.

## 2020-12-04 MED ORDER — METHOCARBAMOL 500 MG PO TABS: 500.0000 mg | ORAL_TABLET | Freq: Four times a day (QID) | ORAL | 0 refills | Status: DC

## 2020-12-04 MED ORDER — METHOCARBAMOL 500 MG PO TABS
1000.0000 mg | ORAL_TABLET | Freq: Once | ORAL | Status: AC
  Administered 2020-12-04: 1000 mg via ORAL
  Filled 2020-12-04: qty 2

## 2020-12-04 MED ORDER — KETOROLAC TROMETHAMINE 15 MG/ML IJ SOLN
30.0000 mg | Freq: Once | INTRAMUSCULAR | Status: AC
  Administered 2020-12-04: 30 mg via INTRAMUSCULAR
  Filled 2020-12-04: qty 2
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
## 2020-12-04 NOTE — ED Notes (Deleted)
Disregard note.

## 2020-12-04 NOTE — Discharge Instructions (Signed)
Please read and follow all provided instructions.  Your diagnoses today include:  1. Acute bilateral low back pain without sciatica     Tests performed today include: Vital signs - see below for your results today X-ray of the lower back and pelvis -shows some arthritis in the spine, otherwise no problems with the bones Blood counts and electrolytes  Medications prescribed:  Robaxin (methocarbamol) - muscle relaxer medication  DO NOT drive or perform any activities that require you to be awake and alert because this medicine can make you drowsy.   Take any prescribed medications only as directed.  Home care instructions:  Follow any educational materials contained in this packet Please rest, use ice or heat on your back for the next several days Do not lift, push, pull anything more than 10 pounds for the next week  Follow-up instructions: Please follow-up with your primary care provider in the next 1 week for further evaluation of your symptoms.   Return instructions:  SEEK IMMEDIATE MEDICAL ATTENTION IF YOU HAVE: New numbness, tingling, weakness, or problem with the use of your arms or legs Severe back pain not relieved with medications Loss control of your bowels or bladder Increasing pain in any areas of the body (such as chest or abdominal pain) Shortness of breath, dizziness, or fainting.  Worsening nausea (feeling sick to your stomach), vomiting, fever, or sweats Any other emergent concerns regarding your health   Additional Information:  Your vital signs today were: BP (!) 163/92 (BP Location: Right Arm)   Pulse 90   Temp 98 F (36.7 C) (Oral)   Resp 18   LMP  (LMP Unknown) Comment: approx 2 years   SpO2 93%  If your blood pressure (BP) was elevated above 135/85 this visit, please have this repeated by your doctor within one month. --------------

## 2020-12-04 NOTE — ED Triage Notes (Signed)
Patient reports lower back pain for a while. Reports her puppy jumped on her and almost made her leg buckle. Patient reports she "hurts everywhere" and her "feet are swollen"

## 2020-12-26 ENCOUNTER — Ambulatory Visit (HOSPITAL_COMMUNITY): Payer: Self-pay | Admitting: Licensed Clinical Social Worker

## 2021-01-09 ENCOUNTER — Ambulatory Visit (INDEPENDENT_AMBULATORY_CARE_PROVIDER_SITE_OTHER): Payer: No Payment, Other | Admitting: Licensed Clinical Social Worker

## 2021-01-09 ENCOUNTER — Encounter (HOSPITAL_COMMUNITY): Payer: Self-pay | Admitting: Licensed Clinical Social Worker

## 2021-01-09 ENCOUNTER — Other Ambulatory Visit: Payer: Self-pay

## 2021-01-09 DIAGNOSIS — F259 Schizoaffective disorder, unspecified: Secondary | ICD-10-CM | POA: Diagnosis not present

## 2021-01-09 DIAGNOSIS — F4310 Post-traumatic stress disorder, unspecified: Secondary | ICD-10-CM | POA: Insufficient documentation
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
## 2021-01-09 NOTE — Plan of Care (Signed)
Pt agreeable to plan  ?

## 2021-01-18 ENCOUNTER — Other Ambulatory Visit (HOSPITAL_BASED_OUTPATIENT_CLINIC_OR_DEPARTMENT_OTHER): Payer: Self-pay

## 2021-01-19 ENCOUNTER — Ambulatory Visit (AMBULATORY_SURGERY_CENTER): Payer: Self-pay

## 2021-01-19 VITALS — Ht 63.0 in | Wt 125.0 lb

## 2021-01-19 DIAGNOSIS — Z1211 Encounter for screening for malignant neoplasm of colon: Secondary | ICD-10-CM

## 2021-01-19 NOTE — Progress Notes (Signed)
No egg or soy allergy known to patient  No issues with past sedation with any surgeries or procedures Patient denies ever being told they had issues or difficulty with intubation  No FH of Malignant Hyperthermia No diet pills per patient No home 02 use per patient  No blood thinners per patient  Pt denies issues with constipation  No A fib or A flutter  EMMI video to pt or via [place] 19 guidelines implemented in PV today with Pt and CMA Pt is not fully vaccinated  for Covid      Due to the COVID-19 pandemic we are asking patients to follow certain guidelines.  Pt aware of COVID protocols and [facility] guidelines

## 2021-01-27 ENCOUNTER — Encounter: Payer: Self-pay | Admitting: Gastroenterology

## 2021-01-27 ENCOUNTER — Ambulatory Visit (AMBULATORY_SURGERY_CENTER): Payer: Self-pay | Admitting: Gastroenterology

## 2021-01-27 VITALS — BP 92/49 | HR 65 | Temp 95.0°F | Resp 18 | Ht 63.0 in | Wt 122.0 lb

## 2021-01-27 DIAGNOSIS — Z1211 Encounter for screening for malignant neoplasm of colon: Secondary | ICD-10-CM

## 2021-01-27 MED ORDER — SODIUM CHLORIDE 0.9 % IV SOLN
500.0000 mL | Freq: Once | INTRAVENOUS | Status: AC
Start: 2021-01-27 — End: ?

## 2021-01-27 NOTE — Patient Instructions (Signed)
YOU HAD AN ENDOSCOPIC PROCEDURE TODAY AT THE [facility]:   Refer to the procedure report that was given to you for any specific questions about what was found during the examination.  If the procedure report does not answer your questions, please call your gastroenterologist to clarify.  If you requested that your care partner not be given the details of your procedure findings, then the procedure report has been included in a sealed envelope for you to review at your convenience later.  YOU SHOULD EXPECT: Some feelings of bloating in the abdomen. Passage of more gas than usual.  Walking can help get rid of the air that was put into your GI tract during the procedure and reduce the bloating. If you had a lower endoscopy (such as a colonoscopy or flexible sigmoidoscopy) you may notice spotting of blood in your stool or on the toilet paper. If you underwent a bowel prep for your procedure, you may not have a normal bowel movement for a few days.  Please Note:  You might notice some irritation and congestion in your nose or some drainage.  This is from the oxygen used during your procedure.  There is no need for concern and it should clear up in a day or so.  SYMPTOMS TO REPORT IMMEDIATELY:   Following lower endoscopy (colonoscopy or flexible sigmoidoscopy):  Excessive amounts of blood in the stool  Significant tenderness or worsening of abdominal pains  Swelling of the abdomen that is new, acute  Fever of 100F or higher  For urgent or emergent issues, a gastroenterologist can be reached at any hour by calling [phone]. Do not use MyChart messaging for urgent concerns.    DIET:  We do recommend a small meal at first, but then you may proceed to your regular diet.  Drink plenty of fluids but you should avoid alcoholic beverages for 24 hours.  ACTIVITY:  You should plan to take it easy for the rest of today and you should NOT DRIVE or use heavy machinery until tomorrow (because  of the sedation medicines used during the test).    FOLLOW UP: Our staff will call the number listed on your records 48-72 hours following your procedure to check on you and address any questions or concerns that you may have regarding the information given to you following your procedure. If we do not reach you, we will leave a message.  We will attempt to reach you two times.  During this call, we will ask if you have developed any symptoms of COVID 19. If you develop any symptoms (ie: fever, flu-like symptoms, shortness of breath, cough etc.) before then, please call [phone].  If you test positive for Covid 19 in the 2 weeks post procedure, please call and report this information to us.    If any biopsies were taken you will be contacted by phone or by letter within the next 1-3 weeks.  Please call us at [phone] if you have not heard about the biopsies in 3 weeks.    SIGNATURES/CONFIDENTIALITY: You and/or your care partner have signed paperwork which will be entered into your electronic medical record.  These signatures attest to the fact that that the information above on your After Visit Summary has been reviewed and is understood.  Full responsibility of the confidentiality of this discharge information lies with you and/or your care-partner. 

## 2021-01-27 NOTE — Op Note (Signed)
[facility] Patient Name: Linda Cervantes Procedure Date: 01/27/2021 3:19 PM MRN: [id number] Endoscopist: Mauri Pole , MD Age: 55 Referring MD:  Date of Birth: 17-Jan-1966 Gender: Female Account #: [id number] Procedure:                Colonoscopy Indications:              Screening for colorectal malignant neoplasm Medicines:                Monitored Anesthesia Care Procedure:                Pre-Anesthesia Assessment:                           - Prior to the procedure, a History and Physical                            was performed, and patient medications and                            allergies were reviewed. The patient's tolerance of                            previous anesthesia was also reviewed. The risks                            and benefits of the procedure and the sedation                            options and risks were discussed with the patient.                            All questions were answered, and informed consent                            was obtained. Prior Anticoagulants: The patient has                            taken no previous anticoagulant or antiplatelet                            agents. ASA Grade Assessment: III - A patient with                            severe systemic disease. After reviewing the risks                            and benefits, the patient was deemed in                            satisfactory condition to undergo the procedure.                           After obtaining informed consent, the colonoscope  was passed under direct vision. Throughout the                            procedure, the patient's blood pressure, pulse, and                            oxygen saturations were monitored continuously. The                            0405 PCF-H190TL Slim SB Colonoscope was introduced                            through the anus and advanced to the the cecum,                             identified by appendiceal orifice and ileocecal                            valve. The colonoscopy was performed without                            difficulty. The patient tolerated the procedure                            well. The quality of the bowel preparation was                            adequate. The ileocecal valve, appendiceal orifice,                            and rectum were photographed. Scope In: 3:59:25 PM Scope Out: 4:11:32 PM Scope Withdrawal Time: 0 hours 7 minutes 20 seconds  Total Procedure Duration: 0 hours 12 minutes 7 seconds  Findings:                 The perianal and digital rectal examinations were                            normal.                           A few small-mouthed diverticula were found in the                            sigmoid colon.                           Non-bleeding external and internal hemorrhoids were                            found during retroflexion. The hemorrhoids were                            medium-sized.  The exam was otherwise without abnormality. Complications:            No immediate complications. Estimated Blood Loss:     Estimated blood loss was minimal. Impression:               - Diverticulosis in the sigmoid colon.                           - Non-bleeding external and internal hemorrhoids.                           - The examination was otherwise normal.                           - No specimens collected. Recommendation:           - Patient has a contact number available for                            emergencies. The signs and symptoms of potential                            delayed complications were discussed with the                            patient. Return to normal activities tomorrow.                            Written discharge instructions were provided to the                            patient.                           - Resume previous diet.                           - Continue  present medications.                           - Repeat colonoscopy in 10 years for screening                            purposes. Mauri Pole, MD 01/27/2021 4:15:42 PM This report has been signed electronically.

## 2021-01-27 NOTE — Progress Notes (Signed)
[facility] History and Physical   Primary Care Physician:  Azzie Glatter, FNP (Inactive)   Reason for Procedure:  Colorectal cancer screening  Plan:    Screening colonoscopy with possible interventions as needed     HPI: Linda Cervantes is a very pleasant 55 y.o. female here for screening colonoscopy. Denies any nausea, vomiting, abdominal pain, melena or bright red blood per rectum  The risks and benefits as well as alternatives of endoscopic procedure(s) have been discussed and reviewed. All questions answered. The patient agrees to proceed.    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 Authorizing Provider  albuterol (VENTOLIN HFA) 108 (90 Base) MCG/ACT inhaler Inhale 2 puffs into the lungs every 6 (six) hours as needed for wheezing (wheezing). 10/20/20   Vevelyn Francois, NP  cetirizine (ZYRTEC) 10 MG tablet Take 1 tablet (10 mg total) by mouth daily. 10/20/20   Vevelyn Francois, NP  gabapentin (NEURONTIN) 300 MG capsule Take 1 capsule (300 mg total) by mouth 3 (three) times daily. 10/20/20   Vevelyn Francois, NP  ibuprofen (ADVIL) 800 MG tablet Take 1 tablet (800 mg total) by mouth every 8 (eight) hours as needed. 10/20/20   Vevelyn Francois, NP  meclizine (ANTIVERT) 25 MG tablet  Take 1 tablet (25 mg total) by mouth 3 (three) times daily as needed for dizziness. 10/20/20   Vevelyn Francois, NP  mometasone (NASONEX) 50 MCG/ACT nasal spray Place 2 sprays into the nose daily. 10/20/20   Vevelyn Francois, NP  OLANZapine (ZYPREXA) 10 MG tablet Take 1 tablet (10 mg total) by mouth at bedtime. 10/20/20   Vevelyn Francois, NP  senna (SENOKOT) 8.6 MG TABS tablet Take 1 tablet (8.6 mg total) by mouth at bedtime. 11/25/18   Azzie Glatter, FNP  fluticasone (FLONASE) 50 MCG/ACT nasal spray Place 2 sprays into both nostrils daily. 02/18/19 02/18/19  Melynda Ripple, MD    Current Outpatient Medications  Medication Sig Dispense Refill   albuterol (VENTOLIN HFA) 108 (90 Base) MCG/ACT inhaler Inhale 2 puffs into the lungs every 6 (six) hours as needed for wheezing (wheezing). 6.7 g 3   cetirizine (ZYRTEC) 10 MG tablet Take 1 tablet (10 mg total) by mouth daily. 30 tablet 11   gabapentin (NEURONTIN) 300 MG capsule Take 1 capsule (300 mg total) by mouth 3 (three) times daily. 90 capsule 3   ibuprofen (ADVIL) 800 MG tablet Take 1 tablet (800 mg total) by mouth every 8 (eight) hours as needed. 30 tablet 3   meclizine (ANTIVERT) 25 MG tablet Take 1 tablet (25 mg total) by mouth 3 (three) times daily as needed for dizziness. 30 tablet 3  mometasone (NASONEX) 50 MCG/ACT nasal spray Place 2 sprays into the nose daily. 17 g 0   OLANZapine (ZYPREXA) 10 MG tablet Take 1 tablet (10 mg total) by mouth at bedtime. 30 tablet 3   senna (SENOKOT) 8.6 MG TABS tablet Take 1 tablet (8.6 mg total) by mouth at bedtime. 30 tablet 3   No current facility-administered medications for this visit.    Allergies as of 01/27/2021 - Review Complete 01/27/2021  Allergen Reaction Noted   Amitriptyline hcl Other (See Comments) 10/22/2011    Family History  Problem Relation Age of Onset   Hypertension Father    Valvular heart disease Father    Heart Problems Sister    Rheumatic fever Paternal Uncle    Diabetes  Maternal Grandmother    Colon cancer Neg Hx    Esophageal cancer Neg Hx    Pancreatic cancer Neg Hx    Stomach cancer Neg Hx     Social History   Socioeconomic History   Marital status: Legally Separated    Spouse name: Not on file   Number of children: Not on file   Years of education: Not on file   Highest education level: Not on file  Occupational History   Not on file  Tobacco Use   Smoking status: Every Day    Packs/day: 1.00    Years: 40.00    Pack years: 40.00    Types: Cigarettes    Passive exposure: Current   Smokeless tobacco: Never  Vaping Use   Vaping Use: Never used  Substance and Sexual Activity   Alcohol use: No   Drug use: Not Currently    Types: Cocaine, Heroin, IV    Comment: 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   Sexual activity: Not Currently  Other Topics Concern   Not on file  Social History Narrative   Not on file   Social Determinants of Health   Financial Resource Strain: Medium Risk   Difficulty of Paying Living Expenses: Somewhat hard  Food Insecurity: No Food Insecurity   Worried About Running Out of Food in the Last Year: Never true   Ran Out of Food in the Last Year: Never true  Transportation Needs: No Transportation Needs   Lack of Transportation (Medical): No   Lack of Transportation (Non-Medical): No  Physical Activity: Sufficiently Active   Days of Exercise per Week: 7 days   Minutes of Exercise per Session: 30 min  Stress: Stress Concern Present   Feeling of Stress : Very much  Social Connections: Socially Isolated   Frequency of Communication with Friends and Family: Once a week   Frequency of Social Gatherings with Friends and Family: Never   Attends Religious Services: Never   Marine scientist or Organizations: No   Attends Music therapist: Never   Marital Status: Separated  Intimate Partner Violence: Not At Risk   Fear of Current or Ex-Partner: No   Emotionally Abused: No    Physically Abused: No   Sexually Abused: No    Review of Systems:  All other review of systems negative except as mentioned in the HPI.  Physical Exam: Vital signs in last 24 hours: BP 128/83   Pulse 87   Temp (!) 95 F (35 C) (Temporal)   Ht 5\' 3"  (1.6 m)   Wt 122 lb (55.3 kg)   LMP  (LMP Unknown) Comment: approx 2 years   SpO2 98%   BMI 21.61 kg/m  General:   Alert, NAD Lungs:  Clear .   Heart:  Regular rate and rhythm Abdomen:  Soft, nontender and nondistended. Neuro/Psych:  Alert and cooperative. Normal mood and affect. A and O x 3  Reviewed labs, radiology imaging, old records and pertinent past GI work up  Patient is appropriate for planned procedure(s) and anesthesia in an ambulatory setting   K. Denzil Magnuson , MD [phone]

## 2021-01-27 NOTE — Progress Notes (Signed)
Sedate, gd SR, tolerated procedure well, VSS, report to RN 

## 2021-01-31 ENCOUNTER — Telehealth: Payer: Self-pay | Admitting: *Deleted

## 2021-01-31 NOTE — Telephone Encounter (Signed)
Message left

## 2021-02-07 ENCOUNTER — Ambulatory Visit (INDEPENDENT_AMBULATORY_CARE_PROVIDER_SITE_OTHER): Payer: No Payment, Other | Admitting: Licensed Clinical Social Worker

## 2021-02-07 DIAGNOSIS — F259 Schizoaffective disorder, unspecified: Secondary | ICD-10-CM | POA: Diagnosis not present

## 2021-02-07 DIAGNOSIS — F4310 Post-traumatic stress disorder, unspecified: Secondary | ICD-10-CM
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
## 2021-02-10 ENCOUNTER — Other Ambulatory Visit: Payer: Self-pay

## 2021-02-10 ENCOUNTER — Other Ambulatory Visit: Payer: Self-pay | Admitting: Nurse Practitioner

## 2021-02-10 MED ORDER — MOMETASONE FUROATE 50 MCG/ACT NA SUSP
2.0000 | Freq: Every day | NASAL | 0 refills | Status: DC
  Filled 2021-02-10: qty 17, 30d supply, fill #0

## 2021-02-12 ENCOUNTER — Emergency Department (HOSPITAL_BASED_OUTPATIENT_CLINIC_OR_DEPARTMENT_OTHER)
Admission: EM | Admit: 2021-02-12 | Discharge: 2021-02-13 | Disposition: A | Discharge status: Home / Self Care | Payer: Medicaid Other | Attending: Emergency Medicine | Admitting: Emergency Medicine

## 2021-02-12 ENCOUNTER — Encounter (HOSPITAL_BASED_OUTPATIENT_CLINIC_OR_DEPARTMENT_OTHER): Payer: Self-pay | Admitting: Obstetrics and Gynecology

## 2021-02-12 ENCOUNTER — Other Ambulatory Visit: Payer: Self-pay

## 2021-02-12 DIAGNOSIS — R0981 Nasal congestion: Secondary | ICD-10-CM | POA: Insufficient documentation

## 2021-02-12 DIAGNOSIS — R519 Headache, unspecified: Secondary | ICD-10-CM | POA: Insufficient documentation

## 2021-02-12 DIAGNOSIS — M5431 Sciatica, right side: Secondary | ICD-10-CM | POA: Insufficient documentation

## 2021-02-12 DIAGNOSIS — J3489 Other specified disorders of nose and nasal sinuses: Secondary | ICD-10-CM

## 2021-02-12 DIAGNOSIS — Z20822 Contact with and (suspected) exposure to covid-19: Secondary | ICD-10-CM | POA: Insufficient documentation

## 2021-02-12 DIAGNOSIS — F17210 Nicotine dependence, cigarettes, uncomplicated: Secondary | ICD-10-CM | POA: Insufficient documentation

## 2021-02-12 DIAGNOSIS — J45909 Unspecified asthma, uncomplicated: Secondary | ICD-10-CM | POA: Insufficient documentation

## 2021-02-12 LAB — RESP PANEL BY RT-PCR (FLU A&B, COVID) ARPGX2
Influenza A by PCR: NEGATIVE
Influenza B by PCR: NEGATIVE
SARS Coronavirus 2 by RT PCR: NEGATIVE

## 2021-02-12 MED ORDER — METHYLPREDNISOLONE 4 MG PO TBPK
ORAL_TABLET | ORAL | 0 refills | Status: DC
  Filled 2021-02-13: qty 21, 6d supply, fill #0

## 2021-02-12 NOTE — ED Notes (Signed)
ED Provider at bedside. 

## 2021-02-12 NOTE — ED Triage Notes (Signed)
Patient reports to the ER for back pain that goes around to her abdomen and stomach. Patient reports she is also having cough, congestion, and feels like she has a sinus problem

## 2021-02-13 ENCOUNTER — Other Ambulatory Visit: Payer: Self-pay

## 2021-02-13 NOTE — Discharge Instructions (Signed)
You were seen today for several complaints.  Your history is consistent with likely sciatica.  You do not have any pain at this time.  He also have recent history of sinus pressure and headache.  Make sure that you continue Flonase and nasal saline.  You will be given a Medrol Dosepak which should be helpful for both sciatica and any sinus congestion.  Follow-up with your primary provider if not improving in 1 week.
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
## 2021-02-16 ENCOUNTER — Other Ambulatory Visit: Payer: Self-pay

## 2021-02-16 ENCOUNTER — Ambulatory Visit (INDEPENDENT_AMBULATORY_CARE_PROVIDER_SITE_OTHER): Payer: No Payment, Other | Admitting: Physician Assistant

## 2021-02-16 ENCOUNTER — Encounter (HOSPITAL_COMMUNITY): Payer: Self-pay | Admitting: Physician Assistant

## 2021-02-16 VITALS — BP 145/98 | HR 93 | Ht 63.0 in | Wt 121.0 lb

## 2021-02-16 DIAGNOSIS — G4700 Insomnia, unspecified: Secondary | ICD-10-CM | POA: Diagnosis not present

## 2021-02-16 DIAGNOSIS — F411 Generalized anxiety disorder: Secondary | ICD-10-CM | POA: Diagnosis not present

## 2021-02-16 DIAGNOSIS — F259 Schizoaffective disorder, unspecified: Secondary | ICD-10-CM

## 2021-02-16 DIAGNOSIS — F4310 Post-traumatic stress disorder, unspecified: Secondary | ICD-10-CM | POA: Diagnosis not present

## 2021-02-16 MED ORDER — OLANZAPINE 15 MG PO TABS
15.0000 mg | ORAL_TABLET | Freq: Every day | ORAL | 3 refills | Status: DC
  Filled 2021-02-16: qty 30, 30d supply, fill #0
  Filled 2021-04-10: qty 30, 30d supply, fill #1
  Filled 2021-04-11: qty 30, 30d supply, fill #0
  Filled 2021-05-19: qty 30, 30d supply, fill #1

## 2021-02-16 MED ORDER — FLUOXETINE HCL 10 MG PO CAPS
10.0000 mg | ORAL_CAPSULE | Freq: Every day | ORAL | 1 refills | Status: DC
  Filled 2021-02-16: qty 30, 30d supply, fill #0
  Filled 2021-04-06: qty 30, 30d supply, fill #1
  Filled 2021-04-07 – 2021-04-10 (×2): qty 30, 30d supply, fill #0

## 2021-02-16 MED ORDER — MIRTAZAPINE 7.5 MG PO TABS
7.5000 mg | ORAL_TABLET | Freq: Every day | ORAL | 1 refills | Status: DC
Start: 2021-02-16 — End: 2021-05-19
  Filled 2021-02-16: qty 30, 30d supply, fill #0
  Filled 2021-04-06: qty 30, 30d supply, fill #1
  Filled 2021-04-07: qty 30, 30d supply, fill #0
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
## 2021-03-02 ENCOUNTER — Emergency Department (HOSPITAL_BASED_OUTPATIENT_CLINIC_OR_DEPARTMENT_OTHER)
Admission: EM | Admit: 2021-03-02 | Discharge: 2021-03-02 | Disposition: A | Discharge status: Home / Self Care | Payer: Medicaid Other | Attending: Emergency Medicine | Admitting: Emergency Medicine

## 2021-03-02 ENCOUNTER — Other Ambulatory Visit: Payer: Self-pay

## 2021-03-02 ENCOUNTER — Encounter (HOSPITAL_BASED_OUTPATIENT_CLINIC_OR_DEPARTMENT_OTHER): Payer: Self-pay

## 2021-03-02 ENCOUNTER — Ambulatory Visit (INDEPENDENT_AMBULATORY_CARE_PROVIDER_SITE_OTHER): Payer: No Payment, Other | Admitting: Licensed Clinical Social Worker

## 2021-03-02 DIAGNOSIS — Z20822 Contact with and (suspected) exposure to covid-19: Secondary | ICD-10-CM | POA: Insufficient documentation

## 2021-03-02 DIAGNOSIS — B349 Viral infection, unspecified: Secondary | ICD-10-CM | POA: Insufficient documentation

## 2021-03-02 DIAGNOSIS — F4310 Post-traumatic stress disorder, unspecified: Secondary | ICD-10-CM

## 2021-03-02 DIAGNOSIS — F259 Schizoaffective disorder, unspecified: Secondary | ICD-10-CM

## 2021-03-02 DIAGNOSIS — J45909 Unspecified asthma, uncomplicated: Secondary | ICD-10-CM | POA: Insufficient documentation

## 2021-03-02 DIAGNOSIS — Z7951 Long term (current) use of inhaled steroids: Secondary | ICD-10-CM | POA: Insufficient documentation

## 2021-03-02 DIAGNOSIS — F17210 Nicotine dependence, cigarettes, uncomplicated: Secondary | ICD-10-CM | POA: Insufficient documentation

## 2021-03-02 LAB — RESP PANEL BY RT-PCR (FLU A&B, COVID) ARPGX2
Influenza A by PCR: NEGATIVE
Influenza B by PCR: NEGATIVE
SARS Coronavirus 2 by RT PCR: NEGATIVE
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
## 2021-03-02 NOTE — ED Triage Notes (Signed)
Flu like symptoms; body aches, cough for since 02/07/2021.

## 2021-03-06 ENCOUNTER — Ambulatory Visit (INDEPENDENT_AMBULATORY_CARE_PROVIDER_SITE_OTHER): Payer: Self-pay | Admitting: Nurse Practitioner

## 2021-03-06 ENCOUNTER — Encounter: Payer: Self-pay | Admitting: Nurse Practitioner

## 2021-03-06 ENCOUNTER — Ambulatory Visit (HOSPITAL_COMMUNITY)
Admission: RE | Admit: 2021-03-06 | Discharge: 2021-03-06 | Disposition: A | Discharge status: Home / Self Care | Payer: Medicaid Other | Source: Ambulatory Visit | Attending: Nurse Practitioner | Admitting: Nurse Practitioner

## 2021-03-06 ENCOUNTER — Other Ambulatory Visit: Payer: Self-pay

## 2021-03-06 VITALS — BP 125/53 | HR 76 | Temp 96.8°F | Ht 63.0 in | Wt 128.0 lb

## 2021-03-06 DIAGNOSIS — J069 Acute upper respiratory infection, unspecified: Secondary | ICD-10-CM

## 2021-03-06 DIAGNOSIS — H9192 Unspecified hearing loss, left ear: Secondary | ICD-10-CM

## 2021-03-06 DIAGNOSIS — R0602 Shortness of breath: Secondary | ICD-10-CM

## 2021-03-06 MED ORDER — AZITHROMYCIN 250 MG PO TABS
ORAL_TABLET | ORAL | 0 refills | Status: AC
  Filled 2021-03-06: qty 6, 5d supply, fill #0

## 2021-03-06 MED ORDER — IPRATROPIUM-ALBUTEROL 0.5-2.5 (3) MG/3ML IN SOLN
3.0000 mL | RESPIRATORY_TRACT | 0 refills | Status: DC | PRN
  Filled 2021-03-06: qty 360, 10d supply, fill #0

## 2021-03-06 MED ORDER — BENZONATATE 100 MG PO CAPS
100.0000 mg | ORAL_CAPSULE | Freq: Two times a day (BID) | ORAL | 0 refills | Status: DC | PRN
  Filled 2021-03-06: qty 20, 10d supply, fill #0

## 2021-03-06 MED ORDER — BENZONATATE 200 MG PO CAPS
200.0000 mg | ORAL_CAPSULE | Freq: Two times a day (BID) | ORAL | 0 refills | Status: DC | PRN
  Filled 2021-03-06: qty 20, 10d supply, fill #0

## 2021-03-06 MED ORDER — PREDNISONE 20 MG PO TABS
20.0000 mg | ORAL_TABLET | Freq: Two times a day (BID) | ORAL | 0 refills | Status: AC
  Filled 2021-03-06: qty 10, 5d supply, fill #0

## 2021-03-06 NOTE — Patient Instructions (Signed)
You were seen today in the [facility] for shortness of breath and URI.  You were prescribed medications, please take as directed. Please follow up in 1 mth for reevaluation of symptoms.

## 2021-03-06 NOTE — Progress Notes (Signed)
[place], [place]  [address] Phone:  [phone]   Fax:  [phone] Subjective:   Patient ID: Linda Cervantes, female    DOB: 1965-09-27, 55 y.o.   MRN: [id number]  Chief Complaint  Patient presents with   Acute Visit    Has been sick in 11/8, went to ED 11/13 , 12/1 neg COVID.  Needing note to go back in to work. Sx: stuffy nose,congestion    HPI Linda Cervantes 55 y.o. female  has a past medical history of Anxiety, Asthma, Back pain, Depression, Endometriosis, GERD (gastroesophageal reflux disease), Heart murmur, NARCOTIC DEPENDENCE AND WITHDRAWAL (09/28/2011), Paranoid schizophrenia ([place]), and Substance abuse ([place]) (Clean as of 2009). To the [facility] for reevaluation of upper respiratory symptoms  Patient states that she began having symptoms on 11/8 and was evaluated in the ED on 11/13, at which time she tested negative for Covid. Was evaluated in the ED again on 12/1, with worsening URI and diagnosed with influenza, but Covid negative. States that she continues to have symptoms today, but has improved immensely. Endorses productive cough with clear sputum and nasal congestion with green nasal drainage. Has been taking OTC medications for symptoms, with mild to moderate improvement. Verbalizes having some shortness of breath, but indicates that this is chronic. Requesting letter to return to work, since symptoms have improved. States that she is unable to return to work, without permission from PCP. Currently smokes 1 PPD x 41. Endorses sick contact, significant other has had pneumonia over the past 1-2 wks.   Patient also concerned about chronic decreased hearing in the left ear, states that at times, hearing sounds muffled. Denies any pain or drainage. Requesting referral to ENT for further evaluation and management. Denies any past trauma or injury.  Denies any other complaints today. Denies any fever. Denies any fatigue, chest pain, HA or  dizziness. Denies any blurred vision, numbness or tingling.    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) Clean as of 2009   Crack cocaine    Past Surgical History:  Procedure Laterality Date   CESAREAN SECTION     FOOT SURGERY     INCISION AND DRAINAGE OF WOUND Right 05/22/2018   Procedure: IRRIGATION AND DEBRIDEMENT RIGHT ARM WITH REMOVAL FOREIGN BODY;  Surgeon: [name], MD;  Location: [facility];  Service: Orthopedics;  Laterality: Right;   TUBAL LIGATION  2000    Family History  Problem Relation Age of Onset   Hypertension Father    Valvular heart disease Father    Heart Problems Sister    Rheumatic fever Paternal Uncle    Diabetes Maternal Grandmother    Colon cancer Neg Hx    Esophageal cancer Neg Hx    Pancreatic cancer Neg Hx    Stomach cancer Neg Hx     Social History   Socioeconomic History   Marital status: Legally Separated    Spouse name: Not on file   Number of children: Not on file   Years of education: Not on file   Highest education level: Not on file  Occupational History   Not on file  Tobacco Use  Smoking status: Every Day    Packs/day: 1.00    Years: 40.00    Pack years: 40.00    Types: Cigarettes    Passive exposure: Current   Smokeless tobacco: Never  Vaping Use   Vaping Use: Never used  Substance and Sexual Activity   Alcohol use: No   Drug use: Not Currently    Types: Cocaine, Heroin, IV    Comment: 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   Sexual activity: Not Currently  Other Topics Concern   Not on file  Social History Narrative   Not on file   Social Determinants of Health   Financial Resource Strain: Medium Risk   Difficulty of Paying Living Expenses: Somewhat  hard  Food Insecurity: No Food Insecurity   Worried About Running Out of Food in the Last Year: Never true   Ran Out of Food in the Last Year: Never true  Transportation Needs: No Transportation Needs   Lack of Transportation (Medical): No   Lack of Transportation (Non-Medical): No  Physical Activity: Sufficiently Active   Days of Exercise per Week: 7 days   Minutes of Exercise per Session: 30 min  Stress: Stress Concern Present   Feeling of Stress : Very much  Social Connections: Socially Isolated   Frequency of Communication with Friends and Family: Once a week   Frequency of Social Gatherings with Friends and Family: Never   Attends Religious Services: Never   Marine scientist or Organizations: No   Attends Music therapist: Never   Marital Status: Separated  Intimate Partner Violence: Not At Risk   Fear of Current or Ex-Partner: No   Emotionally Abused: No   Physically Abused: No   Sexually Abused: No    Outpatient Medications Prior to Visit  Medication Sig Dispense Refill   albuterol (VENTOLIN HFA) 108 (90 Base) MCG/ACT inhaler Inhale 2 puffs into the lungs every 6 (six) hours as needed for wheezing (wheezing). 6.7 g 3   cetirizine (ZYRTEC) 10 MG tablet Take 1 tablet (10 mg total) by mouth daily. 30 tablet 11   FLUoxetine (PROZAC) 10 MG capsule Take 1 capsule (10 mg total) by mouth daily. 30 capsule 1   gabapentin (NEURONTIN) 300 MG capsule Take 1 capsule (300 mg total) by mouth 3 (three) times daily. 90 capsule 3   ibuprofen (ADVIL) 800 MG tablet Take 1 tablet (800 mg total) by mouth every 8 (eight) hours as needed. 30 tablet 3   meclizine (ANTIVERT) 25 MG tablet Take 1 tablet (25 mg total) by mouth 3 (three) times daily as needed for dizziness. 30 tablet 3   METHADONE HCL PO Take 1 Dose by mouth daily.     mirtazapine (REMERON) 7.5 MG tablet Take 1 tablet (7.5 mg total) by mouth at bedtime. 30 tablet 1   mometasone (NASONEX) 50 MCG/ACT nasal spray Place 2  sprays into the nose daily. 17 g 0   OLANZapine (ZYPREXA) 15 MG tablet Take 1 tablet (15 mg total) by mouth at bedtime. 30 tablet 3   methylPREDNISolone (MEDROL DOSEPAK) 4 MG TBPK tablet Take as directed on packet 21 tablet 0   senna (SENOKOT) 8.6 MG TABS tablet Take 1 tablet (8.6 mg total) by mouth at bedtime. (Patient not taking: Reported on 03/06/2021) 30 tablet 3   Facility-Administered Medications Prior to Visit  Medication Dose Route Frequency Provider Last Rate Last Admin   0.9 %  sodium chloride infusion  500 mL Intravenous Once Nandigam,  Venia Minks, MD        Allergies  Allergen Reactions   Amitriptyline Hcl Other (See Comments)    Face Swelling    Review of Systems  Constitutional:  Negative for chills, fever and malaise/fatigue.  HENT:         See HPI  Eyes: Negative.   Respiratory:  Positive for cough, sputum production and shortness of breath.        See HPI  Cardiovascular:  Negative for chest pain, palpitations and leg swelling.  Gastrointestinal:  Negative for abdominal pain, blood in stool, constipation, diarrhea, nausea and vomiting.  Skin: Negative.   Neurological: Negative.   Psychiatric/Behavioral:  Negative for depression. The patient is not nervous/anxious.   All other systems reviewed and are negative.     Objective:    Physical Exam Vitals reviewed.  Constitutional:      General: She is not in acute distress.    Appearance: Normal appearance. She is normal weight.  HENT:     Head: Normocephalic.     Right Ear: Tympanic membrane, ear canal and external ear normal.     Left Ear: Tympanic membrane, ear canal and external ear normal.     Nose: Congestion present. No rhinorrhea.     Mouth/Throat:     Mouth: Mucous membranes are moist.     Pharynx: Oropharynx is clear.  Eyes:     Extraocular Movements: Extraocular movements intact.     Conjunctiva/sclera: Conjunctivae normal.     Pupils: Pupils are equal, round, and reactive to light.   Cardiovascular:     Rate and Rhythm: Normal rate and regular rhythm.     Pulses: Normal pulses.     Heart sounds: Normal heart sounds.     Comments: No obvious peripheral edema Pulmonary:     Effort: Pulmonary effort is normal. No respiratory distress.     Breath sounds: Wheezing present.     Comments: Diminished lung sounds in the bilateral lower lobes  Chest:     Chest wall: No tenderness.  Musculoskeletal:     Cervical back: Normal range of motion and neck supple.  Lymphadenopathy:     Cervical: No cervical adenopathy.  Skin:    General: Skin is warm and dry.     Capillary Refill: Capillary refill takes less than 2 seconds.  Neurological:     General: No focal deficit present.     Mental Status: She is alert and oriented to person, place, and time.  Psychiatric:        Mood and Affect: Mood normal.        Behavior: Behavior normal.        Thought Content: Thought content normal.        Judgment: Judgment normal.    BP (!) 125/53   Pulse 76   Temp (!) 96.8 F (36 C)   Ht 5' 3" (1.6 m)   Wt 128 lb (58.1 kg)   LMP  (LMP Unknown) Comment: approx 2 years   SpO2 96%   BMI 22.67 kg/m  Wt Readings from Last 3 Encounters:  03/06/21 128 lb (58.1 kg)  03/02/21 121 lb 0.5 oz (54.9 kg)  01/27/21 122 lb (55.3 kg)     There is no immunization history on file for this patient.  Diabetic Foot Exam - Simple   No data filed     Lab Results  Component Value Date   TSH 0.750 10/20/2020   Lab Results  Component Value Date  WBC 6.4 12/04/2020   HGB 11.3 (L) 12/04/2020   HCT 35.8 (L) 12/04/2020   MCV 82.7 12/04/2020   PLT 189 12/04/2020   Lab Results  Component Value Date   NA 137 12/04/2020   K 3.9 12/04/2020   CO2 28 12/04/2020   GLUCOSE 116 (H) 12/04/2020   BUN 10 12/04/2020   CREATININE 0.69 12/04/2020   BILITOT 0.4 12/04/2020   ALKPHOS 77 12/04/2020   AST 19 12/04/2020   ALT 12 12/04/2020   PROT 7.4 12/04/2020   ALBUMIN 3.6 12/04/2020   CALCIUM 9.4  12/04/2020   ANIONGAP 7 12/04/2020   EGFR 94 10/20/2020   Lab Results  Component Value Date   CHOL 184 10/20/2020   CHOL 197 02/04/2018   CHOL 209 (H) 10/03/2010   Lab Results  Component Value Date   HDL 42 10/20/2020   HDL 49 02/04/2018   HDL 44 10/03/2010   Lab Results  Component Value Date   LDLCALC 123 (H) 10/20/2020   LDLCALC 132 (H) 02/04/2018   LDLCALC 135 (H) 10/03/2010   Lab Results  Component Value Date   TRIG 103 10/20/2020   TRIG 81 02/04/2018   TRIG 152 (H) 10/03/2010   Lab Results  Component Value Date   CHOLHDL 4.4 10/20/2020   CHOLHDL 4.0 02/04/2018   CHOLHDL 4.8 10/03/2010   Lab Results  Component Value Date   HGBA1C 5.3 02/04/2018   HGBA1C 5.4 03/20/2017       Assessment & Plan:   Problem List Items Addressed This Visit   None Visit Diagnoses     Upper respiratory tract infection, unspecified type    -  Primary   Relevant Medications   predniSONE (DELTASONE) 20 MG tablet   azithromycin (ZITHROMAX) 250 MG tablet   ipratropium-albuterol (DUONEB) 0.5-2.5 (3) MG/3ML SOLN   benzonatate (TESSALON) 100 MG capsule Discussed smoking cessation  Discussed non pharmacological methods for management of  symptoms Informed to continue taking OTC medications as needed   Shortness of breath       Relevant Medications   predniSONE (DELTASONE) 20 MG tablet   azithromycin (ZITHROMAX) 250 MG tablet   ipratropium-albuterol (DUONEB) 0.5-2.5 (3) MG/3ML SOLN   benzonatate (TESSALON) 100 MG capsule   Other Relevant Orders   DG Chest 2 View   For home use only DME Nebulizer machine   Ambulatory referral to Pulmonology, due to concern for possible COPD Patient given extended work note due to red flags on PE and HPI   Decreased hearing of left ear       Relevant Orders   Ambulatory referral to ENT Given return precautions   Follow up in 1 mth for reevaluation of upper respiratory symptoms and shortness of breath, sooner as needed.    I have discontinued  Lennette Fader. Matsuo's senna and methylPREDNISolone. I have also changed her benzonatate. Additionally, I am having her start on predniSONE, azithromycin, and ipratropium-albuterol. Lastly, I am having her maintain her cetirizine, albuterol, meclizine, ibuprofen, gabapentin, METHADONE HCL PO, mometasone, OLANZapine, mirtazapine, and FLUoxetine. We will continue to administer sodium chloride.  Meds ordered this encounter  Medications   predniSONE (DELTASONE) 20 MG tablet    Sig: Take 1 tablet (20 mg total) by mouth 2 (two) times daily with a meal for 5 days.    Dispense:  10 tablet    Refill:  0   azithromycin (ZITHROMAX) 250 MG tablet    Sig: Take 2 tablets on day 1, then 1 tablet daily on days  2 through 5    Dispense:  6 tablet    Refill:  0   DISCONTD: benzonatate (TESSALON) 200 MG capsule    Sig: Take 1 capsule (200 mg total) by mouth 2 (two) times daily as needed for cough.    Dispense:  20 capsule    Refill:  0   ipratropium-albuterol (DUONEB) 0.5-2.5 (3) MG/3ML SOLN    Sig: Take 3 mLs by nebulization every 2 (two) hours as needed (shortness of breath/ cough).    Dispense:  360 mL    Refill:  0   benzonatate (TESSALON) 100 MG capsule    Sig: Take 1 capsule (100 mg total) by mouth 2 (two) times daily as needed for cough.    Dispense:  20 capsule    Refill:  0      Teena Dunk, NP

## 2021-03-07 ENCOUNTER — Other Ambulatory Visit: Payer: Self-pay

## 2021-03-09 ENCOUNTER — Other Ambulatory Visit: Payer: Self-pay

## 2021-03-13 ENCOUNTER — Other Ambulatory Visit: Payer: Self-pay

## 2021-03-13 ENCOUNTER — Ambulatory Visit (INDEPENDENT_AMBULATORY_CARE_PROVIDER_SITE_OTHER): Payer: Self-pay | Admitting: Internal Medicine

## 2021-03-13 ENCOUNTER — Encounter: Payer: Self-pay | Admitting: Internal Medicine

## 2021-03-13 VITALS — BP 130/84 | HR 85 | Resp 18 | Ht 63.0 in | Wt 127.0 lb

## 2021-03-13 DIAGNOSIS — Z23 Encounter for immunization: Secondary | ICD-10-CM

## 2021-03-13 DIAGNOSIS — J441 Chronic obstructive pulmonary disease with (acute) exacerbation: Secondary | ICD-10-CM

## 2021-03-13 DIAGNOSIS — U099 Post covid-19 condition, unspecified: Secondary | ICD-10-CM

## 2021-03-13 DIAGNOSIS — F17210 Nicotine dependence, cigarettes, uncomplicated: Secondary | ICD-10-CM

## 2021-03-13 DIAGNOSIS — R0602 Shortness of breath: Secondary | ICD-10-CM

## 2021-03-13 MED ORDER — BREZTRI AEROSPHERE 160-9-4.8 MCG/ACT IN AERO
2.0000 | INHALATION_SPRAY | Freq: Two times a day (BID) | RESPIRATORY_TRACT | 3 refills | Status: DC
  Filled 2021-03-13: qty 10.7, fill #0

## 2021-03-13 MED ORDER — BREZTRI AEROSPHERE 160-9-4.8 MCG/ACT IN AERO
2.0000 | INHALATION_SPRAY | Freq: Two times a day (BID) | RESPIRATORY_TRACT | 3 refills | Status: AC
  Filled 2021-03-13: qty 10.7, 30d supply, fill #0
  Filled 2021-04-06: qty 32.1, fill #0
  Filled 2021-04-07: qty 10.7, 30d supply, fill #0
  Filled 2021-05-19: qty 10.7, 30d supply, fill #1

## 2021-03-13 NOTE — Patient Instructions (Addendum)
Please schedule follow up scheduled with myself in 2 months.  If my schedule is not open yet, we will contact you with a reminder closer to that time. Please call [phone] if you haven't heard from [place] a month before.   Start breztri inhaler 2 puffs in the morning, 2 puffs at night. Gargle after use.  Take albuterol inhaler as needed   Quit smoking!  Understanding COPD   What is COPD? COPD stands for chronic obstructive pulmonary (lung) disease. COPD is a general term used for several lung diseases.  COPD is an umbrella term and encompasses other  common diseases in this group like chronic bronchitis and emphysema. Chronic asthma may also be included in this group. While some patients with COPD have only chronic bronchitis or emphysema, most patients have a combination of both.  You might hear these terms used in exchange for one another.   COPD adds to the work of the heart. Diseased lungs may reduce the amount of oxygen that goes to the blood. High blood pressure in blood vessels from the heart to the lungs makes it difficult for the heart to pump. Lung disease can also cause the body to produce too many red blood cells which may make the blood thicker and harder to pump.   Patients who have COPD with low oxygen levels may develop an enlarged heart (cor pulmonale). This condition weakens the heart and causes increased shortness of breath and swelling in the legs and feet.   Chronic bronchitis Chronic bronchitis is irritation and inflammation (swelling) of the lining in the bronchial tubes (air passages). The irritation causes coughing and an excess amount of mucus in the airways. The swelling makes it difficult to get air in and out of the lungs. The small, hair-like structures on the inside of the airways (called cilia) may be damaged by the irritation. The cilia are then unable to help clean mucus from the airways.  Bronchitis is generally considered to be chronic when you have: a  productive cough (cough up mucus) and shortness of breath that lasts about 3 months or more each year for 2 or more years in a row. Your doctor may define chronic bronchitis differently.   Emphysema Emphysema is the destruction, or breakdown, of the walls of the alveoli (air sacs) located at the end of the bronchial tubes. The damaged alveoli are not able to exchange oxygen and carbon dioxide between the lungs and the blood. The bronchioles lose their elasticity and collapse when you exhale, trapping air in the lungs. The trapped air keeps fresh air and oxygen from entering the lungs.   Who is affected by COPD? Emphysema and chronic bronchitis affect approximately 16 million people in the [place], or close to 11 percent of the population.   Symptoms of COPD  Shortness of breath  Shortness of breath with mild exercise (walking, using the stairs, etc.)  Chronic, productive cough (with mucus)  A feeling of "tightness" in the chest  Wheezing   What causes COPD? The two primary causes of COPD are cigarette smoking and alpha1-antitrypsin (AAT) deficiency. Air pollution and occupational dusts may also contribute to COPD, especially when the person exposed to these substances is a cigarette smoker.  Cigarette smoke causes COPD by irritating the airways and creating inflammation that narrows the airways, making it more difficult to breathe. Cigarette smoke also causes the cilia to stop working properly so mucus and trapped particles are not cleaned from the airways. As a result,  chronic cough and excess mucus production develop, leading to chronic bronchitis.  In some people, chronic bronchitis and infections can lead to destruction of the small airways, or emphysema.  AAT deficiency, an inherited disorder, can also lead to emphysema. Alpha antitrypsin (AAT) is a protective material produced in the liver and transported to the lungs to help combat inflammation. When there is not enough of the  chemical AAT, the body is no longer protected from an enzyme in the white blood cells.   How is COPD diagnosed?  To diagnose COPD, the physician needs to know: Do you smoke?  Have you had chronic exposure to dust or air pollutants?  Do other members of your family have lung disease?  Are you short of breath?  Do you get short of breath with exercise?  Do you have chronic cough and/or wheezing?  Do you cough up excess mucus?  To help with the diagnosis, the physician will conduct a thorough physical exam which includes:  Listening to your lungs and heart  Checking your blood pressure and pulse  Examining your nose and throat  Checking your feet and ankles for swelling   Laboratory and other tests Several laboratory and other tests are needed to confirm a diagnosis of COPD. These tests may include:  Chest X-ray to look for lung changes that could be caused by COPD   Spirometry and pulmonary function tests (PFTs) to determine lung volume and air flow  Pulse oximetry to measure the saturation of oxygen in the blood  Arterial blood gases (ABGs) to determine the amount of oxygen and carbon dioxide in the blood  Exercise testing to determine if the oxygen level in the blood drops during exercise   Treatment In the beginning stages of COPD, there is minimal shortness of breath that may be noticed only during exercise. As the disease progresses, shortness of breath may worsen and you may need to wear an oxygen device.   To help control other symptoms of COPD, the following treatments and lifestyle changes may be prescribed.  Quitting smoking  Avoiding cigarette smoke and other irritants  Taking medications including: a. bronchodilators b. anti-inflammatory agents c. oxygen d. antibiotics  Maintaining a healthy diet  Following a structured exercise program such as pulmonary rehabilitation Preventing respiratory infections  Controlling stress   If your COPD progresses, you may be  eligible to be evaluated for lung volume reduction surgery or lung transplantation. You may also be eligible to participate in certain clinical trials (research studies). Ask your health care providers about studies being conducted in your hospital.   What is the outlook? Although COPD can not be cured, its symptoms can be treated and your quality of life can be improved. Your prognosis or outlook for the future will depend on how well your lungs are functioning, your symptoms, and how well you respond to and follow your treatment plan.

## 2021-03-13 NOTE — Progress Notes (Signed)
The patient has been prescribed the inhaler Breztri and albuterol. Inhaler technique was demonstrated to patient. The patient subsequently demonstrated correct technique.

## 2021-03-13 NOTE — Progress Notes (Signed)
LUVIA ORZECHOWSKI    [id number]    1966/03/20  Primary Care Physician:King, Diona Foley, NP  Referring Physician: Vevelyn Francois, NP [address] [place],  [place] [address] Reason for Consultation: cough Date of Consultation: 03/13/2021  Chief complaint:   Chief Complaint  Patient presents with   Cough   COPD     HPI: Linda Cervantes is a 55 y.o. woman with COPD who presents for new patient evaluation.   Here with coughing, shortness of breath, chest pain. Symptoms for the past 3-4 months. Denies recurrent bronchitis. Last had azithromycin and prednisone from primary care NP last week. This was the first time she has needed this.   She is recently insured and is then going to start inhaler therapy.   No childhood respiratory disease. Two children with asthma.  She lives at home with boyfriend who also smokes.   She has been given breathing treatments in the past and this helped a lot.    CAT Score 03/13/2021  Total CAT Score 38    Social history:  Occupation: works at [employer] part time with [employer] - hasn't been to work because of her breathing in a month.  Exposures: lives at home with boyfriend 8 cats, 3 dogs. Outdoor.  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   Sexual activity: Not Currently    Relevant family history:  Family History  Problem Relation Age of Onset   Hypertension Father    Valvular heart disease Father    Heart Problems Sister    Diabetes Maternal Grandmother    Rheumatic fever Paternal  Uncle    Asthma Child    Colon cancer Neg Hx    Esophageal cancer Neg Hx    Pancreatic cancer Neg Hx    Stomach cancer Neg Hx     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   Paranoid schizophrenia ([place])    Substance abuse ([place]) Clean as of 2009   Crack cocaine    Past Surgical History:  Procedure Laterality Date   CESAREAN SECTION     FOOT SURGERY     INCISION AND DRAINAGE OF WOUND Right 05/22/2018   Procedure: IRRIGATION AND DEBRIDEMENT RIGHT ARM WITH REMOVAL FOREIGN BODY;  Surgeon: [name], MD;  Location: [facility];  Service: Orthopedics;  Laterality: Right;   TUBAL LIGATION  2000     Physical Exam: Blood pressure 130/84, pulse 85, resp. rate 18, height 5\' 3"  (1.6 m), weight 127 lb (57.6 kg), SpO2 96 %. Gen:      No acute distress, appears older than stated age ENT:  no nasal polyps, mucus  membranes moist Lungs:    diminished, end expiratory wheezes, no increased wob CV:         Regular rate and rhythm; no murmurs, rubs, or gallops.  No pedal edema Abd:      + bowel sounds; soft, non-tender; no distension MSK: no acute synovitis of DIP or PIP joints, no mechanics hands.  Skin:      Warm and dry; no rashes Neuro: normal speech, no focal facial asymmetry Psych: alert and oriented x3, normal mood and affect   Data Reviewed/Medical Decision Making:  Independent interpretation of tests: Imaging:  Review of patient's chest xray images 11/6 revealed no acute cardiopulmonary process. The patient's images have been independently reviewed by me.    PFTs: I have personally reviewed the patient's PFTs and none on file No flowsheet data found.  Labs:  Lab Results  Component Value Date   WBC 6.4 12/04/2020   HGB 11.3 (L) 12/04/2020   HCT 35.8 (L) 12/04/2020   MCV 82.7 12/04/2020    PLT 189 12/04/2020   Lab Results  Component Value Date   NA 137 12/04/2020   K 3.9 12/04/2020   CL 102 12/04/2020   CO2 28 12/04/2020      Immunization status:  Immunization History  Administered Date(s) Administered   Influenza,inj,Quad PF,6+ Mos 03/13/2021     I reviewed prior external note(s) from pcp, ed  I reviewed the result(s) of the labs and imaging as noted above.   I have ordered spirometry/feno   Assessment:  COPD, new diagnosis Tobacco use disorder Need for flu shot  Plan/Recommendations:  New diagnosis copd with progressive symptoms and exacerbations. Start breztri 2 puffs twice a day. Start prn albuterol. Flu shot today.  Smoking Cessation Counseling:  1. The patient is an everyday smoker and symptomatic due to the following condition copd 2. The patient is currently re-contemplative in quitting smoking. 3. I advised patient to quit smoking. 4. We identified patient specific barriers to change.  5. I personally spent 3 minutes counseling the patient regarding tobacco use disorder. 6. We discussed management of stress and anxiety to help with smoking cessation, when applicable. 7. We discussed nicotine replacement therapy, Wellbutrin, Chantix as possible options. 8. I advised setting a quit date. 9. Follow?up arranged with our office to continue ongoing discussions. 10.Resources given to patient including quit hotline.    We discussed disease management and progression at length today.    Return to Care: Return in about 2 months (around 05/14/2021).  Lenice Llamas, MD [facility] [place]  CC: Vevelyn Francois, NP

## 2021-03-17 ENCOUNTER — Other Ambulatory Visit: Payer: Self-pay

## 2021-03-20 ENCOUNTER — Other Ambulatory Visit: Payer: Self-pay

## 2021-03-20 ENCOUNTER — Ambulatory Visit (HOSPITAL_COMMUNITY)
Admission: EM | Admit: 2021-03-20 | Discharge: 2021-03-20 | Disposition: A | Discharge status: Home / Self Care | Payer: Medicaid Other | Attending: Internal Medicine | Admitting: Internal Medicine

## 2021-03-20 ENCOUNTER — Encounter (HOSPITAL_COMMUNITY): Payer: Self-pay | Admitting: Emergency Medicine

## 2021-03-20 DIAGNOSIS — M5431 Sciatica, right side: Secondary | ICD-10-CM

## 2021-03-20 DIAGNOSIS — L03115 Cellulitis of right lower limb: Secondary | ICD-10-CM

## 2021-03-20 MED ORDER — METHOCARBAMOL 500 MG PO TABS: 500.0000 mg | ORAL_TABLET | Freq: Every evening | ORAL | 0 refills | Status: AC | PRN

## 2021-03-20 MED ORDER — CEPHALEXIN 500 MG PO CAPS: 500.0000 mg | ORAL_CAPSULE | Freq: Three times a day (TID) | ORAL | 0 refills | Status: AC

## 2021-03-20 MED ORDER — KETOROLAC TROMETHAMINE 30 MG/ML IJ SOLN
INTRAMUSCULAR | Status: AC
  Filled 2021-03-20: qty 1

## 2021-03-20 MED ORDER — KETOROLAC TROMETHAMINE 30 MG/ML IJ SOLN
30.0000 mg | Freq: Once | INTRAMUSCULAR | Status: AC
  Administered 2021-03-20: 19:00:00 30 mg via INTRAMUSCULAR

## 2021-03-20 MED ORDER — IBUPROFEN 600 MG PO TABS: 600.0000 mg | ORAL_TABLET | Freq: Four times a day (QID) | ORAL | 0 refills | Status: DC | PRN

## 2021-03-20 NOTE — Discharge Instructions (Addendum)
Please take medications as prescribed Gentle range of motion exercises If symptoms worsen please return to urgent care to be reevaluated

## 2021-03-20 NOTE — ED Triage Notes (Signed)
Pt presents with right side back pain xs 1 month. States has gotten severe over the last couple days.
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
## 2021-03-23 ENCOUNTER — Telehealth (HOSPITAL_COMMUNITY): Payer: Self-pay | Admitting: Licensed Clinical Social Worker

## 2021-03-23 ENCOUNTER — Encounter (HOSPITAL_COMMUNITY): Payer: Self-pay

## 2021-03-23 ENCOUNTER — Ambulatory Visit (HOSPITAL_COMMUNITY): Payer: No Payment, Other | Admitting: Licensed Clinical Social Worker

## 2021-03-23 NOTE — Telephone Encounter (Signed)
LCSW sent links at 305 and at 310 with no response. LCSW f/u with a PC at 308 and left HIPAA compliant VM

## 2021-03-28 ENCOUNTER — Emergency Department (HOSPITAL_COMMUNITY): Payer: Self-pay

## 2021-03-28 ENCOUNTER — Encounter (HOSPITAL_COMMUNITY): Payer: Self-pay | Admitting: Emergency Medicine

## 2021-03-28 ENCOUNTER — Emergency Department (HOSPITAL_COMMUNITY)
Admission: EM | Admit: 2021-03-28 | Discharge: 2021-03-28 | Disposition: A | Discharge status: Home / Self Care | Payer: Self-pay | Attending: Emergency Medicine | Admitting: Emergency Medicine

## 2021-03-28 DIAGNOSIS — F17210 Nicotine dependence, cigarettes, uncomplicated: Secondary | ICD-10-CM | POA: Insufficient documentation

## 2021-03-28 DIAGNOSIS — G8929 Other chronic pain: Secondary | ICD-10-CM | POA: Insufficient documentation

## 2021-03-28 DIAGNOSIS — D649 Anemia, unspecified: Secondary | ICD-10-CM | POA: Insufficient documentation

## 2021-03-28 DIAGNOSIS — Z7951 Long term (current) use of inhaled steroids: Secondary | ICD-10-CM | POA: Insufficient documentation

## 2021-03-28 DIAGNOSIS — M5441 Lumbago with sciatica, right side: Secondary | ICD-10-CM | POA: Insufficient documentation

## 2021-03-28 DIAGNOSIS — J45909 Unspecified asthma, uncomplicated: Secondary | ICD-10-CM | POA: Insufficient documentation

## 2021-03-28 LAB — CBC WITH DIFFERENTIAL/PLATELET
Abs Immature Granulocytes: 0.02 10*3/uL (ref 0.00–0.07)
Basophils Absolute: 0 10*3/uL (ref 0.0–0.1)
Basophils Relative: 1 %
Eosinophils Absolute: 0.2 10*3/uL (ref 0.0–0.5)
Eosinophils Relative: 4 %
HCT: 34.6 % — ABNORMAL LOW (ref 36.0–46.0)
Hemoglobin: 10.9 g/dL — ABNORMAL LOW (ref 12.0–15.0)
Immature Granulocytes: 0 %
Lymphocytes Relative: 33 %
Lymphs Abs: 2 10*3/uL (ref 0.7–4.0)
MCH: 26.6 pg (ref 26.0–34.0)
MCHC: 31.5 g/dL (ref 30.0–36.0)
MCV: 84.4 fL (ref 80.0–100.0)
Monocytes Absolute: 0.5 10*3/uL (ref 0.1–1.0)
Monocytes Relative: 8 %
Neutro Abs: 3.4 10*3/uL (ref 1.7–7.7)
Neutrophils Relative %: 54 %
Platelets: 206 10*3/uL (ref 150–400)
RBC: 4.1 MIL/uL (ref 3.87–5.11)
RDW: 14.1 % (ref 11.5–15.5)
WBC: 6.2 10*3/uL (ref 4.0–10.5)
nRBC: 0 % (ref 0.0–0.2)

## 2021-03-28 LAB — URINALYSIS, ROUTINE W REFLEX MICROSCOPIC
Bilirubin Urine: NEGATIVE
Glucose, UA: NEGATIVE mg/dL
Hgb urine dipstick: NEGATIVE
Ketones, ur: NEGATIVE mg/dL
Leukocytes,Ua: NEGATIVE
Nitrite: NEGATIVE
Protein, ur: NEGATIVE mg/dL
Specific Gravity, Urine: 1.003 — ABNORMAL LOW (ref 1.005–1.030)
pH: 5 (ref 5.0–8.0)

## 2021-03-28 LAB — COMPREHENSIVE METABOLIC PANEL
ALT: 24 U/L (ref 0–44)
AST: 22 U/L (ref 15–41)
Albumin: 3.6 g/dL (ref 3.5–5.0)
Alkaline Phosphatase: 87 U/L (ref 38–126)
Anion gap: 7 (ref 5–15)
BUN: 10 mg/dL (ref 6–20)
CO2: 25 mmol/L (ref 22–32)
Calcium: 8.8 mg/dL — ABNORMAL LOW (ref 8.9–10.3)
Chloride: 105 mmol/L (ref 98–111)
Creatinine, Ser: 0.77 mg/dL (ref 0.44–1.00)
GFR, Estimated: >60 mL/min (ref >60)
Glucose, Bld: 116 mg/dL — ABNORMAL HIGH (ref 70–99)
Potassium: 3.8 mmol/L (ref 3.5–5.1)
Sodium: 137 mmol/L (ref 135–145)
Total Bilirubin: 0.5 mg/dL (ref 0.3–1.2)
Total Protein: 7.8 g/dL (ref 6.5–8.1)

## 2021-03-28 LAB — PREGNANCY, URINE: Preg Test, Ur: NEGATIVE

## 2021-03-28 IMAGING — MR MR LUMBAR SPINE WO/W CM
7 of 8 series · 32 of 48 positions shown · IV contrast (6 ML GADAVIST)
Comparison: None.

CLINICAL DATA: Low back pain. Right-sided low back pain radiating
down the right leg for 1 week.

EXAM:
MRI LUMBAR SPINE WITHOUT AND WITH CONTRAST
TECHNIQUE: Multiplanar and multiecho pulse sequences of the lumbar spine were
obtained without and with intravenous contrast.
CONTRAST:  6mL GADAVIST GADOBUTROL 1 MMOL/ML IV SOLN

[Series 5: T1 · sagittal · 4.0mm · 0.81mm/px · 3 of 17 slices shown (1 of 2)]
[im 1/17]
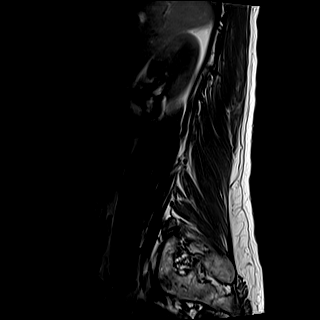
[im 9/17]
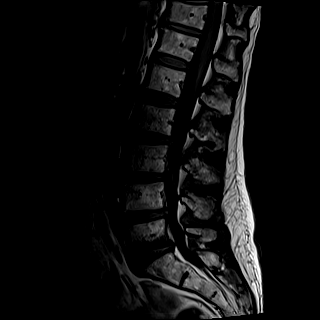
[im 17/17]
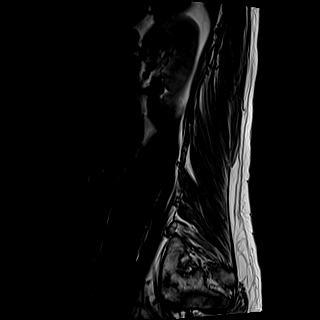

[Series 6: STIR · sagittal · 4.0mm · 0.51mm/px · 1 of 17 slices shown]
[im 1/17]
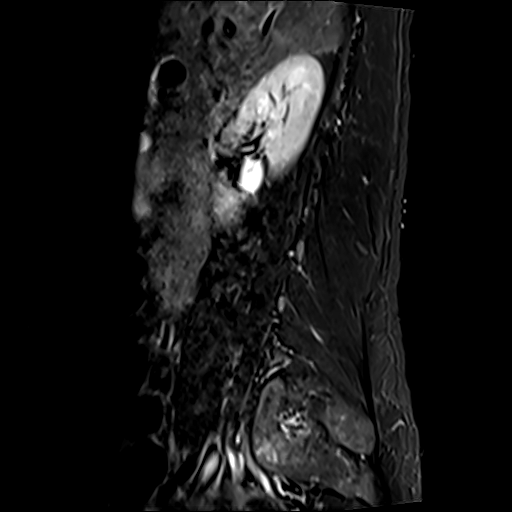

[Series 11: T2 · sagittal · 4.0mm · 0.81mm/px · 4 of 17 slices shown (1 of 2)]
[im 1/17]
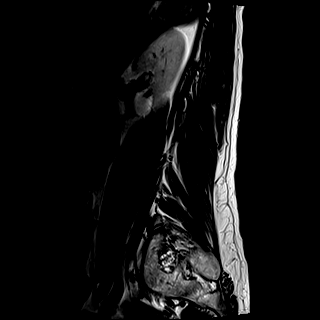
[im 6/17]
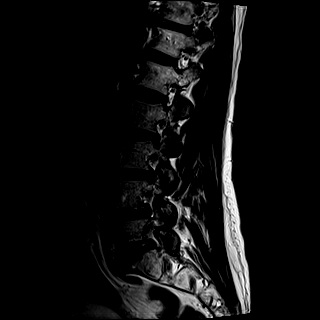
[im 11/17]
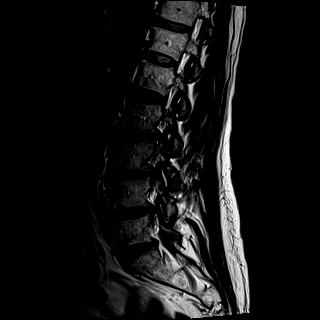
[im 17/17]
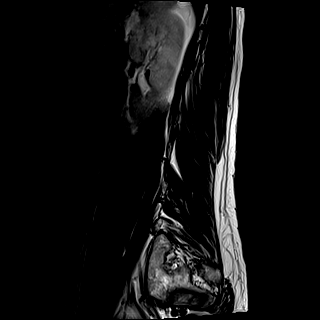

[Series 12: T2 · axial · 4.0mm · 0.62mm/px · z∈[-116,+117]mm · 8 of 43 slices shown (2 of 2)]
[im 1/43]
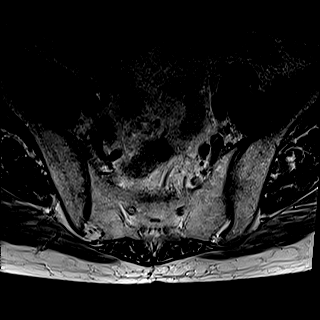
[im 5/43]
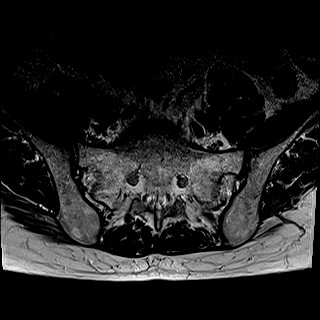
[im 15/43]
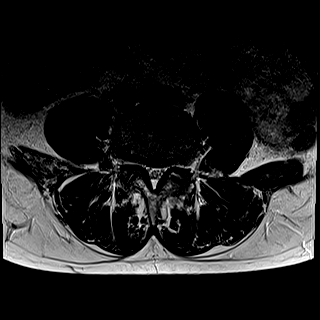
[im 19/43]
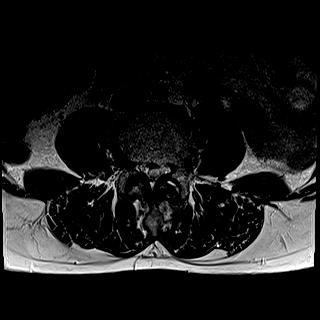
[im 24/43]
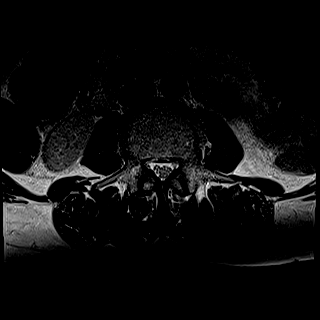
[im 29/43]
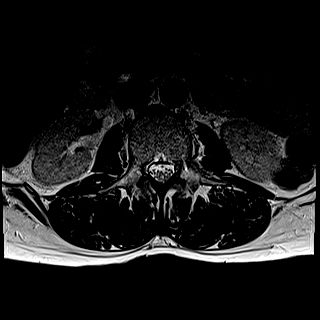
[im 38/43]
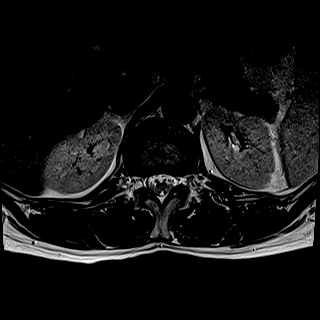
[im 43/43]
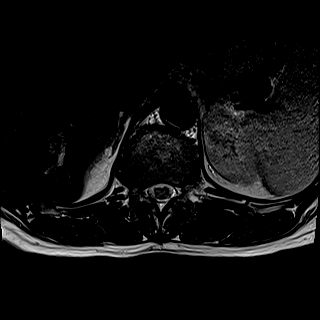

[Series 13: T1 · axial · 4.0mm · 0.39mm/px · z∈[-116,+117]mm · 8 of 43 slices shown (2 of 2)]
[im 1/43]
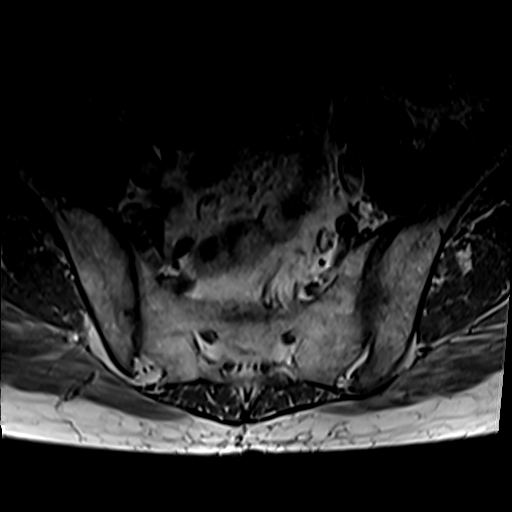
[im 5/43]
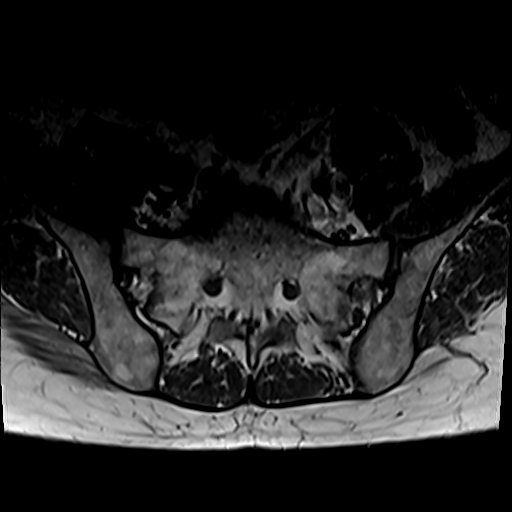
[im 15/43]
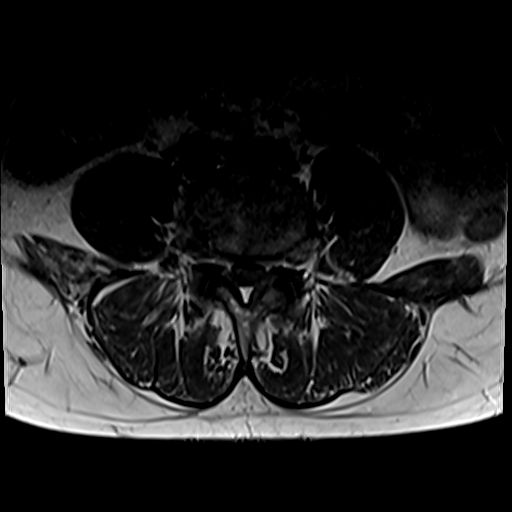
[im 19/43]
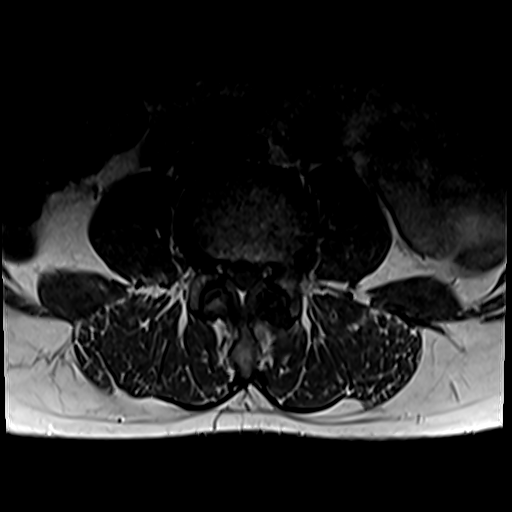
[im 24/43]
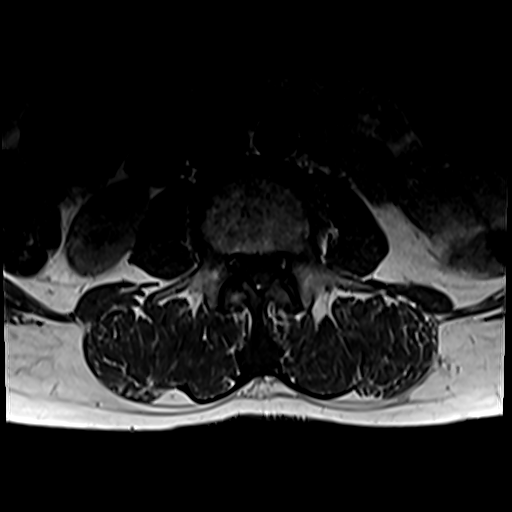
[im 29/43]
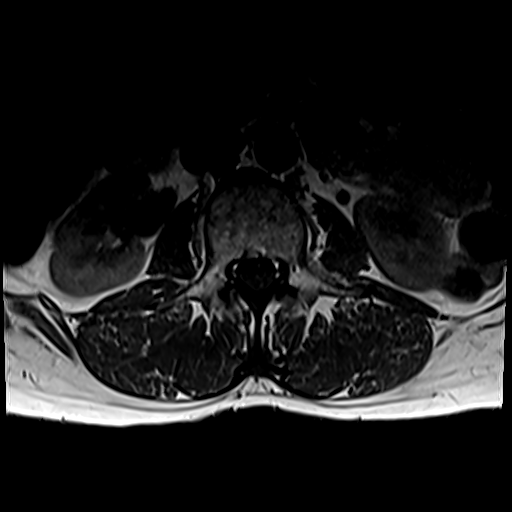
[im 38/43]
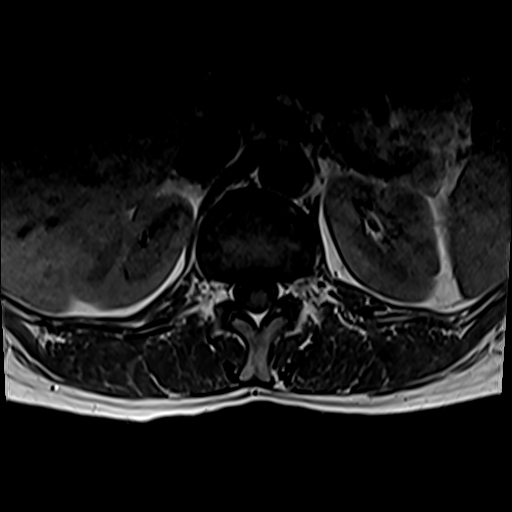
[im 43/43]
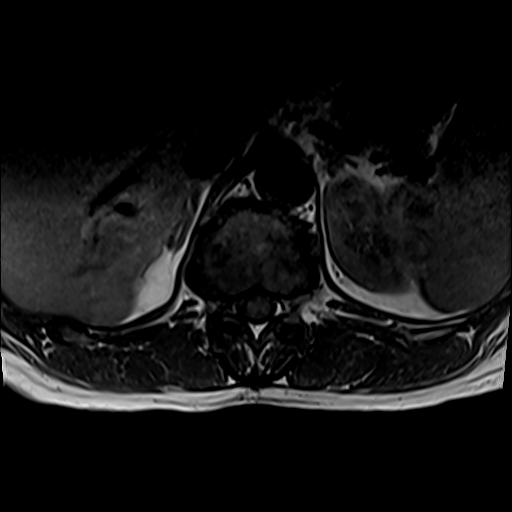

[Series 14: T2 post-contrast · sagittal · 4.0mm · 0.81mm/px · 4 of 17 slices shown]
[im 1/17]
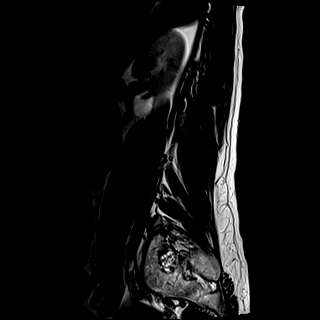
[im 6/17]
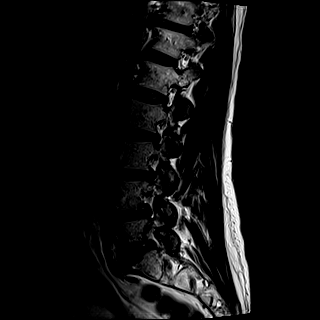
[im 11/17]
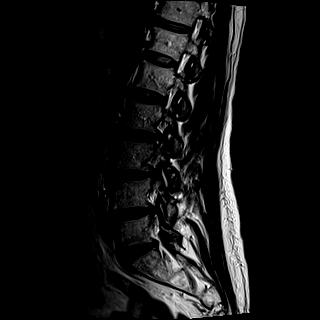
[im 17/17]
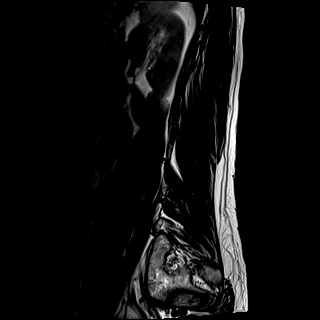

[Series 15: T1 fat-sat post-contrast · sagittal · 4.0mm · 0.81mm/px · 4 of 17 slices shown]
[im 1/17]
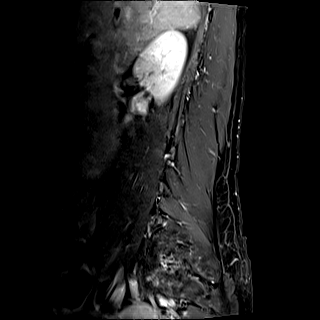
[im 6/17]
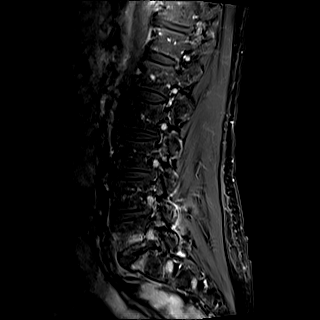
[im 11/17]
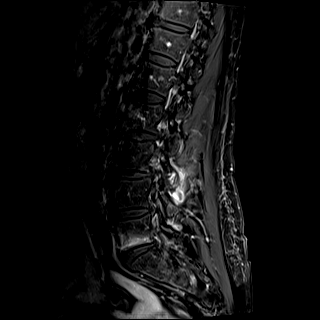
[im 17/17]
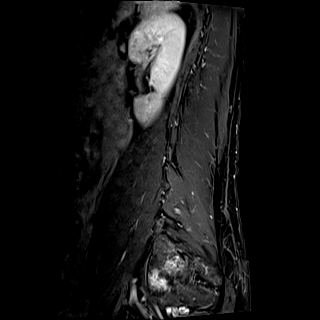

[32 of 48 positions shown; findings below may reference images not displayed]

FINDINGS: Segmentation:  Standard.

Alignment:  Physiologic.

Vertebrae: There is facet edema on the left at L3-4. Type 1 Modic
endplate signal changes at the inferior endplate of L5.

Conus medullaris and cauda equina: Conus extends to the L1 level.
Conus and cauda equina appear normal.

Paraspinal and other soft tissues: Negative

Disc levels:

L1-L2: Normal disc space and facet joints. No spinal canal stenosis.
No neural foraminal stenosis.

L2-L3: Small disc bulge, left asymmetric. No spinal canal stenosis.
No neural foraminal stenosis.

L3-L4: Left asymmetric disc bulge. Mild spinal canal stenosis.
Moderate left neural foraminal stenosis.

L4-L5: Small disc bulge. Left-greater-than-right lateral recess
narrowing without central spinal canal stenosis. No neural foraminal
stenosis.

L5-S1: Small disc bulge. No spinal canal stenosis. Mild right neural
foraminal stenosis.

Visualized sacrum: Normal.
IMPRESSION: 1. Facet edema and enhancement at left L3-4 and right L5-S1. There
are no specific findings of infection. This is favored to be a
degenerative finding.
2. No discitis-osteomyelitis.
3. Left asymmetric disc bulge at L3-L4 with moderate left neural
foraminal stenosis and mild spinal canal stenosis.
4. Left-greater-than-right lateral recess narrowing at L4-L5.
5. Mild right L5-S1 neural foraminal stenosis.

## 2021-03-28 MED ORDER — GADOBUTROL 1 MMOL/ML IV SOLN
6.0000 mL | Freq: Once | INTRAVENOUS | Status: AC | PRN
  Administered 2021-03-28: 21:00:00 6 mL via INTRAVENOUS

## 2021-03-28 MED ORDER — PREDNISONE 10 MG PO TABS: 20.0000 mg | ORAL_TABLET | Freq: Every day | ORAL | 0 refills | Status: AC

## 2021-03-28 MED ORDER — HYDROCODONE-ACETAMINOPHEN 5-325 MG PO TABS
1.0000 | ORAL_TABLET | Freq: Once | ORAL | Status: AC
  Administered 2021-03-28: 22:00:00 1 via ORAL
  Filled 2021-03-28: qty 1

## 2021-03-28 MED ORDER — OXYCODONE-ACETAMINOPHEN 5-325 MG PO TABS
1.0000 | ORAL_TABLET | Freq: Once | ORAL | Status: AC
  Administered 2021-03-28: 18:00:00 1 via ORAL
  Filled 2021-03-28: qty 1

## 2021-03-28 NOTE — ED Triage Notes (Signed)
Patient here from home reporting right sided lower back pain radiating down right leg x1 week.

## 2021-03-28 NOTE — Discharge Instructions (Addendum)
Your MRI does not show any infection.  Does show some evidence of arthritis.  Please use Tylenol or ibuprofen for pain.  You may use 600 mg ibuprofen every 6 hours or 1000 mg of Tylenol every 6 hours.  You may choose to alternate between the 2.  This would be most effective.  Not to exceed 4 g of Tylenol within 24 hours.  Not to exceed 3200 mg ibuprofen 24 hours.   Please take the prednisone as prescribed.  Please stop using cocaine.  Specifically please do not inject any medications into your veins.

## 2021-03-28 NOTE — ED Notes (Signed)
Attempted blood draw, x2 in L and R AC unsuccessful.
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
## 2021-03-28 NOTE — ED Provider Notes (Signed)
Emergency Medicine Provider Triage Evaluation Note  MERILEE WIBLE , a 55 y.o. female  was evaluated in triage.  Pt complains of right sided low back pain for >1 week. States it is severe constant and worsening. Was seen at [facility] and given muscle relaxants without relief. +IVDU. No weakness/numbness. No saddle anesthesia. No cancer hx or anticoagulants.   Review of Systems  Positive: Back pain Negative: Fevers  Physical Exam  BP (!) 136/98 (BP Location: Right Arm)    Temp 98.7 F (37.1 C) (Oral)    Resp 16    LMP  (LMP Unknown) Comment: approx 2 years    SpO2 99%  Gen:   Awake, uncomfortable   Resp:  Normal effort  MSK:   Moves extremities without difficulty Other:  No midline TTP or redness.   Medical Decision Making  Medically screening exam initiated at 3:49 PM.  Appropriate orders placed.  KAILANI BRASS was informed that the remainder of the evaluation will be completed by another provider, this initial triage assessment does not replace that evaluation, and the importance of remaining in the ED until their evaluation is complete.  Pt w very radicular sounding sypmtoms. No trauma. Does have hx of IVDU but only once recently (clean for 6 months).  Will obtain labs/urine.    Pati Gallo [place], [place] 03/28/21 1557    Sherwood Gambler, MD 03/28/21 [phone]

## 2021-03-29 ENCOUNTER — Telehealth: Payer: Self-pay

## 2021-03-29 NOTE — Telephone Encounter (Signed)
Telephoned patient at mobile number. Left voice message with [facility] contact information. 

## 2021-04-05 ENCOUNTER — Encounter (HOSPITAL_COMMUNITY): Payer: 59 | Admitting: Physician Assistant

## 2021-04-06 ENCOUNTER — Other Ambulatory Visit: Payer: Self-pay | Admitting: Nurse Practitioner

## 2021-04-07 ENCOUNTER — Other Ambulatory Visit: Payer: Self-pay

## 2021-04-07 ENCOUNTER — Other Ambulatory Visit: Payer: Self-pay | Admitting: Nurse Practitioner

## 2021-04-08 ENCOUNTER — Other Ambulatory Visit: Payer: Self-pay

## 2021-04-10 ENCOUNTER — Other Ambulatory Visit: Payer: Self-pay

## 2021-04-11 ENCOUNTER — Other Ambulatory Visit: Payer: Self-pay

## 2021-04-20 ENCOUNTER — Ambulatory Visit (INDEPENDENT_AMBULATORY_CARE_PROVIDER_SITE_OTHER): Payer: No Payment, Other | Admitting: Licensed Clinical Social Worker

## 2021-04-20 DIAGNOSIS — F259 Schizoaffective disorder, unspecified: Secondary | ICD-10-CM

## 2021-04-20 DIAGNOSIS — F4310 Post-traumatic stress disorder, unspecified: Secondary | ICD-10-CM

## 2021-04-20 NOTE — Progress Notes (Signed)
° °  THERAPIST PROGRESS NOTE  Virtual Visit via Video Note  I connected with Linda Cervantes on 04/20/21 at  3:00 PM EST by a video enabled telemedicine application and verified that I am speaking with the correct person using two identifiers.  Location: Patient: [facility]  Provider: Providers Home    I discussed the limitations of evaluation and management by telemedicine and the availability of in person appointments. The patient expressed understanding and agreed to proceed.      I discussed the assessment and treatment plan with the patient. The patient was provided an opportunity to ask questions and all were answered. The patient agreed with the plan and demonstrated an understanding of the instructions.   The patient was advised to call back or seek an in-person evaluation if the symptoms worsen or if the condition fails to improve as anticipated.  I provided 40 minutes of non-face-to-face time during this encounter.   Dory Horn, LCSW   Participation Level: Active  Behavioral Response: CasualAlertNegative, Anxious, and Depressed  Type of Therapy: Individual Therapy  Treatment Goals addressed: Anxiety and Diagnosis: schizoaffective disorder   Interventions: CBT and Supportive   Suicidal/Homicidal: Nowithout intent/plan  Therapist Response:    Pt was alert and oriented x 5. She was dressed casually and engaged well in therapy session. Linda Cervantes was pleasant, cooperative, and maintained good eye contact. She engaged well in therapy session and was dressed casually.   Primary stressor for pt is illness, financials, and relationship. Pt reports that through Christmas and most of new years she was very ill. She spent almost 1 week in bed which is why pt no showed for both counseling and medication management. LCSW did express the importance of canceling appointments moving forward instead of no showing. LCSW did schedule pt with a medication provider appointment   for that provider next available appointment. Pt reports that she has been stressed financially as she is not working and has not been working for about 8 weeks. Pt reports she is speaking with [employer] to help pt get connected with Medicaid and attempt to apply for social security. Linda Cervantes reports that her symptoms have increased but does states she stopped taking her medications for about two weeks and then restarted them about 3 weeks ago.   Interventions/Plan: LCSW administer a GAD-7 and PHQ-9 in todays session. Pt maxed out her GAD-7 at 21 and PHq-9 score was a 23. LCSW did educate pt on the importance of taking medications as prescribed without lapse. LCSW educated pt on the increase in symptoms could be caused by not taking her medication for 2 weeks and then re starting them. LCSW spoke with pt about AVH, flat mood/affect, and other negative symptoms. Plan for pt is to taking medications as prescribed. Pt to restart using narrative therapy in next session to speak about her trauma. Pt to f/u with resources provided to her about disability and Medicaid 1 x in the next 4 weeks.  Plan: Return again in 4 weeks.      Dory Horn, LCSW 04/20/2021

## 2021-04-24 ENCOUNTER — Other Ambulatory Visit: Payer: Self-pay

## 2021-04-24 ENCOUNTER — Encounter: Payer: Self-pay | Admitting: Nurse Practitioner

## 2021-04-24 ENCOUNTER — Ambulatory Visit (INDEPENDENT_AMBULATORY_CARE_PROVIDER_SITE_OTHER): Payer: 59 | Admitting: Nurse Practitioner

## 2021-04-24 ENCOUNTER — Ambulatory Visit (HOSPITAL_COMMUNITY)
Admission: RE | Admit: 2021-04-24 | Discharge: 2021-04-24 | Disposition: A | Discharge status: Home / Self Care | Payer: 59 | Source: Ambulatory Visit | Attending: Nurse Practitioner | Admitting: Nurse Practitioner

## 2021-04-24 VITALS — BP 142/76 | HR 79 | Temp 97.3°F | Ht 63.0 in | Wt 126.4 lb

## 2021-04-24 DIAGNOSIS — J441 Chronic obstructive pulmonary disease with (acute) exacerbation: Secondary | ICD-10-CM

## 2021-04-24 DIAGNOSIS — G894 Chronic pain syndrome: Secondary | ICD-10-CM

## 2021-04-24 DIAGNOSIS — M546 Pain in thoracic spine: Secondary | ICD-10-CM

## 2021-04-24 DIAGNOSIS — Z130 Encounter for screening for diseases of the blood and blood-forming organs and certain disorders involving the immune mechanism: Secondary | ICD-10-CM

## 2021-04-24 DIAGNOSIS — Z0000 Encounter for general adult medical examination without abnormal findings: Secondary | ICD-10-CM

## 2021-04-24 DIAGNOSIS — B182 Chronic viral hepatitis C: Secondary | ICD-10-CM

## 2021-04-24 DIAGNOSIS — K121 Other forms of stomatitis: Secondary | ICD-10-CM

## 2021-04-24 DIAGNOSIS — G8929 Other chronic pain: Secondary | ICD-10-CM | POA: Diagnosis present

## 2021-04-24 DIAGNOSIS — F259 Schizoaffective disorder, unspecified: Secondary | ICD-10-CM

## 2021-04-24 DIAGNOSIS — J309 Allergic rhinitis, unspecified: Secondary | ICD-10-CM

## 2021-04-24 DIAGNOSIS — R195 Other fecal abnormalities: Secondary | ICD-10-CM

## 2021-04-24 DIAGNOSIS — Z13220 Encounter for screening for lipoid disorders: Secondary | ICD-10-CM

## 2021-04-24 DIAGNOSIS — M79641 Pain in right hand: Secondary | ICD-10-CM

## 2021-04-24 DIAGNOSIS — F1193 Opioid use, unspecified with withdrawal: Secondary | ICD-10-CM

## 2021-04-24 LAB — POCT URINALYSIS DIP (CLINITEK)
Bilirubin, UA: NEGATIVE
Glucose, UA: NEGATIVE mg/dL
Ketones, POC UA: NEGATIVE mg/dL
Leukocytes, UA: NEGATIVE
Nitrite, UA: NEGATIVE
POC PROTEIN,UA: NEGATIVE
Spec Grav, UA: >=1.03 — AB (ref 1.010–1.025)
Urobilinogen, UA: 0.2 E.U./dL
pH, UA: 6 (ref 5.0–8.0)

## 2021-04-24 IMAGING — CR DG THORACIC SPINE 2V
3 series · 3 of 3 positions shown · non-contrast
Comparison: [DATE]

CLINICAL DATA: Upper back pain.

EXAM:
THORACIC SPINE 2 VIEWS

[w t-spine a.p.]
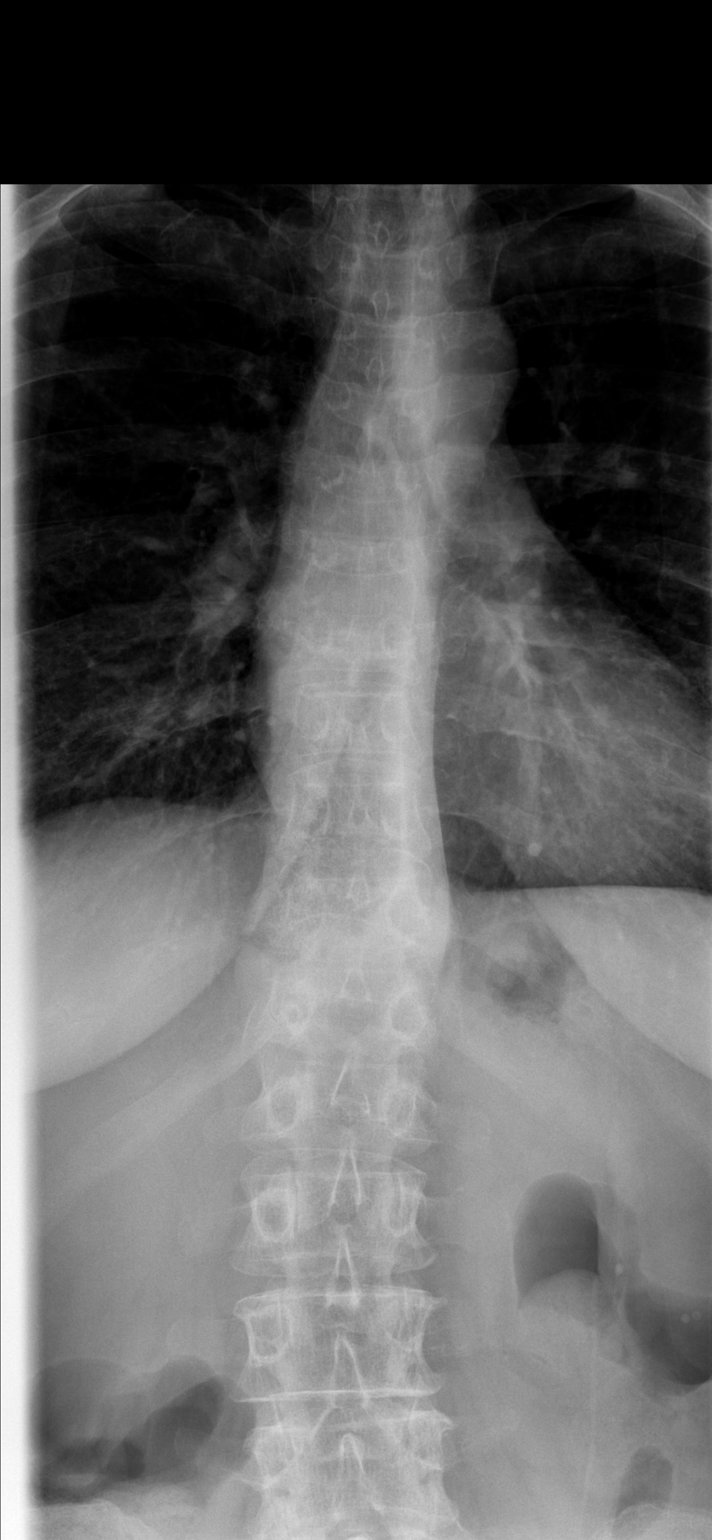

[w t-spine lat *]
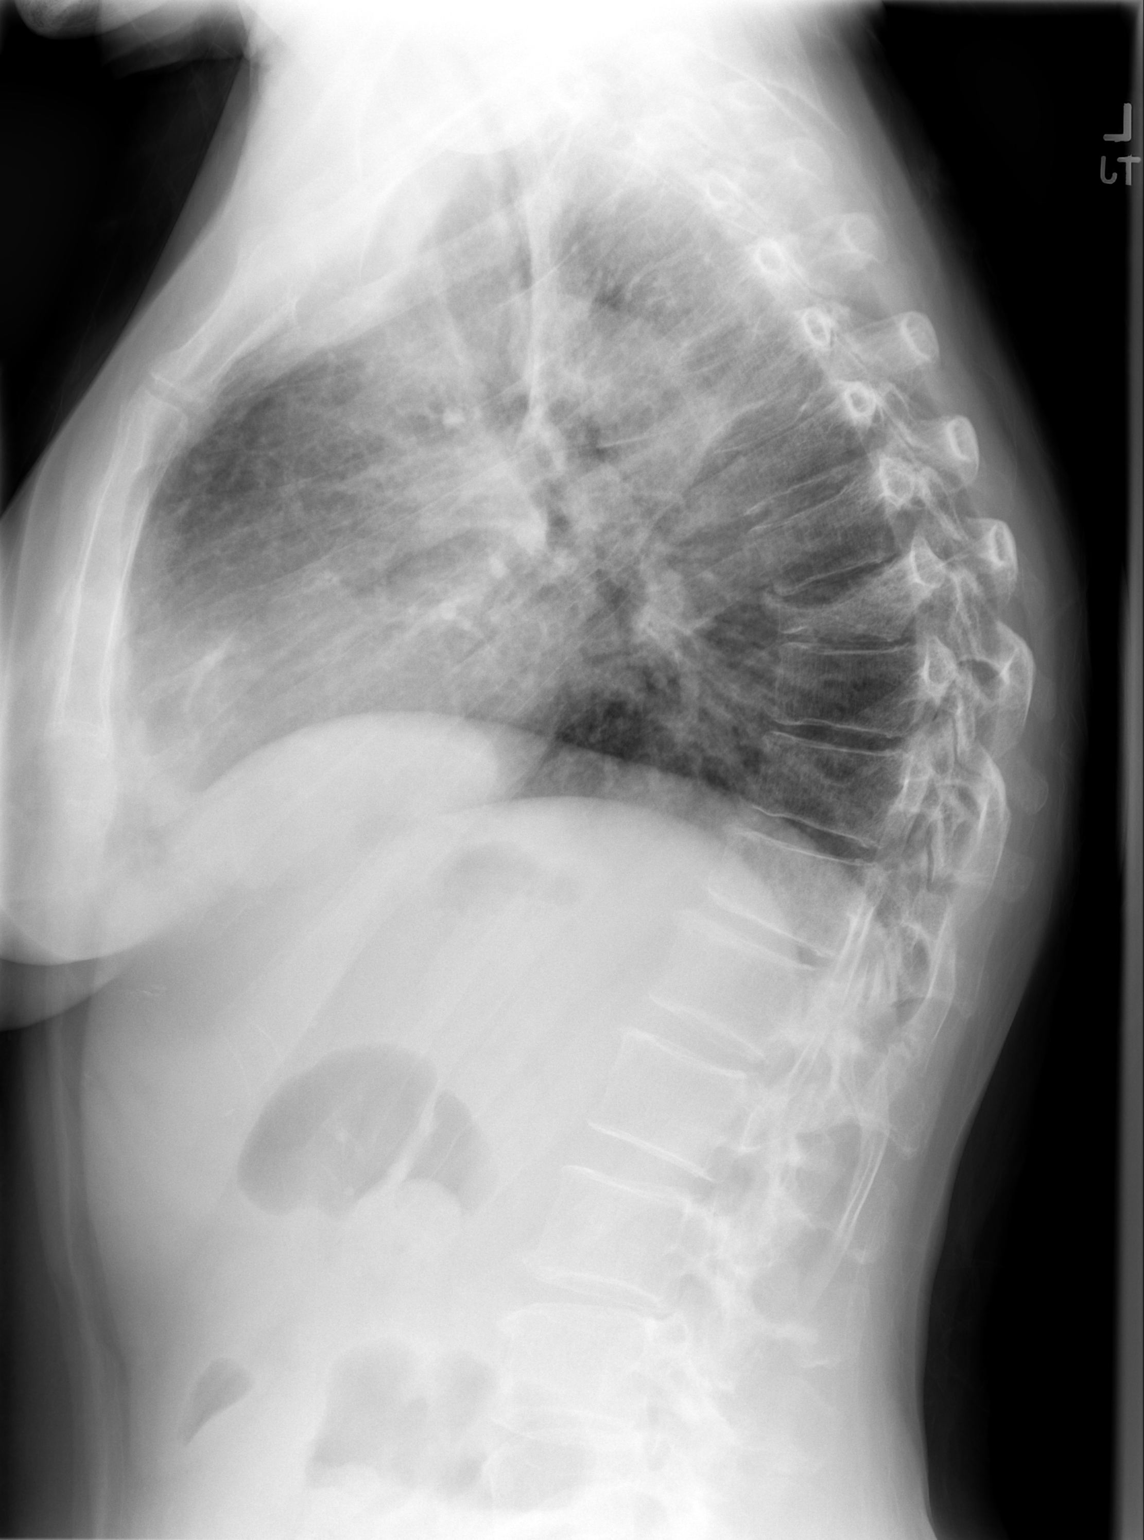

[w swimmers view]
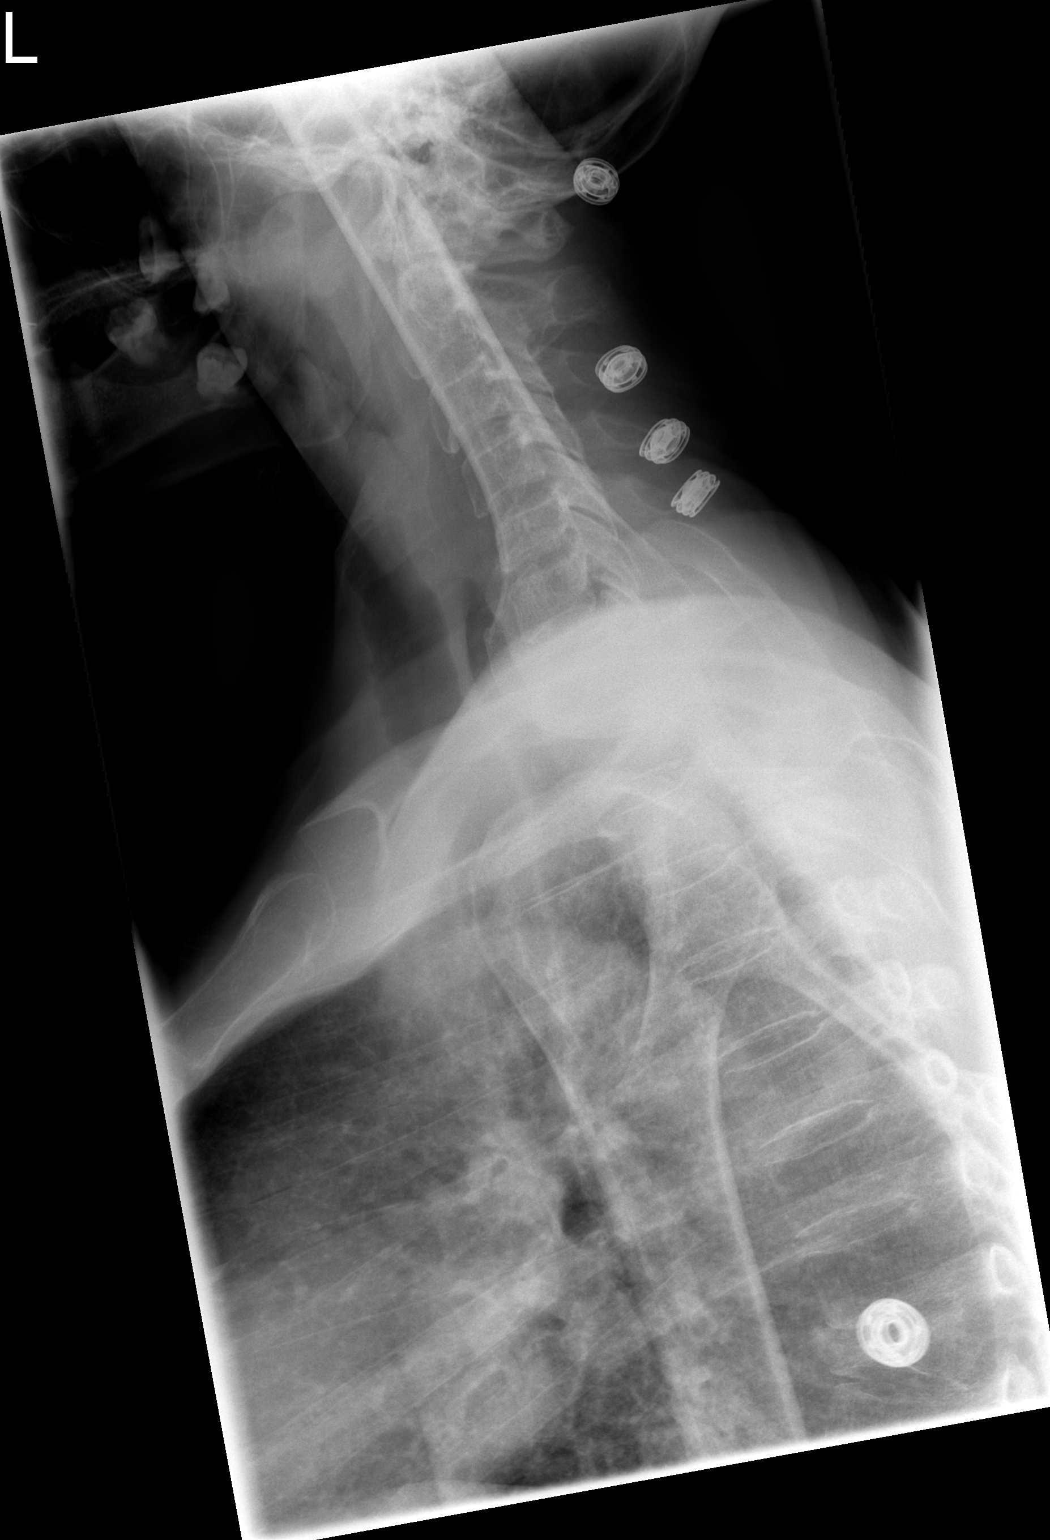

[3 of 3 positions shown; findings below may reference images not displayed]

FINDINGS: A compression fracture deformity of indeterminate age is seen at the
level of the T8 vertebral body. This represents a new finding when
compared to the prior study. A chronic eleventh right rib deformity
is seen. Alignment is normal. Mild multilevel endplate sclerosis is
noted throughout the thoracic spine. No other significant bone
abnormalities are identified.
IMPRESSION: 1. Compression fracture deformity of indeterminate age at the level
of the T8 vertebral body. MRI correlation is recommended.
2. Mild multilevel degenerative changes throughout the thoracic
spine.
3. Chronic right eleventh rib deformity.

## 2021-04-24 MED ORDER — LIDOCAINE VISCOUS HCL 2 % MT SOLN
15.0000 mL | Freq: Four times a day (QID) | OROMUCOSAL | 2 refills | Status: AC | PRN
  Filled 2021-04-24: qty 100, 2d supply, fill #0
  Filled 2021-05-19: qty 100, 2d supply, fill #1

## 2021-04-24 MED ORDER — IBUPROFEN 600 MG PO TABS
600.0000 mg | ORAL_TABLET | Freq: Four times a day (QID) | ORAL | 2 refills | Status: AC | PRN
  Filled 2021-04-24: qty 30, 8d supply, fill #0
  Filled 2021-05-19 – 2021-06-13 (×2): qty 30, 8d supply, fill #1

## 2021-04-24 MED ORDER — MOMETASONE FUROATE 50 MCG/ACT NA SUSP
2.0000 | Freq: Every day | NASAL | 0 refills | Status: DC
  Filled 2021-04-24: qty 17, 30d supply, fill #0

## 2021-04-24 NOTE — Patient Instructions (Signed)

## 2021-04-24 NOTE — Progress Notes (Signed)
[place], [place]  [id number] Phone:  [phone]   Fax:  [phone]   Established Patient Office Visit  Subjective:  Patient ID: Linda Cervantes, female    DOB: [birth year]  Age: 56 y.o. MRN: [id number]  CC:  Chief Complaint  Patient presents with   Follow-up    Pt is here today for her 6 month follow up visit. Pt states that she has been having back pains x 3 months.     HPI Linda Cervantes presents for follow up. 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   She is in today for follow-up.  She reports that she has been having back pain for 3 months.  The pain is in her middle back and it seems to swell.  She feels like this is getting worse.  She denies any recent injury.  She reports that she has a previous diagnosis of scoliosis.  She feels like she needs a referral to pain management for additional treatment options.  She is currently on ibuprofen 600 mg 3 times daily as needed.  She does not like several years ago is effective.  She feels like she may need a CT scan for further evaluation.  She is concern that she has worms.  She reports that 2 of her dogs and findings.  She was trying to be careful.  She feels like she is even seen a worm.  She is also concerned about a sore taste in her mouth.  She reports that she was seen several years ago for the same problem and it was deemed negative for cancer.  She is concerned because it has returned.  She is requesting some type of treatment.   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   Paranoid  schizophrenia ([place])    Substance abuse ([place]) Clean as of 2009   Crack cocaine    Past Surgical History:  Procedure Laterality Date   CESAREAN SECTION     FOOT SURGERY     INCISION AND DRAINAGE OF WOUND Right 05/22/2018   Procedure: IRRIGATION AND DEBRIDEMENT RIGHT ARM WITH REMOVAL FOREIGN BODY;  Surgeon: [name], MD;  Location: [facility];  Service: Orthopedics;  Laterality: Right;   TUBAL LIGATION  2000    Family History  Problem Relation Age of Onset   Hypertension Father    Valvular heart disease Father    Heart Problems Sister    Diabetes Maternal Grandmother    Rheumatic fever Paternal Uncle    Asthma Child    Colon cancer Neg Hx    Esophageal cancer Neg Hx    Pancreatic cancer Neg Hx    Stomach cancer Neg Hx     Social History   Socioeconomic History   Marital status: Legally Separated    Spouse name: Not on file   Number of children: Not on file   Years of education: Not on file   Highest education level: Not on file  Occupational History   Not on file  Tobacco Use   Smoking status:  Every Day    Packs/day: 1.00    Years: 40.00    Pack years: 40.00    Types: Cigarettes    Passive exposure: Current   Smokeless tobacco: Never  Vaping Use   Vaping Use: Never used  Substance and Sexual Activity   Alcohol use: No   Drug use: Not Currently    Types: Cocaine, Heroin, IV    Comment: 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   Sexual activity: Not Currently  Other Topics Concern   Not on file  Social History Narrative   Not on file   Social Determinants of Health   Financial Resource Strain: Medium Risk   Difficulty of Paying Living Expenses: Somewhat hard  Food Insecurity: No Food Insecurity   Worried About Running Out of Food in the Last Year: Never true   Ran Out of Food in the Last Year: Never true  Transportation Needs: No Transportation Needs   Lack of Transportation (Medical): No   Lack of Transportation  (Non-Medical): No  Physical Activity: Sufficiently Active   Days of Exercise per Week: 7 days   Minutes of Exercise per Session: 30 min  Stress: Stress Concern Present   Feeling of Stress : Very much  Social Connections: Socially Isolated   Frequency of Communication with Friends and Family: Once a week   Frequency of Social Gatherings with Friends and Family: Never   Attends Religious Services: Never   Marine scientist or Organizations: No   Attends Music therapist: Never   Marital Status: Separated  Intimate Partner Violence: Not At Risk   Fear of Current or Ex-Partner: No   Emotionally Abused: No   Physically Abused: No   Sexually Abused: No    Outpatient Medications Prior to Visit  Medication Sig Dispense Refill   albuterol (VENTOLIN HFA) 108 (90 Base) MCG/ACT inhaler Inhale 2 puffs into the lungs every 6 (six) hours as needed for wheezing (wheezing). 6.7 g 3   benzonatate (TESSALON) 100 MG capsule Take 1 capsule (100 mg total) by mouth 2 (two) times daily as needed for cough. 20 capsule 0   Budeson-Glycopyrrol-Formoterol (BREZTRI AEROSPHERE) 160-9-4.8 MCG/ACT AERO Inhale 2 puffs into the lungs in the morning and at bedtime. 32.1 g 3   cetirizine (ZYRTEC) 10 MG tablet Take 1 tablet (10 mg total) by mouth daily. 30 tablet 11   FLUoxetine (PROZAC) 10 MG capsule Take 1 capsule (10 mg total) by mouth daily. 30 capsule 1   gabapentin (NEURONTIN) 300 MG capsule Take 1 capsule (300 mg total) by mouth 3 (three) times daily. 90 capsule 3   meclizine (ANTIVERT) 25 MG tablet Take 1 tablet (25 mg total) by mouth 3 (three) times daily as needed for dizziness. 30 tablet 3   mirtazapine (REMERON) 7.5 MG tablet Take 1 tablet (7.5 mg total) by mouth at bedtime. 30 tablet 1   OLANZapine (ZYPREXA) 15 MG tablet Take 1 tablet (15 mg total) by mouth at bedtime. 30 tablet 3   ibuprofen (ADVIL) 600 MG tablet Take 1 tablet (600 mg total) by mouth every 6 (six) hours as needed. 30 tablet  0   mometasone (NASONEX) 50 MCG/ACT nasal spray Place 2 sprays into the nose daily. 17 g 0   ipratropium-albuterol (DUONEB) 0.5-2.5 (3) MG/3ML SOLN Take 3 mLs by nebulization every 2 (two) hours as needed (shortness of breath/ cough). (Patient not taking: Reported on 04/24/2021) 360 mL 0   METHADONE HCL PO Take 1 Dose by  mouth daily. (Patient not taking: Reported on 04/24/2021)     methocarbamol (ROBAXIN) 500 MG tablet Take 1 tablet (500 mg total) by mouth at bedtime as needed for muscle spasms. (Patient not taking: Reported on 04/24/2021) 15 tablet 0   Facility-Administered Medications Prior to Visit  Medication Dose Route Frequency Provider Last Rate Last Admin   0.9 %  sodium chloride infusion  500 mL Intravenous Once Nandigam, Kavitha V, MD        Allergies  Allergen Reactions   Amitriptyline Hcl Other (See Comments)    Face Swelling    ROS Review of Systems  HENT:  Positive for mouth sores.   Musculoskeletal:  Positive for back pain (thoracic back pain.).     Objective:    Physical Exam Constitutional:      Appearance: She is normal weight.  HENT:     Head: Normocephalic and atraumatic.     Nose: Nose normal.     Mouth/Throat:     Mouth: Mucous membranes are moist.     Comments: Right lower mucous oral ulcer  Cardiovascular:     Rate and Rhythm: Normal rate and regular rhythm.     Pulses: Normal pulses.     Heart sounds: Normal heart sounds.  Pulmonary:     Effort: Pulmonary effort is normal.     Breath sounds: Normal breath sounds.  Abdominal:     Palpations: Abdomen is soft.  Musculoskeletal:        General: Normal range of motion.     Thoracic back: Scoliosis present.  Skin:    General: Skin is warm.     Capillary Refill: Capillary refill takes less than 2 seconds.  Neurological:     General: No focal deficit present.     Mental Status: She is alert and oriented to person, place, and time.   BP (!) 142/76    Pulse 79    Temp (!) 97.3 F (36.3 C)    Ht _0   (1.6 m)    Wt 126 lb 6.4 oz (57.3 kg)    LMP  (LMP Unknown) Comment: approx 2 years    SpO2 98%    BMI 22.39 kg/m  Wt Readings from Last 3 Encounters:  04/24/21 126 lb 6.4 oz (57.3 kg)  03/13/21 127 lb (57.6 kg)  03/06/21 128 lb (58.1 kg)     There are no preventive care reminders to display for this patient.   There are no preventive care reminders to display for this patient.  Lab Results  Component Value Date   TSH 0.750 10/20/2020   Lab Results  Component Value Date   WBC 6.2 03/28/2021   HGB 10.9 (L) 03/28/2021   HCT 34.6 (L) 03/28/2021   MCV 84.4 03/28/2021   PLT 206 03/28/2021   Lab Results  Component Value Date   NA 137 03/28/2021   K 3.8 03/28/2021   CO2 25 03/28/2021   GLUCOSE 116 (H) 03/28/2021   BUN 10 03/28/2021   CREATININE 0.77 03/28/2021   BILITOT 0.5 03/28/2021   ALKPHOS 87 03/28/2021   AST 22 03/28/2021   ALT 24 03/28/2021   PROT 7.8 03/28/2021   ALBUMIN 3.6 03/28/2021   CALCIUM 8.8 (L) 03/28/2021   ANIONGAP 7 03/28/2021   EGFR 94 10/20/2020   Lab Results  Component Value Date   CHOL 184 10/20/2020   Lab Results  Component Value Date   HDL 42 10/20/2020   Lab Results  Component Value Date   LDLCALC  123 (H) 10/20/2020   Lab Results  Component Value Date   TRIG 103 10/20/2020   Lab Results  Component Value Date   CHOLHDL 4.4 10/20/2020   Lab Results  Component Value Date   HGBA1C 5.3 02/04/2018      Assessment & Plan:   Problem List Items Addressed This Visit       Respiratory   COPD with acute exacerbation ([place])    Relevant Medications   mometasone (NASONEX) 50 MCG/ACT nasal spray     Digestive   Chronic hepatitis C without hepatic coma ([place])   Relevant Orders   Comp. Metabolic Panel (12)     Other   NARCOTIC DEPENDENCE AND WITHDRAWAL (Chronic)   Pain in right hand   Schizoaffective disorder ([facility]) Persistent    Other Visit Diagnoses     Health care maintenance    -  Primary   Relevant Orders   POCT  URINALYSIS DIP (CLINITEK) (Completed)   Chronic left-sided thoracic back pain     Worsening    Relevant Medications   ibuprofen (ADVIL) 600 MG tablet   Other Relevant Orders   DG Thoracic Spine 2 View   Ambulatory referral to Pain Clinic   Chronic pain syndrome       Relevant Medications   ibuprofen (ADVIL) 600 MG tablet   Other Relevant Orders   DG Thoracic Spine 2 View   Ambulatory referral to Pain Clinic   Ulcer (traumatic) of oral mucosa     Persistent  Lidocaine rinse    Stool mucus       Relevant Orders   Cdiff NAA+O+P+Stool Culture   Screening for deficiency anemia       Relevant Orders   CBC with Differential/Platelet   Screening for cholesterol level       Relevant Orders   Lipid panel   Allergic rhinitis, unspecified seasonality, unspecified trigger       Relevant Medications   mometasone (NASONEX) 50 MCG/ACT nasal spray       Meds ordered this encounter  Medications   lidocaine (XYLOCAINE) 2 % solution    Sig: Use as directed 15 mLs in the mouth or throat every 6 (six) hours as needed for mouth pain.    Dispense:  100 mL    Refill:  2    Order Specific Question:   Supervising Provider    Answer:   Tresa Garter [[id number]]   mometasone (NASONEX) 50 MCG/ACT nasal spray    Sig: Place 2 sprays into the nose daily.    Dispense:  17 g    Refill:  0    Order Specific Question:   Supervising Provider    Answer:   Tresa Garter [[id number]]   ibuprofen (ADVIL) 600 MG tablet    Sig: Take 1 tablet (600 mg total) by mouth every 6 (six) hours as needed.    Dispense:  30 tablet    Refill:  2    Order Specific Question:   Supervising Provider    Answer:   Tresa Garter [id number]    Follow-up: Return in about 6 months (around 10/22/2021).    Vevelyn Francois, NP

## 2021-04-25 ENCOUNTER — Other Ambulatory Visit: Payer: Self-pay

## 2021-04-25 LAB — COMP. METABOLIC PANEL (12)
AST: 21 IU/L (ref 0–40)
Albumin/Globulin Ratio: 1.4 (ref 1.2–2.2)
Albumin: 4.3 g/dL (ref 3.8–4.9)
Alkaline Phosphatase: 118 IU/L (ref 44–121)
BUN/Creatinine Ratio: 12 (ref 9–23)
BUN: 10 mg/dL (ref 6–24)
Bilirubin Total: 0.3 mg/dL (ref 0.0–1.2)
Calcium: 9.7 mg/dL (ref 8.7–10.2)
Chloride: 100 mmol/L (ref 96–106)
Creatinine, Ser: 0.81 mg/dL (ref 0.57–1.00)
Globulin, Total: 3 g/dL (ref 1.5–4.5)
Glucose: 81 mg/dL (ref 70–99)
Potassium: 3.9 mmol/L (ref 3.5–5.2)
Sodium: 140 mmol/L (ref 134–144)
Total Protein: 7.3 g/dL (ref 6.0–8.5)
eGFR: 87 mL/min/{1.73_m2} (ref >59)

## 2021-04-25 LAB — CBC WITH DIFFERENTIAL/PLATELET
Basophils Absolute: 0 10*3/uL (ref 0.0–0.2)
Basos: 0 %
EOS (ABSOLUTE): 0.2 10*3/uL (ref 0.0–0.4)
Eos: 3 %
Hematocrit: 33 % — ABNORMAL LOW (ref 34.0–46.6)
Hemoglobin: 11 g/dL — ABNORMAL LOW (ref 11.1–15.9)
Immature Grans (Abs): 0 10*3/uL (ref 0.0–0.1)
Immature Granulocytes: 1 %
Lymphocytes Absolute: 2.2 10*3/uL (ref 0.7–3.1)
Lymphs: 35 %
MCH: 26.8 pg (ref 26.6–33.0)
MCHC: 33.3 g/dL (ref 31.5–35.7)
MCV: 81 fL (ref 79–97)
Monocytes Absolute: 0.7 10*3/uL (ref 0.1–0.9)
Monocytes: 11 %
Neutrophils Absolute: 3.2 10*3/uL (ref 1.4–7.0)
Neutrophils: 50 %
Platelets: 239 10*3/uL (ref 150–450)
RBC: 4.1 x10E6/uL (ref 3.77–5.28)
RDW: 13.4 % (ref 11.7–15.4)
WBC: 6.3 10*3/uL (ref 3.4–10.8)

## 2021-04-25 LAB — LIPID PANEL
Chol/HDL Ratio: 4.1 ratio (ref 0.0–4.4)
Cholesterol, Total: 220 mg/dL — ABNORMAL HIGH (ref 100–199)
HDL: 54 mg/dL (ref >39)
LDL Chol Calc (NIH): 152 mg/dL — ABNORMAL HIGH (ref 0–99)
Triglycerides: 78 mg/dL (ref 0–149)
VLDL Cholesterol Cal: 14 mg/dL (ref 5–40)

## 2021-04-26 ENCOUNTER — Other Ambulatory Visit: Payer: Self-pay

## 2021-04-27 ENCOUNTER — Telehealth: Payer: Self-pay

## 2021-04-28 ENCOUNTER — Other Ambulatory Visit: Payer: Self-pay

## 2021-04-28 ENCOUNTER — Other Ambulatory Visit: Payer: Self-pay | Admitting: Nurse Practitioner

## 2021-04-28 MED ORDER — FLUTICASONE PROPIONATE 50 MCG/ACT NA SUSP
1.0000 | Freq: Every day | NASAL | 0 refills | Status: AC
  Filled 2021-04-28: qty 16, 30d supply, fill #0

## 2021-04-28 NOTE — Telephone Encounter (Signed)
The refill has been sent. Thanks

## 2021-04-28 NOTE — Progress Notes (Signed)
   [facility] [address] [place], [place]  [address] Phone:  [phone]   Fax:  [phone] 

## 2021-05-02 LAB — CDIFF NAA+O+P+STOOL CULTURE
E coli, Shiga toxin Assay: NEGATIVE
Toxigenic C. Difficile by PCR: NEGATIVE

## 2021-05-04 ENCOUNTER — Other Ambulatory Visit: Payer: Self-pay

## 2021-05-19 ENCOUNTER — Other Ambulatory Visit (HOSPITAL_COMMUNITY): Payer: Self-pay | Admitting: Physician Assistant

## 2021-05-19 ENCOUNTER — Other Ambulatory Visit: Payer: Self-pay

## 2021-05-19 ENCOUNTER — Other Ambulatory Visit: Payer: Self-pay | Admitting: Nurse Practitioner

## 2021-05-19 DIAGNOSIS — J069 Acute upper respiratory infection, unspecified: Secondary | ICD-10-CM

## 2021-05-19 DIAGNOSIS — F411 Generalized anxiety disorder: Secondary | ICD-10-CM

## 2021-05-19 DIAGNOSIS — F4310 Post-traumatic stress disorder, unspecified: Secondary | ICD-10-CM

## 2021-05-19 DIAGNOSIS — G4700 Insomnia, unspecified: Secondary | ICD-10-CM

## 2021-05-19 DIAGNOSIS — R0602 Shortness of breath: Secondary | ICD-10-CM

## 2021-05-19 MED ORDER — IPRATROPIUM-ALBUTEROL 0.5-2.5 (3) MG/3ML IN SOLN
3.0000 mL | RESPIRATORY_TRACT | 0 refills | Status: AC | PRN
  Filled 2021-05-19 – 2021-06-13 (×2): qty 360, 10d supply, fill #0

## 2021-05-22 ENCOUNTER — Other Ambulatory Visit: Payer: Self-pay

## 2021-05-22 ENCOUNTER — Encounter (HOSPITAL_COMMUNITY): Payer: Self-pay | Admitting: Radiology

## 2021-05-22 MED ORDER — FLUOXETINE HCL 10 MG PO CAPS
10.0000 mg | ORAL_CAPSULE | Freq: Every day | ORAL | 1 refills | Status: DC
  Filled 2021-05-22: qty 30, 30d supply, fill #0

## 2021-05-22 MED ORDER — MIRTAZAPINE 7.5 MG PO TABS
7.5000 mg | ORAL_TABLET | Freq: Every day | ORAL | 1 refills | Status: DC
Start: 2021-05-22 — End: 2021-06-06
  Filled 2021-05-22: qty 30, 30d supply, fill #0

## 2021-05-25 ENCOUNTER — Telehealth (HOSPITAL_COMMUNITY): Payer: Self-pay | Admitting: Licensed Clinical Social Worker

## 2021-05-25 ENCOUNTER — Ambulatory Visit (INDEPENDENT_AMBULATORY_CARE_PROVIDER_SITE_OTHER): Payer: 59 | Admitting: Licensed Clinical Social Worker

## 2021-05-25 DIAGNOSIS — F251 Schizoaffective disorder, depressive type: Secondary | ICD-10-CM | POA: Diagnosis not present

## 2021-05-25 NOTE — Telephone Encounter (Signed)
I think [facility] perfect for her, if she is not already going there.

## 2021-05-25 NOTE — Telephone Encounter (Signed)
Pt reports sleeping medication is costing $30 and she was wondering if she could be switched to an alternative? She reports it is working well.  The medication Mirtazapine 7.5 mg.

## 2021-05-25 NOTE — Progress Notes (Signed)
° °  THERAPIST PROGRESS NOTE  Virtual Visit via Video Note  I connected with [employer] on 05/25/21 at  4:00 PM EST by a video enabled telemedicine application and verified that I am speaking with the correct person using two identifiers.  Location: Patient: [facility]  Provider: Providers Home    I discussed the limitations of evaluation and management by telemedicine and the availability of in person appointments. The patient expressed understanding and agreed to proceed.    I discussed the assessment and treatment plan with the patient. The patient was provided an opportunity to ask questions and all were answered. The patient agreed with the plan and demonstrated an understanding of the instructions.   The patient was advised to call back or seek an in-person evaluation if the symptoms worsen or if the condition fails to improve as anticipated.  I provided 40 minutes of non-face-to-face time during this encounter.   Dory Horn, LCSW   Participation Level: Active  Behavioral Response: CasualAlertAnxious and Depressed  Type of Therapy: Individual Therapy  Treatment Goals addressed:   ProgressTowards Goals: Progressing  Interventions: CBT and Supportive  Summary: DRINA JOBST is a 56 y.o. female who presents with   Suicidal/Homicidal: Nowithout intent/plan  Therapist Response:   Pt was alert and oriented x 5. She was dressed casually and engaged well in therapy session. She presented today with depressed, anxious, and irritable mood/affect. She was pleasant, cooperative, and maintained good eye contact   Pt reports primary stressor is illness. Pt reports that she broke her ribs several weeks ago. She was supposed to get a referral to pain management clinic. But Pam states that the referral was never made in her chart. She also reports that at the clinic she followed up with it was stated she had opioid withdrawal. Which pt denies any withdrawal. Pam states  she feels discriminated against because she has Hx of addiction and is currently on methadone. LCSW attempted to reframe her wording and states that doctors are attempting to look out for her best interest due to her history of addiction. Pt stated, I need help too LCSW encouraged pt to seek out alternative pain managements methods outside of opioids.   Interventions: LCSW administer a GAD-7 pt last scored a max score of 21, today she scored an 18. Pam decreased difficulty level from extremely difficult to Very Difficult. PHQ- 9 pt decreased score from 23 to 18. Pt also decreased her difficulty level from extremely difficult to very difficult. Results were reviewed with pt.   Plan: Pt to continue to use advocacy while maintaining her sobriety. Pt to continue to help with falling asleep by reading or mediating 4/7 nights of the week  Plan: Return again in 4 weeks.  Diagnosis: No diagnosis found.  Collaboration of Care: Other none in today's session   Patient/Guardian was advised Release of Information must be obtained prior to any record release in order to collaborate their care with an outside provider. Patient/Guardian was advised if they have not already done so to contact the registration department to sign all necessary forms in order for [place] to release information regarding their care.   Consent: Patient/Guardian gives verbal consent for treatment and assignment of benefits for services provided during this visit. Patient/Guardian expressed understanding and agreed to proceed.   Dory Horn, LCSW 05/25/2021

## 2021-05-29 ENCOUNTER — Other Ambulatory Visit: Payer: Self-pay

## 2021-06-06 ENCOUNTER — Ambulatory Visit (INDEPENDENT_AMBULATORY_CARE_PROVIDER_SITE_OTHER): Payer: 59 | Admitting: Physician Assistant

## 2021-06-06 ENCOUNTER — Other Ambulatory Visit: Payer: Self-pay

## 2021-06-06 ENCOUNTER — Encounter (HOSPITAL_COMMUNITY): Payer: Self-pay | Admitting: Physician Assistant

## 2021-06-06 DIAGNOSIS — F411 Generalized anxiety disorder: Secondary | ICD-10-CM

## 2021-06-06 DIAGNOSIS — F259 Schizoaffective disorder, unspecified: Secondary | ICD-10-CM

## 2021-06-06 DIAGNOSIS — F4310 Post-traumatic stress disorder, unspecified: Secondary | ICD-10-CM

## 2021-06-06 DIAGNOSIS — G4700 Insomnia, unspecified: Secondary | ICD-10-CM | POA: Diagnosis not present

## 2021-06-06 MED ORDER — MIRTAZAPINE 7.5 MG PO TABS: 7.5000 mg | ORAL_TABLET | Freq: Every day | ORAL | 3 refills | Status: AC

## 2021-06-06 MED ORDER — OLANZAPINE 20 MG PO TABS: 20.0000 mg | ORAL_TABLET | Freq: Every day | ORAL | 3 refills | Status: AC

## 2021-06-06 MED ORDER — FLUOXETINE HCL 20 MG PO CAPS: 20.0000 mg | ORAL_CAPSULE | Freq: Every day | ORAL | 3 refills | Status: AC

## 2021-06-06 NOTE — Progress Notes (Signed)
[facility] MD/PA/NP OP Progress Note  06/06/2021 10:38 PM Linda Cervantes  MRN:  [id number]  Chief Complaint:  Chief Complaint  Patient presents with   Weight Loss    F/U MM   HPI:   Linda Cervantes is a 56 year old female with a past psychiatric history significant for schizoaffective disorder (unspecified type), PTSD, insomnia, and generalized anxiety disorder who presents to [facility] for follow-up and medication management.  Patient is currently being managed on the following medications:  Olanzapine 15 mg at bedtime Mirtazapine 7.5 mg at bedtime Fluoxetine 10 mg daily  Patient reports that her medication has been working well and has not had much issues.  She reports that she still continues to see and hear things but her experiences have not been as impactful since being placed back on her medication.  Patient describes her auditory hallucinations as hearing murmurings that are hard to make out.  Her visual hallucinations are characterized by seeing shadows or movements at the corner of her eyes.  Patient still endorses depression and states that it has worsened some since losing her job.  Patient endorses anxiety and rates her anxiety an 8 out of 10.  She describes herself as feeling tense as if she knows that something bad is going to happen.  When her anxiety is at its worst, she feels like she is going to climb the walls.  She reports that her boyfriend is tired of hearing her moan and groan about her symptoms.  Patient denies any new stressors at this time.  A PHQ-9 screen was performed with the patient scoring a 21.  A GAD-7 screen was also performed with the patient scoring a 21.  Patient is alert and oriented x4, calm, cooperative, and fully engaged in conversation during the encounter.  Patient endorses nervous mood and states that she feels like she is in a bubble.  Patient denies suicidal or homicidal ideations.  She further denies auditory or  visual hallucinations and does not appear to be responding to internal/external stimuli.  Patient states that she sleeps all the time.  Patient endorses fair appetite and states that she snacks during the day.  Patient denies alcohol consumption, tobacco use, and illicit drug use.  Visit Diagnosis:    ICD-10-CM   1. Schizoaffective disorder, unspecified type ([place])  F25.9 OLANZapine (ZYPREXA) 20 MG tablet    2. Post traumatic stress disorder (PTSD)  F43.10 mirtazapine (REMERON) 7.5 MG tablet    FLUoxetine (PROZAC) 20 MG capsule    3. Insomnia, unspecified type  G47.00 mirtazapine (REMERON) 7.5 MG tablet    4. 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   Crack cocaine    Past Surgical History:  Procedure Laterality Date   CESAREAN SECTION     FOOT SURGERY     INCISION AND DRAINAGE OF WOUND Right 05/22/2018   Procedure: IRRIGATION AND DEBRIDEMENT RIGHT ARM WITH REMOVAL FOREIGN BODY;  Surgeon: [name], MD;  Location: [facility];  Service: Orthopedics;  Laterality: Right;   TUBAL LIGATION  2000    Family Psychiatric History:  Father - Schizoaffective disorder  Family History:  Family History  Problem Relation Age of Onset   Hypertension Father    Valvular heart disease Father    Heart Problems Sister    Diabetes Maternal Grandmother    Rheumatic fever Paternal Uncle    Asthma Child    Colon cancer Neg Hx    Esophageal cancer Neg Hx    Pancreatic cancer Neg Hx     Stomach cancer Neg Hx     Social History:  Social History   Socioeconomic History   Marital status: Legally Separated    Spouse name: Not on file   Number of children: Not on file   Years of education: Not on file   Highest education level: Not on file  Occupational History   Not on file  Tobacco Use   Smoking status: Every Day    Packs/day: 1.00    Years: 40.00    Pack years: 40.00    Types: Cigarettes    Passive exposure: Current   Smokeless tobacco: Never  Vaping Use   Vaping Use: Never used  Substance and Sexual Activity   Alcohol use: No   Drug use: Not Currently    Types: Cocaine, Heroin, IV    Comment: 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   Sexual activity: Not Currently  Other Topics Concern   Not on file  Social History Narrative   Not on file   Social Determinants of Health   Financial Resource Strain: Medium Risk   Difficulty of Paying Living Expenses: Somewhat hard  Food Insecurity: No Food Insecurity   Worried About Running Out of Food in the Last Year: Never true   Ran Out of Food in the Last Year: Never true  Transportation Needs: No Transportation Needs   Lack of Transportation (Medical): No   Lack of Transportation (Non-Medical): No  Physical Activity: Sufficiently Active   Days of Exercise per Week: 7 days   Minutes of Exercise per Session: 30 min  Stress: Stress Concern Present   Feeling of Stress : Very much  Social Connections: Socially Isolated   Frequency of Communication with Friends and Family: Once a week   Frequency of Social Gatherings with Friends and Family: Never   Attends Religious Services: Never   Marine scientist or Organizations: No   Attends Archivist Meetings: Never   Marital Status: Separated    Allergies:  Allergies  Allergen Reactions   Amitriptyline Hcl Other (See Comments)    Face Swelling    Metabolic Disorder Labs: Lab Results  Component Value Date   HGBA1C 5.3  02/04/2018   No results found for: PROLACTIN Lab Results  Component Value Date   CHOL 220 (H) 04/24/2021   TRIG 78 04/24/2021   HDL 54 04/24/2021   CHOLHDL 4.1 04/24/2021   VLDL 30 10/03/2010   LDLCALC 152 (H) 04/24/2021   LDLCALC 123 (H) 10/20/2020   Lab Results  Component Value Date   TSH 0.750 10/20/2020   TSH 2.180 02/04/2018    Therapeutic Level Labs: No results found for: LITHIUM No results found for: VALPROATE No components found for:  CBMZ  Current Medications: Current Outpatient Medications  Medication Sig Dispense Refill   albuterol (VENTOLIN HFA) 108 (90 Base) MCG/ACT inhaler Inhale 2 puffs into the  lungs every 6 (six) hours as needed for wheezing (wheezing). 6.7 g 3   benzonatate (TESSALON) 100 MG capsule Take 1 capsule (100 mg total) by mouth 2 (two) times daily as needed for cough. 20 capsule 0   Budeson-Glycopyrrol-Formoterol (BREZTRI AEROSPHERE) 160-9-4.8 MCG/ACT AERO Inhale 2 puffs into the lungs in the morning and at bedtime. 32.1 g 3   cetirizine (ZYRTEC) 10 MG tablet Take 1 tablet (10 mg total) by mouth daily. 30 tablet 11   FLUoxetine (PROZAC) 20 MG capsule Take 1 capsule (20 mg total) by mouth daily. 30 capsule 3   fluticasone (FLONASE) 50 MCG/ACT nasal spray Place 1 spray into both nostrils daily. 16 g 0   gabapentin (NEURONTIN) 300 MG capsule Take 1 capsule (300 mg total) by mouth 3 (three) times daily. 90 capsule 3   ibuprofen (ADVIL) 600 MG tablet Take 1 tablet (600 mg total) by mouth every 6 (six) hours as needed. 30 tablet 2   ipratropium-albuterol (DUONEB) 0.5-2.5 (3) MG/3ML SOLN Take 3 mLs by nebulization every 2 (two) hours as needed (shortness of breath/ cough). 360 mL 0   lidocaine (XYLOCAINE) 2 % solution Use as directed 15 mLs in the mouth or throat every 6 (six) hours as needed for mouth pain. 100 mL 2   meclizine (ANTIVERT) 25 MG tablet Take 1 tablet (25 mg total) by mouth 3 (three) times daily as needed for dizziness. 30 tablet 3   METHADONE  HCL PO Take 1 Dose by mouth daily.     methocarbamol (ROBAXIN) 500 MG tablet Take 1 tablet (500 mg total) by mouth at bedtime as needed for muscle spasms. 15 tablet 0   mirtazapine (REMERON) 7.5 MG tablet Take 1 tablet (7.5 mg total) by mouth at bedtime. 30 tablet 3   OLANZapine (ZYPREXA) 20 MG tablet Take 1 tablet (20 mg total) by mouth at bedtime. 30 tablet 3   Current Facility-Administered Medications  Medication Dose Route Frequency Provider Last Rate Last Admin   0.9 %  sodium chloride infusion  500 mL Intravenous Once Nandigam, Venia Minks, MD         Musculoskeletal: Strength & Muscle Tone: within normal limits Gait & Station: normal Patient leans: [name]  Psychiatric Specialty Exam: Review of Systems  Psychiatric/Behavioral:  Positive for sleep disturbance. Negative for decreased concentration, dysphoric mood, hallucinations, self-injury and suicidal ideas. The patient is nervous/anxious. The patient is not hyperactive.    Blood pressure 133/90, pulse 81, height '5\' 3"'$  (1.6 m), weight 121 lb (54.9 kg).Body mass index is 21.43 kg/m.  General Appearance: Casual  Eye Contact:  Good  Speech:  Clear and Coherent and Normal Rate  Volume:  Normal  Mood:  Anxious and Depressed  Affect:  Congruent  Thought Process:  Coherent, Goal Directed, and Descriptions of Associations: Intact  Orientation:  Full (Time, Place, and Person)  Thought Content: WDL   Suicidal Thoughts:  No  Homicidal Thoughts:  No  Memory:  Immediate;   Good Recent;   Good Remote;   Good  Judgement:  Fair  Insight:  Fair  Psychomotor Activity:  Restlessness  Concentration:  Concentration: Good and Attention Span: Fair  Recall:  Good  Fund of Knowledge: Fair  Language: Good  Akathisia:  No  Handed:  Right  AIMS (if indicated): not done  Assets:  Communication Skills Desire for Improvement Housing Vocational/Educational  ADL's:  Intact  Cognition: WNL  Sleep:  Fair   Screenings: GAD-7    Museum/gallery conservator Visit from  06/06/2021 in [facility] Counselor from 05/25/2021 in [facility] Counselor from 04/20/2021 in [facility] Counselor from 03/02/2021 in [facility] Office Visit from 02/16/2021 in [facility]  Total GAD-7 Score '21 18 21 16 21      '$ PHQ2-9    [place] Office Visit from 06/06/2021 in [facility] Counselor from 05/25/2021 in [facility] Office Visit from 04/24/2021 in [place] Counselor from 04/20/2021 in [facility] Office Visit from 03/06/2021 in [place]  PHQ-2 Total Score '6 5 6 6 1  '$ PHQ-9 Total Score '21 18 21 23 '$ --      [place] Office Visit from 06/06/2021 in [facility] ED from 03/28/2021 in [place] DEPT ED from 03/20/2021 in [facility] [place] No Risk Error: Question 6 not populated        Assessment and Plan:   Linda Cervantes is a 56 year old female with a past psychiatric history significant for schizoaffective disorder (unspecified type), PTSD, insomnia, and generalized anxiety disorder who presents to [facility] for follow-up and medication management.  Patient endorses some management of her symptoms but states that she still continues to experience auditory/visual hallucinations to a lesser degree.  Patient also endorses depressive symptoms attributed to losing her job recently as well as anxiety.  Provider recommended increasing her dosage of olanzapine from 15 mg to 20 mg at bedtime for the management of her schizoaffective disorder.  Patient was also recommended increasing her dosage of fluoxetine from 10 mg to 20 mg daily for the management  of her depressive symptoms and anxiety.  Patient was agreeable to recommendations.  Patient's medications to be e-prescribed to pharmacy of choice.  Collaboration of Care: Collaboration of Care: Medication Management AEB provider managing patient's psychiatric medications, Primary Care Provider AEB being followed by a family medicine provider, Psychiatrist AEB patient being followed by this mental health provider, and Referral or follow-up with counselor/therapist AEB by patient being seen by a licensed clinical social worker at this facility.  Patient/Guardian was advised Release of Information must be obtained prior to any record release in order to collaborate their care with an outside provider. Patient/Guardian was advised if they have not already done so to contact the registration department to sign all necessary forms in order for [place] to release information regarding their care.   Consent: Patient/Guardian gives verbal consent for treatment and assignment of benefits for services provided during this visit. Patient/Guardian expressed understanding and agreed to proceed.   1. Schizoaffective disorder, unspecified type ([place])  - OLANZapine (ZYPREXA) 20 MG tablet; Take 1 tablet (20 mg total) by mouth at bedtime.  Dispense: 30 tablet; Refill: 3  2. Post traumatic stress disorder (PTSD)  - mirtazapine (REMERON) 7.5 MG tablet; Take 1 tablet (7.5 mg total) by mouth at bedtime.  Dispense: 30 tablet; Refill: 3 - FLUoxetine (PROZAC) 20 MG capsule; Take 1 capsule (20 mg total) by mouth daily.  Dispense: 30 capsule; Refill: 3  3. Insomnia, unspecified type  - mirtazapine (REMERON) 7.5 MG tablet; Take 1 tablet (7.5 mg total) by mouth at bedtime.  Dispense: 30 tablet; Refill: 3  4. Generalized anxiety disorder  - mirtazapine (REMERON) 7.5 MG tablet; Take 1 tablet (7.5 mg total) by mouth  at bedtime.  Dispense: 30 tablet; Refill: 3 - FLUoxetine (PROZAC) 20 MG capsule; Take 1 capsule (20 mg total) by  mouth daily.  Dispense: 30 capsule; Refill: 3  Patient to follow up in 2 months Provider spent a total of 18 minutes with the patient/reviewing patient's chart  Malachy Mood, PA 06/06/2021, 10:38 PM

## 2021-06-13 ENCOUNTER — Other Ambulatory Visit: Payer: Self-pay

## 2021-06-15 ENCOUNTER — Ambulatory Visit (INDEPENDENT_AMBULATORY_CARE_PROVIDER_SITE_OTHER): Payer: 59 | Admitting: Licensed Clinical Social Worker

## 2021-06-15 DIAGNOSIS — F251 Schizoaffective disorder, depressive type: Secondary | ICD-10-CM | POA: Diagnosis not present

## 2021-06-15 DIAGNOSIS — F4310 Post-traumatic stress disorder, unspecified: Secondary | ICD-10-CM

## 2021-06-15 NOTE — Progress Notes (Signed)
? ?  THERAPIST PROGRESS NOTE ?Virtual Visit via Video Note ? ?I connected with SHADAI MCCLANE on 06/15/21 at  3:00 PM EDT by a video enabled telemedicine application and verified that I am speaking with the correct person using two identifiers. ? ?Location: ?Patient: [facility]  ?Provider: Providers Home  ?  ?I discussed the limitations of evaluation and management by telemedicine and the availability of in person appointments. The patient expressed understanding and agreed to proceed. ? ?  ?I discussed the assessment and treatment plan with the patient. The patient was provided an opportunity to ask questions and all were answered. The patient agreed with the plan and demonstrated an understanding of the instructions. ?  ?The patient was advised to call back or seek an in-person evaluation if the symptoms worsen or if the condition fails to improve as anticipated. ? ?I provided 30 minutes of non-face-to-face time during this encounter. ? ? ?Dory Horn, LCSW  ? ?Participation Level: Active ? ?Behavioral Response: CasualAlertAnxious and Depressed ? ?Type of Therapy: Individual Therapy ? ?Treatment Goals addressed: STG: [facility] WILL COMPLETE AT LEAST 80% OF ASSIGNED  ?HOMEWORK ?Zoom ? ? ?ProgressTowards Goals: Progressing ? ?Interventions: CBT, Motivational Interviewing, and Supportive ? ? ?Suicidal/Homicidal: Nowithout intent/plan ? ?Therapist Response:  ? ? \Pt was alert and oriented x 5. She was dressed casually and engaged well in therapy session. Pt presented with anxious and restless mood/affect. She was pleasant, cooperative, and maintained good eye contact.  ? Pt came into session late after LCSW f/u with a PC to remind her. LCSW stated to pt this is the second time a phone call had to be mad during appt and it will not happen beyond today getting PC same day as reminder. Pt stated she understood. She was in a car during assessment/follow up. Her partner was driving. Pt endorses symptoms for  paranoia, AH, and VH. She denies SI and HI. Pt signal was lost 30 minutes into session. Note was completed based on what LCSW and pt spoke about prior to bad connection. Pt reports primary stressor as mental health. She endorses symptoms list above. LCSW advised pt to call [facility] and contact nursing line to advocate for her symptoms. Pt was agreeable. Pt reports other stressors are her back but reports she cannot get pain medications because she is on methadone.  ? Interventions/Plan: LCSW worked on goal on attending 80 percent of session. Pt remain above 80 percent. PHQ-9 could not be administered due to per connection. LCSW used purposeful therapy to provide pt with resources on how to advocate for medication management due to her increase in psychosis. Plan for pt is to attend session in 4 weeks. No new appt could be made due to poor connection. Pt to f/u with medication provider 1 x in next 4 weeks about increase in psychosis  ? ?Plan: Return again in 4 weeks. ? ?Diagnosis: No diagnosis found. ? ?Collaboration of Care: Other none in today's session. ? ?Patient/Guardian was advised Release of Information must be obtained prior to any record release in order to collaborate their care with an outside provider. Patient/Guardian was advised if they have not already done so to contact the registration department to sign all necessary forms in order for [place] to release information regarding their care.  ? ?Consent: Patient/Guardian gives verbal consent for treatment and assignment of benefits for services provided during this visit. Patient/Guardian expressed understanding and agreed to proceed.  ? ?Dory Horn, LCSW ?06/15/2021 ? ?

## 2021-07-06 ENCOUNTER — Emergency Department (HOSPITAL_COMMUNITY): Payer: 59

## 2021-07-06 ENCOUNTER — Inpatient Hospital Stay (HOSPITAL_COMMUNITY)
Admission: EM | Admit: 2021-07-06 | Discharge: 2021-07-31 | DRG: 853 | Disposition: E | Payer: 59 | Attending: Internal Medicine | Admitting: Internal Medicine

## 2021-07-06 ENCOUNTER — Other Ambulatory Visit: Payer: Self-pay

## 2021-07-06 DIAGNOSIS — Z515 Encounter for palliative care: Secondary | ICD-10-CM

## 2021-07-06 DIAGNOSIS — I10 Essential (primary) hypertension: Secondary | ICD-10-CM | POA: Diagnosis present

## 2021-07-06 DIAGNOSIS — G928 Other toxic encephalopathy: Secondary | ICD-10-CM | POA: Diagnosis present

## 2021-07-06 DIAGNOSIS — R339 Retention of urine, unspecified: Secondary | ICD-10-CM | POA: Diagnosis present

## 2021-07-06 DIAGNOSIS — F17210 Nicotine dependence, cigarettes, uncomplicated: Secondary | ICD-10-CM | POA: Diagnosis present

## 2021-07-06 DIAGNOSIS — G9340 Encephalopathy, unspecified: Secondary | ICD-10-CM | POA: Diagnosis present

## 2021-07-06 DIAGNOSIS — G061 Intraspinal abscess and granuloma: Secondary | ICD-10-CM | POA: Diagnosis present

## 2021-07-06 DIAGNOSIS — Z781 Physical restraint status: Secondary | ICD-10-CM

## 2021-07-06 DIAGNOSIS — J9811 Atelectasis: Secondary | ICD-10-CM | POA: Diagnosis not present

## 2021-07-06 DIAGNOSIS — R451 Restlessness and agitation: Secondary | ICD-10-CM | POA: Diagnosis not present

## 2021-07-06 DIAGNOSIS — G9341 Metabolic encephalopathy: Secondary | ICD-10-CM | POA: Diagnosis not present

## 2021-07-06 DIAGNOSIS — E876 Hypokalemia: Secondary | ICD-10-CM | POA: Diagnosis present

## 2021-07-06 DIAGNOSIS — J9601 Acute respiratory failure with hypoxia: Secondary | ICD-10-CM | POA: Diagnosis not present

## 2021-07-06 DIAGNOSIS — F1190 Opioid use, unspecified, uncomplicated: Secondary | ICD-10-CM | POA: Diagnosis present

## 2021-07-06 DIAGNOSIS — R296 Repeated falls: Secondary | ICD-10-CM | POA: Diagnosis present

## 2021-07-06 DIAGNOSIS — F1490 Cocaine use, unspecified, uncomplicated: Secondary | ICD-10-CM | POA: Diagnosis present

## 2021-07-06 DIAGNOSIS — Z20822 Contact with and (suspected) exposure to covid-19: Secondary | ICD-10-CM | POA: Diagnosis present

## 2021-07-06 DIAGNOSIS — A4102 Sepsis due to Methicillin resistant Staphylococcus aureus: Secondary | ICD-10-CM | POA: Diagnosis not present

## 2021-07-06 DIAGNOSIS — E871 Hypo-osmolality and hyponatremia: Secondary | ICD-10-CM | POA: Diagnosis present

## 2021-07-06 DIAGNOSIS — Z888 Allergy status to other drugs, medicaments and biological substances status: Secondary | ICD-10-CM

## 2021-07-06 DIAGNOSIS — B182 Chronic viral hepatitis C: Secondary | ICD-10-CM | POA: Diagnosis present

## 2021-07-06 DIAGNOSIS — D509 Iron deficiency anemia, unspecified: Secondary | ICD-10-CM | POA: Diagnosis present

## 2021-07-06 DIAGNOSIS — Z79899 Other long term (current) drug therapy: Secondary | ICD-10-CM

## 2021-07-06 DIAGNOSIS — R578 Other shock: Secondary | ICD-10-CM | POA: Diagnosis not present

## 2021-07-06 DIAGNOSIS — F32A Depression, unspecified: Secondary | ICD-10-CM | POA: Diagnosis present

## 2021-07-06 DIAGNOSIS — Z8249 Family history of ischemic heart disease and other diseases of the circulatory system: Secondary | ICD-10-CM

## 2021-07-06 DIAGNOSIS — R338 Other retention of urine: Secondary | ICD-10-CM

## 2021-07-06 DIAGNOSIS — F200 Paranoid schizophrenia: Secondary | ICD-10-CM | POA: Diagnosis present

## 2021-07-06 DIAGNOSIS — M549 Dorsalgia, unspecified: Secondary | ICD-10-CM

## 2021-07-06 DIAGNOSIS — M48061 Spinal stenosis, lumbar region without neurogenic claudication: Secondary | ICD-10-CM | POA: Diagnosis present

## 2021-07-06 DIAGNOSIS — D72829 Elevated white blood cell count, unspecified: Secondary | ICD-10-CM

## 2021-07-06 DIAGNOSIS — G062 Extradural and subdural abscess, unspecified: Secondary | ICD-10-CM | POA: Diagnosis not present

## 2021-07-06 DIAGNOSIS — F1910 Other psychoactive substance abuse, uncomplicated: Secondary | ICD-10-CM | POA: Diagnosis present

## 2021-07-06 DIAGNOSIS — G9529 Other cord compression: Secondary | ICD-10-CM | POA: Diagnosis present

## 2021-07-06 DIAGNOSIS — J449 Chronic obstructive pulmonary disease, unspecified: Secondary | ICD-10-CM | POA: Diagnosis present

## 2021-07-06 DIAGNOSIS — Z66 Do not resuscitate: Secondary | ICD-10-CM | POA: Diagnosis not present

## 2021-07-06 DIAGNOSIS — G8389 Other specified paralytic syndromes: Secondary | ICD-10-CM | POA: Diagnosis not present

## 2021-07-06 DIAGNOSIS — K219 Gastro-esophageal reflux disease without esophagitis: Secondary | ICD-10-CM | POA: Diagnosis present

## 2021-07-06 DIAGNOSIS — A419 Sepsis, unspecified organism: Secondary | ICD-10-CM | POA: Diagnosis present

## 2021-07-06 LAB — RAPID URINE DRUG SCREEN, HOSP PERFORMED
Amphetamines: NOT DETECTED
Barbiturates: NOT DETECTED
Benzodiazepines: NOT DETECTED
Cocaine: POSITIVE — AB
Opiates: NOT DETECTED
Tetrahydrocannabinol: NOT DETECTED

## 2021-07-06 LAB — CBC WITH DIFFERENTIAL/PLATELET
Abs Immature Granulocytes: 0.12 10*3/uL — ABNORMAL HIGH (ref 0.00–0.07)
Basophils Absolute: 0 10*3/uL (ref 0.0–0.1)
Basophils Relative: 0 %
Eosinophils Absolute: 0 10*3/uL (ref 0.0–0.5)
Eosinophils Relative: 0 %
HCT: 40.4 % (ref 36.0–46.0)
Hemoglobin: 13.7 g/dL (ref 12.0–15.0)
Immature Granulocytes: 1 %
Lymphocytes Relative: 6 %
Lymphs Abs: 1.2 10*3/uL (ref 0.7–4.0)
MCH: 26.6 pg (ref 26.0–34.0)
MCHC: 33.9 g/dL (ref 30.0–36.0)
MCV: 78.3 fL — ABNORMAL LOW (ref 80.0–100.0)
Monocytes Absolute: 2 10*3/uL — ABNORMAL HIGH (ref 0.1–1.0)
Monocytes Relative: 10 %
Neutro Abs: 17.2 10*3/uL — ABNORMAL HIGH (ref 1.7–7.7)
Neutrophils Relative %: 83 %
Platelets: 235 10*3/uL (ref 150–400)
RBC: 5.16 MIL/uL — ABNORMAL HIGH (ref 3.87–5.11)
RDW: 13.9 % (ref 11.5–15.5)
WBC: 20.6 10*3/uL — ABNORMAL HIGH (ref 4.0–10.5)
nRBC: 0 % (ref 0.0–0.2)

## 2021-07-06 LAB — COMPREHENSIVE METABOLIC PANEL
ALT: 53 U/L — ABNORMAL HIGH (ref 0–44)
AST: 53 U/L — ABNORMAL HIGH (ref 15–41)
Albumin: 4.1 g/dL (ref 3.5–5.0)
Alkaline Phosphatase: 85 U/L (ref 38–126)
Anion gap: 15 (ref 5–15)
BUN: 21 mg/dL — ABNORMAL HIGH (ref 6–20)
CO2: 22 mmol/L (ref 22–32)
Calcium: 9.8 mg/dL (ref 8.9–10.3)
Chloride: 93 mmol/L — ABNORMAL LOW (ref 98–111)
Creatinine, Ser: 0.67 mg/dL (ref 0.44–1.00)
GFR, Estimated: >60 mL/min (ref >60)
Glucose, Bld: 119 mg/dL — ABNORMAL HIGH (ref 70–99)
Potassium: 3.2 mmol/L — ABNORMAL LOW (ref 3.5–5.1)
Sodium: 130 mmol/L — ABNORMAL LOW (ref 135–145)
Total Bilirubin: 1.1 mg/dL (ref 0.3–1.2)
Total Protein: 9 g/dL — ABNORMAL HIGH (ref 6.5–8.1)

## 2021-07-06 LAB — URINALYSIS, ROUTINE W REFLEX MICROSCOPIC
Bilirubin Urine: NEGATIVE
Glucose, UA: NEGATIVE mg/dL
Ketones, ur: 20 mg/dL — AB
Leukocytes,Ua: NEGATIVE
Nitrite: NEGATIVE
Protein, ur: 100 mg/dL — AB
Specific Gravity, Urine: 1.016 (ref 1.005–1.030)
pH: 6 (ref 5.0–8.0)

## 2021-07-06 LAB — RESP PANEL BY RT-PCR (FLU A&B, COVID) ARPGX2
Influenza A by PCR: NEGATIVE
Influenza B by PCR: NEGATIVE
SARS Coronavirus 2 by RT PCR: NEGATIVE

## 2021-07-06 LAB — LIPASE, BLOOD: Lipase: 27 U/L (ref 11–51)

## 2021-07-06 LAB — LACTIC ACID, PLASMA: Lactic Acid, Venous: 1.9 mmol/L (ref 0.5–1.9)

## 2021-07-06 LAB — ETHANOL: Alcohol, Ethyl (B): <10 mg/dL (ref <10)

## 2021-07-06 LAB — PREGNANCY, URINE: Preg Test, Ur: NEGATIVE

## 2021-07-06 IMAGING — DX DG CHEST 1V PORT
1 series · 1 of 1 positions shown · non-contrast
Comparison: [DATE]

CLINICAL DATA: History of drug use, possible sepsis

EXAM:
PORTABLE CHEST 1 VIEW

[chest ap]
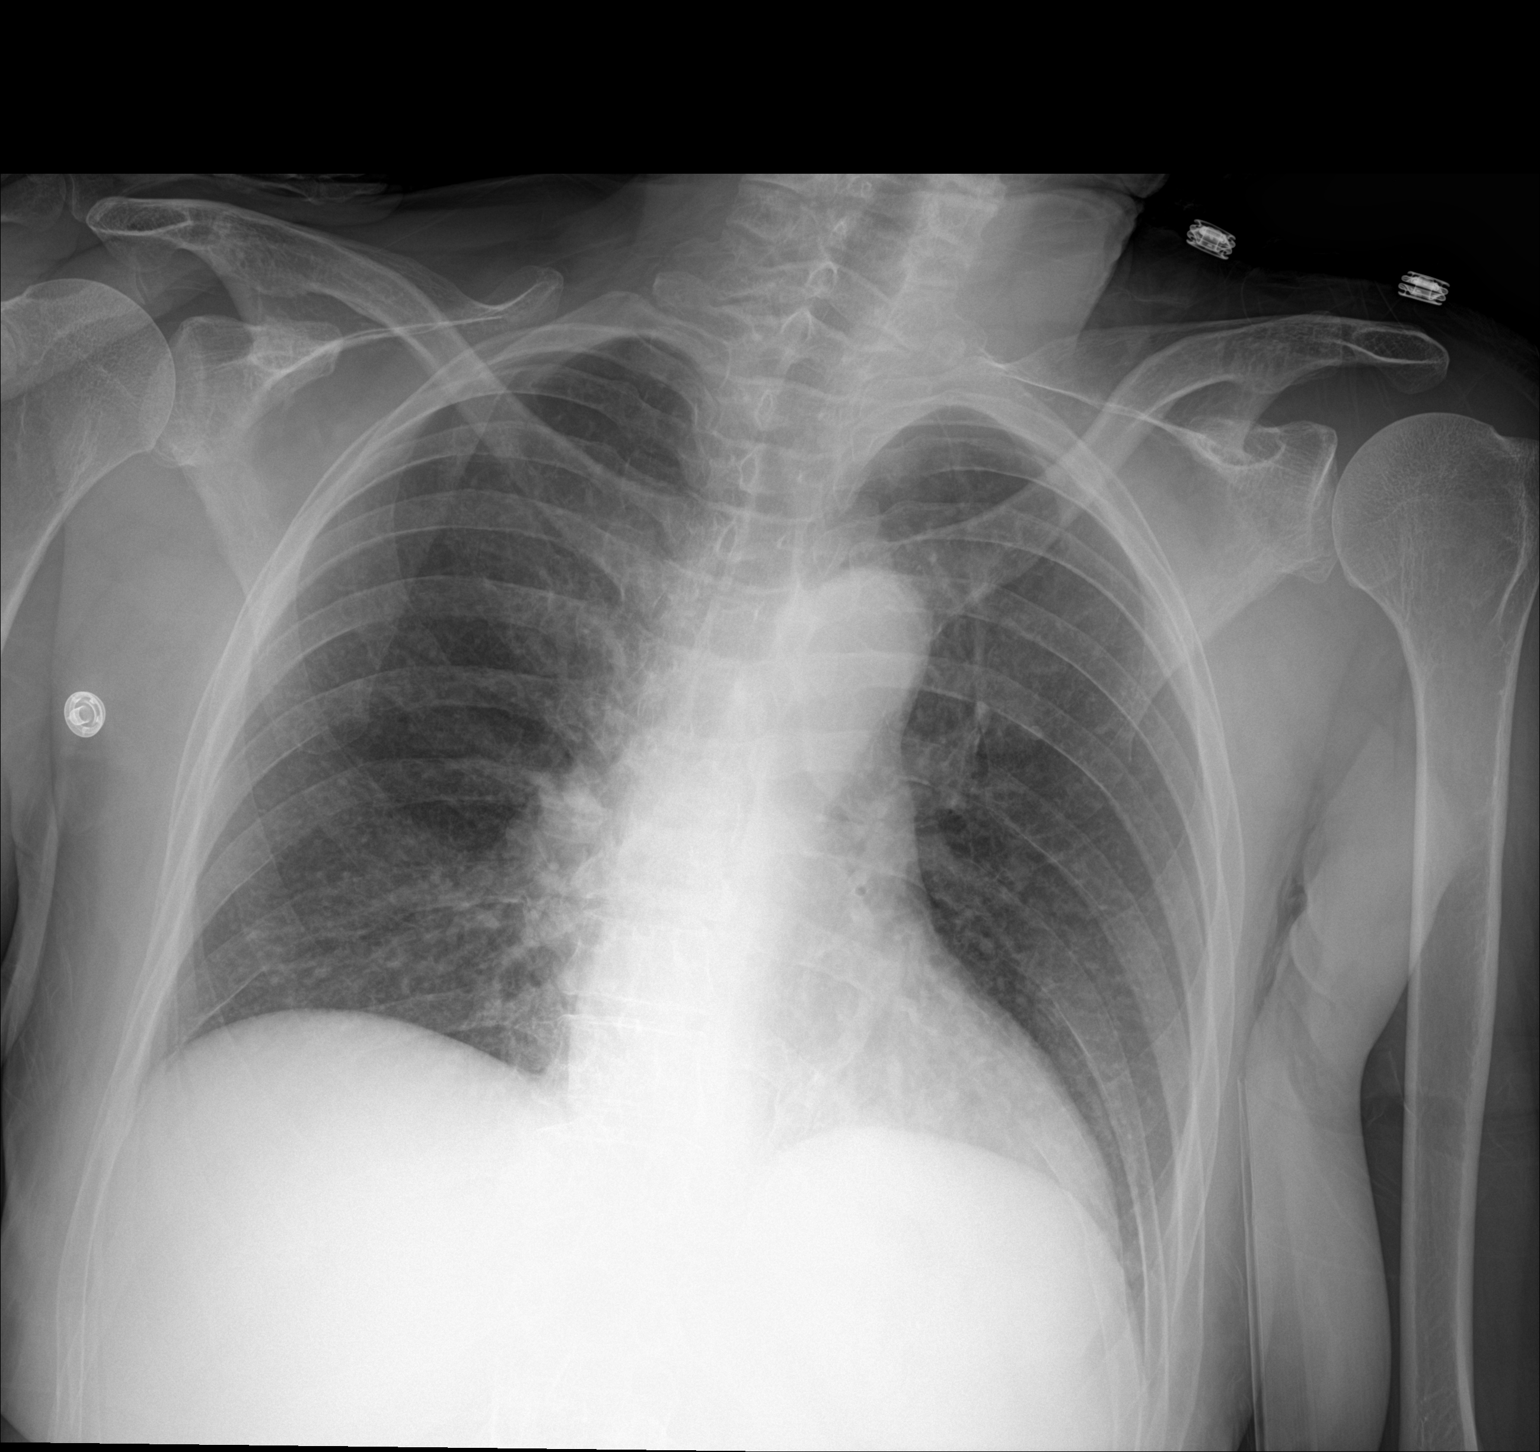

[1 of 1 positions shown; findings below may reference images not displayed]

FINDINGS: The heart size and mediastinal contours are within normal limits.
Both lungs are clear. The visualized skeletal structures are
unremarkable.
IMPRESSION: No active disease.

## 2021-07-06 IMAGING — MR MR LUMBAR SPINE WO/W CM
6 of 7 series · 34 of 48 positions shown · IV contrast (gadavist)
Comparison: None.

CLINICAL DATA: Low back pain.  Epidural abscess suspected.

EXAM:
MRI LUMBAR SPINE WITHOUT AND WITH CONTRAST
TECHNIQUE: Multiplanar and multiecho pulse sequences of the lumbar spine were
obtained without and with intravenous contrast.
CONTRAST:  5mL GADAVIST GADOBUTROL 1 MMOL/ML IV SOLN

[Series 6: T1 · sagittal · 4.0mm · 0.81mm/px · 3 of 13 slices shown (1 of 2)]
[im 1/13]
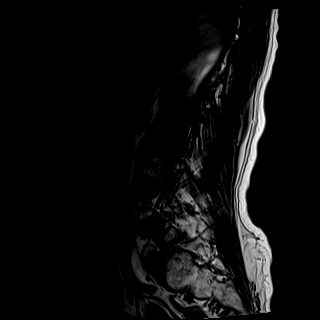
[im 7/13]
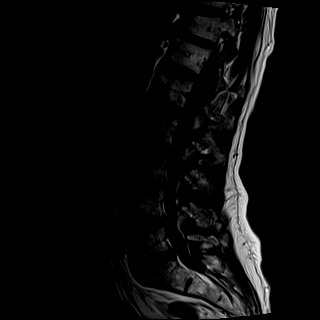
[im 13/13]
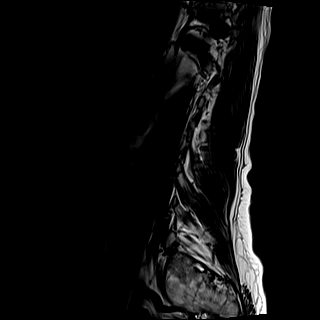

[Series 7: STIR · sagittal · 4.0mm · 0.51mm/px · 1 of 13 slices shown]
[im 1/13]
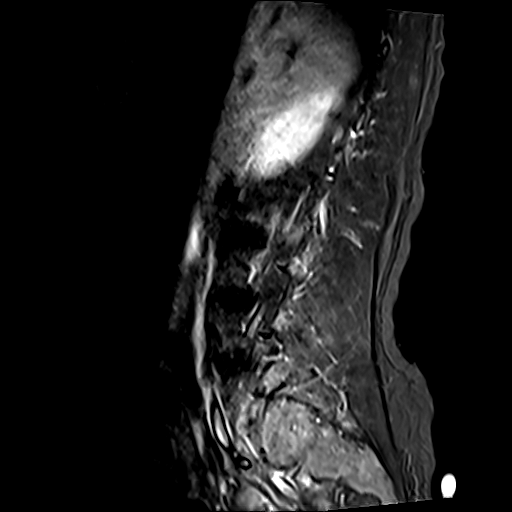

[Series 8: T2 · axial · 4.0mm · 0.62mm/px · z∈[-118,+113]mm · 11 of 42 slices shown]
[im 1/42]
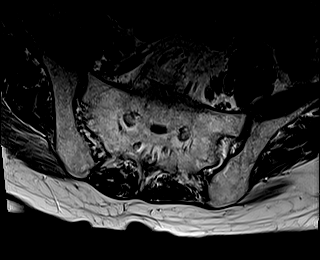
[im 5/42]
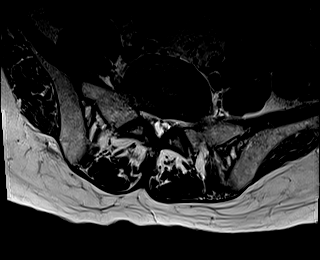
[im 9/42]
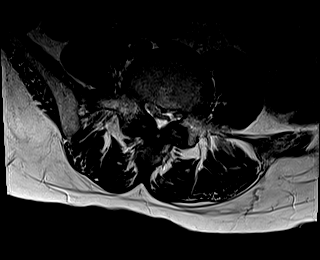
[im 13/42]
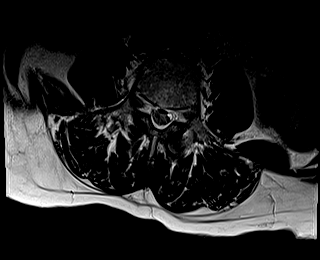
[im 17/42]
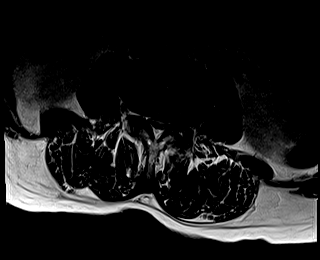
[im 21/42]
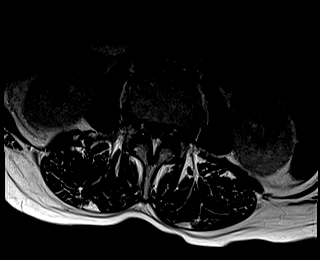
[im 25/42]
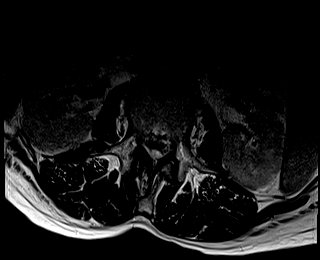
[im 29/42]
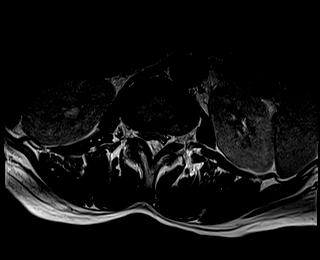
[im 33/42]
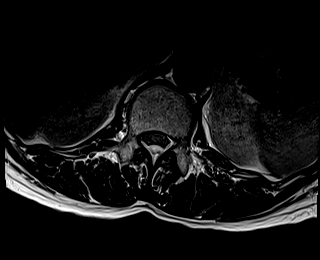
[im 37/42]
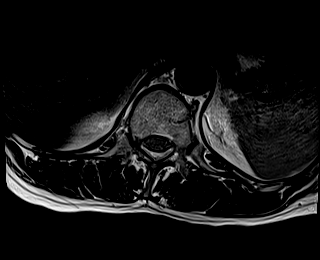
[im 42/42]
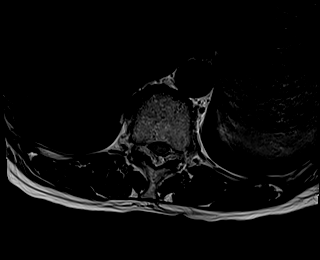

[Series 9: T1 · axial · 4.0mm · 0.39mm/px · z∈[-118,+113]mm · 11 of 42 slices shown (2 of 2)]
[im 1/42]
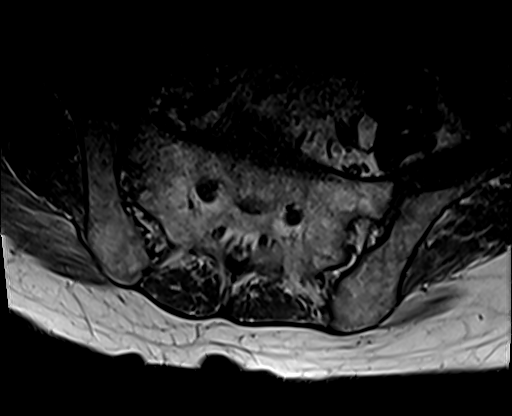
[im 5/42]
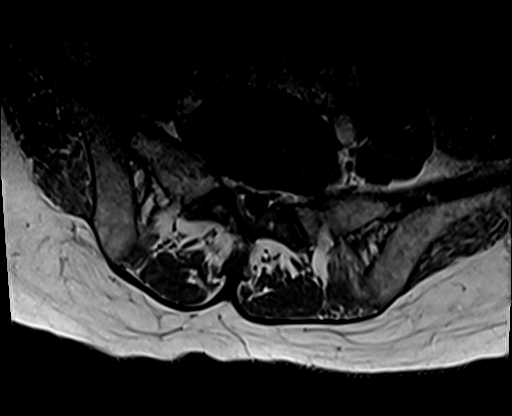
[im 9/42]
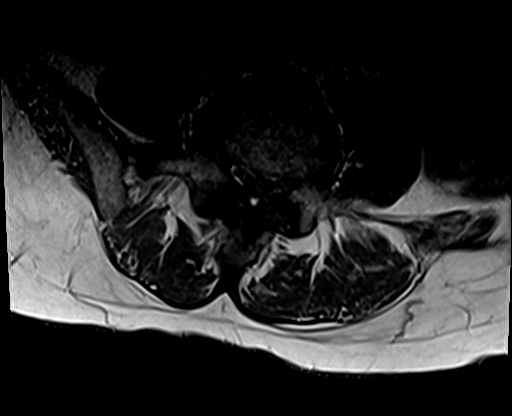
[im 13/42]
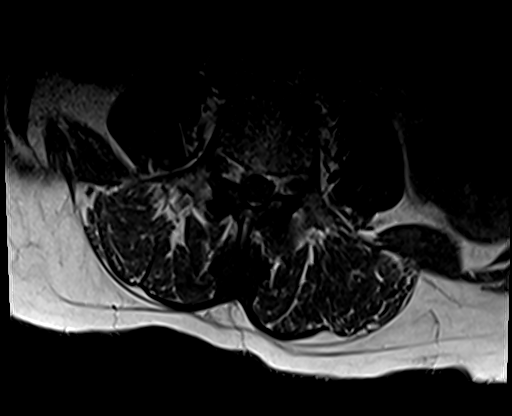
[im 17/42]
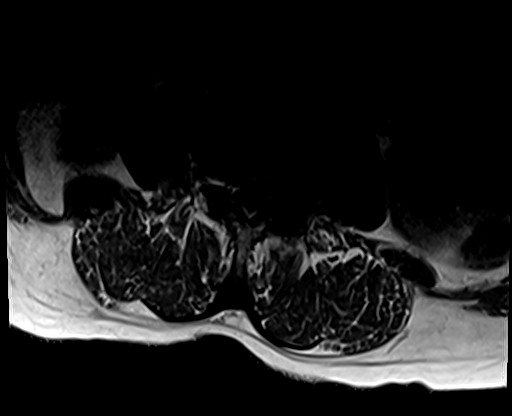
[im 21/42]
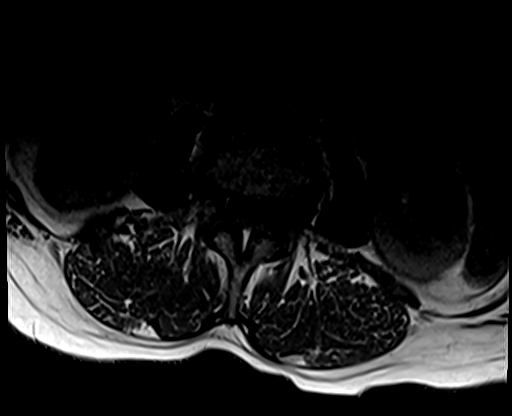
[im 25/42]
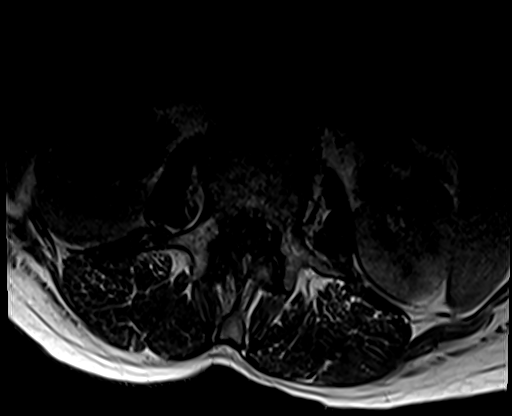
[im 29/42]
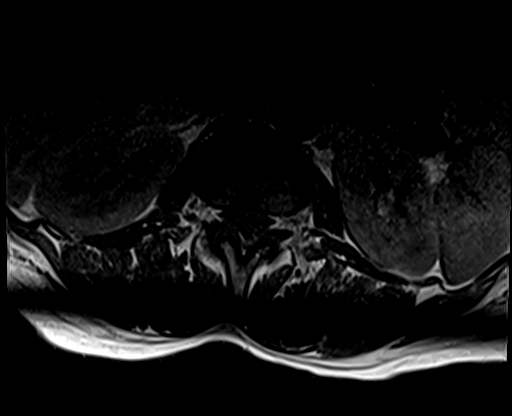
[im 33/42]
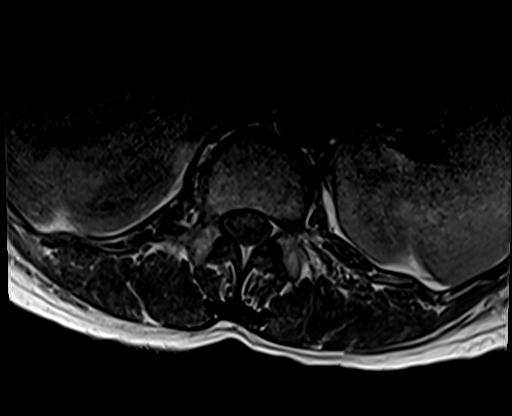
[im 37/42]
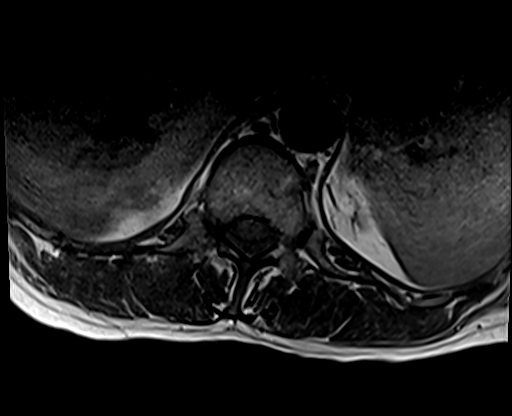
[im 42/42]
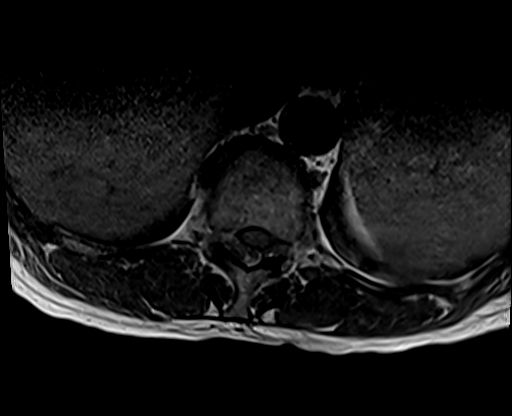

[Series 10: T2 post-contrast · sagittal · 4.0mm · 0.81mm/px · 4 of 13 slices shown]
[im 1/13]
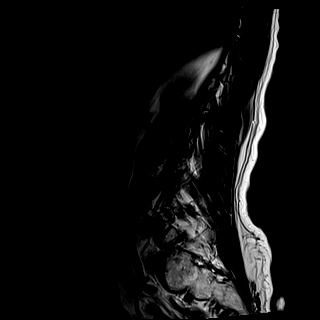
[im 5/13]
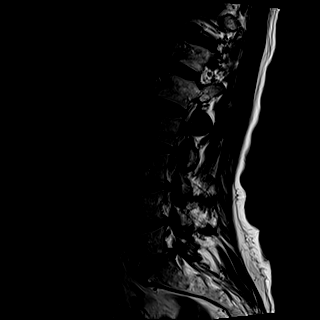
[im 9/13]
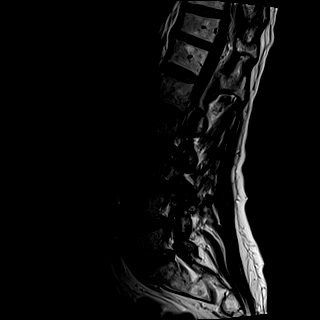
[im 13/13]
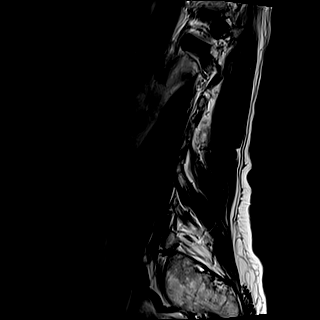

[Series 11: T1 fat-sat post-contrast · sagittal · 4.0mm · 0.81mm/px · 4 of 13 slices shown]
[im 1/13]
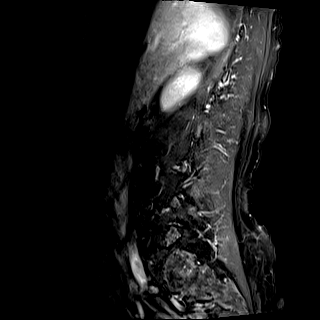
[im 5/13]
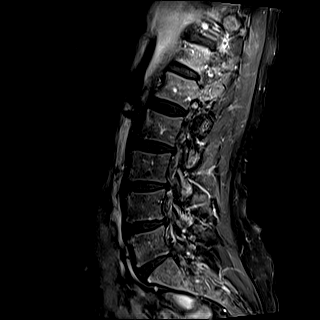
[im 9/13]
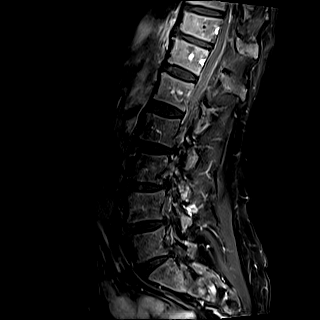
[im 13/13]
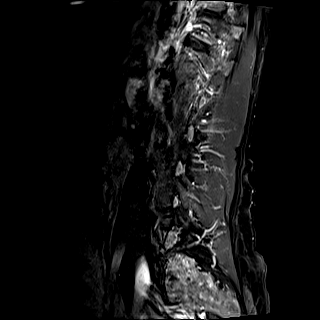

[34 of 48 positions shown; findings below may reference images not displayed]

FINDINGS: Segmentation:  Standard.

Alignment:  Physiologic.

Vertebrae:  No fracture, evidence of discitis, or bone lesion.

Conus medullaris and cauda equina: Conus extends to the L1 level.
There is a dorsal epidural collection extending from T12-L2 and that
measures 6 mm in greatest AP thickness, at the L1 level. There is
mild peripheral contrast enhancement. There is moderate mass effect
on the conus medullaris and cauda equina.

Paraspinal and other soft tissues: Negative

Disc levels: There is congenital narrowing of the spinal canal.

There is marked attenuation of the thecal sac at the T12-L2 levels
due to the presence of the above described dorsal epidural
collection.

L3-4: Left asymmetric disc bulge with severe spinal canal stenosis
and moderate left foraminal stenosis.

L4-5: Small disc bulge with severe spinal canal stenosis.

L5-S1: Small disc bulge with mild bilateral foraminal stenosis.

Visualized sacrum: Normal.
IMPRESSION: 1. Dorsal epidural abscess extending from T12-L2 and measuring 6 mm
in greatest AP thickness at the L1 level. Moderate mass effect on
the conus medullaris and cauda equina.
2. Severe spinal canal stenosis at L3-L4 and L4-L5 due to
combination of degenerative disc disease and congenitally narrow
spinal canal.

## 2021-07-06 IMAGING — CT CT HEAD W/O CM
3 of 7 series · 14 of 47 positions shown, 17 images · non-contrast
Comparison: Remote head CT [DATE]

CLINICAL DATA: Head trauma, focal neuro findings (Age 18-64y)



[Series 4: head wo · axial · 0.47mm/px · z∈[-124,-9]mm · 9 of 29 slices shown, 12 images]
[im 3/29  brain]
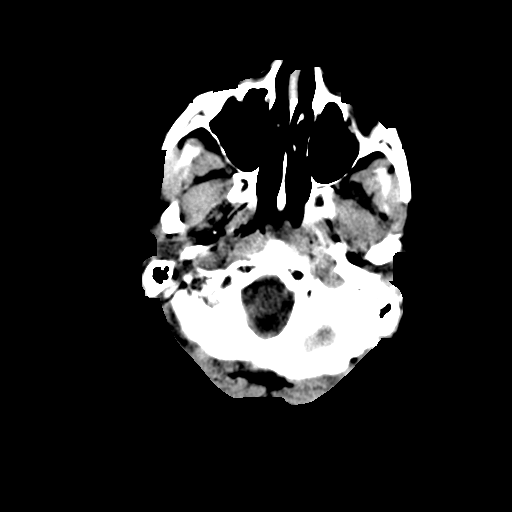
[im 3/29  bone]
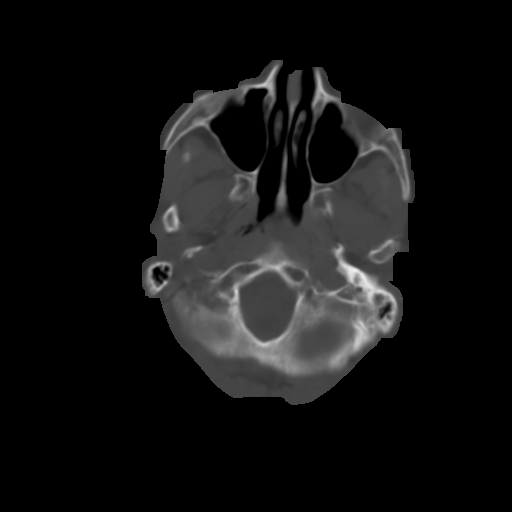
[im 5/29  brain]
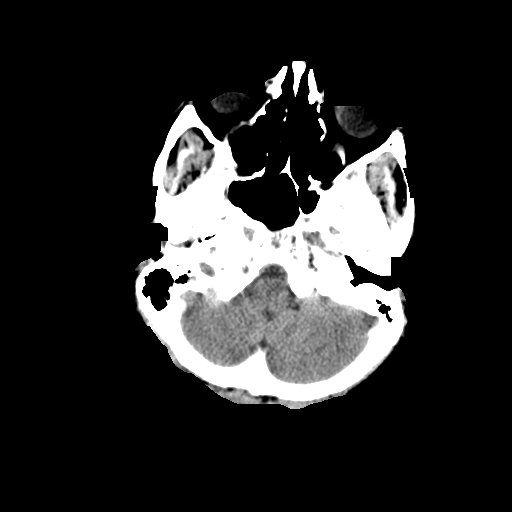
[im 10/29  brain]
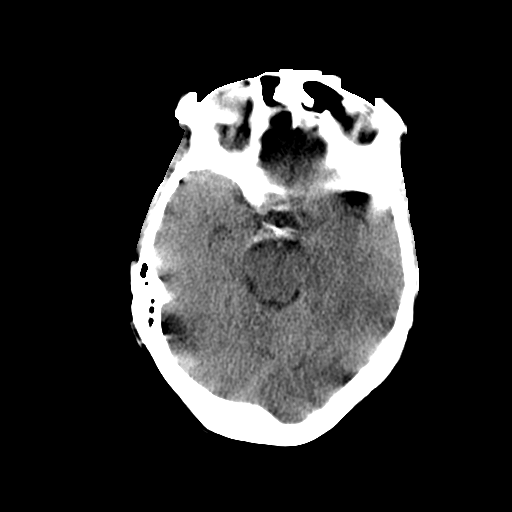
[im 12/29  brain]
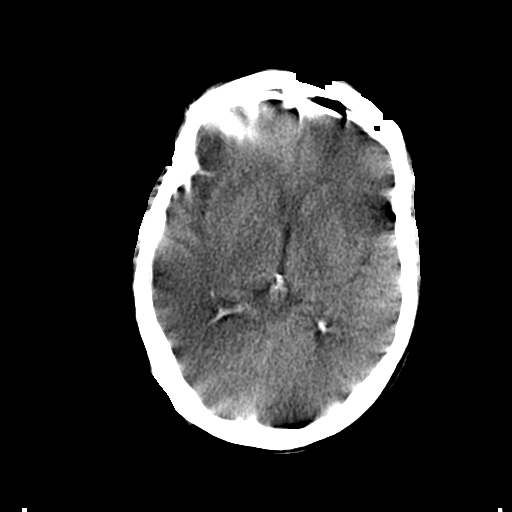
[im 15/29  brain]
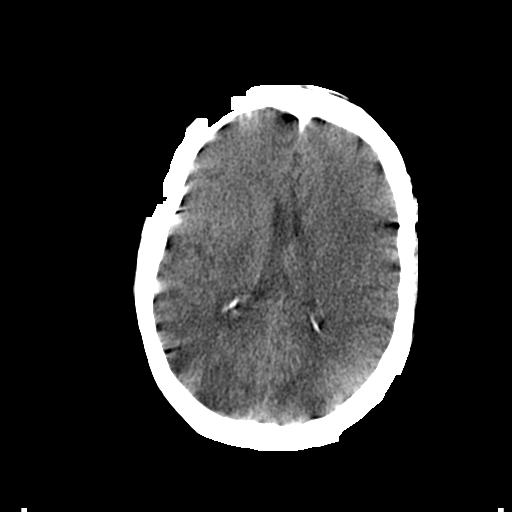
[im 15/29  bone]
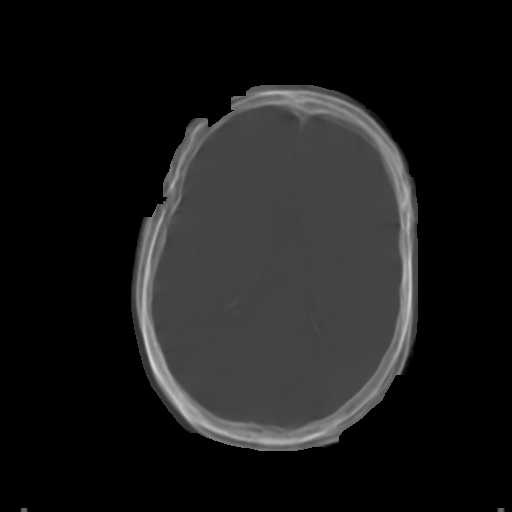
[im 17/29  brain]
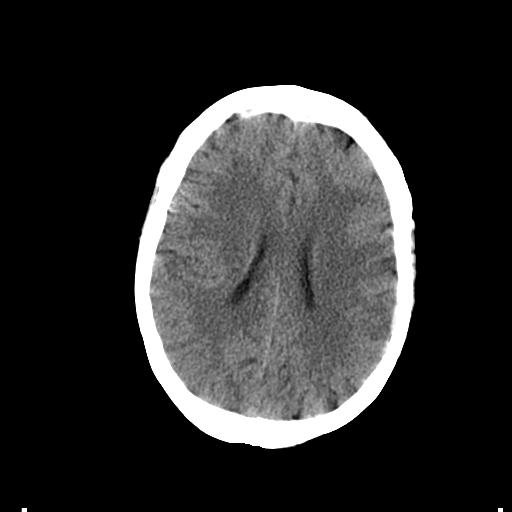
[im 19/29  brain]
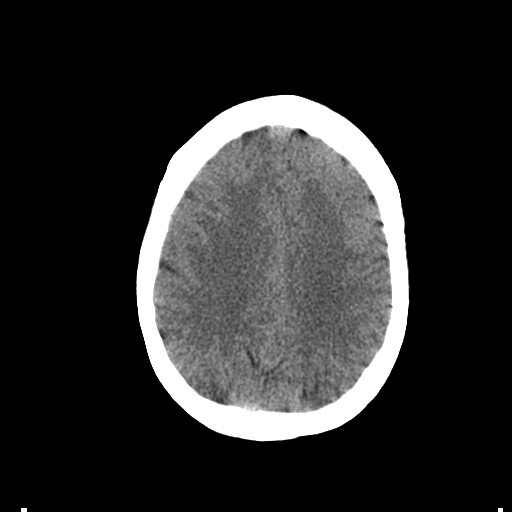
[im 24/29  brain]
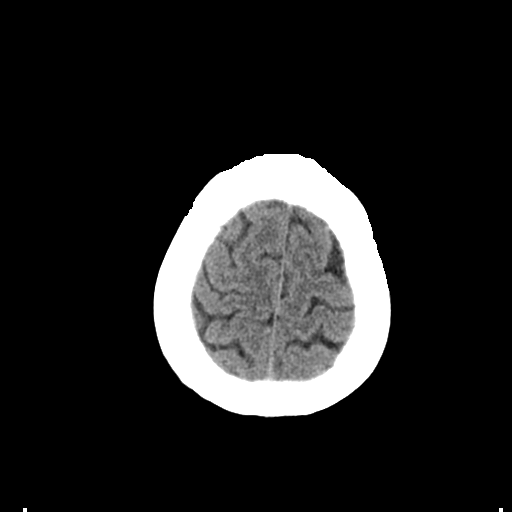
[im 26/29  brain]
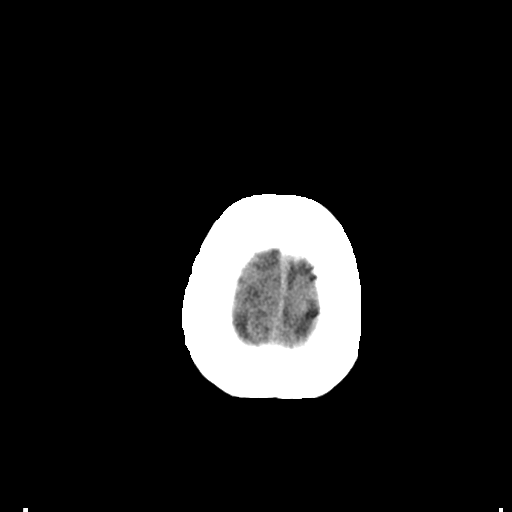
[im 26/29  bone]
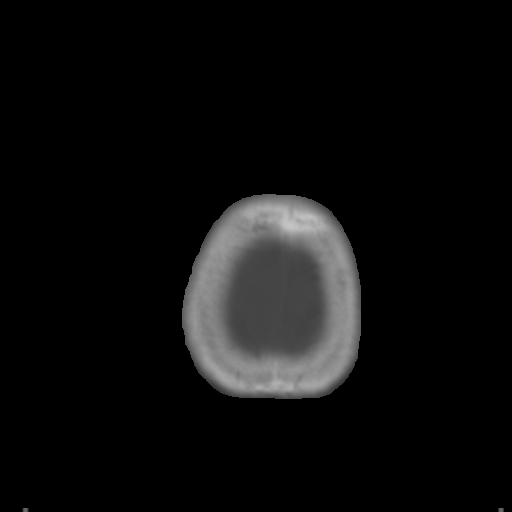

[Series 6: coronal soft tissue · coronal · 0.30mm/px · 2 of 84 slices shown]
[im 28/84  brain]
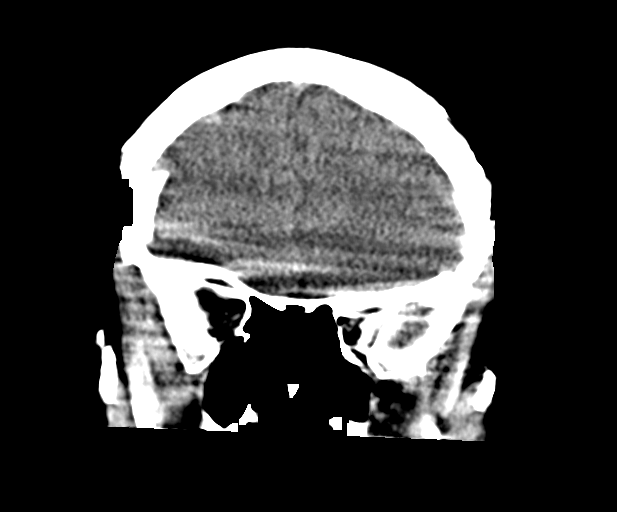
[im 56/84  brain]
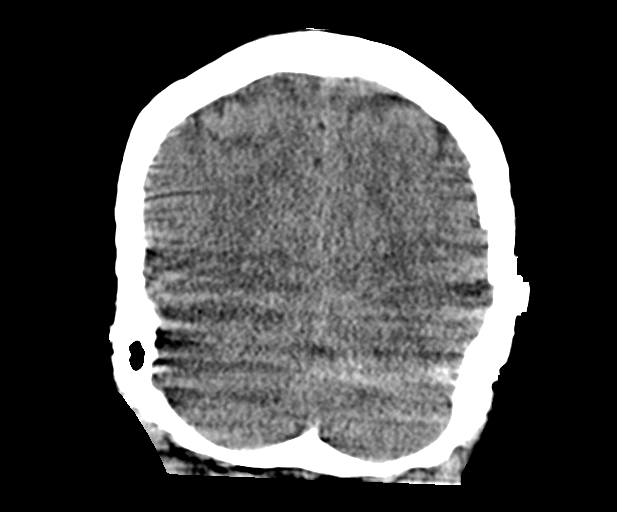

[Series 7: sagittal soft tissue · sagittal · 0.30mm/px · 3 of 63 slices shown]
[im 21/63  brain]
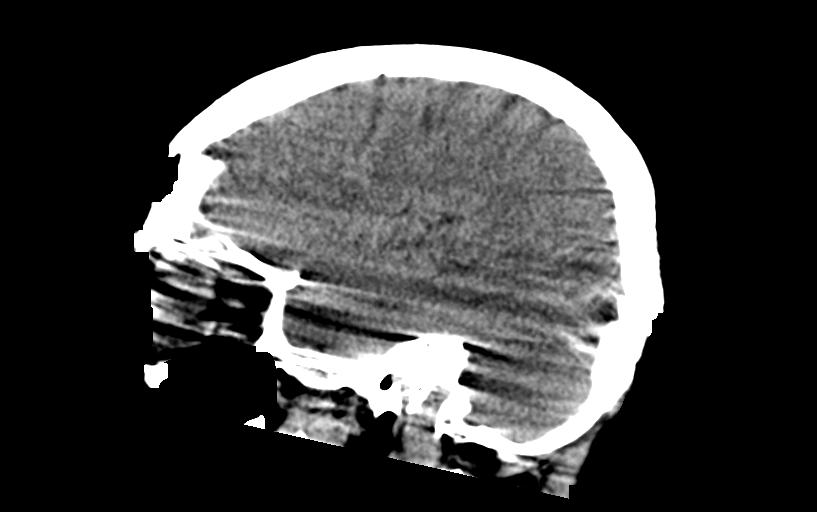
[im 32/63  brain]
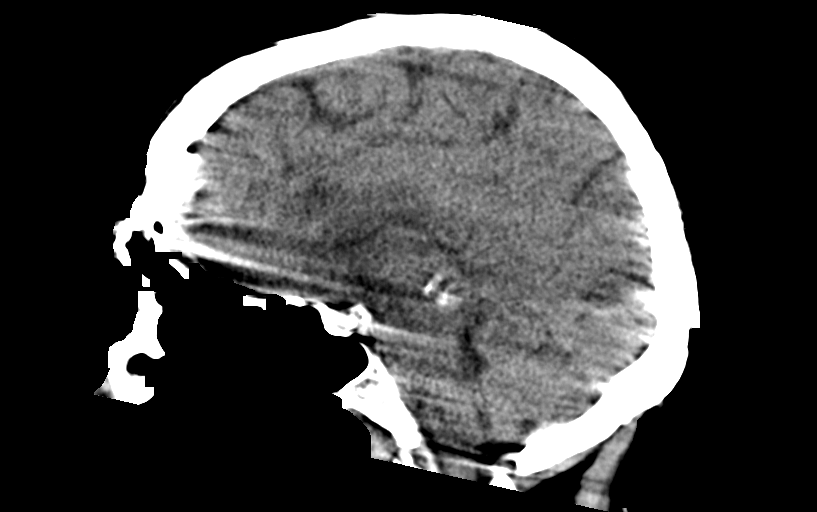
[im 42/63  brain]
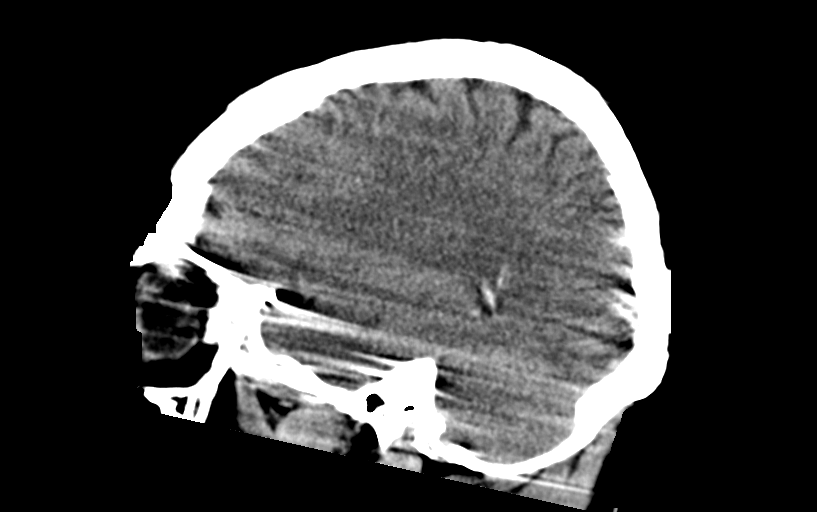

[14 of 47 positions shown; findings below may reference images not displayed]

FINDINGS: Brain: Patient had difficulty tolerating the exam, despite multiple
repeat acquisitions, there is moderate motion artifact limiting
assessment. Allowing for motion limitations, no evidence of
hemorrhage. No large subdural or extra-axial collection. No
hydrocephalus. Prominent perivascular space in the left basal
ganglia. No evidence of acute ischemia. No midline shift.

Vascular: No hyperdense vessel.

Skull: Assessment of the vertex is limited due to motion. Allowing
for this, no calvarial abnormality is seen.

Sinuses/Orbits: Leftward gaze, likely incidental. No acute findings.

Other: None.
IMPRESSION: Motion limited exam. Allowing for motion limitations, no evidence of
acute traumatic injury or acute findings.

## 2021-07-06 MED ORDER — MIDAZOLAM HCL 2 MG/2ML IJ SOLN
2.0000 mg | Freq: Once | INTRAMUSCULAR | Status: DC
Start: 2021-07-06 — End: 2021-07-06

## 2021-07-06 MED ORDER — SODIUM CHLORIDE 0.9 % IV SOLN
2.0000 g | Freq: Once | INTRAVENOUS | Status: AC
  Administered 2021-07-07: 2 g via INTRAVENOUS
  Filled 2021-07-06: qty 12.5

## 2021-07-06 MED ORDER — LACTATED RINGERS IV BOLUS
30.0000 mL/kg | Freq: Once | INTRAVENOUS | Status: AC
  Administered 2021-07-07: 1632 mL via INTRAVENOUS

## 2021-07-06 MED ORDER — LORAZEPAM 2 MG/ML IJ SOLN
1.0000 mg | Freq: Once | INTRAMUSCULAR | Status: AC
  Administered 2021-07-06: 1 mg via INTRAVENOUS
  Filled 2021-07-06: qty 1

## 2021-07-06 MED ORDER — VANCOMYCIN HCL IN DEXTROSE 1-5 GM/200ML-% IV SOLN
1000.0000 mg | Freq: Once | INTRAVENOUS | Status: AC
  Administered 2021-07-07: 1000 mg via INTRAVENOUS
  Filled 2021-07-06: qty 200

## 2021-07-06 MED ORDER — GADOBUTROL 1 MMOL/ML IV SOLN
5.0000 mL | Freq: Once | INTRAVENOUS | Status: AC | PRN
  Administered 2021-07-06: 5 mL via INTRAVENOUS

## 2021-07-06 MED ORDER — MIDAZOLAM HCL 2 MG/2ML IJ SOLN
10.0000 mg | Freq: Once | INTRAMUSCULAR | Status: AC
  Administered 2021-07-06: 10 mg via INTRAVENOUS
  Filled 2021-07-06: qty 10

## 2021-07-06 MED ORDER — MIDAZOLAM HCL 2 MG/2ML IJ SOLN
10.0000 mg | Freq: Once | INTRAMUSCULAR | Status: DC
  Filled 2021-07-06: qty 10

## 2021-07-06 MED ORDER — HYDROMORPHONE HCL 1 MG/ML IJ SOLN
1.0000 mg | Freq: Once | INTRAMUSCULAR | Status: AC
  Administered 2021-07-06: 1 mg via INTRAVENOUS
  Filled 2021-07-06: qty 1

## 2021-07-06 NOTE — ED Triage Notes (Signed)
Patient BIB from home c/o back pain radiating to groin area. Pt report she had IV Cocaine last night. Pt report unable to urinate for the past 24 hours. Pt denies N/V. Patient a/xo4. ? ?BP 150/90 ?HR 96 ?RR 20 ?O2sat 99% on RA ? ?

## 2021-07-06 NOTE — ED Notes (Signed)
Bladder Scan shows over 928m ?

## 2021-07-06 NOTE — ED Provider Notes (Signed)
?Received signout from previous provider, please see her note for complete H&P.  In short this is a 56 year old female significant history of IV drug use presenting complaining of back pain and acute urinary retention for the past 24 hours.  Labs and imaging was independently viewed interpreted by me.  Labs remarkable for positive cocaine on UDS, elevated white count of 20.6, and negative pregnancy test.  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  She also has severe spinal canal stenosis at level L3-4 and L4-5. ? ?This finding is congruent with patient's presentation.  She is currently receiving broad-spectrum antibiotic including vancomycin and cefepime.  She also received opiate pain medication.  She was noted to have an elevated blood pressure 160/90.  Her rectal temp is 99.3.  O2 sats of 100%. ? ?We will consult neurosurgery for additional management ? ?12:53 AM ?Appreciate consultation from neurosurgeon who agrees that patient will need to be transferred over to [facility] for further managements that she may benefit from surgical intervention.  Agrees with Vanco cefepime antibiotic.  Request for sed rate is to be checked as well.  Neurosurgeon request hospitalist admit patient.  At this time patient able to move her legs with bilateral weakness. ? ?1:32 AM ?Appreciate consultation from [facility] Dr. Velia Meyer who agrees to admit pt to [facility] for further management of her epidural abscess ? ?.Critical Care ?Performed by: Domenic Moras, PA-C ?Authorized by: Domenic Moras, PA-C  ? ?Critical care provider statement:  ?  Critical care time (minutes):  65 ?  Critical care was time spent personally by me on the following activities:  Development of treatment plan with  patient or surrogate, discussions with consultants, evaluation of patient's response to treatment, examination of patient, ordering and review of laboratory studies, ordering and review of radiographic studies, ordering and performing treatments and interventions, pulse oximetry, re-evaluation of patient's condition and review of old charts ? ?BP 123/89   Pulse 94   Temp 99.3 ?F (37.4 ?C) (Rectal)   Resp (!) 23   Ht '5\' 3"'$  (1.6 m)   Wt 54.4 kg   LMP  (LMP Unknown) Comment: approx 2 years   SpO2 93%   BMI 21.26 kg/m?  ? ?Results for orders placed or performed during the hospital encounter of 07/09/2021  ?Resp Panel by RT-PCR (Flu A&B, Covid) Nasopharyngeal Swab  ? Specimen: Nasopharyngeal Swab; Nasopharyngeal(NP) swabs in vial transport medium  ?Result Value Ref Range  ? SARS Coronavirus 2 by RT PCR NEGATIVE NEGATIVE  ? Influenza A by PCR NEGATIVE NEGATIVE  ? Influenza B by PCR NEGATIVE NEGATIVE  ?Comprehensive metabolic panel  ?Result Value Ref Range  ? Sodium 130 (L) 135 - 145 mmol/L  ? Potassium 3.2 (L) 3.5 - 5.1 mmol/L  ? Chloride 93 (L) 98 - 111 mmol/L  ? CO2 22 22 - 32 mmol/L  ? Glucose, Bld 119 (H) 70 - 99 mg/dL  ? BUN 21 (H) 6 - 20 mg/dL  ? Creatinine, Ser 0.67 0.44 - 1.00 mg/dL  ? Calcium 9.8 8.9 - 10.3 mg/dL  ? Total Protein 9.0 (H) 6.5 - 8.1 g/dL  ? Albumin 4.1 3.5 - 5.0 g/dL  ? AST 53 (H) 15 - 41 U/L  ? ALT 53 (H) 0 - 44 U/L  ?  Alkaline Phosphatase 85 38 - 126 U/L  ? Total Bilirubin 1.1 0.3 - 1.2 mg/dL  ? GFR, Estimated >60 >60 mL/min  ? Anion gap 15 5 - 15  ?CBC with Differential  ?Result Value Ref Range  ? WBC 20.6 (H) 4.0 - 10.5 K/uL  ? RBC 5.16 (H) 3.87 - 5.11 MIL/uL  ? Hemoglobin 13.7 12.0 - 15.0 g/dL  ? HCT 40.4 36.0 - 46.0 %  ? MCV 78.3 (L) 80.0 - 100.0 fL  ? MCH 26.6 26.0 - 34.0 pg  ? MCHC 33.9 30.0 - 36.0 g/dL  ? RDW 13.9 11.5 - 15.5 %  ? Platelets 235 150 - 400 K/uL  ? nRBC 0.0 0.0 - 0.2 %  ? Neutrophils Relative % 83 %  ? Neutro Abs 17.2 (H) 1.7 - 7.7 K/uL  ? Lymphocytes Relative 6 %  ?  Lymphs Abs 1.2 0.7 - 4.0 K/uL  ? Monocytes Relative 10 %  ? Monocytes Absolute 2.0 (H) 0.1 - 1.0 K/uL  ? Eosinophils Relative 0 %  ? Eosinophils Absolute 0.0 0.0 - 0.5 K/uL  ? Basophils Relative 0 %  ? Basophils Absolute 0.0 0.0 - 0.1 K/uL  ? Immature Granulocytes 1 %  ? Abs Immature Granulocytes 0.12 (H) 0.00 - 0.07 K/uL  ?Lipase, blood  ?Result Value Ref Range  ? Lipase 27 11 - 51 U/L  ?Urinalysis, Routine w reflex microscopic Urine, In & Out Cath  ?Result Value Ref Range  ? Color, Urine YELLOW YELLOW  ? APPearance CLEAR CLEAR  ? Specific Gravity, Urine 1.016 1.005 - 1.030  ? pH 6.0 5.0 - 8.0  ? Glucose, UA NEGATIVE NEGATIVE mg/dL  ? Hgb urine dipstick SMALL (A) NEGATIVE  ? Bilirubin Urine NEGATIVE NEGATIVE  ? Ketones, ur 20 (A) NEGATIVE mg/dL  ? Protein, ur 100 (A) NEGATIVE mg/dL  ? Nitrite NEGATIVE NEGATIVE  ? Leukocytes,Ua NEGATIVE NEGATIVE  ? RBC / HPF 6-10 0 - 5 RBC/hpf  ? WBC, UA 0-5 0 - 5 WBC/hpf  ? Bacteria, UA RARE (A) NONE SEEN  ? Squamous Epithelial / LPF 0-5 0 - 5  ? Mucus PRESENT   ?Rapid urine drug screen (hospital performed)  ?Result Value Ref Range  ? Opiates NONE DETECTED NONE DETECTED  ? Cocaine POSITIVE (A) NONE DETECTED  ? Benzodiazepines NONE DETECTED NONE DETECTED  ? Amphetamines NONE DETECTED NONE DETECTED  ? Tetrahydrocannabinol NONE DETECTED NONE DETECTED  ? Barbiturates NONE DETECTED NONE DETECTED  ?Ethanol  ?Result Value Ref Range  ? Alcohol, Ethyl (B) <10 <10 mg/dL  ?Pregnancy, urine  ?Result Value Ref Range  ? Preg Test, Ur NEGATIVE NEGATIVE  ?Lactic acid, plasma  ?Result Value Ref Range  ? Lactic Acid, Venous 1.9 0.5 - 1.9 mmol/L  ?Lactic acid, plasma  ?Result Value Ref Range  ? Lactic Acid, Venous 1.1 0.5 - 1.9 mmol/L  ? ?CT HEAD WO CONTRAST (5MM) ? ?Result Date: 07/24/2021 ?CLINICAL DATA:  Head trauma, focal neuro findings (Age 70-64y) EXAM: CT HEAD WITHOUT CONTRAST TECHNIQUE: Contiguous axial images were obtained from the base of the skull through the vertex without intravenous  contrast. RADIATION DOSE REDUCTION: This exam was performed according to the departmental dose-optimization program which includes automated exposure control, adjustment of the mA and/or kV according to patient size and/or use of iterative reconstruction technique. COMPARISON:  Remote head CT 02/22/2009 FINDINGS: Brain: Patient had difficulty tolerating the exam, despite multiple repeat acquisitions, there is moderate motion artifact limiting assessment. Allowing for motion limitations, no evidence of  hemorrhage. No large subdural or extra-axial collection. No hydrocephalus. Prominent perivascular space in the left basal ganglia. No evidence of acute ischemia. No midline shift. Vascular: No hyperdense vessel. Skull: Assessment of the vertex is limited due to motion. Allowing for this, no calvarial abnormality is seen. Sinuses/Orbits: Leftward gaze, likely incidental. No acute findings. Other: None. IMPRESSION: Motion limited exam. Allowing for motion limitations, no evidence of acute traumatic injury or acute findings. Electronically Signed   By: Keith Rake M.D.   On: 07/18/2021 22:15  ? ?MR Lumbar Spine W Wo Contrast ? ?Result Date: 07/11/2021 ?CLINICAL DATA:  Low back pain.  Epidural abscess suspected. EXAM: MRI LUMBAR SPINE WITHOUT AND WITH CONTRAST TECHNIQUE: Multiplanar and multiecho pulse sequences of the lumbar spine were obtained without and with intravenous contrast. CONTRAST:  3m GADAVIST GADOBUTROL 1 MMOL/ML IV SOLN COMPARISON:  None. FINDINGS: Segmentation:  Standard. Alignment:  Physiologic. Vertebrae:  No fracture, evidence of discitis, or bone lesion. Conus medullaris and cauda equina: Conus extends to the L1 level. There is a dorsal epidural collection extending from T12-L2 and that measures 6 mm in greatest AP thickness, at the L1 level. There is mild peripheral contrast enhancement. There is moderate mass effect on the conus medullaris and cauda equina. 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 There is marked attenuation of the thecal sac at the T12-L2 levels due to the presence of the above described dorsal epidural collection. L3-4: Left

## 2021-07-06 NOTE — ED Notes (Signed)
Delay in obtaining blood cultures and starting IV ABX d/t poor vasculature. IV 22G right Forearm infiltrated. IV team consult placed.  ?

## 2021-07-06 NOTE — ED Notes (Signed)
IV team at bedside 

## 2021-07-06 NOTE — ED Provider Notes (Signed)
?[facility] DEPT ?Provider Note ? ? ?CSN: [id number] ?Arrival date & time: 07/05/2021  1901 ? ?  ? ?History ? ?Chief Complaint  ?Patient presents with  ? Back Pain  ? ? ?Linda Cervantes is a 56 y.o. female anxiety, depression, paranoid schizophrenia, substance use, GERD, back pain, asthma.  Brought to the emergency department by EMS with a chief complaint of back pain, numbness to bilateral feet, urinary retention, and vomiting.  Patient reports that her back pain started 48 hours ago.  Pain has been getting progressively worse.  Pain is located throughout her entire back.  Pain is worse with touch and movement.  Patient reports that she has had numbness to bilateral feet developed over the last 48 hours as well.  Patient states that due to the numbness in her feet and she has had frequent falls.  Patient denies any loss of consciousness.  Patient reports that she has not been able to urinate in the last 24 hours.  States that she produced a minimal amount of urine this morning but has not been able to urinate since.  Patient also endorses suprapubic abdominal tenderness, nausea and vomiting.  Patient reports vomiting 4 times in the last 24 hours.  Patient describes emesis as "green." ? ?Patient denies any alcohol use.  She does endorse IV cocaine use.  Last injected herself with cocaine yesterday evening. ? ?Denies any fever, chills, blood in stool, melena, bowel/bladder incontinence, headache, visual disturbance, lightheadedness, syncope. ? ? ?Back Pain ?Associated symptoms: abdominal pain, dysuria and numbness   ?Associated symptoms: no chest pain, no fever, no headaches and no weakness   ? ?  ? ?Home Medications ?Prior to Admission medications   ?Medication Sig Start Date End Date Taking? Authorizing Provider  ?albuterol (VENTOLIN HFA) 108 (90 Base) MCG/ACT inhaler Inhale 2 puffs into the lungs every 6 (six) hours as needed for wheezing (wheezing). 10/20/20   Vevelyn Francois, NP   ?benzonatate (TESSALON) 100 MG capsule Take 1 capsule (100 mg total) by mouth 2 (two) times daily as needed for cough. 03/06/21   Bo Merino I, NP  ?cetirizine (ZYRTEC) 10 MG tablet Take 1 tablet (10 mg total) by mouth daily. 10/20/20   Vevelyn Francois, NP  ?FLUoxetine (PROZAC) 20 MG capsule Take 1 capsule (20 mg total) by mouth daily. 06/06/21 06/06/22  Nwoko, Terese Door, PA  ?fluticasone (FLONASE) 50 MCG/ACT nasal spray Place 1 spray into both nostrils daily. 04/28/21 10/25/21  Vevelyn Francois, NP  ?gabapentin (NEURONTIN) 300 MG capsule Take 1 capsule (300 mg total) by mouth 3 (three) times daily. 10/20/20   Vevelyn Francois, NP  ?ibuprofen (ADVIL) 600 MG tablet Take 1 tablet (600 mg total) by mouth every 6 (six) hours as needed. 04/24/21   Vevelyn Francois, NP  ?ipratropium-albuterol (DUONEB) 0.5-2.5 (3) MG/3ML SOLN Take 3 mLs by nebulization every 2 (two) hours as needed (shortness of breath/ cough). 05/19/21 08/17/21  Bo Merino I, NP  ?lidocaine (XYLOCAINE) 2 % solution Use as directed 15 mLs in the mouth or throat every 6 (six) hours as needed for mouth pain. 04/24/21 08/02/21  Vevelyn Francois, NP  ?meclizine (ANTIVERT) 25 MG tablet Take 1 tablet (25 mg total) by mouth 3 (three) times daily as needed for dizziness. 10/20/20   Vevelyn Francois, NP  ?METHADONE HCL PO Take 1 Dose by mouth daily.    [provider]  ?methocarbamol (ROBAXIN) 500 MG tablet Take 1 tablet (500 mg total) by  mouth at bedtime as needed for muscle spasms. 03/20/21   LampteyMyrene Galas, MD  ?mirtazapine (REMERON) 7.5 MG tablet Take 1 tablet (7.5 mg total) by mouth at bedtime. 06/06/21   Nwoko, Terese Door, PA  ?OLANZapine (ZYPREXA) 20 MG tablet Take 1 tablet (20 mg total) by mouth at bedtime. 06/06/21   Malachy Mood, PA  ?   ? ?Allergies    ?Amitriptyline hcl   ? ?Review of Systems   ?Review of Systems  ?Constitutional:  Negative for chills and fever.  ?Eyes:  Negative for visual disturbance.  ?Respiratory:  Negative for shortness of  breath.   ?Cardiovascular:  Negative for chest pain.  ?Gastrointestinal:  Positive for abdominal pain, nausea and vomiting. Negative for anal bleeding, blood in stool, constipation and diarrhea.  ?Genitourinary:  Positive for difficulty urinating and dysuria. Negative for vaginal bleeding, vaginal discharge and vaginal pain.  ?Musculoskeletal:  Positive for back pain. Negative for neck pain.  ?Skin:  Negative for color change and rash.  ?Neurological:  Positive for dizziness and numbness. Negative for tremors, seizures, syncope, facial asymmetry, speech difficulty, weakness, light-headedness and headaches.  ?Psychiatric/Behavioral:  Negative for confusion.   ? ?Physical Exam ?Updated Vital Signs ?BP (!) 157/99 (BP Location: Right Arm)   Pulse 85   Temp 97.6 ?F (36.4 ?C) (Oral)   Resp (!) 22   Ht '5\' 3"'$  (1.6 m)   Wt 54.4 kg   LMP  (LMP Unknown) Comment: approx 2 years   SpO2 99%   BMI 21.26 kg/m?  ?Physical Exam ?Vitals and nursing note reviewed.  ?Constitutional:   ?   General: She is not in acute distress. ?   Appearance: She is not ill-appearing, toxic-appearing or diaphoretic.  ?   Comments: Patient is extremely agitated, rolling in bed complaining of pain.  ?HENT:  ?   Head: Normocephalic.  ?Eyes:  ?   General: No scleral icterus.    ?   Right eye: No discharge.     ?   Left eye: No discharge.  ?Cardiovascular:  ?   Rate and Rhythm: Normal rate.  ?Pulmonary:  ?   Effort: Pulmonary effort is normal.  ?Abdominal:  ?   General: Abdomen is flat. There is no distension. There are no signs of injury.  ?   Palpations: Abdomen is soft. There is no mass or pulsatile mass.  ?   Tenderness: There is abdominal tenderness in the suprapubic area. There is no guarding or rebound.  ?   Hernia: There is no hernia in the umbilical area or ventral area.  ?Musculoskeletal:  ?   Cervical back: No swelling, edema, deformity, erythema, signs of trauma, lacerations, rigidity, spasms, tenderness or crepitus. Normal range of  motion.  ?   Thoracic back: Tenderness and bony tenderness present. No swelling, edema, deformity, signs of trauma, lacerations or spasms.  ?   Lumbar back: Tenderness and bony tenderness present. No swelling, edema, deformity, signs of trauma, lacerations or spasms.  ?   Comments: Patient has diffuse tenderness to thoracic and lumbar spine including midline tenderness.  No obvious deformity.  ?Skin: ?   General: Skin is warm and dry.  ?   Comments: Erythema noted to right buttocks and posterior thigh  ?Neurological:  ?   General: No focal deficit present.  ?   Mental Status: She is alert.  ?   GCS: GCS eye subscore is 4. GCS verbal subscore is 5. GCS motor subscore is 6.  ?  Cranial Nerves: Cranial nerves 2-12 are intact. No cranial nerve deficit, dysarthria or facial asymmetry.  ?   Motor: No weakness, tremor, seizure activity or pronator drift.  ?   Comments: Grip strength equal.  +5 strength to bilateral upper and lower extremities.  Patient reports she is unable to hold legs up against gravity due to complaints of back pain.  Patient is able to move both lower extremities in the bed without difficulty.  Sensation to light touch grossly intact to bilateral upper and lower extremities.  ?Psychiatric:     ?   Behavior: Behavior is cooperative.  ? ? ? ? ?ED Results / Procedures / Treatments   ?Labs ?(all labs ordered are listed, but only abnormal results are displayed) ?Labs Reviewed  ?COMPREHENSIVE METABOLIC PANEL - Abnormal; Notable for the following components:  ?    Result Value  ? Sodium 130 (*)   ? Potassium 3.2 (*)   ? Chloride 93 (*)   ? Glucose, Bld 119 (*)   ? BUN 21 (*)   ? Total Protein 9.0 (*)   ? AST 53 (*)   ? ALT 53 (*)   ? All other components within normal limits  ?CBC WITH DIFFERENTIAL/PLATELET - Abnormal; Notable for the following components:  ? WBC 20.6 (*)   ? RBC 5.16 (*)   ? MCV 78.3 (*)   ? Neutro Abs 17.2 (*)   ? Monocytes Absolute 2.0 (*)   ? Abs Immature Granulocytes 0.12 (*)   ? All  other components within normal limits  ?URINALYSIS, ROUTINE W REFLEX MICROSCOPIC - Abnormal; Notable for the following components:  ? Hgb urine dipstick SMALL (*)   ? Ketones, ur 20 (*)   ? Protein, ur 100 (*)   ? Bacteria, UA R

## 2021-07-06 NOTE — ED Notes (Signed)
Patient transported to CT 

## 2021-07-06 NOTE — ED Notes (Signed)
Patient transported to MRI 

## 2021-07-07 ENCOUNTER — Encounter (HOSPITAL_COMMUNITY): Payer: Self-pay | Admitting: Anesthesiology

## 2021-07-07 ENCOUNTER — Inpatient Hospital Stay (HOSPITAL_COMMUNITY): Payer: 59

## 2021-07-07 ENCOUNTER — Inpatient Hospital Stay (HOSPITAL_COMMUNITY): Payer: 59 | Admitting: Anesthesiology

## 2021-07-07 ENCOUNTER — Encounter (HOSPITAL_COMMUNITY): Payer: Self-pay | Admitting: Internal Medicine

## 2021-07-07 ENCOUNTER — Encounter (HOSPITAL_COMMUNITY): Admission: EM | Disposition: E | Payer: Self-pay | Source: Home / Self Care | Attending: Internal Medicine

## 2021-07-07 ENCOUNTER — Other Ambulatory Visit (HOSPITAL_COMMUNITY): Payer: 59

## 2021-07-07 ENCOUNTER — Other Ambulatory Visit: Payer: Self-pay

## 2021-07-07 DIAGNOSIS — R579 Shock, unspecified: Secondary | ICD-10-CM | POA: Diagnosis not present

## 2021-07-07 DIAGNOSIS — E871 Hypo-osmolality and hyponatremia: Secondary | ICD-10-CM | POA: Diagnosis not present

## 2021-07-07 DIAGNOSIS — I10 Essential (primary) hypertension: Secondary | ICD-10-CM | POA: Diagnosis present

## 2021-07-07 DIAGNOSIS — G928 Other toxic encephalopathy: Secondary | ICD-10-CM | POA: Diagnosis not present

## 2021-07-07 DIAGNOSIS — R7881 Bacteremia: Secondary | ICD-10-CM | POA: Diagnosis not present

## 2021-07-07 DIAGNOSIS — F418 Other specified anxiety disorders: Secondary | ICD-10-CM

## 2021-07-07 DIAGNOSIS — R451 Restlessness and agitation: Secondary | ICD-10-CM | POA: Diagnosis not present

## 2021-07-07 DIAGNOSIS — J449 Chronic obstructive pulmonary disease, unspecified: Secondary | ICD-10-CM

## 2021-07-07 DIAGNOSIS — R9431 Abnormal electrocardiogram [ECG] [EKG]: Secondary | ICD-10-CM | POA: Diagnosis not present

## 2021-07-07 DIAGNOSIS — G8220 Paraplegia, unspecified: Secondary | ICD-10-CM

## 2021-07-07 DIAGNOSIS — F200 Paranoid schizophrenia: Secondary | ICD-10-CM | POA: Diagnosis not present

## 2021-07-07 DIAGNOSIS — D509 Iron deficiency anemia, unspecified: Secondary | ICD-10-CM

## 2021-07-07 DIAGNOSIS — F1910 Other psychoactive substance abuse, uncomplicated: Secondary | ICD-10-CM | POA: Diagnosis not present

## 2021-07-07 DIAGNOSIS — G8389 Other specified paralytic syndromes: Secondary | ICD-10-CM | POA: Diagnosis not present

## 2021-07-07 DIAGNOSIS — G9341 Metabolic encephalopathy: Secondary | ICD-10-CM | POA: Diagnosis not present

## 2021-07-07 DIAGNOSIS — J9601 Acute respiratory failure with hypoxia: Secondary | ICD-10-CM

## 2021-07-07 DIAGNOSIS — G062 Extradural and subdural abscess, unspecified: Secondary | ICD-10-CM

## 2021-07-07 DIAGNOSIS — K219 Gastro-esophageal reflux disease without esophagitis: Secondary | ICD-10-CM | POA: Diagnosis present

## 2021-07-07 DIAGNOSIS — Z66 Do not resuscitate: Secondary | ICD-10-CM | POA: Diagnosis not present

## 2021-07-07 DIAGNOSIS — M549 Dorsalgia, unspecified: Secondary | ICD-10-CM | POA: Diagnosis not present

## 2021-07-07 DIAGNOSIS — F419 Anxiety disorder, unspecified: Secondary | ICD-10-CM | POA: Diagnosis not present

## 2021-07-07 DIAGNOSIS — A4102 Sepsis due to Methicillin resistant Staphylococcus aureus: Secondary | ICD-10-CM | POA: Diagnosis not present

## 2021-07-07 DIAGNOSIS — R296 Repeated falls: Secondary | ICD-10-CM | POA: Diagnosis present

## 2021-07-07 DIAGNOSIS — A419 Sepsis, unspecified organism: Secondary | ICD-10-CM | POA: Diagnosis not present

## 2021-07-07 DIAGNOSIS — Z20822 Contact with and (suspected) exposure to covid-19: Secondary | ICD-10-CM | POA: Diagnosis not present

## 2021-07-07 DIAGNOSIS — F1490 Cocaine use, unspecified, uncomplicated: Secondary | ICD-10-CM | POA: Diagnosis present

## 2021-07-07 DIAGNOSIS — G9340 Encephalopathy, unspecified: Secondary | ICD-10-CM | POA: Diagnosis present

## 2021-07-07 DIAGNOSIS — D72829 Elevated white blood cell count, unspecified: Secondary | ICD-10-CM

## 2021-07-07 DIAGNOSIS — Z515 Encounter for palliative care: Secondary | ICD-10-CM | POA: Diagnosis not present

## 2021-07-07 DIAGNOSIS — F1190 Opioid use, unspecified, uncomplicated: Secondary | ICD-10-CM | POA: Diagnosis not present

## 2021-07-07 DIAGNOSIS — B182 Chronic viral hepatitis C: Secondary | ICD-10-CM | POA: Diagnosis present

## 2021-07-07 DIAGNOSIS — J9811 Atelectasis: Secondary | ICD-10-CM | POA: Diagnosis not present

## 2021-07-07 DIAGNOSIS — R6521 Severe sepsis with septic shock: Secondary | ICD-10-CM | POA: Diagnosis not present

## 2021-07-07 DIAGNOSIS — E876 Hypokalemia: Secondary | ICD-10-CM | POA: Diagnosis present

## 2021-07-07 DIAGNOSIS — G061 Intraspinal abscess and granuloma: Secondary | ICD-10-CM | POA: Diagnosis not present

## 2021-07-07 DIAGNOSIS — R338 Other retention of urine: Secondary | ICD-10-CM

## 2021-07-07 DIAGNOSIS — G9529 Other cord compression: Secondary | ICD-10-CM | POA: Diagnosis present

## 2021-07-07 DIAGNOSIS — F32A Depression, unspecified: Secondary | ICD-10-CM | POA: Diagnosis present

## 2021-07-07 DIAGNOSIS — R0602 Shortness of breath: Secondary | ICD-10-CM | POA: Diagnosis not present

## 2021-07-07 DIAGNOSIS — R578 Other shock: Secondary | ICD-10-CM | POA: Diagnosis not present

## 2021-07-07 HISTORY — PX: RADIOLOGY WITH ANESTHESIA: SHX6223

## 2021-07-07 LAB — COMPREHENSIVE METABOLIC PANEL
ALT: 37 U/L (ref 0–44)
AST: 29 U/L (ref 15–41)
Albumin: 3.2 g/dL — ABNORMAL LOW (ref 3.5–5.0)
Alkaline Phosphatase: 69 U/L (ref 38–126)
Anion gap: 11 (ref 5–15)
BUN: 15 mg/dL (ref 6–20)
CO2: 24 mmol/L (ref 22–32)
Calcium: 9 mg/dL (ref 8.9–10.3)
Chloride: 94 mmol/L — ABNORMAL LOW (ref 98–111)
Creatinine, Ser: 0.62 mg/dL (ref 0.44–1.00)
GFR, Estimated: >60 mL/min (ref >60)
Glucose, Bld: 118 mg/dL — ABNORMAL HIGH (ref 70–99)
Potassium: 3.4 mmol/L — ABNORMAL LOW (ref 3.5–5.1)
Sodium: 129 mmol/L — ABNORMAL LOW (ref 135–145)
Total Bilirubin: 0.7 mg/dL (ref 0.3–1.2)
Total Protein: 7.4 g/dL (ref 6.5–8.1)

## 2021-07-07 LAB — TSH: TSH: 0.569 u[IU]/mL (ref 0.350–4.500)

## 2021-07-07 LAB — BLOOD CULTURE ID PANEL (REFLEXED) - BCID2

## 2021-07-07 LAB — CBC WITH DIFFERENTIAL/PLATELET
Abs Immature Granulocytes: 0.22 10*3/uL — ABNORMAL HIGH (ref 0.00–0.07)
Basophils Absolute: 0 10*3/uL (ref 0.0–0.1)
Basophils Relative: 0 %
Eosinophils Absolute: 0 10*3/uL (ref 0.0–0.5)
Eosinophils Relative: 0 %
HCT: 34.9 % — ABNORMAL LOW (ref 36.0–46.0)
Hemoglobin: 11.5 g/dL — ABNORMAL LOW (ref 12.0–15.0)
Immature Granulocytes: 1 %
Lymphocytes Relative: 5 %
Lymphs Abs: 0.9 10*3/uL (ref 0.7–4.0)
MCH: 25.9 pg — ABNORMAL LOW (ref 26.0–34.0)
MCHC: 33 g/dL (ref 30.0–36.0)
MCV: 78.6 fL — ABNORMAL LOW (ref 80.0–100.0)
Monocytes Absolute: 2.1 10*3/uL — ABNORMAL HIGH (ref 0.1–1.0)
Monocytes Relative: 11 %
Neutro Abs: 15.3 10*3/uL — ABNORMAL HIGH (ref 1.7–7.7)
Neutrophils Relative %: 83 %
Platelets: 214 10*3/uL (ref 150–400)
RBC: 4.44 MIL/uL (ref 3.87–5.11)
RDW: 14.1 % (ref 11.5–15.5)
WBC: 18.6 10*3/uL — ABNORMAL HIGH (ref 4.0–10.5)
nRBC: 0 % (ref 0.0–0.2)

## 2021-07-07 LAB — OSMOLALITY, URINE: Osmolality, Ur: 599 mOsm/kg (ref 300–900)

## 2021-07-07 LAB — PROTIME-INR
INR: 1 (ref 0.8–1.2)
Prothrombin Time: 13.5 seconds (ref 11.4–15.2)

## 2021-07-07 LAB — LACTIC ACID, PLASMA: Lactic Acid, Venous: 1.1 mmol/L (ref 0.5–1.9)

## 2021-07-07 LAB — OSMOLALITY: Osmolality: 272 mOsm/kg — ABNORMAL LOW (ref 275–295)

## 2021-07-07 LAB — SODIUM, URINE, RANDOM: Sodium, Ur: 114 mmol/L

## 2021-07-07 LAB — CREATININE, URINE, RANDOM: Creatinine, Urine: 59.24 mg/dL

## 2021-07-07 LAB — ABO/RH: ABO/RH(D): A NEG

## 2021-07-07 LAB — SEDIMENTATION RATE: Sed Rate: 54 mm/hr — ABNORMAL HIGH (ref 0–22)

## 2021-07-07 LAB — MAGNESIUM: Magnesium: 1.7 mg/dL (ref 1.7–2.4)

## 2021-07-07 LAB — C-REACTIVE PROTEIN: CRP: 25.3 mg/dL — ABNORMAL HIGH (ref <1.0)

## 2021-07-07 LAB — GLUCOSE, CAPILLARY: Glucose-Capillary: 125 mg/dL — ABNORMAL HIGH (ref 70–99)

## 2021-07-07 IMAGING — MR MR THORACIC SPINE WO/W CM
4 of 9 series · 19 of 48 positions shown · IV contrast (gadavist)
Comparison: None.

CLINICAL DATA: Epidural abscess

EXAM:
MRI THORACIC WITHOUT AND WITH CONTRAST
TECHNIQUE: Multiplanar and multiecho pulse sequences of the thoracic spine were
obtained without and with intravenous contrast.
CONTRAST:  5.5mL GADAVIST GADOBUTROL 1 MMOL/ML IV SOLN

[Series 2: T1 · sagittal · 3.0mm · 0.90mm/px · 4 of 17 slices shown (1 of 2)]
[im 1/17]
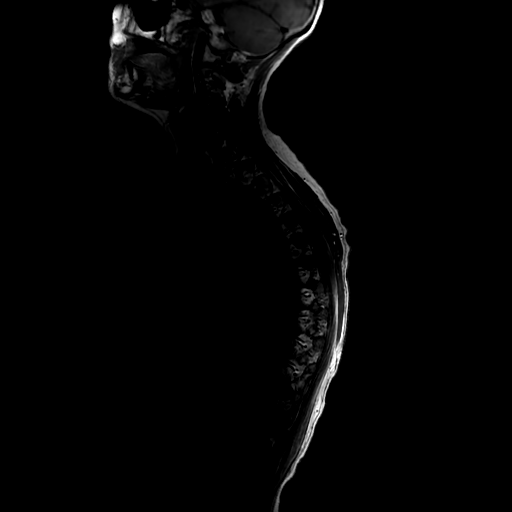
[im 6/17]
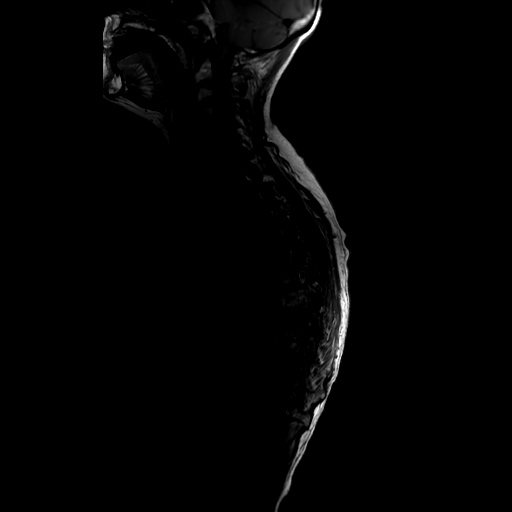
[im 11/17]
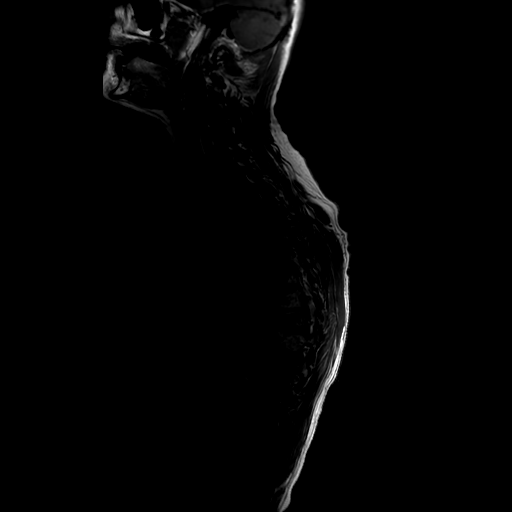
[im 17/17]
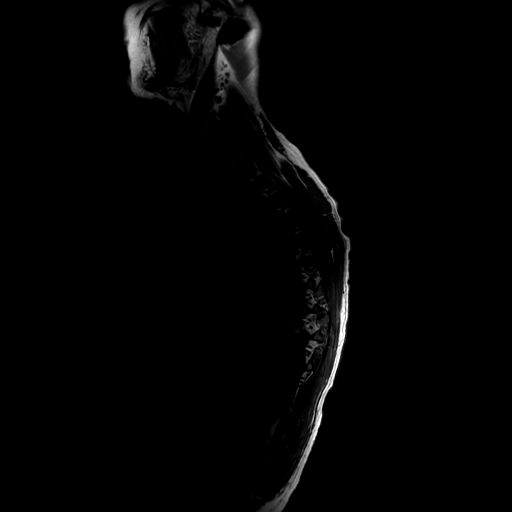

[Series 6: T2 · sagittal · 3.0mm · 0.66mm/px · 4 of 21 slices shown (1 of 2)]
[im 1/21]
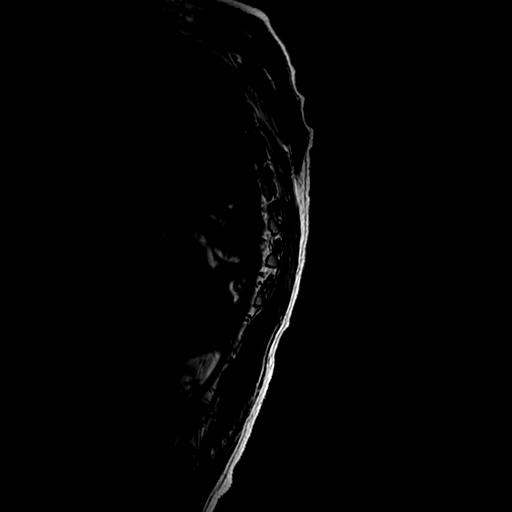
[im 7/21]
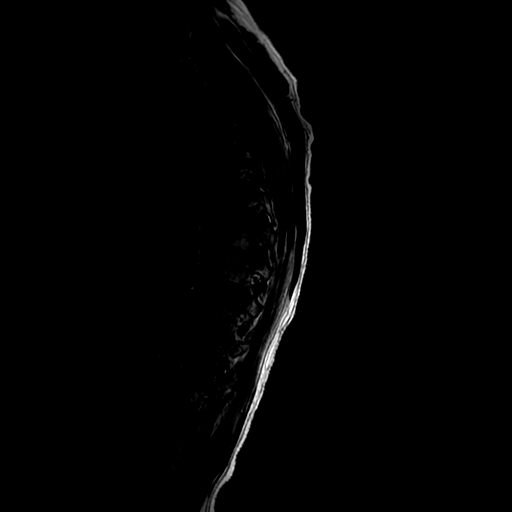
[im 14/21]
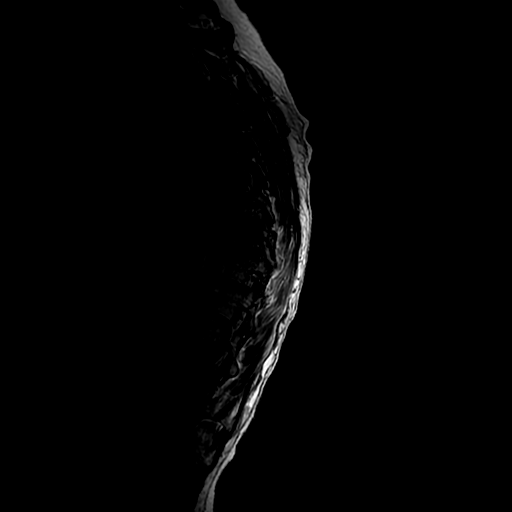
[im 21/21]
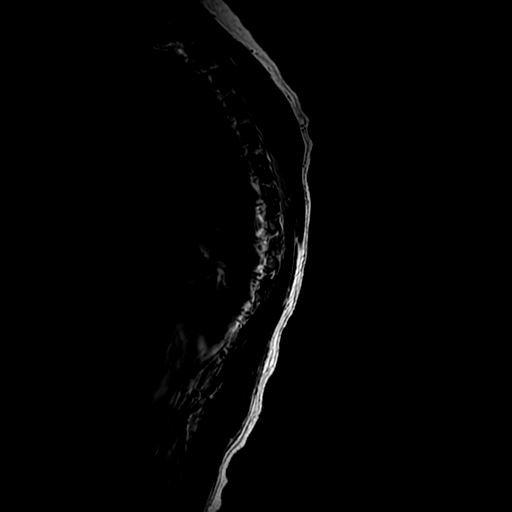

[Series 7: T1 · sagittal · 3.0mm · 0.66mm/px · 4 of 21 slices shown (2 of 2)]
[im 1/21]
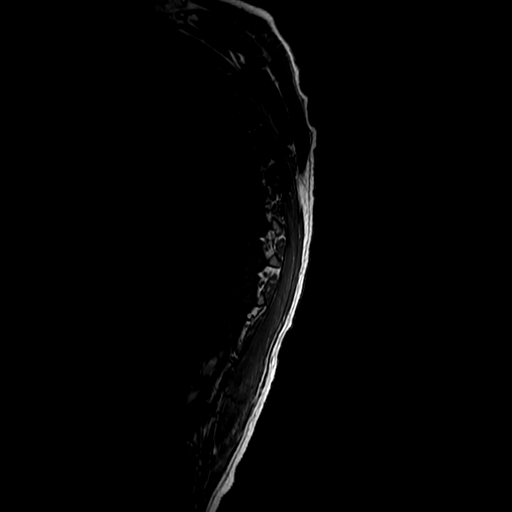
[im 7/21]
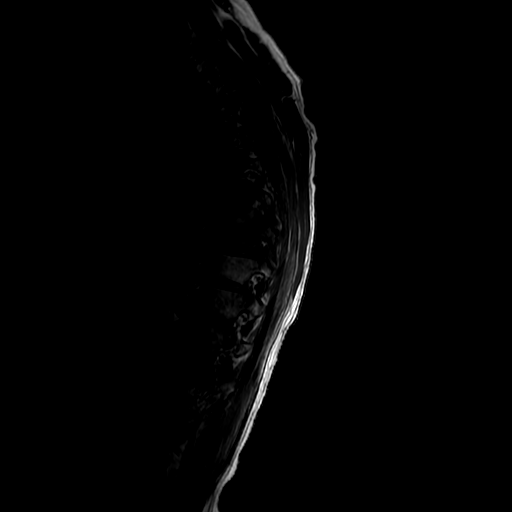
[im 14/21]
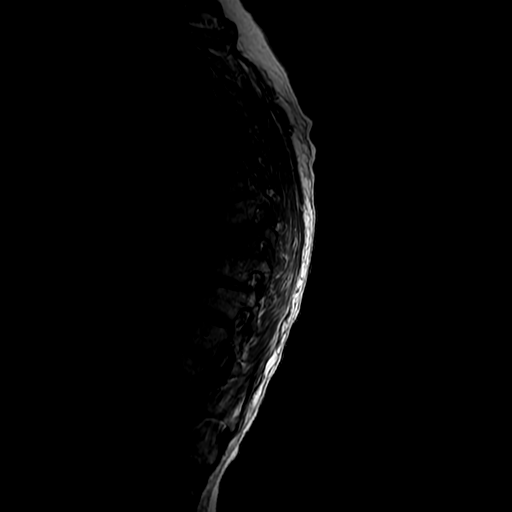
[im 21/21]
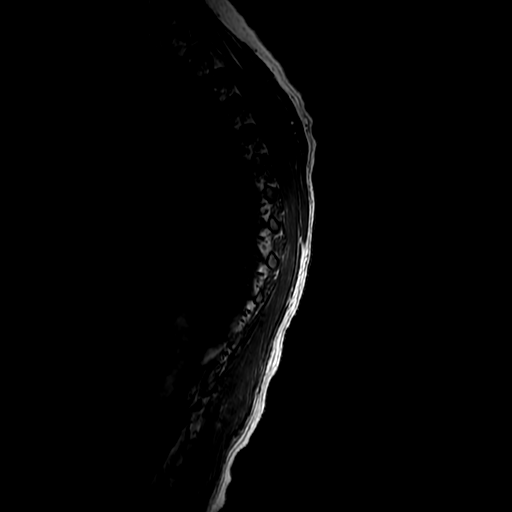

[Series 8: T2 · axial · 4.0mm · 0.39mm/px · z∈[-380,-222]mm · 7 of 39 slices shown (2 of 2)]
[im 1/39]
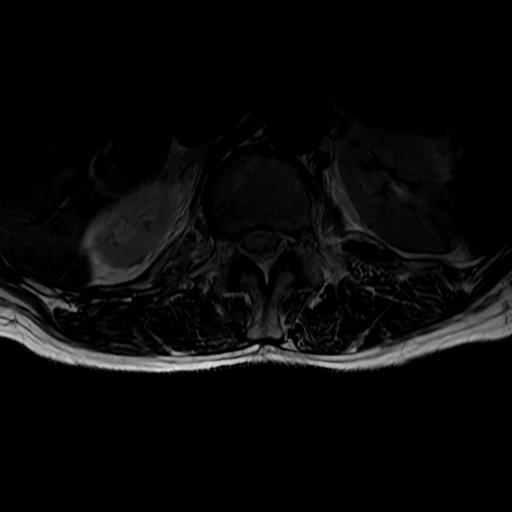
[im 7/39]
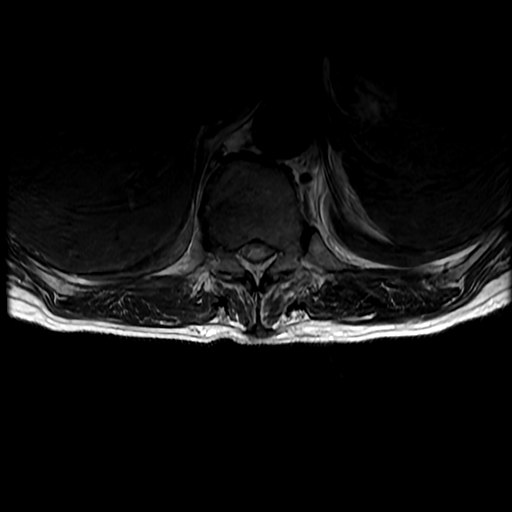
[im 13/39]
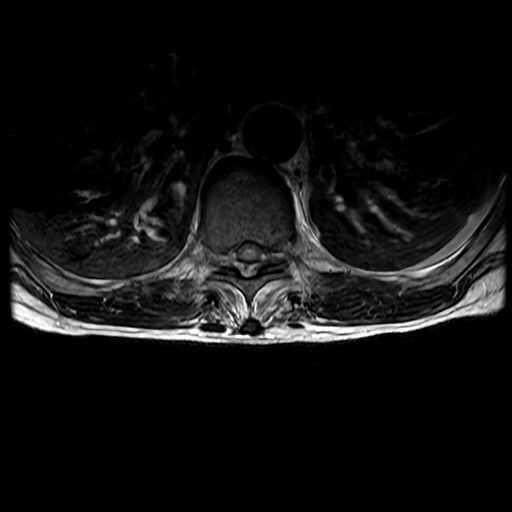
[im 20/39]
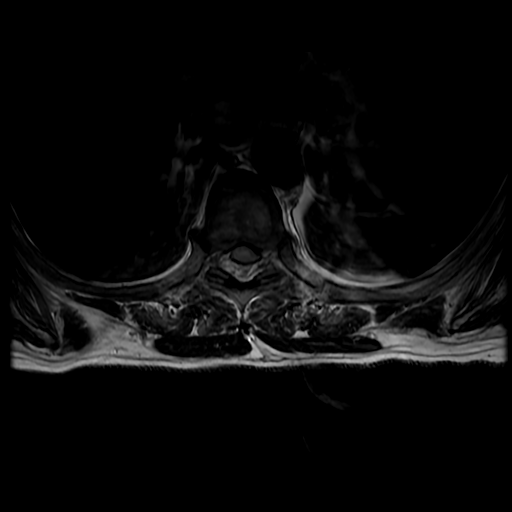
[im 26/39]
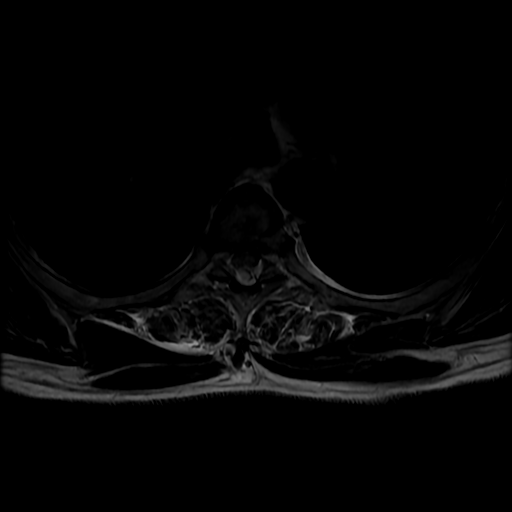
[im 32/39]
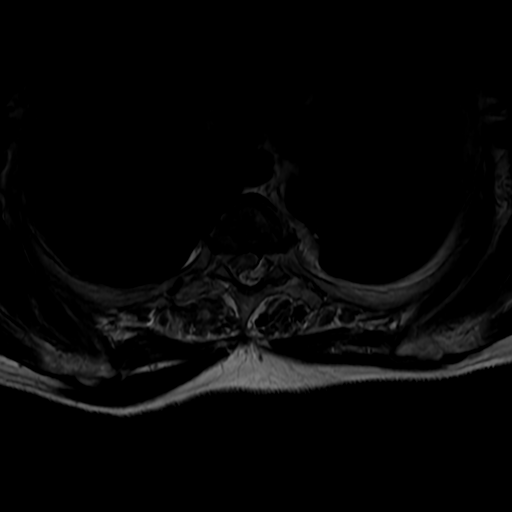
[im 39/39]
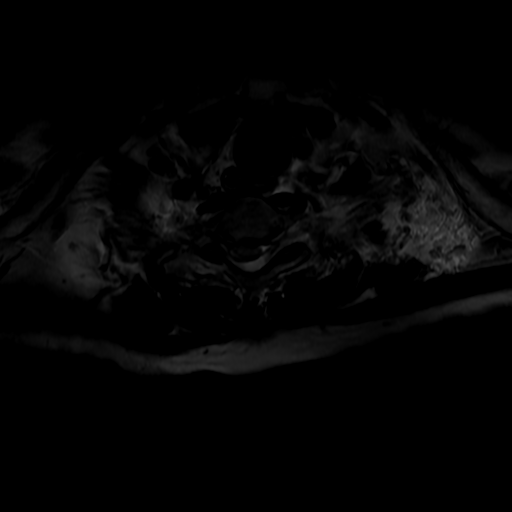

[19 of 48 positions shown; findings below may reference images not displayed]

FINDINGS: Alignment:  Physiologic.

Vertebrae: Chronic compression fracture of T8 with 75% anterior
height loss.

Cord: There is a large dorsal epidural abscess that extends the
length of the thoracic spine. The greatest thickness is at the T3
level 6 mm. There is mass effect on the spinal cord with diffuse
cord edema at the T6-11 levels.

Paraspinal and other soft tissues: Bibasilar consolidation in the
lungs.

Disc levels:

No bony spinal canal stenosis. Severe 10 UA shin of the thecal sac
due to mass effect from the abscess.
IMPRESSION: 1. Large dorsal epidural abscess extending the length of the
thoracic spine, measuring up to 6 mm in thickness.
2. Compressive myelopathy of the spinal cord with diffuse cord edema
at the T6-11 levels secondary to mass effect from the epidural
abscess.
3. Bibasilar consolidation in the lungs.

## 2021-07-07 IMAGING — DX DG CHEST 1V PORT
1 series · 1 of 1 positions shown · non-contrast
Comparison: [DATE]

CLINICAL DATA: Check endotracheal tube placement

EXAM:
PORTABLE CHEST 1 VIEW

[chest]
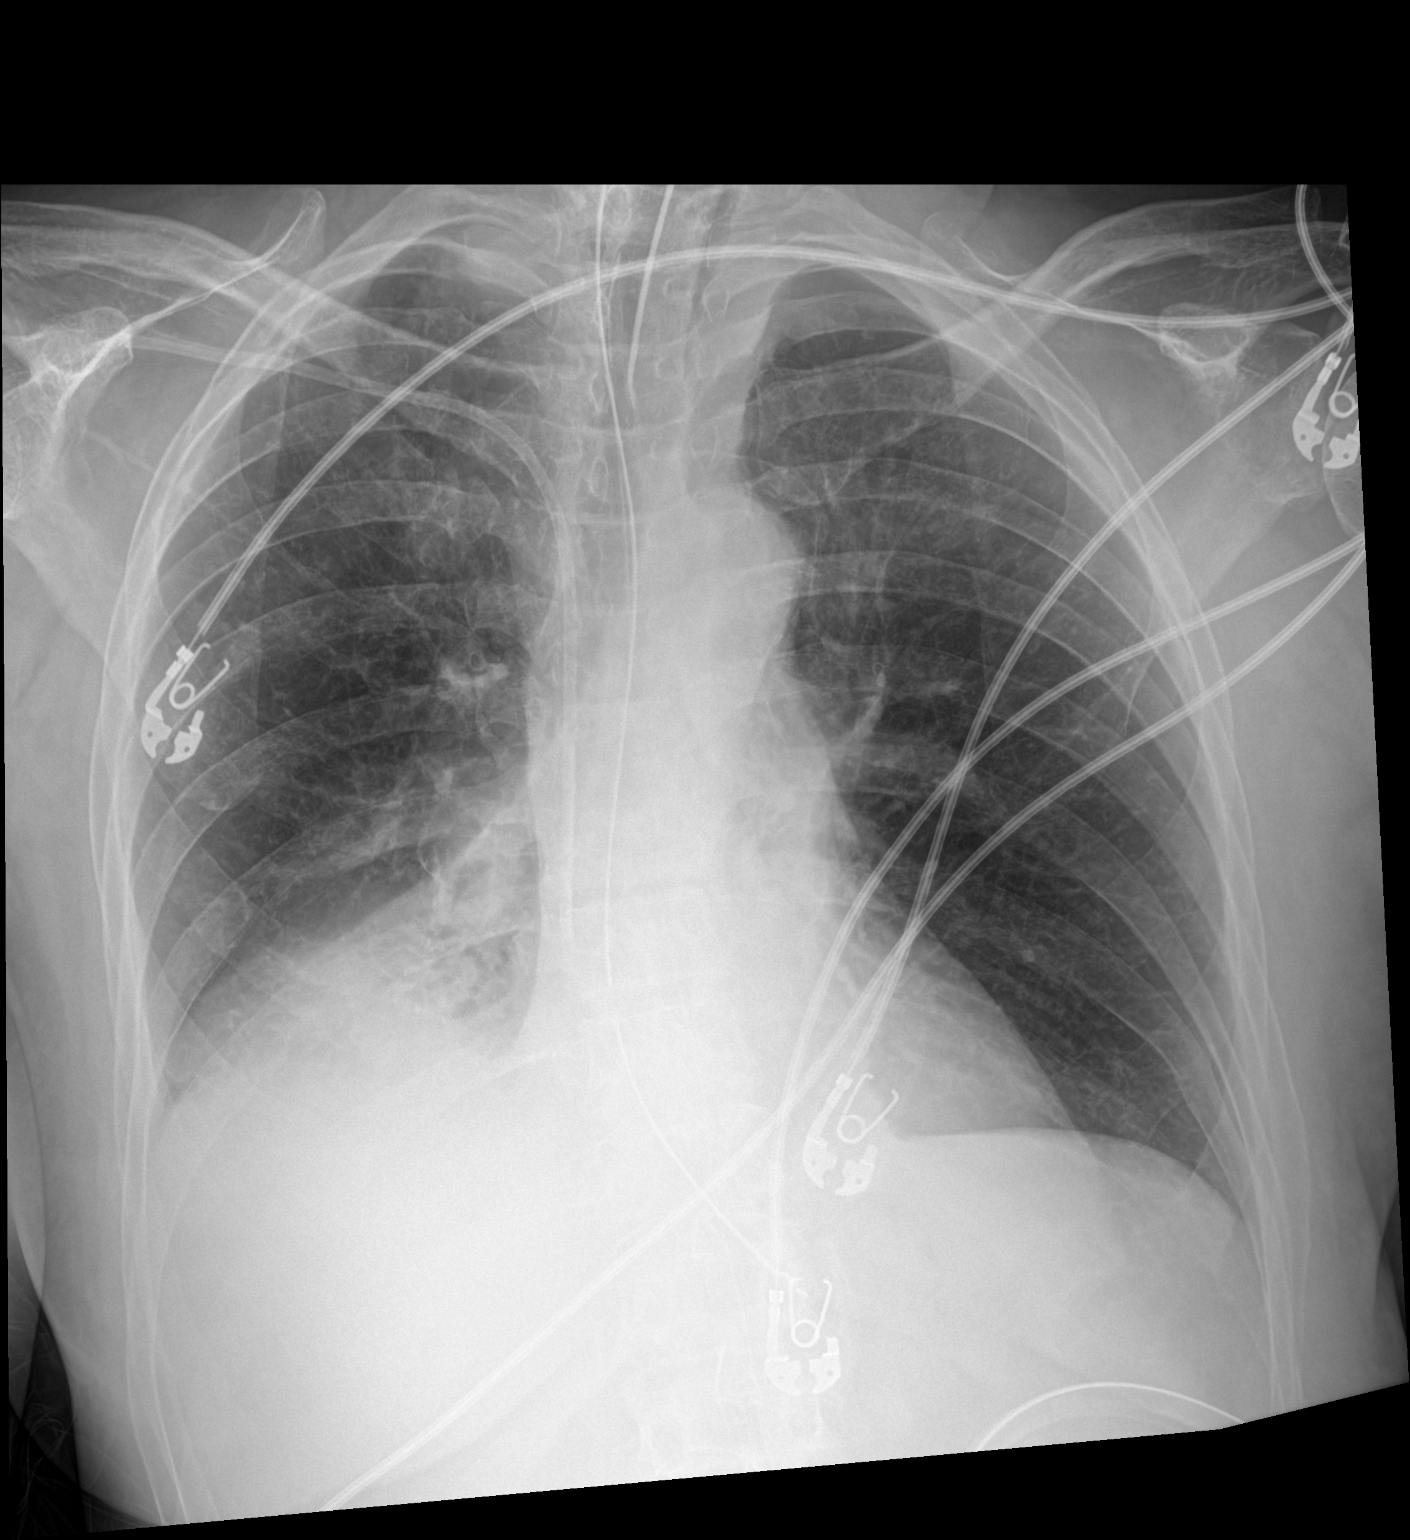

[1 of 1 positions shown; findings below may reference images not displayed]

FINDINGS: Cardiac shadow is stable. Endotracheal tube is noted in satisfactory
position. Gastric catheter is noted in the distal esophagus and
should be advanced several cm deeper into the stomach. Increasing
right basilar consolidation is noted when compared with the prior
exam. New right subclavian central line is seen. No pneumothorax is
noted.
IMPRESSION: Increasing right basilar consolidation.

Tubes and lines as described above.

## 2021-07-07 SURGERY — MRI WITH ANESTHESIA
Anesthesia: General

## 2021-07-07 MED ORDER — MIDAZOLAM HCL 2 MG/2ML IJ SOLN
INTRAMUSCULAR | Status: DC | PRN
Start: 2021-07-07 — End: 2021-07-07
  Administered 2021-07-07: 2 mg via INTRAVENOUS

## 2021-07-07 MED ORDER — BUPIVACAINE-EPINEPHRINE 0.5% -1:200000 IJ SOLN
INTRAMUSCULAR | Status: AC
  Filled 2021-07-07: qty 1

## 2021-07-07 MED ORDER — THROMBIN 20000 UNITS EX SOLR
CUTANEOUS | Status: AC
  Filled 2021-07-07: qty 20000

## 2021-07-07 MED ORDER — SODIUM CHLORIDE 0.9% FLUSH
10.0000 mL | Freq: Two times a day (BID) | INTRAVENOUS | Status: DC
  Administered 2021-07-08: 20 mL
  Administered 2021-07-08 – 2021-07-14 (×11): 10 mL

## 2021-07-07 MED ORDER — SODIUM CHLORIDE 0.9 % IV SOLN: INTRAVENOUS | Status: DC

## 2021-07-07 MED ORDER — FENTANYL CITRATE (PF) 250 MCG/5ML IJ SOLN
INTRAMUSCULAR | Status: AC
  Filled 2021-07-07: qty 5

## 2021-07-07 MED ORDER — PANTOPRAZOLE SODIUM 40 MG IV SOLR
40.0000 mg | Freq: Every day | INTRAVENOUS | Status: DC
  Administered 2021-07-08: 40 mg via INTRAVENOUS
  Filled 2021-07-07: qty 10

## 2021-07-07 MED ORDER — BUPIVACAINE-EPINEPHRINE 0.5% -1:200000 IJ SOLN
INTRAMUSCULAR | Status: DC | PRN
  Administered 2021-07-07: 10 mL

## 2021-07-07 MED ORDER — ROCURONIUM BROMIDE 10 MG/ML (PF) SYRINGE
PREFILLED_SYRINGE | INTRAVENOUS | Status: DC | PRN
  Administered 2021-07-07: 100 mg via INTRAVENOUS
  Administered 2021-07-07 (×3): 50 mg via INTRAVENOUS

## 2021-07-07 MED ORDER — 0.9 % SODIUM CHLORIDE (POUR BTL) OPTIME
TOPICAL | Status: DC | PRN
  Administered 2021-07-07: 1000 mL

## 2021-07-07 MED ORDER — VANCOMYCIN HCL 1250 MG/250ML IV SOLN
1250.0000 mg | INTRAVENOUS | Status: DC
  Filled 2021-07-07: qty 250

## 2021-07-07 MED ORDER — PROPOFOL 10 MG/ML IV BOLUS
INTRAVENOUS | Status: DC | PRN
  Administered 2021-07-07: 100 mg via INTRAVENOUS
  Administered 2021-07-07: 120 mg via INTRAVENOUS
  Administered 2021-07-07: 50 mg via INTRAVENOUS

## 2021-07-07 MED ORDER — POLYETHYLENE GLYCOL 3350 17 G PO PACK
17.0000 g | PACK | Freq: Every day | ORAL | Status: DC
  Administered 2021-07-09 – 2021-07-12 (×4): 17 g
  Filled 2021-07-07 (×4): qty 1

## 2021-07-07 MED ORDER — POLYETHYLENE GLYCOL 3350 17 G PO PACK: 17.0000 g | PACK | Freq: Every day | ORAL | Status: DC | PRN

## 2021-07-07 MED ORDER — NALOXONE HCL 0.4 MG/ML IJ SOLN
0.4000 mg | INTRAMUSCULAR | Status: DC | PRN
  Administered 2021-07-07: 0.4 mg via INTRAVENOUS
  Filled 2021-07-07: qty 1

## 2021-07-07 MED ORDER — BUPIVACAINE-EPINEPHRINE (PF) 0.25% -1:200000 IJ SOLN
INTRAMUSCULAR | Status: AC
  Filled 2021-07-07: qty 30

## 2021-07-07 MED ORDER — BACITRACIN ZINC 500 UNIT/GM EX OINT
TOPICAL_OINTMENT | CUTANEOUS | Status: AC
  Filled 2021-07-07: qty 28.35

## 2021-07-07 MED ORDER — FENTANYL CITRATE (PF) 250 MCG/5ML IJ SOLN
INTRAMUSCULAR | Status: DC | PRN
  Administered 2021-07-07 (×3): 50 ug via INTRAVENOUS
  Administered 2021-07-07: 100 ug via INTRAVENOUS
  Administered 2021-07-07: 50 ug via INTRAVENOUS

## 2021-07-07 MED ORDER — BACITRACIN ZINC 500 UNIT/GM EX OINT
TOPICAL_OINTMENT | CUTANEOUS | Status: DC | PRN
  Administered 2021-07-07: 1 via TOPICAL

## 2021-07-07 MED ORDER — FENTANYL CITRATE (PF) 100 MCG/2ML IJ SOLN
INTRAMUSCULAR | Status: AC
  Filled 2021-07-07: qty 2

## 2021-07-07 MED ORDER — PROPOFOL 1000 MG/100ML IV EMUL
0.0000 ug/kg/min | INTRAVENOUS | Status: DC
  Administered 2021-07-08: 10 ug/kg/min via INTRAVENOUS
  Filled 2021-07-07: qty 100

## 2021-07-07 MED ORDER — PHENYLEPHRINE HCL-NACL 20-0.9 MG/250ML-% IV SOLN
INTRAVENOUS | Status: DC | PRN
  Administered 2021-07-07: 25 ug/min via INTRAVENOUS

## 2021-07-07 MED ORDER — LORAZEPAM 2 MG/ML IJ SOLN
1.0000 mg | INTRAMUSCULAR | Status: DC | PRN
  Administered 2021-07-07 (×2): 1 mg via INTRAVENOUS
  Filled 2021-07-07 (×2): qty 1

## 2021-07-07 MED ORDER — POTASSIUM CHLORIDE IN NACL 40-0.9 MEQ/L-% IV SOLN
INTRAVENOUS | Status: DC
  Filled 2021-07-07 (×2): qty 1000

## 2021-07-07 MED ORDER — MORPHINE SULFATE (PF) 2 MG/ML IV SOLN
2.0000 mg | INTRAVENOUS | Status: DC | PRN
  Administered 2021-07-09: 4 mg via INTRAVENOUS
  Administered 2021-07-09: 2 mg via INTRAVENOUS
  Administered 2021-07-09 – 2021-07-10 (×3): 4 mg via INTRAVENOUS
  Filled 2021-07-07 (×4): qty 2
  Filled 2021-07-07: qty 1

## 2021-07-07 MED ORDER — THROMBIN 5000 UNITS EX SOLR
CUTANEOUS | Status: AC
  Filled 2021-07-07: qty 5000

## 2021-07-07 MED ORDER — THROMBIN 5000 UNITS EX SOLR
OROMUCOSAL | Status: DC | PRN
  Administered 2021-07-07: 5 mL via TOPICAL

## 2021-07-07 MED ORDER — FENTANYL CITRATE PF 50 MCG/ML IJ SOSY
50.0000 ug | PREFILLED_SYRINGE | INTRAMUSCULAR | Status: DC | PRN
  Administered 2021-07-07 – 2021-07-10 (×6): 50 ug via INTRAVENOUS
  Filled 2021-07-07 (×6): qty 1

## 2021-07-07 MED ORDER — MIDAZOLAM HCL 2 MG/2ML IJ SOLN
INTRAMUSCULAR | Status: AC
  Filled 2021-07-07: qty 2

## 2021-07-07 MED ORDER — DOCUSATE SODIUM 50 MG/5ML PO LIQD
100.0000 mg | Freq: Two times a day (BID) | ORAL | Status: DC
  Administered 2021-07-08 – 2021-07-13 (×9): 100 mg
  Filled 2021-07-07 (×10): qty 10

## 2021-07-07 MED ORDER — LORAZEPAM 2 MG/ML IJ SOLN: 0.5000 mg | INTRAMUSCULAR | Status: DC | PRN

## 2021-07-07 MED ORDER — LACTATED RINGERS IV SOLN: INTRAVENOUS | Status: DC | PRN

## 2021-07-07 MED ORDER — PHENYLEPHRINE 40 MCG/ML (10ML) SYRINGE FOR IV PUSH (FOR BLOOD PRESSURE SUPPORT)
PREFILLED_SYRINGE | INTRAVENOUS | Status: DC | PRN
Start: 2021-07-07 — End: 2021-07-07
  Administered 2021-07-07: 80 ug via INTRAVENOUS
  Administered 2021-07-07: 120 ug via INTRAVENOUS

## 2021-07-07 MED ORDER — PROPOFOL 10 MG/ML IV BOLUS
INTRAVENOUS | Status: AC
  Filled 2021-07-07: qty 20

## 2021-07-07 MED ORDER — FENTANYL CITRATE PF 50 MCG/ML IJ SOSY: 50.0000 ug | PREFILLED_SYRINGE | Freq: Once | INTRAMUSCULAR | Status: DC

## 2021-07-07 MED ORDER — FENTANYL 2500MCG IN NS 250ML (10MCG/ML) PREMIX INFUSION
50.0000 ug/h | INTRAVENOUS | Status: DC
  Administered 2021-07-08 – 2021-07-10 (×2): 50 ug/h via INTRAVENOUS
  Administered 2021-07-11: 75 ug/h via INTRAVENOUS
  Administered 2021-07-12: 150 ug/h via INTRAVENOUS
  Administered 2021-07-13: 25 ug/h via INTRAVENOUS
  Filled 2021-07-07 (×5): qty 250

## 2021-07-07 MED ORDER — SODIUM CHLORIDE 0.9% FLUSH: 10.0000 mL | INTRAVENOUS | Status: DC | PRN

## 2021-07-07 MED ORDER — ONDANSETRON HCL 4 MG/2ML IJ SOLN
4.0000 mg | Freq: Four times a day (QID) | INTRAMUSCULAR | Status: DC | PRN
  Administered 2021-07-09: 4 mg via INTRAVENOUS
  Filled 2021-07-07: qty 2

## 2021-07-07 MED ORDER — LIDOCAINE 2% (20 MG/ML) 5 ML SYRINGE
INTRAMUSCULAR | Status: DC | PRN
  Administered 2021-07-07: 100 mg via INTRAVENOUS

## 2021-07-07 MED ORDER — ONDANSETRON HCL 4 MG/2ML IJ SOLN: 4.0000 mg | Freq: Four times a day (QID) | INTRAMUSCULAR | Status: DC | PRN

## 2021-07-07 MED ORDER — SODIUM CHLORIDE 0.9 % IV SOLN
2.0000 g | Freq: Three times a day (TID) | INTRAVENOUS | Status: DC
  Administered 2021-07-07 – 2021-07-08 (×2): 2 g via INTRAVENOUS
  Filled 2021-07-07 (×2): qty 12.5

## 2021-07-07 MED ORDER — FENTANYL BOLUS VIA INFUSION
50.0000 ug | INTRAVENOUS | Status: DC | PRN
  Administered 2021-07-11: 50 ug via INTRAVENOUS
  Filled 2021-07-07: qty 100

## 2021-07-07 MED ORDER — GADOBUTROL 1 MMOL/ML IV SOLN
5.5000 mL | Freq: Once | INTRAVENOUS | Status: AC | PRN
  Administered 2021-07-07: 5.5 mL via INTRAVENOUS

## 2021-07-07 MED ORDER — ACETAMINOPHEN 650 MG RE SUPP
650.0000 mg | Freq: Four times a day (QID) | RECTAL | Status: DC | PRN
  Administered 2021-07-07: 650 mg via RECTAL
  Filled 2021-07-07: qty 1

## 2021-07-07 MED ORDER — ACETAMINOPHEN 325 MG PO TABS: 650.0000 mg | ORAL_TABLET | Freq: Four times a day (QID) | ORAL | Status: DC | PRN

## 2021-07-07 MED ORDER — POTASSIUM CHLORIDE 10 MEQ/100ML IV SOLN
10.0000 meq | INTRAVENOUS | Status: AC
  Administered 2021-07-07 (×4): 10 meq via INTRAVENOUS
  Filled 2021-07-07 (×4): qty 100

## 2021-07-07 MED ORDER — DOCUSATE SODIUM 50 MG/5ML PO LIQD
100.0000 mg | Freq: Two times a day (BID) | ORAL | Status: DC | PRN
  Administered 2021-07-11: 100 mg

## 2021-07-07 MED ORDER — THROMBIN 20000 UNITS EX SOLR
CUTANEOUS | Status: DC | PRN
  Administered 2021-07-07: 20 mL via TOPICAL

## 2021-07-07 NOTE — Significant Event (Addendum)
Rapid Response Event Note  ? ?Reason for Call :  ?Copious secretions, oxygen desaturation when attempted to lay flat for MRI- 87%. ? ?Initial Focused Assessment:  ?Pt lying in bed, obtunded. Copious, clear, frothy sputum suctioned from pt's mouth. Lung sounds are rhonchus throughout. Neck retraction accessory muscle use noted with pt's breathing. Pt does not respond to painful stimuli. However, she wakes up minimally to deep oral suctioning. Pupils initially were L>R, both reactive. With stimulation, pupils became equal and reactive. Skin is warm, dry.Pulses 2+.  ? ?VS: T 98.17F, BP 184/106, HR 106, RR 24 , SpO2 95% on 4.5L   ?CBG: 125 ? ?Interventions:  ?-Oral suctioning ?-Consult to PCCM per Dr. Lonny Prude ? ?Plan of Care:  ?Transfer to ICU ? ?Event Summary:  ?MD Notified: Dr. Lonny Prude ?Call Time: 1658 ?Arrival Time: 1700 ?End Time: 3419 - pt handed off to Anesthesia for MRI ? ?Casimer Bilis, RN ?

## 2021-07-07 NOTE — Progress Notes (Signed)
Patient is from home. Has a history of polysubstance abuse. Awaiting neurosurgery to see if going for surgery.  ?TOC following. ?

## 2021-07-07 NOTE — Anesthesia Preprocedure Evaluation (Addendum)
Anesthesia Evaluation  ? ?Patient confused ? ? ? ?Reviewed: ?Patient's Chart, lab work & pertinent test resultsPreop documentation limited or incomplete due to emergent nature of procedure. ? ?Airway ?Mallampati: Unable to assess ? ? ? ? ? ? Dental ? ?(+) Missing ?  ?Pulmonary ?asthma , COPD, Current Smoker and Patient abstained from smoking.,  ?  ?Pulmonary exam normal ? ? ? ? ? ? ? Cardiovascular ?negative cardio ROS ?Normal cardiovascular exam ? ? ?  ?Neuro/Psych ?PSYCHIATRIC DISORDERS Anxiety Depression Schizophrenia negative neurological ROS ?   ? GI/Hepatic ?negative GI ROS, (+)  ?  ? substance abuse ? , Hepatitis -  ?Endo/Other  ?negative endocrine ROS ? Renal/GU ?negative Renal ROS  ? ?  ?Musculoskeletal ? ? Abdominal ?  ?Peds ? Hematology ? ?(+) Blood dyscrasia, anemia ,   ?Anesthesia Other Findings ?thoracic lesion  ? Reproductive/Obstetrics ? ?  ? ? ? ? ? ? ? ? ? ? ? ? ? ?  ?  ? ? ? ? ? ? ? ?Anesthesia Physical ?Anesthesia Plan ? ?ASA: 4 and emergent ? ?Anesthesia Plan: General  ? ?Post-op Pain Management:   ? ?Induction: Intravenous ? ?PONV Risk Score and Plan: 2 and Ondansetron, Dexamethasone and Treatment may vary due to age or medical condition ? ?Airway Management Planned: Oral ETT ? ?Additional Equipment:  ? ?Intra-op Plan:  ? ?Post-operative Plan: Possible Post-op intubation/ventilation ? ?Informed Consent:  ? ? ? ?Only emergency history available ? ?Plan Discussed with: CRNA and Surgeon ? ?Anesthesia Plan Comments:   ? ? ? ? ? ?Anesthesia Quick Evaluation ? ?

## 2021-07-07 NOTE — Progress Notes (Signed)
PHARMACY - PHYSICIAN COMMUNICATION ?CRITICAL VALUE ALERT - BLOOD CULTURE IDENTIFICATION (BCID) ? ?Linda Cervantes is an 56 y.o. female who presented to [facility] on 07/14/2021 with a chief complaint of osteomyelitis. ? ? ? ?Name of physician (or Provider) ContactedLonny Prude ? ?Current antibiotics: vanco + cefepime ? ?Changes to prescribed antibiotics recommended:  ?Patient is on recommended antibiotics - No changes needed ? ?Results for orders placed or performed during the hospital encounter of 07/01/2021  ?Blood Culture ID Panel (Reflexed) (Collected: 07/24/2021 12:16 AM)  ?Result Value Ref Range  ? Enterococcus faecalis NOT DETECTED NOT DETECTED  ? Enterococcus Faecium NOT DETECTED NOT DETECTED  ? Listeria monocytogenes NOT DETECTED NOT DETECTED  ? Staphylococcus species DETECTED (A) NOT DETECTED  ? Staphylococcus aureus (BCID) DETECTED (A) NOT DETECTED  ? Staphylococcus epidermidis NOT DETECTED NOT DETECTED  ? Staphylococcus lugdunensis NOT DETECTED NOT DETECTED  ? Streptococcus species NOT DETECTED NOT DETECTED  ? Streptococcus agalactiae NOT DETECTED NOT DETECTED  ? Streptococcus pneumoniae NOT DETECTED NOT DETECTED  ? Streptococcus pyogenes NOT DETECTED NOT DETECTED  ? A.calcoaceticus-baumannii NOT DETECTED NOT DETECTED  ? Bacteroides fragilis NOT DETECTED NOT DETECTED  ? Enterobacterales NOT DETECTED NOT DETECTED  ? Enterobacter cloacae complex NOT DETECTED NOT DETECTED  ? Escherichia coli NOT DETECTED NOT DETECTED  ? Klebsiella aerogenes NOT DETECTED NOT DETECTED  ? Klebsiella oxytoca NOT DETECTED NOT DETECTED  ? Klebsiella pneumoniae NOT DETECTED NOT DETECTED  ? Proteus species NOT DETECTED NOT DETECTED  ? Salmonella species NOT DETECTED NOT DETECTED  ? Serratia marcescens NOT DETECTED NOT DETECTED  ? Haemophilus influenzae NOT DETECTED NOT DETECTED  ? Neisseria meningitidis NOT DETECTED NOT DETECTED  ? Pseudomonas aeruginosa NOT DETECTED NOT DETECTED  ? Stenotrophomonas maltophilia NOT DETECTED NOT DETECTED  ?  Candida albicans NOT DETECTED NOT DETECTED  ? Candida auris NOT DETECTED NOT DETECTED  ? Candida glabrata NOT DETECTED NOT DETECTED  ? Candida krusei NOT DETECTED NOT DETECTED  ? Candida parapsilosis NOT DETECTED NOT DETECTED  ? Candida tropicalis NOT DETECTED NOT DETECTED  ? Cryptococcus neoformans/gattii NOT DETECTED NOT DETECTED  ? Meth resistant mecA/C and MREJ DETECTED (A) NOT DETECTED  ? ? ?Einar Grad ?07/25/2021  5:45 PM ? ?

## 2021-07-07 NOTE — Anesthesia Procedure Notes (Addendum)
Central Venous Catheter Insertion ?Performed by: Murvin Natal, MD, anesthesiologist ?Start/End04/01/2022 9:30 PM, 07/03/2021 9:40 PM ?Patient location: OR. ?Emergency situation ?Preanesthetic checklist: patient identified, IV checked, monitors and equipment checked, pre-op evaluation and timeout performed ?Position: Trendelenburg ?Patient sedated ?Hand hygiene performed , maximum sterile barriers used  and Seldinger technique used ?Catheter size: 8 Fr ?Total catheter length 16. ?Central line was placed.Double lumen ?Procedure performed without using ultrasound guided technique. ?Attempts: 1 ?Following insertion, dressing applied, line sutured and Biopatch. ?Post procedure assessment: blood return through all ports, free fluid flow and no air ? ?Patient tolerated the procedure well with no immediate complications. ? ? ? ?

## 2021-07-07 NOTE — ED Notes (Signed)
[facility] called for transport to [facility].  ?

## 2021-07-07 NOTE — Assessment & Plan Note (Addendum)
Sepsis due to epidural abscess, POA ?Met sepsis criteria on admission including fever up to 100.8, tachypnea in the 20s and 30s, tachycardia in the low 100s, 18.6 K leukocytosis and confirmed source. ?MRI L-spine: Dorsal epidural abscess extending from T12-L1 and measuring 6 mm in greatest AP thickness at the L1 level.  Moderate mass effect on the conus medullaris and cauda equina.  Severe spinal canal stenosis at L3-L4 and L4-L5. ?Epidural abscess could be related to seeding from IVDA and bacteremia. ?Lactate normal.  Urine pregnancy test negative. ?Follow-up blood cultures. ?Requested TTE.  May need TEE. ?Treated with IV fluid bolus and continue empirically started IV vancomycin and cefepime. ?Awaiting transfer from [facility] to [facility] for evaluation by the neurosurgical team to determine need for surgery versus IR drainage.  When drained, samples to be sent off for culture sensitivity. ?Remains n.p.o. for procedure. ?We will consult ID. ?

## 2021-07-07 NOTE — Op Note (Signed)
Brief history: The patient is a 56 year old white female with a history of injuries for drug abuse who presented to the ER with a dense paraparesis.  She was worked up with a lumbar MRI which demonstrated findings consistent with a lumbar epidural abscess.  She was transferred to [facility] for further management.  She was further worked up with a thoracic MRI which demonstrated she had a holothoracic epidural abscess.  By this time the patient was paraplegic and obtunded.  She was intubated.  I discussed the situation with the patient's son and recommended surgery.  He consented on the patient's behalf. ? ?Preop diagnosis: Thoracic and lumbar epidural abscess, paraplegia ? ?Postop diagnosis: The same ? ?Procedure: Left T2-3, T3-4, T4-5, T5-6, T6-7, T7-8, T8-9, T9-10 and T10-11 laminotomy for evacuation of epidural abscess ? ?Surgeon: Dr. Earle Gell ? ?Assistant: Arnetha Massy, NP ? ?Anesthesia: General tracheal ? ?Estimated blood loss: 125 cc ? ?Specimens: Cultures ? ?Drains: 1 medium Hemovac ? ?Complications: None ? ?Description of procedure: The patient was brought to the operating room by the anesthesia team.  General endotracheal anesthesia was induced.  I then applied the [place] three-point headrest to the patient calvarium.  She was carefully turned to the prone position on the chest rolls.  Her arms were tucked by her side.  The patient's thoracolumbar region was then prepared with Betadine scrub and Betadine solution.  Sterile drapes were applied.  I then injected the area to be incised with Marcaine with epinephrine solution. ? ?I then used the scalpel to make a linear midline incision from approximately T2-3 to T11-12.  I used electrocautery to perform a left sided subperiosteal dissection exposing the left spinous process and lamina from T2-T12.  We confirmed our location with intraoperative fluoroscopy.  I used the cerebellar and Geologist, engineering for exposure.  I then used a high-speed  drill to perform a left T2-3, T3-4, T4-5, T5-6, T6-7, T7-8, T8-9, T9-10 and T10-11 laminotomy.  I remove the ligamentum flavum at these levels.  We encountered epidural purulent material/pus.  We cultured this.  It was also some granulation tissue in the epidural space.  I carefully dissected the annulus and tissue and pus from the dura decompressing the thecal sac at these levels. ? ?We then obtained hemostasis using bipolar cautery.  We irrigated the wound out with saline solution.  I then remove the retractors.  We placed a medium Hemovac drain from proxy to T2-T12 on the left.  We tunneled out through a separate stab wound.  We then reapproximated the patient's thoracic fascia with interrupted 0 Vicryl suture.  We reapproximated the subcutaneous sutures with her up to 0 Vicryl suture.  We have approximated skin with Steri-Strips and benzoin.  The wound was then coated with bacitracin ointment.  A sterile dressing was applied.  The drapes were removed.  The patient was separately returned to the supine position.  I then remove the [place] three-point headrest from her calvarium.  By report all sponge, instrument, and needle counts were correct at the end this case. ?

## 2021-07-07 NOTE — Progress Notes (Signed)

## 2021-07-07 NOTE — Consult Note (Addendum)
Reason for Consult: Spinal epidural abscess ?Referring Physician: Dr. Algis Liming ? ?Linda Cervantes is an 56 y.o. female.  ?HPI: The patient is a 56 year old white female with a history of intravenous drug abuse, schizophrenia, depression, etc.  She was seen in the ER on 03/28/2021 with back pain.  MRI scan of the lumbar spine at that time was relatively unremarkable. ? ?She presented to the ER complaining of back pain and urinary retention.  A Foley catheter was placed with 900 cc of urine.  She had a lumbar MRI which demonstrated a moderate lumbar epidural abscess.  A neurosurgical consultation was requested.  The patient was admitted by the hospitalist.  I recommend the patient be transferred to [facility] for further observation and management.  We are awaiting a bed at [facility]. ? ?By report the patient had some lower extremity weakness but was able to move her legs.  She was agitated and pulled out several IVs.  She was sedated with Ativan and fentanyl. ? ?Presently the patient is heavily sedated and I can barely awaken her. ? ?Past Medical History:  ?Diagnosis Date  ? Anxiety   ? Asthma   ? Back pain   ? Depression   ? Endometriosis   ? GERD (gastroesophageal reflux disease)   ? Heart murmur   ? 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 Paranoid schizophrenia ([place])   ? Substance abuse ([place]) Clean as of 2009  ? Crack cocaine  ? ? ?Past Surgical History:  ?Procedure Laterality Date  ? CESAREAN SECTION    ? FOOT SURGERY    ? INCISION AND DRAINAGE OF WOUND Right 05/22/2018  ? Procedure: IRRIGATION AND DEBRIDEMENT RIGHT ARM WITH REMOVAL FOREIGN BODY;  Surgeon: [name], MD;  Location: [place];  Service: Orthopedics;  Laterality: Right;  ? TUBAL LIGATION  2000  ? ? ?Family History  ?Problem Relation Age of Onset  ? Hypertension Father   ? Valvular heart disease Father   ? Heart Problems Sister   ? Diabetes Maternal Grandmother   ? Rheumatic fever Paternal Uncle   ?  Asthma Child   ? Colon cancer Neg Hx   ? Esophageal cancer Neg Hx   ? Pancreatic cancer Neg Hx   ? Stomach cancer Neg Hx   ? ? ?Social History:  reports that she has been smoking cigarettes. She has a 40.00 pack-year smoking history. She has been exposed to tobacco smoke. She has never used smokeless tobacco. She reports that she does not currently use drugs after having used the following drugs: Cocaine, Heroin, and IV. She reports that she does not drink alcohol. ? ?Allergies:  ?Allergies  ?Allergen Reactions  ? Amitriptyline Hcl Other (See Comments)  ?  Face Swelling  ? ? ?Medications: I have reviewed the patient's current medications. ?Prior to Admission: (Not in a hospital admission)  ?Scheduled: ? sodium chloride flush  10-40 mL Intracatheter Q12H  ? ?Continuous: ? sodium chloride    ? 0.9 % NaCl with KCl 40 mEq / L 75 mL/hr at 07/11/2021 1047  ? ceFEPime (MAXIPIME) IV Stopped (07/30/2021 0810)  ? vancomycin    ? ?[id number] **OR** acetaminophen, fentaNYL (SUBLIMAZE) injection, LORazepam, naLOXone (NARCAN)  injection, ondansetron (ZOFRAN) IV, sodium chloride flush ?Anti-infectives (From admission, onward)  ? ? Start     Dose/Rate Route Frequency Ordered Stop  ? 07/19/2021 2200  vancomycin (VANCOREADY) IVPB 1250 mg/250 mL       ? 1,250 mg ?166.7 mL/hr over  90 Minutes Intravenous Every 24 hours 07/14/2021 0123    ? 07/28/2021 0800  ceFEPIme (MAXIPIME) 2 g in sodium chloride 0.9 % 100 mL IVPB       ? 2 g ?200 mL/hr over 30 Minutes Intravenous Every 8 hours 07/13/2021 0150    ? 07/09/2021 2230  vancomycin (VANCOCIN) IVPB 1000 mg/200 mL premix       ? 1,000 mg ?200 mL/hr over 60 Minutes Intravenous  Once 07/23/2021 2215 07/19/2021 0236  ? 07/16/2021 2215  ceFEPIme (MAXIPIME) 2 g in sodium chloride 0.9 % 100 mL IVPB       ? 2 g ?200 mL/hr over 30 Minutes Intravenous  Once 07/14/2021 2206 07/04/2021 0122  ? ?  ? ? ? ?Results for orders placed or performed during the hospital encounter of 07/09/2021 (from the past 48 hour(s))   ?Comprehensive metabolic panel     Status: Abnormal  ? Collection Time: 07/23/2021  7:28 PM  ?Result Value Ref Range  ? Sodium 130 (L) 135 - 145 mmol/L  ? Potassium 3.2 (L) 3.5 - 5.1 mmol/L  ? Chloride 93 (L) 98 - 111 mmol/L  ? CO2 22 22 - 32 mmol/L  ? Glucose, Bld 119 (H) 70 - 99 mg/dL  ?  Comment: Glucose reference range applies only to samples taken after fasting for at least 8 hours.  ? BUN 21 (H) 6 - 20 mg/dL  ? Creatinine, Ser 0.67 0.44 - 1.00 mg/dL  ? Calcium 9.8 8.9 - 10.3 mg/dL  ? Total Protein 9.0 (H) 6.5 - 8.1 g/dL  ? Albumin 4.1 3.5 - 5.0 g/dL  ? AST 53 (H) 15 - 41 U/L  ? ALT 53 (H) 0 - 44 U/L  ? Alkaline Phosphatase 85 38 - 126 U/L  ? Total Bilirubin 1.1 0.3 - 1.2 mg/dL  ? GFR, Estimated >60 >60 mL/min  ?  Comment: (NOTE) ?Calculated using the CKD-EPI Creatinine Equation (2021) ?  ? Anion gap 15 5 - 15  ?  Comment: Performed at [facility], [place] [address], [place], [place] [address]  ?CBC with Differential     Status: Abnormal  ? Collection Time: 07/04/2021  7:28 PM  ?Result Value Ref Range  ? WBC 20.6 (H) 4.0 - 10.5 K/uL  ? RBC 5.16 (H) 3.87 - 5.11 MIL/uL  ? Hemoglobin 13.7 12.0 - 15.0 g/dL  ? HCT 40.4 36.0 - 46.0 %  ? MCV 78.3 (L) 80.0 - 100.0 fL  ? MCH 26.6 26.0 - 34.0 pg  ? MCHC 33.9 30.0 - 36.0 g/dL  ? RDW 13.9 11.5 - 15.5 %  ? Platelets 235 150 - 400 K/uL  ? nRBC 0.0 0.0 - 0.2 %  ? Neutrophils Relative % 83 %  ? Neutro Abs 17.2 (H) 1.7 - 7.7 K/uL  ? Lymphocytes Relative 6 %  ? Lymphs Abs 1.2 0.7 - 4.0 K/uL  ? Monocytes Relative 10 %  ? Monocytes Absolute 2.0 (H) 0.1 - 1.0 K/uL  ? Eosinophils Relative 0 %  ? Eosinophils Absolute 0.0 0.0 - 0.5 K/uL  ? Basophils Relative 0 %  ? Basophils Absolute 0.0 0.0 - 0.1 K/uL  ? Immature Granulocytes 1 %  ? Abs Immature Granulocytes 0.12 (H) 0.00 - 0.07 K/uL  ?  Comment: Performed at [facility], [place] [address], [place], [place] [address]  ?Lipase, blood     Status: None  ? Collection Time: 07/21/2021  7:28 PM  ?Result Value  Ref Range  ?  Lipase 27 11 - 51 U/L  ?  Comment: Performed at [facility], [place] [address], [place], [place] [address]  ?Sedimentation rate     Status: Abnormal  ? Collection Time: 07/03/2021  7:28 PM  ?Result Value Ref Range  ? Sed Rate 54 (H) 0 - 22 mm/hr  ?  Comment: Performed at [facility], [place] [address], [place], [place] [address]  ?Urinalysis, Routine w reflex microscopic Urine, In & Out Cath     Status: Abnormal  ? Collection Time: 07/11/2021  7:29 PM  ?Result Value Ref Range  ? Color, Urine YELLOW YELLOW  ? APPearance CLEAR CLEAR  ? Specific Gravity, Urine 1.016 1.005 - 1.030  ? pH 6.0 5.0 - 8.0  ? Glucose, UA NEGATIVE NEGATIVE mg/dL  ? Hgb urine dipstick SMALL (A) NEGATIVE  ? Bilirubin Urine NEGATIVE NEGATIVE  ? Ketones, ur 20 (A) NEGATIVE mg/dL  ? Protein, ur 100 (A) NEGATIVE mg/dL  ? Nitrite NEGATIVE NEGATIVE  ? Leukocytes,Ua NEGATIVE NEGATIVE  ? RBC / HPF 6-10 0 - 5 RBC/hpf  ? WBC, UA 0-5 0 - 5 WBC/hpf  ? Bacteria, UA RARE (A) NONE SEEN  ? Squamous Epithelial / LPF 0-5 0 - 5  ? Mucus PRESENT   ?  Comment: Performed at [facility], [place] [address], [place], [place] [address]  ?Sodium, urine, random     Status: None  ? Collection Time: 07/24/2021  7:29 PM  ?Result Value Ref Range  ? Sodium, Ur 114 mmol/L  ?  Comment: Performed at [facility], [place] [address], [place], [place] [address]  ?Creatinine, urine, random     Status: None  ? Collection Time: 07/25/2021  7:29 PM  ?Result Value Ref Range  ? Creatinine, Urine 59.24 mg/dL  ?  Comment: Performed at [facility], [place] [address], [place], [place] [address]  ?Osmolality, urine     Status: None  ? Collection Time: 07/19/2021  7:29 PM  ?Result Value Ref Range  ? Osmolality, Ur 599 300 - 900 mOsm/kg  ?  Comment: Performed at [facility], [place] [address] [place], [place] [address]  ?Rapid urine drug screen (hospital performed)     Status: Abnormal  ? Collection Time:  07/28/2021  7:30 PM  ?Result Value Ref Range  ? Opiates NONE DETECTED NONE DETECTED  ? Cocaine POSITIVE (A) NONE DETECTED  ? Benzodiazepines NONE DETECTED NONE DETECTED  ? Amphetamines NONE DETECTED NONE DETECTED  ? T

## 2021-07-07 NOTE — Progress Notes (Addendum)
Patient evaluated at bedside. Appears to be stable from prior evaluation. Patient with decreased interaction, but answers questions appropriately with head nods. Mumbles when she tries to speak. Does not move extremities when asked. Trouble controlling secretions. CT head unremarkable for etiology. Possibly this presentation could be secondary to metabolic encephalopathy. Discussed with son and friend on telephone. At baseline, patient is very interactive/conversant/ambulatory. Current presentation is completely new. ?Per son, patient has two daughters, but does not keep in touch with them. ?-Add an MRI brain ? ?Cordelia Poche, MD ?[facility] ?07/15/2021, 3:29 PM ?

## 2021-07-07 NOTE — Consult Note (Signed)
?   ? ? ? ? ?[place] for Infectious Disease   ? ?Date of Admission:  07/04/2021   Total days of inpatient antibiotics 1 ? ?      ?Reason for Consult: Epidural abscess    ?Principal Problem: ?  Epidural abscess ?Active Problems: ?  Sepsis ([place]) ?  Acute encephalopathy ?  Hypokalemia ?  Acute hyponatremia ?  Polysubstance abuse ([place]) ?  Microcytic anemia ?  Acute urinary retention ?  Leukocytosis ? ? ?Assessment: ?56 YF with IVDA presented with low back pain and urinary retention found to have epidural abscess. Somnolent after receiving versed for imaging. Transferred form [facility] ED to [facility] for further evaluation. ? ?#Epidural abscess  ?#IVDA(last injected cocaine, day prior to admission) ?#HCV-chronic ?#Urinary retention with scan+900cc at [facility] ED. UA with negative nitrites and leukocytes ?-CXR showed no active disease. MR lumbar spine showed T12-L2 epidural abscess measuring up to 70m. Moderate mass effect on sonus medullaris and causa equina.  ?- [facility] exam limited due to fentanyl and ativan. [facility] concern for spinal compression. MRI thoracic pending. ?-Started on IV antibiotics. She has course breath sounds. 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  ? ?Recommendations:  ?-Continue vancomycin and cefepime ?-Obtain CT chest ?-HCV Vl, HIV, RPR, GC urine ?-Follow blood Cx ?-Repeat blood Cx x2 ?-Follow-up TTE ?-Follow-up MRI thoracic ?-Follow-up NSY recommendations ? ?Microbiology:   ?Antibiotics: ?Vancomycin 4/6-p ?Cefepime 4/6-p ? ?Cultures: ?Blood ?4/6 1./1 pending ? ? ? ?HPI: Linda LOCHNERis a 56y.o. female with IVDA, chronic HCV, anxiety, depression, paranoid schizophrenia [facility]. She received  Versed for imaging, and has been somnolent. I was unable to obtain history. In the Ed vital signs stable. Wbc 20K with MRI l spine showed 673mT12-L2 epidural abscess. Found to have 900cc of urine on bladder scan SP catheter. [facility]. ?Pt is unable to provide history as she is somnolent.  ? ? ?Review of Systems: ?Review of Systems  ?Unable to perform ROS: Other  ? ?Past Medical History:  ?Diagnosis Date  ? Anxiety   ? Asthma   ? Back pain   ? Depression   ? Endometriosis   ? GERD (gastroesophageal reflux disease)   ? Heart murmur   ? 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 Paranoid schizophrenia ([facility]  ? [place]stance [place] of [place]  ? Crack cocaine  ? ? ?Social History  ? ?Tobacco Use  ? Smoking status: Every Day  ?  Packs/day: 1.00  ?  Years: 40.00  ?  Pack years: 40.00  ?  Types: Cigarettes  ?  Passive exposure: Current  ? Smokeless tobacco: Never  ?Vaping Use  ? Vaping Use: Never used  ?Substance Use Topics  ? Alcohol use: No  ? Drug use: Not Currently  ?  Types: Cocaine, Heroin, IV  ?  Comment: 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  ? ? ?Family History  ?Problem Relation Age of Onset  ? Hypertension Father   ? Valvular heart disease Father   ? Heart Problems Sister   ? Diabetes Maternal  Grandmother   ? Rheumatic fever Paternal Uncle   ? Asthma Child   ? Colon cancer Neg Hx   ? Esophageal cancer Neg Hx   ? Pancreatic cancer Neg Hx   ? Stomach cancer Neg Hx   ? ?Scheduled Meds: ? sodium chloride flush  10-40 mL Intracatheter Q12H  ? ?Continuous Infusions: ? 0.9 % NaCl with KCl 40 mEq / L 75 mL/hr at 07/11/2021 1047  ? ceFEPime (MAXIPIME) IV Stopped (07/09/2021 0810)  ? vancomycin    ? ?PRN Meds:.acetaminophen **OR** acetaminophen, fentaNYL (SUBLIMAZE) injection, LORazepam, naLOXone (NARCAN)  injection, ondansetron (ZOFRAN) IV, sodium chloride flush ?Allergies  ?Allergen Reactions  ? Amitriptyline Hcl Swelling  and Other (See Comments)  ?  Face Swelling  ? Tylenol [Acetaminophen] Other (See Comments)  ?  Reaction not recalled by [name]  ? ? ?OBJECTIVE: ?Blood pressure (!) 161/81, pulse 95, temperature 97.9 ?F (36.6 ?C), temperature source Axillary, resp. rate 20, height '5\' 3"'$  (1.6 m), weight 54.4 kg, SpO2 100 %. ? ?Physical Exam ?Constitutional:   ?   Comments: somnolent  ?HENT:  ?   Head: Normocephalic and atraumatic.  ?   Nose: Nose normal.  ?   Mouth/Throat:  ?   Mouth: Mucous membranes are moist.  ?Cardiovascular:  ?   Rate and Rhythm: Normal rate and regular rhythm.  ?   Heart sounds: No murmur heard. ?  No friction rub. No gallop.  ?Pulmonary:  ?   Effort: Pulmonary effort is normal.  ?   Comments: Coarse breath sounds throughout ?Abdominal:  ?   General: Abdomen is flat.  ?   Palpations: Abdomen is soft.  ?Psychiatric:     ?   Mood and Affect: Mood normal.  ? ? ?Lab Results ?Lab Results  ?Component Value Date  ? WBC 18.6 (H) 07/05/2021  ? HGB 11.5 (L) 07/20/2021  ? HCT 34.9 (L) 07/28/2021  ? MCV 78.6 (L) 07/02/2021  ? PLT 214 07/25/2021  ?  ?Lab Results  ?Component Value Date  ? CREATININE 0.62 07/20/2021  ? BUN 15 07/27/2021  ? NA 129 (L) 07/26/2021  ? K 3.4 (L) 07/15/2021  ? CL 94 (L) 07/13/2021  ? CO2 24 07/09/2021  ?  ?Lab Results  ?Component Value Date  ? ALT 37 07/04/2021  ? AST 29 07/14/2021  ? GGT 10 06/20/2017  ? ALKPHOS 69 07/27/2021  ? BILITOT 0.7 07/03/2021  ?  ? ? ? ?Laurice Record, MD ?[facility] for Infectious Disease ?[facility] ?07/19/2021, 2:53 PM  ?

## 2021-07-07 NOTE — ED Notes (Signed)
Pt was found removed all VS monitoring, had pulled out IV, removed foley securing device, and undressed herself. Is now alert but confused to surrounding. Pt reconnect to VS, new linen provided, Safety mittens placed and IVF paused. New order for IV team placed for IV access pending response, called with busy line. Admitting provider informed of events.  ?

## 2021-07-07 NOTE — ED Notes (Signed)
Linda Cervantes (son) updated by this RN. He is requesting an update upon transfer to [facility]. Inpatient RN notified.  ? ?Cell phone #: [phone] ?

## 2021-07-07 NOTE — Progress Notes (Signed)
I have reviewed the partial images of the patient's thoracic MRI.  She appears to have a holothoracic epidural abscess.  The abscess may also continue into her cervical spine.  She has evidence of thoracic spinal cord signal change , possible infarction .  She also has a chronic appearing T8 fracture versus segmentation defect.    I do not see any masses on her brain MRI.   I called the OR and posted her for an emergency thoracic laminectomy. ? ?I have discussed this with the patient's son, Ysidro Evert.  We discussed the various treatment options including doing nothing ,medical management with  antibiotics, and surgery.  We discussed the surgical option of a thoracic laminectomy to drain her epidural abscess.  I told him I was a bit pessimistic that this would help but I think her chances of recovery are 0 without surgery.  I have answered all his questions.  He wants to proceed with surgery.  He consents on behalf of his mother.  ?

## 2021-07-07 NOTE — ED Notes (Signed)
PT remains agitated despite Fentanyl administration. Talking to people who are not there are screaming for help. Re-oriented. Reports taking IV cocaine, denies other alcohol or drug use.  ?

## 2021-07-07 NOTE — Progress Notes (Signed)
RT at bedside to obtain ABG. Per rapid response we will hold off on ABG at this time. Pt will be transferred to 4N. Obtain ABG at a later time. ?

## 2021-07-07 NOTE — Progress Notes (Addendum)
Pharmacy Antibiotic Note ? ?Linda Cervantes is a 56 y.o. female admitted on 07/01/2021 with  significant history of IV drug use presenting complaining of back pain and acute urinary retention for the past 24 hours.  Lumbar spine MRI demonstrate a dorsal epidural abscess extending from T12-L2.  Pharmacy has been consulted for vancomycin and cefepime dosing. ? ?Plan: ?Vancomycin 1gm IV x 1 then '1250mg'$  q24h (AUC 529.8, used Scr 0.8) ?Cefepime 2gm IV q8h ?Follow renal function, cultures and clinical course ? ?Height: '5\' 3"'$  (160 cm) ?Weight: 54.4 kg (120 lb) ?IBW/kg (Calculated) : 52.4 ? ?Temp (24hrs), Avg:98.2 ?F (36.8 ?C), Min:97.6 ?F (36.4 ?C), Max:99.3 ?F (37.4 ?C) ? ?Recent Labs  ?Lab 07/19/2021 ?1928 07/13/2021 ?1945  ?WBC 20.6*  --   ?CREATININE 0.67  --   ?LATICACIDVEN  --  1.9  ?  ?Estimated Creatinine Clearance: 67.3 mL/min (by C-G formula based on SCr of 0.67 mg/dL).   ? ?Allergies  ?Allergen Reactions  ? Amitriptyline Hcl Other (See Comments)  ?  Face Swelling  ? ? ?Antimicrobials this admission: ?Vanc 4/7 >> ?Cefepime 4/7 >> ? ?Dose adjustments this admission: ? ? ?Microbiology results: ?4/7 BCx:  ? ? ?Thank you for allowing pharmacy to be a part of this patient?s care. ? ?Dolly Rias RPh ?07/09/2021, 12:29 AM ? ?

## 2021-07-07 NOTE — H&P (Signed)
?History and Physical  ? ? ?PLEASE NOTE THAT DRAGON DICTATION SOFTWARE WAS USED IN THE CONSTRUCTION OF THIS NOTE. ? ? ?Linda Cervantes SFS:[id number] DOB: 1965-09-22 DOA: 07/18/2021 ? ?PCP: Vevelyn Francois, NP  ?Patient coming from: home  ? ?I have personally briefly reviewed patient's old medical records in [place] ? ?Chief Complaint: Low back pain ? ?HPI: Linda Cervantes is a 56 y.o. female with medical history significant for polysubstance abuse, including history of IV drug abuse use, who is admitted to [facility] on 07/20/2021 with sepsis due to epidural abscess after presenting from home to [facility] ED complaining of low back pain.  ? ?At the time of my encounter with her, patient somnolent after receiving Versed in preparation imaging studies..  History is limited this time.  Therefore, the following history is provided via my discussions with the EDP as well as via chart review. ? ?The patient reports 1 to 2 days of new onset midline low back discomfort without preceding trauma.  She reports associated difficulty with passage of urine, noting urinary urgency over that timeframe.  She reports potential weakness involving the bilateral lower remedies, but in symmetrical fashion, without any report of acute focal weakness.  Denies any new onset focal sensory changes, including no paresthesias or numbness.  ? ?Per chart review, patient has a history of substance abuse, including use of IV heroin as well as cocaine.  On methadone. ? ?Not on any blood thinners. ? ? ? ?ED Course:  ?Vital signs in the ED were notable for the following: Temperature max 99.3; heart rate 85-1 05; blood pressure 123/89; respiratory rate 16-23, oxygen saturation 97 to 100% on room air. ? ?Labs were notable for the following: CMP notable for the following: Sodium 130 compared to most recent prior serum sodium data point of 140 on 04/24/2021, potassium 3.2, chloride 93, creatinine 0.67, BUN to creatinine ratio greater than 20,  glucose 119; albumin 4.1, phosphatase 85, AST and ALT 53, CBC notable for white blood cell count 20,600 with 83% neutrophils.  Initial lactate 1.9, with repeat value trending down to 1.1.  ESR 54.  Urinalysis showed no blood cells, leukocyte Estrace/nitrate negative.  Serum ethanol level less than 10.  Urinary drug screen positive for cocaine.  COVID-19/[place] PCR negative.  Blood cultures x2 were collected prior to initiation of IV antibiotics. ? ?Imaging and additional notable ED work-up: Chest x-ray showed no evidence of acute cardiopulmonary process.  CT head, interpretation of which was limited by the presence of motion artifact limitations, appeared to demonstrate evidence of a acute intracranial process, including no evidence of intracranial hemorrhage or acute infarct.  MRI L-spine with and without contrast showed dorsal epidural abscess extending from T12-L2 and measuring 6 mm in greatest thickness at L1 with moderate mass effect on conus medullaris as well as, in addition to severe spinal canal stenosis at L3-L4 as well as L4-L5 due to combination of degenerative disc disease and congenitally narrow spinal canal. ? ?Postvoid residual urine scan demonstrated 900 cc's urine. ? ?EDP discussed the patient's case/imaging with neurosurgery, Reinaldo Meeker, working with Dr. Arnoldo Morale, with ensuing recommendations to admit to the hospitalist service at [facility] for further evaluation/ management presenting epidural abscess.  Neurosurgery to formally consult, with additional recs pending at this time; No current recommendation for initiation of systemic corticosteroids at this time.  ? ?While in the ED, the following were administered: Dilaudid 1 mg IV x1, Versed 10 mg IV x1, cefepime, IV vancomycin,  lactated Ringer's times 1,632 ml (30 mL/kg).  ? ?Subsequently, the patient was admitted to [facility] for further evaluation management of presenting sepsis due to epidural abscess, with additional presenting labs notable for  acute hypoosmolar hyponatremia as hypokalemia.  ? ? ?Review of Systems: As per HPI otherwise 10 point review of systems negative.  ? ?Past Medical History:  ?Diagnosis Date  ? Anxiety   ? Asthma   ? Back pain   ? Depression   ? Endometriosis   ? GERD (gastroesophageal reflux disease)   ? Heart murmur   ? 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 Paranoid schizophrenia ([place])   ? Substance abuse ([place]) Clean as of 2009  ? Crack cocaine  ? ? ?Past Surgical History:  ?Procedure Laterality Date  ? CESAREAN SECTION    ? FOOT SURGERY    ? INCISION AND DRAINAGE OF WOUND Right 05/22/2018  ? Procedure: IRRIGATION AND DEBRIDEMENT RIGHT ARM WITH REMOVAL FOREIGN BODY;  Surgeon: [name], MD;  Location: [place];  Service: Orthopedics;  Laterality: Right;  ? TUBAL LIGATION  2000  ? ? ?Social History: ? reports that she has been smoking cigarettes. She has a 40.00 pack-year smoking history. She has been exposed to tobacco smoke. She has never used smokeless tobacco. She reports that she does not currently use drugs after having used the following drugs: Cocaine, Heroin, and IV. She reports that she does not drink alcohol. ? ? ?Allergies  ?Allergen Reactions  ? Amitriptyline Hcl Other (See Comments)  ?  Face Swelling  ? ? ?Family History  ?Problem Relation Age of Onset  ? Hypertension Father   ? Valvular heart disease Father   ? Heart Problems Sister   ? Diabetes Maternal Grandmother   ? Rheumatic fever Paternal Uncle   ? Asthma Child   ? Colon cancer Neg Hx   ? Esophageal cancer Neg Hx   ? Pancreatic cancer Neg Hx   ? Stomach cancer Neg Hx   ? ? ?Family history reviewed and not pertinent  ? ? ?Prior to Admission medications   ?Medication Sig Start Date End Date Taking? Authorizing Provider  ?albuterol (VENTOLIN HFA) 108 (90 Base) MCG/ACT inhaler Inhale 2 puffs into the lungs every 6 (six) hours as needed for wheezing (wheezing). 10/20/20   Vevelyn Francois, NP  ?benzonatate (TESSALON) 100 MG capsule Take 1  capsule (100 mg total) by mouth 2 (two) times daily as needed for cough. 03/06/21   Bo Merino I, NP  ?cetirizine (ZYRTEC) 10 MG tablet Take 1 tablet (10 mg total) by mouth daily. 10/20/20   Vevelyn Francois, NP  ?FLUoxetine (PROZAC) 20 MG capsule Take 1 capsule (20 mg total) by mouth daily. 06/06/21 06/06/22  Nwoko, Terese Door, PA  ?fluticasone (FLONASE) 50 MCG/ACT nasal spray Place 1 spray into both nostrils daily. 04/28/21 10/25/21  Vevelyn Francois, NP  ?gabapentin (NEURONTIN) 300 MG capsule Take 1 capsule (300 mg total) by mouth 3 (three) times daily. 10/20/20   Vevelyn Francois, NP  ?ibuprofen (ADVIL) 600 MG tablet Take 1 tablet (600 mg total) by mouth every 6 (six) hours as needed. 04/24/21   Vevelyn Francois, NP  ?ipratropium-albuterol (DUONEB) 0.5-2.5 (3) MG/3ML SOLN Take 3 mLs by nebulization every 2 (two) hours as needed (shortness of breath/ cough). 05/19/21 08/17/21  Bo Merino I, NP  ?lidocaine (XYLOCAINE) 2 % solution Use as directed 15 mLs in the mouth or throat every 6 (six) hours as needed for mouth  pain. 04/24/21 08/02/21  Vevelyn Francois, NP  ?meclizine (ANTIVERT) 25 MG tablet Take 1 tablet (25 mg total) by mouth 3 (three) times daily as needed for dizziness. 10/20/20   Vevelyn Francois, NP  ?METHADONE HCL PO Take 1 Dose by mouth daily.    [provider]  ?methocarbamol (ROBAXIN) 500 MG tablet Take 1 tablet (500 mg total) by mouth at bedtime as needed for muscle spasms. 03/20/21   LampteyMyrene Galas, MD  ?mirtazapine (REMERON) 7.5 MG tablet Take 1 tablet (7.5 mg total) by mouth at bedtime. 06/06/21   Nwoko, Terese Door, PA  ?OLANZapine (ZYPREXA) 20 MG tablet Take 1 tablet (20 mg total) by mouth at bedtime. 06/06/21   Malachy Mood, PA  ? ? ? ?Objective  ? ? ?Physical Exam: ?Vitals:  ? 07/04/2021 2330 07/01/2021 0100 07/23/2021 0123 07/13/2021 0158  ?BP: (!) 169/90  123/89   ?Pulse: 89 86 94 95  ?Resp:  (!) 35 (!) 23 (!) 26  ?Temp:      ?TempSrc:      ?SpO2: 100% 94% 93% 97%  ?Weight:      ?Height:       ? ? ?General: appears to be stated age; somnolent, will briefly open eyes to verbal stimuli, before falling back asleep; nonverbal, unable to follow instructions at this time  ?Skin: warm, dry, no rash ?Head:  A

## 2021-07-07 NOTE — Assessment & Plan Note (Addendum)
Serum sodium in the 130-129 range.  Normal earlier this year. ?May be related to dehydration/hypovolemia. ?IV normal saline hydration and follow-up BMP in AM. ?If does not improve with hydration or if worsens, then need to consider SIADH. ?

## 2021-07-07 NOTE — Hospital Course (Addendum)
56 year old female with medical history significant for polysubstance abuse, including IVDA, asthma, GERD, anxiety and depression, paranoid schizophrenia, presented to the [facility] ED on 07/03/2021 with complaints of 1 to 2 days of new onset midline low back pain with associated difficulty urinating, urinary urgency, possible weakness of lower extremities.  Admitted for sepsis due to epidural abscess.  Neurosurgery was consulted and recommended transferring to [facility] for further evaluation and management by them.  Awaiting bed for transfer.  I have reached out to the patient placement RNs a couple times since early this morning to expedite transfer. ?

## 2021-07-07 NOTE — OR Nursing (Signed)
All documentation in OR record was done by Roselyn Reef AydeletteRN ?

## 2021-07-07 NOTE — Progress Notes (Signed)
Pt brought to MRI for exam. Pt appeared to be heavy breathing when laid flat. Pt required to lay flat for exam. Not safe to scan pt without at least RN monitoring due to pt not being A/O. Contacted RN. RN came to MRI and assessed pt. Pt put on monitoring. Sats in the mid 80s even while on 4L O2. It was determined pt could not safely lay flat for exam. RN called rapid response team and pt was transported back to room by RN and tech extender where rapid would meet. ?

## 2021-07-07 NOTE — Assessment & Plan Note (Signed)
Secondary to sepsis ?Follow CBC daily. ?

## 2021-07-07 NOTE — Assessment & Plan Note (Signed)
Hemoglobin stable and at baseline ?

## 2021-07-07 NOTE — Assessment & Plan Note (Addendum)
S/p 4 rounds of IV KCl 10 mEq each. ?Magnesium 1.7, will replace IV. ?Follow BMP and magnesium in a.m. ?

## 2021-07-07 NOTE — ED Notes (Signed)
PA Bowie informed only 1 set of blood cultures were able to be obtained d/t poor vasculature. EDP confirmed RN able to start IV ABX ?

## 2021-07-07 NOTE — Progress Notes (Signed)
PCCM Progress Note  ? ?To expedite MRI imaging Neurosurgery requested anesthesia to intubate patient in MRI with transfer to ICU post MRI. PCCM was made aware of plan and is happy to assume critical care needs post intubation. ? ?Chayah Mckee D. Harris, NP-C ?[facility] ?Personal contact information can be found on Amion  ?07/15/2021, 6:21 PM ? ?  ?

## 2021-07-07 NOTE — Assessment & Plan Note (Addendum)
Not sure if this is related to her epidural abscess. ?Foley catheter placed in ED. ?Continue Foley catheter for now. ?

## 2021-07-07 NOTE — ED Notes (Signed)
Discussed pt with admitting provider. Informed pt was original able to hold a conversation with this RN. Since arrival has been given IV ativan and 10 IV versed for imagining. Uncertain if change in mentation has been s/t medication or d/t worsening infection. No new order, to continue to monitor mentation status.  ?

## 2021-07-07 NOTE — Progress Notes (Addendum)
?PROGRESS NOTE ?  ?Linda Cervantes  AYT:[id number]    DOB: 09/19/1965    DOA: 07/02/2021 ? ?PCP: Vevelyn Francois, NP  ? ?I have briefly reviewed patients previous medical records in [facility]. ? ?Chief Complaint  ?Patient presents with  ? Back Pain  ? ?Addendum (2:05 PM): ? ?Patient is now at [facility].  Reviewed chart again.  Neurosurgery has seen, concerned that she may have a significant thoracic epidural abscess/spinal cord compression, planning on a thoracic MRI ASAP and will likely need surgery.  Also noted some intermittently elevated blood pressures.  Signed off to Dr. Cordelia Poche who will reassess patient and make further recommendations.  ID has already been consulted. ? ?Hospital Course:  ?56 year old female with medical history significant for polysubstance abuse, including IVDA, asthma, GERD, anxiety and depression, paranoid schizophrenia, presented to the [facility] ED on 07/03/2021 with complaints of 1 to 2 days of new onset midline low back pain with associated difficulty urinating, urinary urgency, possible weakness of lower extremities.  Admitted for sepsis due to epidural abscess.  Neurosurgery was consulted and recommended transferring to [facility] for further evaluation and management by them.  Awaiting bed for transfer.  I have reached out to the patient placement RNs a couple times since early this morning to expedite transfer. ? ?Assessment and Plan: ?* Epidural abscess ?Sepsis due to epidural abscess, POA ?Met sepsis criteria on admission including fever up to 100.8, tachypnea in the 20s and 30s, tachycardia in the low 100s, 18.6 K leukocytosis and confirmed source. ?MRI L-spine: Dorsal epidural abscess extending from T12-L1 and measuring 6 mm in greatest AP thickness at the L1 level.  Moderate mass effect on the conus medullaris and cauda equina.  Severe spinal canal stenosis at L3-L4 and L4-L5. ?Epidural abscess could be related to seeding from IVDA and  bacteremia. ?Lactate normal.  Urine pregnancy test negative. ?Follow-up blood cultures. ?Requested TTE.  May need TEE. ?Treated with IV fluid bolus and continue empirically started IV vancomycin and cefepime. ?Awaiting transfer from [facility] to [facility] for evaluation by the neurosurgical team to determine need for surgery versus IR drainage.  When drained, samples to be sent off for culture sensitivity. ?Remains n.p.o. for procedure. ?We will consult ID. ? ?Sepsis ([place]) ?(see epidural abscess) ? ?Acute hyponatremia ?Serum sodium in the 130-129 range.  Normal earlier this year. ?May be related to dehydration/hypovolemia. ?IV normal saline hydration and follow-up BMP in AM. ?If does not improve with hydration or if worsens, then need to consider SIADH. ? ?Hypokalemia ?S/p 4 rounds of IV KCl 10 mEq each. ?Magnesium 1.7, will replace IV. ?Follow BMP and magnesium in a.m. ? ?Acute encephalopathy ?Likely multifactorial due to substance abuse and polypharmacy that she received in the ED (Dilaudid 1 Mg x1, Ativan 1 Mg x1, Versed 10 Mg x1) last night. ?CT head without acute findings. ?Quite somnolent and not arousable in the ED this morning but protecting airway and hemodynamically stable. ?Continue to monitor. ? ?Polysubstance abuse ([employer]) ?Blood alcohol negative ?UDS positive for cocaine. ?History of IVDA. ?Substance counseling to be done when able. ? ?Microcytic anemia ?Hemoglobin stable and at baseline ? ?Acute urinary retention ?Not sure if this is related to her epidural abscess. ?Foley catheter placed in ED. ?Continue Foley catheter for now. ? ?Leukocytosis ?Secondary to sepsis ?Follow CBC daily. ? ? ?Body mass index is 21.26 kg/m?. ? ?DVT prophylaxis: SCDs Start: 07/04/2021 0147   ?  Code Status: Full  Code:  ?Family Communication: None at bedside ?Disposition:  ?Status is: Inpatient ?Remains inpatient appropriate because: Epidural abscess, IV antibiotics, need for possible intervention i.e.  surgery versus percutaneous drainage. ?  ? ?Consultants:   ?Neurosurgery ?Infectious disease ? ?Procedures:   ?None ? ?Antimicrobials:   ?IV cefepime and vancomycin 4/6 > ? ? ?Subjective:  ?Seen this morning while still in ED.  Patient somnolent and not arousable even to noxious stimuli.  Protecting airway and hemodynamically stable.  Discussed with ED RN, was agitated earlier but since medications, has been sleeping without any acute issues. ? ?Objective:  ? ?Vitals:  ? 07/29/2021 1100 07/23/2021 1130 07/20/2021 1200 07/24/2021 1230  ?BP: (!) 150/100 (!) 144/80 (!) 105/42 (!) 137/109  ?Pulse: 95 92 94 86  ?Resp: (!) 30 (!) 26 (!) 27 (!) 28  ?Temp:  99 ?F (37.2 ?C)  99 ?F (37.2 ?C)  ?TempSrc:  Oral  Oral  ?SpO2: 96% 94% 100% 100%  ?Weight:      ?Height:      ? ? ?General exam: Young female, moderately built, frail, looks older than stated age, lying comfortably propped up in bed without distress. ?Respiratory system: Clear to auscultation. Respiratory effort normal.  Saturating in the 90s on room air. ?Cardiovascular system: S1 & S2 heard, RRR. No JVD, murmurs, rubs, gallops or clicks. No pedal edema.  Telemetry personally reviewed at bedside: Sinus rhythm. ?Gastrointestinal system: Abdomen is nondistended, soft and nontender. No organomegaly or masses felt. Normal bowel sounds heard. ?GU: Has Foley catheter. ?Central nervous system: Mental status as noted above. No focal neurological deficits.  Spontaneously moves her upper extremities. ?Extremities: As noted above. ?Skin: Healed scars from small cuts over the right knee. ?Psychiatry: Judgement and insight cannot assess at this time. Mood & affect cannot assess at this time ? ?Data Reviewed:   ?I have personally reviewed following labs and imaging studies ? ?CBC: ?Recent Labs  ?Lab 07/19/2021 ?1928 07/18/2021 ?0500  ?WBC 20.6* 18.6*  ?NEUTROABS 17.2* 15.3*  ?HGB 13.7 11.5*  ?HCT 40.4 34.9*  ?MCV 78.3* 78.6*  ?PLT 235 214  ? ?Basic Metabolic Panel: ?Recent Labs  ?Lab  07/29/2021 ?1928 07/29/2021 ?0500  ?NA 130* 129*  ?K 3.2* 3.4*  ?CL 93* 94*  ?CO2 22 24  ?GLUCOSE 119* 118*  ?BUN 21* 15  ?CREATININE 0.67 0.62  ?CALCIUM 9.8 9.0  ?MG  --  1.7  ? ?Liver Function Tests: ?Recent Labs  ?Lab 07/11/2021 ?1928 07/09/2021 ?0500  ?AST 53* 29  ?ALT 53* 37  ?ALKPHOS 85 69  ?BILITOT 1.1 0.7  ?PROT 9.0* 7.4  ?ALBUMIN 4.1 3.2*  ? ?CBG: ?No results for input(s): GLUCAP in the last 168 hours. ?Microbiology Studies:  ? ?Recent Results (from the past 240 hour(s))  ?Resp Panel by RT-PCR (Flu A&B, Covid) Nasopharyngeal Swab     Status: None  ? Collection Time: 07/01/2021 10:02 PM  ? Specimen: Nasopharyngeal Swab; Nasopharyngeal(NP) swabs in vial transport medium  ?Result Value Ref Range Status  ? SARS Coronavirus 2 by RT PCR NEGATIVE NEGATIVE Final  ?  Comment: (NOTE) ?SARS-CoV-2 target nucleic acids are NOT DETECTED. ? ?The SARS-CoV-2 RNA is generally detectable in upper respiratory ?specimens during the acute phase of infection. The lowest ?concentration of SARS-CoV-2 viral copies this assay can detect is ?138 copies/mL. A negative result does not preclude SARS-Cov-2 ?infection and should not be used as the sole basis for treatment or ?other patient management decisions. A negative result may occur with  ?improper specimen collection/handling, submission  of specimen other ?than nasopharyngeal swab, presence of viral mutation(s) within the ?areas targeted by this assay, and inadequate number of viral ?copies(<138 copies/mL). A negative result must be combined with ?clinical observations, patient history, and epidemiological ?information. The expected result is Negative. ? ?Fact Sheet for Patients:  ?EntrepreneurPulse.com.au ? ?Fact Sheet for Healthcare Providers:  ?IncredibleEmployment.be ? ?This test is no t yet approved or cleared by the [place] FDA and  ?has been authorized for detection and/or diagnosis of SARS-CoV-2 by ?FDA under an Emergency Use Authorization  (EUA). This EUA will remain  ?in effect (meaning this test can be used) for the duration of the ?COVID-19 declaration under Section 564(b)(1) of the Act, 21 ?U.S.C.section 360bbb-3(b)(1), unless the authorization is terminated  ?or revoke

## 2021-07-07 NOTE — Anesthesia Procedure Notes (Signed)
Procedure Name: Intubation ?Date/Time: 07/12/2021 6:54 PM ?Performed by: Rande Brunt, CRNA ?Pre-anesthesia Checklist: Patient identified, Emergency Drugs available, Suction available and Patient being monitored ?Patient Re-evaluated:Patient Re-evaluated prior to induction ?Oxygen Delivery Method: Circle System Utilized ?Preoxygenation: Pre-oxygenation with 100% oxygen ?Induction Type: IV induction ?Ventilation: Mask ventilation without difficulty ?Laryngoscope Size: Sabra Heck and 2 ?Grade View: Grade I ?Tube type: Oral ?Tube size: 7.5 mm ?Number of attempts: 1 ?Airway Equipment and Method: Stylet and Oral airway ?Placement Confirmation: ETT inserted through vocal cords under direct vision, positive ETCO2 and breath sounds checked- equal and bilateral ?Secured at: 22 cm ?Tube secured with: Tape ?Dental Injury: Teeth and Oropharynx as per pre-operative assessment  ? ? ? ? ?

## 2021-07-07 NOTE — Progress Notes (Signed)
I spoke with the patient's  56 yo son, Haynes Dage, on updated him on his mother's condition.  I informed in that she very sick and is being intubated and that we are getting in the emergency thoracic MRI.   ? ?I have told him that I think she will need a thoracic laminectomy to drain with his likely a thoracic epidural abscess.  I described the surgery to him.  We discussed the risk surgery including risks of anesthesia, hemorrhage, infection, medical risk, failure to improve her paralysis, etcetera.  I have answered all his questions.  He has given consent on behalf his mother.  I will update him a once we get the thoracic MRI. ?

## 2021-07-07 NOTE — Progress Notes (Signed)
I was just informed the patient cannot lie flat for her MRI because she is agitated and hypoxic. ? ?The patient is not moving her legs.  I think she has a thoracic lesion causing this.  We will need to get a thoracic MRI so I know if she needs surgery and which level to operate on.   ? ?I have posted the patient for a stat thoracic in brain MRI under general anesthesia. ?

## 2021-07-07 NOTE — Transfer of Care (Addendum)
Immediate Anesthesia Transfer of Care Note ? ?Patient: Linda Cervantes ? ?Procedure(s) Performed: MRI WITH ANESTHESIA ? ?Patient Location: To OR from MRI ? ?Anesthesia Type:General ? ?Level of Consciousness: Patient remains intubated per anesthesia plan ? ?Airway & Oxygen Therapy: Patient remains intubated per anesthesia plan and Patient placed on Ventilator (see vital sign flow sheet for setting) ? ?Post-op Assessment: Report given to RN and Post -op Vital signs reviewed and stable ? ?Post vital signs: Reviewed and stable ? ?Last Vitals:  ?Vitals Value Taken Time  ?BP    ?Temp    ?Pulse    ?Resp    ?SpO2    ? ? ?Last Pain:  ?Vitals:  ? 07/30/2021 1701  ?TempSrc: Oral  ?PainSc:   ?   ? ?  ? ?Complications: No notable events documented. ?

## 2021-07-07 NOTE — Assessment & Plan Note (Addendum)
Likely multifactorial due to substance abuse and polypharmacy that she received in the ED (Dilaudid 1 Mg x1, Ativan 1 Mg x1, Versed 10 Mg x1) last night. ?CT head without acute findings. ?Quite somnolent and not arousable in the ED this morning but protecting airway and hemodynamically stable. ?Continue to monitor. ?

## 2021-07-07 NOTE — Consult Note (Addendum)
? ?NAME:  SHAHD OCCHIPINTI, MRN:  [id number], DOB:  10/02/1965, LOS: 0 ?ADMISSION DATE:  07/29/2021, CONSULTATION DATE:  07/12/2021 ?REFERRING MD:  Dr. Lonny Prude, CHIEF COMPLAINT:  Respiratory distress   ? ?History of Present Illness:  ?CHALISA KOBLER is a 56 y.o. female with a PMH significant for IV drug use, substance abuse, paranoid schizophrenia, depression, asthma, anxiety, and back pain who presented to [facility] ED for complaints of new onset mid to low back pain with associated difficulty urinating and possible lower extremity weakness. On admit she met criteria for sepsis and was admitted for further workup per TRH ? 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 Neurosurgery was called and request urgent transfer to [facility] for further imaging and possible need for surgical intervention.  ? ?On arrival to [facility] attempts were made to obtain MRI of thoracic spine but patient did no tolerate flat position and quick displayed signs of respiratory distress. MRI was canceled and PCCM was consulted for possible need of airway support.  ? ?Pertinent  Medical History  ?IV drug use, substance abuse, paranoid schizophrenia, depression, asthma, anxiety, and back pain ? ?Significant Hospital Events: ?Including procedures, antibiotic start and stop dates in addition to other pertinent events   ?4/6 admitted for acute back pain, inability to urinate and possible lower extremity weakness. Found to have large epidural abscess ? ?Interim History / Subjective:  ?As above  ? ?Objective   ?Blood pressure (!) 194/117, pulse 86, temperature 98.8 ?F (37.1 ?C), temperature source Oral, resp. rate (!) 24, height $RemoveBe'5\' 3"'RAybuqEQC$  (1.6 m), weight 54.4 kg, SpO2 100 %. ?   ?   ? ?Intake/Output Summary (Last 24 hours) at 07/26/2021 1729 ?Last data filed at 07/26/2021 1052 ?Gross per 24 hour  ?Intake 501.67 ml  ?Output 1000 ml  ?Net -498.33 ml  ? ?Filed Weights  ?  07/12/2021 1913 07/01/2021 1924  ?Weight: 54.4 kg 54.4 kg  ? ? ?Examination: ?General: Acute on chronically ill appearing thin deconditioned middle aged female lying in bed in NAD ?HEENT: [place]/AT, MM pink/moist, PERRL,  ?Neuro: Minimally responsive to pain, unable to follow commands  ?CV: s1s2 regular rate and rhythm, no murmur, rubs, or gallops,  ?PULM:  Rhonchi bilaterally, concern for ability to protect airway  ?GI: soft, bowel sounds active in all 4 quadrants, non-tender, non-distended ?Extremities: warm/dry, no edema  ?Skin: no rashes or lesions ? ?Resolved Hospital Problem list   ? ? 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 ?Acute Respiratory distress   ?-On arrival to [facility] attempts were made to obtain MRI of thoracic spine but patient did no tolerate flat position and quick displayed signs of respiratory distress, seen with diffuse rhonchi bilaterally  ?P: ?Transfer to ICU for close monitoring of airway  ?She is currently protecting her airway but high concern for decompensation  ?Once intubated Ventilator support with lung protective strategies  ?Wean PEEP and FiO2 for sats  greater than 90%. ?Head of bed elevated 30 degrees. ?Plateau pressures less than 30 cm H20.  ?Follow intermittent chest x-ray and ABG.   ?SAT/SBT as tolerated, mentation preclude extubation  ?Ensure adequate pulmonary hygiene  ?Follow cultures  ?VAP bundle in place  ?PAD protocol ?Antibiotics as  above  ? ?Metabolic derangements; ?Hyponatremia  ?Hypokalemia  ?P: ?IV hydration  ?Supplement as needed  ?Trend Bmet  ? ?IV drug abuse, polysubstance abuse  ?-UDS positive for cocaine ?P: ?Cessation education when able  ? ?Acute urinary retention  ?P: ?Continue foley  ?Address neurosurgery needs prior to voiding trial  ? ?Best Practice (right click and "Reselect all SmartList Selections" daily)  ? ?Diet/type: NPO ?DVT prophylaxis: SCD ?GI prophylaxis: PPI ?Lines: [name] ?Foley:  Yes, and it is still needed ?Code Status:  full code ?Last date of multidisciplinary goals of care discussion: Pending discussion with family. Patient too encephalopathic to discuss [place] ? ?Labs   ?CBC: ?Recent Labs  ?Lab 07/21/2021 ?1928 07/13/2021 ?0500  ?WBC 20.6* 18.6*  ?NEUTROABS 17.2* 15.3*  ?HGB 13.7 11.5*  ?HCT 40.4 34.9*  ?MCV 78.3* 78.6*  ?PLT 235 214  ? ? ?Basic Metabolic Panel: ?Recent Labs  ?Lab 07/05/2021 ?1928 07/23/2021 ?0500  ?NA 130* 129*  ?K 3.2* 3.4*  ?CL 93* 94*  ?CO2 22 24  ?GLUCOSE 119* 118*  ?BUN 21* 15  ?CREATININE 0.67 0.62  ?CALCIUM 9.8 9.0  ?MG  --  1.7  ? ?GFR: ?Estimated Creatinine Clearance: 67.3 mL/min (by C-G formula based on SCr of 0.62 mg/dL). ?Recent Labs  ?Lab 07/18/2021 ?1928 07/14/2021 ?1945 07/03/2021 ?0016 07/02/2021 ?0500  ?WBC 20.6*  --   --  18.6*  ?LATICACIDVEN  --  1.9 1.1  --   ? ? ?Liver Function Tests: ?Recent Labs  ?Lab 07/26/2021 ?1928 07/14/2021 ?0500  ?AST 53* 29  ?ALT 53* 37  ?ALKPHOS 85 69  ?BILITOT 1.1 0.7  ?PROT 9.0* 7.4  ?ALBUMIN 4.1 3.2*  ? ?Recent Labs  ?Lab 07/29/2021 ?1928  ?LIPASE 27  ? ?No results for input(s): AMMONIA in the last 168 hours. ? ?ABG ?   ?Component Value Date/Time  ? TCO2 20 07/07/2011 0213  ?  ? ?Coagulation Profile: ?Recent Labs  ?Lab 07/13/2021 ?0500  ?INR 1.0  ? ? ?Cardiac Enzymes: ?No results for input(s): CKTOTAL, CKMB, CKMBINDEX, TROPONINI in the last 168 hours. ? ?HbA1C: ?Hemoglobin A1C  ?Date/Time Value Ref Range Status  ?02/04/2018 11:08 AM 5.3 4.0 - 5.6 % Final  ?03/20/2017 02:23  PM 5.4  Final  ? ? ?CBG: ?No results for input(s): GLUCAP in the last 168 hours. ? ?Review of Systems:   ?Unable to assess  ? 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  ? ?Surgical History:  ? ?Past Surgical History:  ?Procedure Laterality Date  ? CESAREAN SECTION    ? FOOT SURGERY    ? INCISION AND DRAINAGE OF WOUND Right 05/22/2018  ? Procedure: IRRIGATION AND DEBRIDEMENT RIGHT ARM WITH REMOVAL FOREIGN BODY;  Surgeon: [name], MD;  Location: [facility];  Service: Orthopedics;  Laterality: Right;  ? TUBAL LIGATION  2000  ?  ? ?Social History:  ? reports that she has been smoking cigarettes. She has a 40.00 pack-year smoking history. She has been exposed to tobacco smoke. She has never used smokeless  tobacco. She reports that she does not currently use drugs after having used the following drugs: Cocaine, Heroin, and IV. She reports that she does not drink alcohol.  ? ?Family History:  ?Her family history includes Asthma in her child; Diabetes in her maternal grandmother; Heart Problems in her sister; Hypertension in her father; Rheumatic fever in her paternal uncle; Valvular heart disease in her father. There is no history of Colon cancer, Esophageal cancer, Pancreatic cancer, or Stomach cancer.  ? ?Allergies ?Allergies  ?Allergen Reactions  ? Amitriptyline Hcl Swelling and Other (See Comments)  ?  Face Swelling  ? Tylenol [Acetaminophen] Other (See Comments)  ?  Reaction not recalled by [name]  ?  ? ?Home Medications  ?Prior to Admission medications   ?Medication Sig Start Date End Date Taking? Authorizing Provider  ?methadone (DOLOPHINE) 10 MG/5ML solution Take by mouth every 6 (six) hours as needed for pain.   Yes [provider]   ?methadone (DOLOPHINE) 10 MG/ML solution Take by mouth.   Yes [provider]  ?methocarbamol (ROBAXIN) 500 MG tablet Take 1 tablet (500 mg total) by mouth at bedtime as needed for muscle spasms. 12/19/2

## 2021-07-07 NOTE — Assessment & Plan Note (Addendum)
Blood alcohol negative ?UDS positive for cocaine. ?History of IVDA. ?Substance counseling to be done when able. ?

## 2021-07-07 NOTE — ED Notes (Signed)
Pt s/o Jaye Beagle can be reached at [phone]. [name] taking pt's cell phone.  ?

## 2021-07-07 NOTE — Assessment & Plan Note (Signed)
(  see epidural abscess) ?

## 2021-07-08 ENCOUNTER — Inpatient Hospital Stay (HOSPITAL_COMMUNITY): Payer: 59

## 2021-07-08 ENCOUNTER — Encounter (HOSPITAL_COMMUNITY): Payer: Self-pay | Admitting: Radiology

## 2021-07-08 ENCOUNTER — Encounter (HOSPITAL_COMMUNITY): Admission: EM | Disposition: E | Payer: Self-pay | Source: Home / Self Care | Attending: Internal Medicine

## 2021-07-08 DIAGNOSIS — R579 Shock, unspecified: Secondary | ICD-10-CM | POA: Diagnosis not present

## 2021-07-08 DIAGNOSIS — G9340 Encephalopathy, unspecified: Secondary | ICD-10-CM | POA: Diagnosis not present

## 2021-07-08 DIAGNOSIS — R7881 Bacteremia: Secondary | ICD-10-CM | POA: Diagnosis not present

## 2021-07-08 DIAGNOSIS — G062 Extradural and subdural abscess, unspecified: Secondary | ICD-10-CM | POA: Diagnosis not present

## 2021-07-08 DIAGNOSIS — J9601 Acute respiratory failure with hypoxia: Secondary | ICD-10-CM | POA: Diagnosis not present

## 2021-07-08 HISTORY — PX: LAMINECTOMY: SHX219

## 2021-07-08 LAB — CBC
HCT: 33.4 % — ABNORMAL LOW (ref 36.0–46.0)
Hemoglobin: 11 g/dL — ABNORMAL LOW (ref 12.0–15.0)
MCH: 26.2 pg (ref 26.0–34.0)
MCHC: 32.9 g/dL (ref 30.0–36.0)
MCV: 79.5 fL — ABNORMAL LOW (ref 80.0–100.0)
Platelets: 218 10*3/uL (ref 150–400)
RBC: 4.2 MIL/uL (ref 3.87–5.11)
RDW: 14.2 % (ref 11.5–15.5)
WBC: 17.9 10*3/uL — ABNORMAL HIGH (ref 4.0–10.5)
nRBC: 0 % (ref 0.0–0.2)

## 2021-07-08 LAB — MAGNESIUM
Magnesium: 1.7 mg/dL (ref 1.7–2.4)
Magnesium: 2.6 mg/dL — ABNORMAL HIGH (ref 1.7–2.4)

## 2021-07-08 LAB — MRSA NEXT GEN BY PCR, NASAL: MRSA by PCR Next Gen: DETECTED — AB

## 2021-07-08 LAB — BLOOD CULTURE ID PANEL (REFLEXED) - BCID2

## 2021-07-08 LAB — RPR: RPR Ser Ql: NONREACTIVE

## 2021-07-08 LAB — POCT I-STAT 7, (LYTES, BLD GAS, ICA,H+H)
Acid-base deficit: 2 mmol/L (ref 0.0–2.0)
Acid-base deficit: 2 mmol/L (ref 0.0–2.0)
Bicarbonate: 23.3 mmol/L (ref 20.0–28.0)
Bicarbonate: 23.9 mmol/L (ref 20.0–28.0)
Calcium, Ion: 1.1 mmol/L — ABNORMAL LOW (ref 1.15–1.40)
Calcium, Ion: 1.1 mmol/L — ABNORMAL LOW (ref 1.15–1.40)
HCT: 31 % — ABNORMAL LOW (ref 36.0–46.0)
HCT: 33 % — ABNORMAL LOW (ref 36.0–46.0)
Hemoglobin: 10.5 g/dL — ABNORMAL LOW (ref 12.0–15.0)
Hemoglobin: 11.2 g/dL — ABNORMAL LOW (ref 12.0–15.0)
O2 Saturation: 87 %
O2 Saturation: 93 %
Patient temperature: 98.6
Patient temperature: 98.6
Potassium: 3 mmol/L — ABNORMAL LOW (ref 3.5–5.1)
Potassium: 3.8 mmol/L (ref 3.5–5.1)
Sodium: 133 mmol/L — ABNORMAL LOW (ref 135–145)
Sodium: 134 mmol/L — ABNORMAL LOW (ref 135–145)
TCO2: 24 mmol/L (ref 22–32)
TCO2: 25 mmol/L (ref 22–32)
pCO2 arterial: 41.1 mmHg (ref 32–48)
pCO2 arterial: 44.9 mmHg (ref 32–48)
pH, Arterial: 7.334 — ABNORMAL LOW (ref 7.35–7.45)
pH, Arterial: 7.361 (ref 7.35–7.45)
pO2, Arterial: 55 mmHg — ABNORMAL LOW (ref 83–108)
pO2, Arterial: 73 mmHg — ABNORMAL LOW (ref 83–108)

## 2021-07-08 LAB — ECHOCARDIOGRAM COMPLETE
Area-P 1/2: 5.5 cm2
Calc EF: 73.4 %
Height: 63 in
S' Lateral: 2.05 cm
Single Plane A2C EF: 73.7 %
Single Plane A4C EF: 74.1 %
Weight: 1816.59 oz

## 2021-07-08 LAB — HEMOGLOBIN A1C
Hgb A1c MFr Bld: 5.5 % (ref 4.8–5.6)
Mean Plasma Glucose: 111.15 mg/dL

## 2021-07-08 LAB — COMPREHENSIVE METABOLIC PANEL
ALT: 27 U/L (ref 0–44)
AST: 22 U/L (ref 15–41)
Albumin: 2.1 g/dL — ABNORMAL LOW (ref 3.5–5.0)
Alkaline Phosphatase: 57 U/L (ref 38–126)
Anion gap: 7 (ref 5–15)
BUN: 11 mg/dL (ref 6–20)
CO2: 25 mmol/L (ref 22–32)
Calcium: 8.4 mg/dL — ABNORMAL LOW (ref 8.9–10.3)
Chloride: 102 mmol/L (ref 98–111)
Creatinine, Ser: 0.55 mg/dL (ref 0.44–1.00)
GFR, Estimated: >60 mL/min (ref >60)
Glucose, Bld: 110 mg/dL — ABNORMAL HIGH (ref 70–99)
Potassium: 3.5 mmol/L (ref 3.5–5.1)
Sodium: 134 mmol/L — ABNORMAL LOW (ref 135–145)
Total Bilirubin: 0.9 mg/dL (ref 0.3–1.2)
Total Protein: 6.4 g/dL — ABNORMAL LOW (ref 6.5–8.1)

## 2021-07-08 LAB — GLUCOSE, CAPILLARY
Glucose-Capillary: 134 mg/dL — ABNORMAL HIGH (ref 70–99)
Glucose-Capillary: 148 mg/dL — ABNORMAL HIGH (ref 70–99)
Glucose-Capillary: 157 mg/dL — ABNORMAL HIGH (ref 70–99)

## 2021-07-08 LAB — TRIGLYCERIDES: Triglycerides: 73 mg/dL (ref <150)

## 2021-07-08 LAB — PHOSPHORUS: Phosphorus: 3.6 mg/dL (ref 2.5–4.6)

## 2021-07-08 LAB — HIV ANTIBODY (ROUTINE TESTING W REFLEX): HIV Screen 4th Generation wRfx: NONREACTIVE

## 2021-07-08 IMAGING — DX DG ABDOMEN 1V
1 series · 1 of 1 positions shown · non-contrast
Comparison: Earlier same day

CLINICAL DATA: NG tube placement

EXAM:
ABDOMEN - 1 VIEW

[abdomen kub]
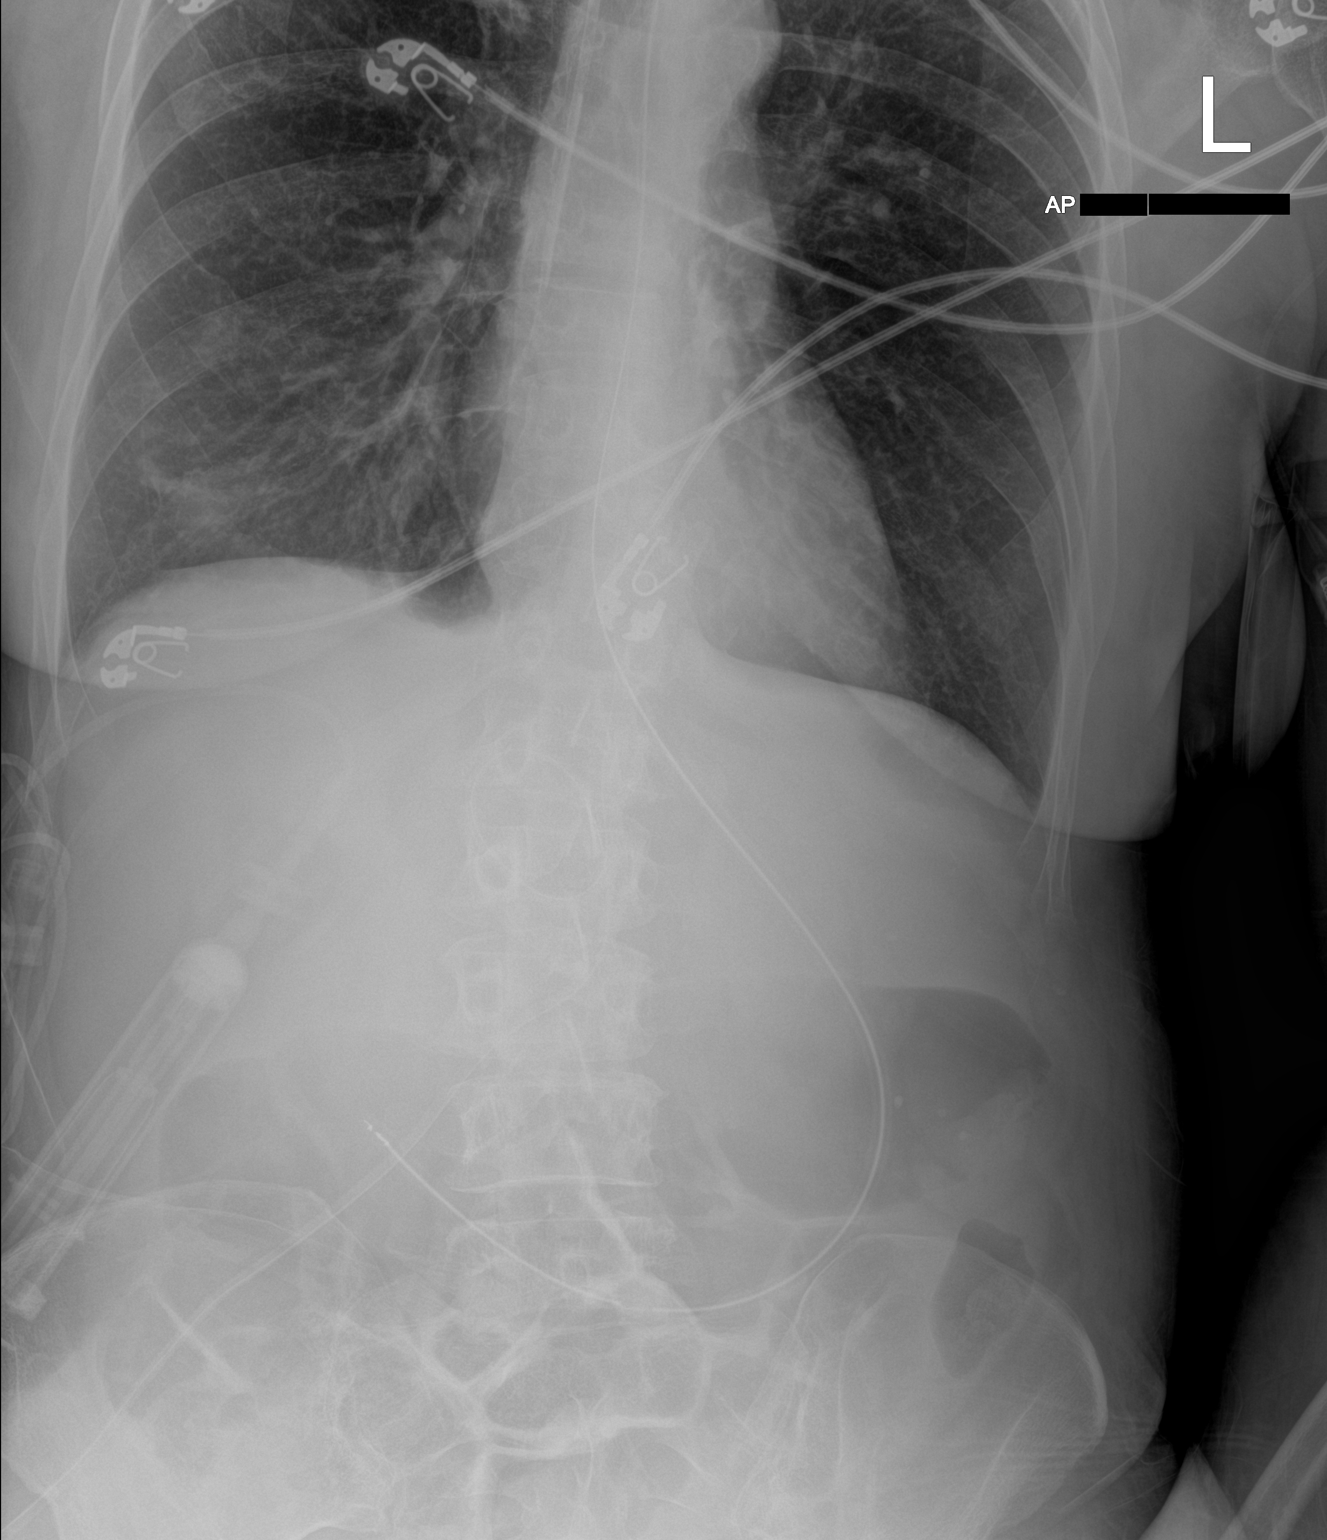

[1 of 1 positions shown; findings below may reference images not displayed]

FINDINGS: NG tube is positioned with the tip in the distal stomach at the
pylorus. Gaseous bowel distension noted over the visualized upper
abdomen.
IMPRESSION: NG tube tip is in the distal stomach near the pylorus.

## 2021-07-08 IMAGING — DX DG CHEST 1V PORT
1 series · 1 of 1 positions shown · non-contrast
Comparison: Earlier the same day

CLINICAL DATA: Adjusted endotracheal tube

EXAM:
PORTABLE CHEST 1 VIEW

[chest ap]
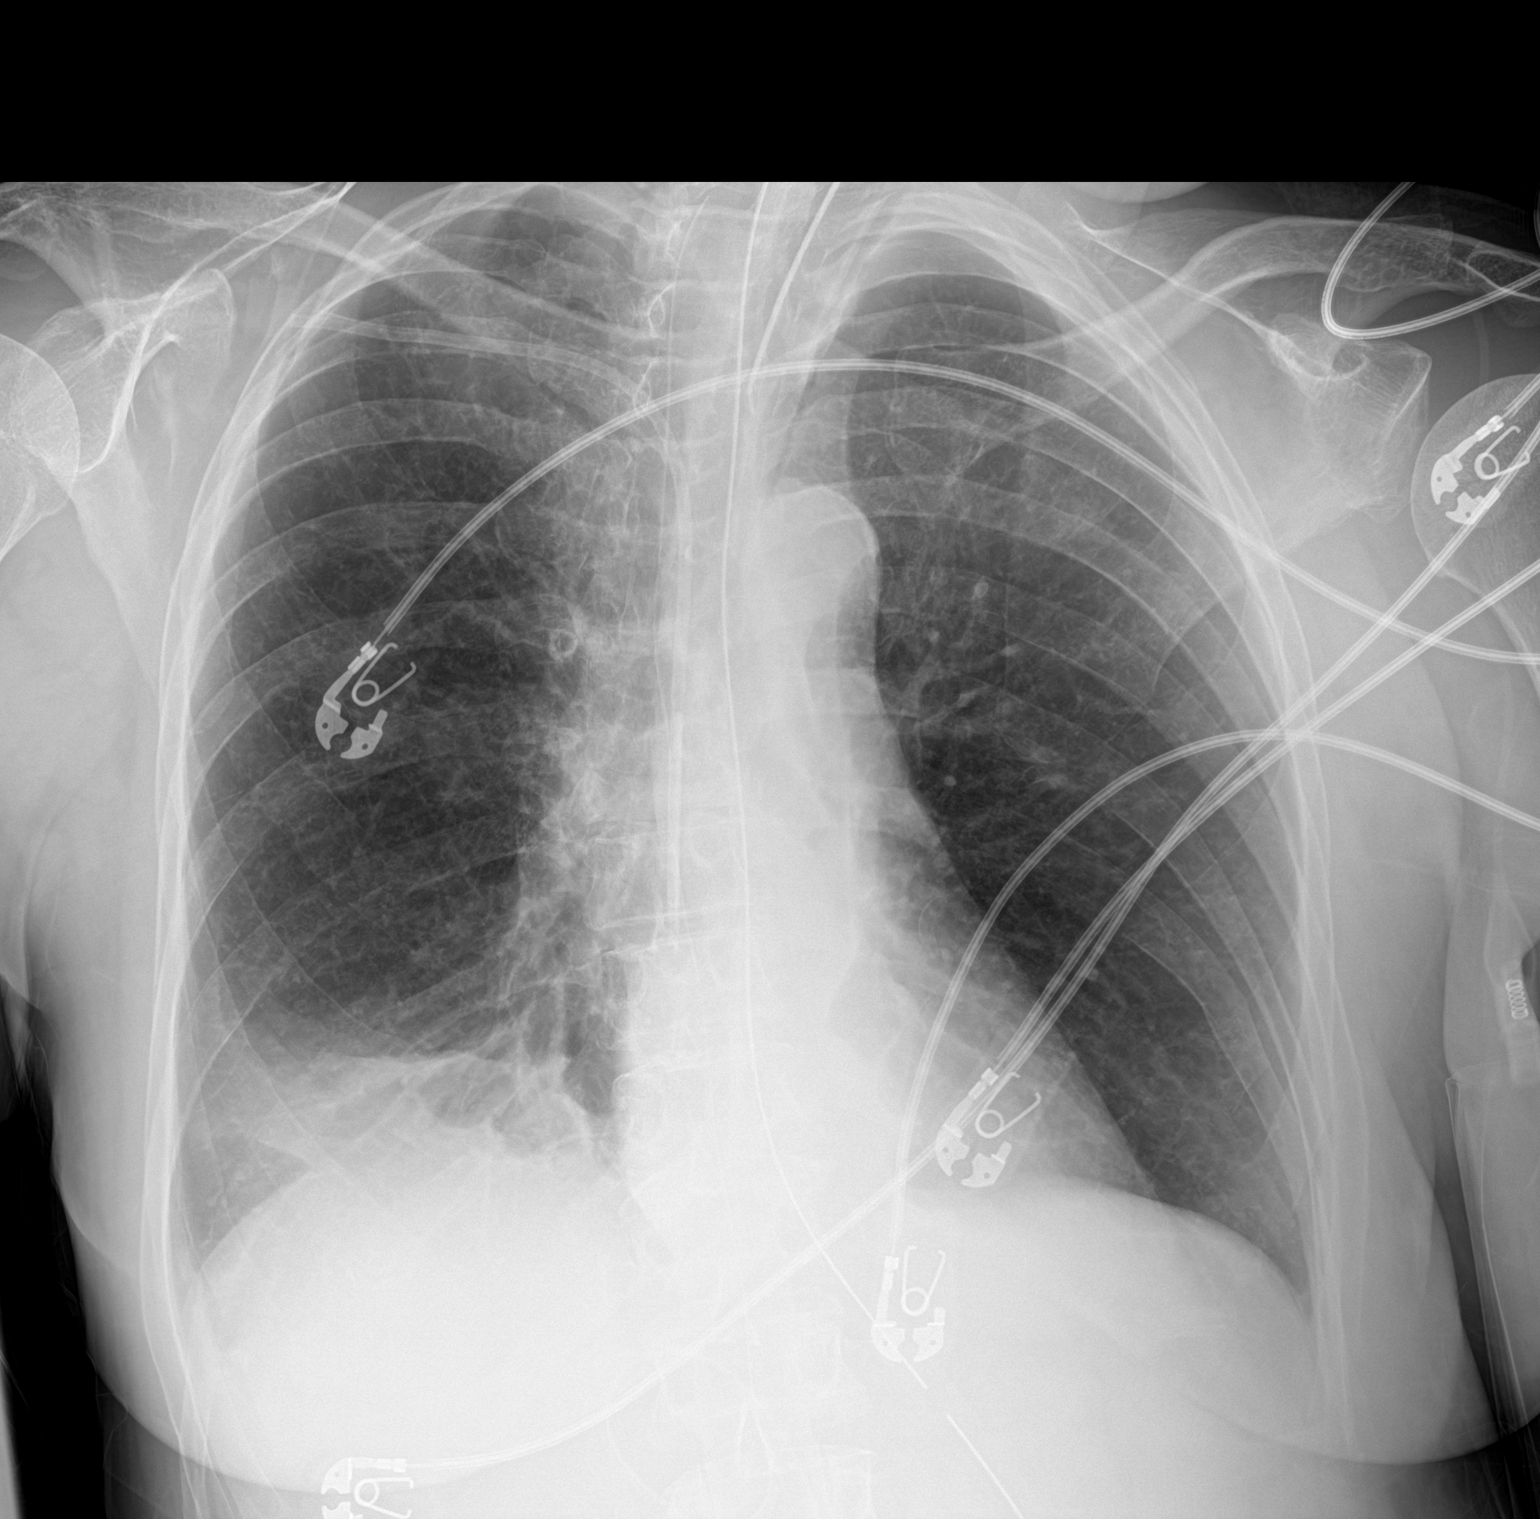

[1 of 1 positions shown; findings below may reference images not displayed]

FINDINGS: Endotracheal tube with tip at the clavicular heads. An enteric tube
tip and side-port reaches the stomach. Right central line with tip
at the SVC. LESSIE catheter over the right neck. Catheter overlaps the
midline chest, possibly intraspinal. Opacity with volume loss at the
right base, mildly improved. Normal heart size and mediastinal
contours.
IMPRESSION: 1. Stable hardware positioning.
2. Mildly improved atelectasis at the right base.

## 2021-07-08 SURGERY — THORACIC LAMINECTOMY FOR TUMOR
Anesthesia: General | Site: Back

## 2021-07-08 SURGERY — THORACIC LAMINECTOMY FOR TUMOR
Anesthesia: General

## 2021-07-08 MED ORDER — PHENYLEPHRINE HCL-NACL 20-0.9 MG/250ML-% IV SOLN
0.0000 ug/min | INTRAVENOUS | Status: DC
  Administered 2021-07-08: 30 ug/min via INTRAVENOUS
  Filled 2021-07-08: qty 250

## 2021-07-08 MED ORDER — FUROSEMIDE 10 MG/ML IJ SOLN
40.0000 mg | Freq: Once | INTRAMUSCULAR | Status: AC
  Administered 2021-07-08: 40 mg via INTRAVENOUS
  Filled 2021-07-08: qty 4

## 2021-07-08 MED ORDER — MUPIROCIN 2 % EX OINT
1.0000 "application " | TOPICAL_OINTMENT | Freq: Two times a day (BID) | CUTANEOUS | Status: AC
  Administered 2021-07-08 – 2021-07-12 (×10): 1 via NASAL
  Filled 2021-07-08 (×2): qty 22

## 2021-07-08 MED ORDER — INSULIN ASPART 100 UNIT/ML IJ SOLN
0.0000 [IU] | INTRAMUSCULAR | Status: DC
  Administered 2021-07-08 – 2021-07-09 (×2): 2 [IU] via SUBCUTANEOUS
  Administered 2021-07-09: 3 [IU] via SUBCUTANEOUS

## 2021-07-08 MED ORDER — VITAL 1.5 CAL PO LIQD
1000.0000 mL | ORAL | Status: DC
  Administered 2021-07-08 – 2021-07-12 (×3): 1000 mL

## 2021-07-08 MED ORDER — PROSOURCE TF PO LIQD
45.0000 mL | Freq: Every day | ORAL | Status: DC
  Administered 2021-07-08 – 2021-07-12 (×5): 45 mL
  Filled 2021-07-08 (×5): qty 45

## 2021-07-08 MED ORDER — ALBUTEROL SULFATE (2.5 MG/3ML) 0.083% IN NEBU
2.5000 mg | INHALATION_SOLUTION | Freq: Four times a day (QID) | RESPIRATORY_TRACT | Status: DC | PRN
  Administered 2021-07-11 – 2021-07-13 (×2): 2.5 mg via RESPIRATORY_TRACT
  Filled 2021-07-08 (×2): qty 3

## 2021-07-08 MED ORDER — VANCOMYCIN HCL 1250 MG/250ML IV SOLN
1250.0000 mg | INTRAVENOUS | Status: DC
  Administered 2021-07-08 – 2021-07-11 (×4): 1250 mg via INTRAVENOUS
  Filled 2021-07-08 (×5): qty 250

## 2021-07-08 MED ORDER — IPRATROPIUM-ALBUTEROL 0.5-2.5 (3) MG/3ML IN SOLN
3.0000 mL | Freq: Once | RESPIRATORY_TRACT | Status: AC
  Administered 2021-07-08: 3 mL via RESPIRATORY_TRACT
  Filled 2021-07-08: qty 3

## 2021-07-08 MED ORDER — ACETAMINOPHEN 325 MG PO TABS
650.0000 mg | ORAL_TABLET | Freq: Four times a day (QID) | ORAL | Status: DC | PRN
  Administered 2021-07-08 – 2021-07-10 (×3): 650 mg
  Filled 2021-07-08 (×4): qty 2

## 2021-07-08 MED ORDER — IPRATROPIUM-ALBUTEROL 0.5-2.5 (3) MG/3ML IN SOLN
3.0000 mL | Freq: Four times a day (QID) | RESPIRATORY_TRACT | Status: AC
  Administered 2021-07-08 – 2021-07-09 (×4): 3 mL via RESPIRATORY_TRACT
  Filled 2021-07-08 (×4): qty 3

## 2021-07-08 MED ORDER — MAGNESIUM SULFATE 2 GM/50ML IV SOLN
2.0000 g | Freq: Once | INTRAVENOUS | Status: AC
  Administered 2021-07-08: 2 g via INTRAVENOUS
  Filled 2021-07-08: qty 50

## 2021-07-08 MED ORDER — CHLORHEXIDINE GLUCONATE 0.12% ORAL RINSE (MEDLINE KIT)
15.0000 mL | Freq: Two times a day (BID) | OROMUCOSAL | Status: DC
  Administered 2021-07-08 – 2021-07-16 (×18): 15 mL via OROMUCOSAL

## 2021-07-08 MED ORDER — VITAL HIGH PROTEIN PO LIQD: 1000.0000 mL | ORAL | Status: DC

## 2021-07-08 MED ORDER — CHLORHEXIDINE GLUCONATE CLOTH 2 % EX PADS
6.0000 | MEDICATED_PAD | Freq: Every day | CUTANEOUS | Status: DC
  Administered 2021-07-09 – 2021-07-13 (×7): 6 via TOPICAL

## 2021-07-08 MED ORDER — ACETAMINOPHEN 650 MG RE SUPP: 650.0000 mg | Freq: Four times a day (QID) | RECTAL | Status: DC | PRN

## 2021-07-08 MED ORDER — ORAL CARE MOUTH RINSE
15.0000 mL | OROMUCOSAL | Status: DC
  Administered 2021-07-08 – 2021-07-17 (×78): 15 mL via OROMUCOSAL

## 2021-07-08 SURGICAL SUPPLY — 71 items
BAG COUNTER SPONGE SURGICOUNT (BAG) ×2 IMPLANT
BAND RUBBER #18 3X1/16 STRL (MISCELLANEOUS) IMPLANT
BENZOIN TINCTURE PRP APPL 2/3 (GAUZE/BANDAGES/DRESSINGS) ×2 IMPLANT
BIT DRILL NEURO 2X3.1 SFT TUCH (MISCELLANEOUS) ×1 IMPLANT
BLADE CLIPPER SURG (BLADE) IMPLANT
BLADE SURG 11 STRL SS (BLADE) IMPLANT
BLADE ULTRA TIP 2M (BLADE) IMPLANT
BUR CARBIDE MATCH 3.0 (BURR) ×1 IMPLANT
BUR MATCHSTICK NEURO 3.0 LAGG (BURR) ×2 IMPLANT
BUR PRECISION FLUTE 6.0 (BURR) ×1 IMPLANT
CANISTER SUCT 3000ML PPV (MISCELLANEOUS) ×2 IMPLANT
CARTRIDGE OIL MAESTRO DRILL (MISCELLANEOUS) ×1 IMPLANT
CLIP VESOCCLUDE MED 6/CT (CLIP) IMPLANT
CLSR STERI-STRIP ANTIMIC 1/2X4 (GAUZE/BANDAGES/DRESSINGS) ×2 IMPLANT
COTTONBALL LRG STERILE PKG (GAUZE/BANDAGES/DRESSINGS) IMPLANT
COVER MAYO STAND STRL (DRAPES) IMPLANT
DIFFUSER DRILL AIR PNEUMATIC (MISCELLANEOUS) ×2 IMPLANT
DRAPE CAMERA VIDEO/LASER (DRAPES) IMPLANT
DRAPE INCISE IOBAN 85X60 (DRAPES) ×1 IMPLANT
DRAPE LAPAROTOMY 100X72 PEDS (DRAPES) IMPLANT
DRAPE LAPAROTOMY 100X72X124 (DRAPES) ×1 IMPLANT
DRAPE MICROSCOPE LEICA (MISCELLANEOUS) IMPLANT
DRAPE SURG 17X23 STRL (DRAPES) ×8 IMPLANT
DRILL NEURO 2X3.1 SOFT TOUCH (MISCELLANEOUS)
DRSG OPSITE POSTOP 4X10 (GAUZE/BANDAGES/DRESSINGS) ×1 IMPLANT
ELECT REM PT RETURN 9FT ADLT (ELECTROSURGICAL) ×2
ELECTRODE REM PT RTRN 9FT ADLT (ELECTROSURGICAL) ×1 IMPLANT
EVACUATOR SILICONE 100CC (DRAIN) IMPLANT
GAUZE 4X4 16PLY ~~LOC~~+RFID DBL (SPONGE) ×2 IMPLANT
GAUZE SPONGE 4X4 12PLY STRL (GAUZE/BANDAGES/DRESSINGS) ×2 IMPLANT
GLOVE BIO SURGEON STRL SZ 6.5 (GLOVE) ×4 IMPLANT
GLOVE BIO SURGEON STRL SZ7 (GLOVE) ×4 IMPLANT
GLOVE EXAM NITRILE XL STR (GLOVE) IMPLANT
GLOVE SURG ENC MOIS LTX SZ8 (GLOVE) ×2 IMPLANT
GLOVE SURG ENC MOIS LTX SZ8.5 (GLOVE) ×2 IMPLANT
GOWN STRL REUS W/ TWL LRG LVL3 (GOWN DISPOSABLE) IMPLANT
GOWN STRL REUS W/ TWL XL LVL3 (GOWN DISPOSABLE) IMPLANT
GOWN STRL REUS W/TWL LRG LVL3 (GOWN DISPOSABLE) ×6
GOWN STRL REUS W/TWL XL LVL3 (GOWN DISPOSABLE) ×2
KIT BASIN OR (CUSTOM PROCEDURE TRAY) ×2 IMPLANT
KIT TURNOVER KIT B (KITS) ×2 IMPLANT
NDL HYPO 21X1.5 SAFETY (NEEDLE) IMPLANT
NDL SPNL 18GX3.5 QUINCKE PK (NEEDLE) IMPLANT
NEEDLE HYPO 21X1.5 SAFETY (NEEDLE) ×2 IMPLANT
NEEDLE HYPO 22GX1.5 SAFETY (NEEDLE) ×2 IMPLANT
NEEDLE SPNL 18GX3.5 QUINCKE PK (NEEDLE) IMPLANT
NS IRRIG 1000ML POUR BTL (IV SOLUTION) ×2 IMPLANT
OIL CARTRIDGE MAESTRO DRILL (MISCELLANEOUS) ×2
PACK LAMINECTOMY NEURO (CUSTOM PROCEDURE TRAY) ×2 IMPLANT
PAD ARMBOARD 7.5X6 YLW CONV (MISCELLANEOUS) ×5 IMPLANT
PATTIES SURGICAL .25X.25 (GAUZE/BANDAGES/DRESSINGS) IMPLANT
PATTIES SURGICAL .5 X.5 (GAUZE/BANDAGES/DRESSINGS) IMPLANT
PATTIES SURGICAL .5 X3 (DISPOSABLE) IMPLANT
PATTIES SURGICAL 1/4 X 3 (GAUZE/BANDAGES/DRESSINGS) IMPLANT
PATTIES SURGICAL 1X1 (DISPOSABLE) IMPLANT
SPONGE NEURO XRAY DETECT 1X3 (DISPOSABLE) IMPLANT
SPONGE T-LAP 4X18 ~~LOC~~+RFID (SPONGE) ×1 IMPLANT
STAPLER SKIN PROX WIDE 3.9 (STAPLE) ×1 IMPLANT
STRIP CLOSURE SKIN 1/2X4 (GAUZE/BANDAGES/DRESSINGS) ×2 IMPLANT
SUT ETHILON 3 0 FSL (SUTURE) ×2 IMPLANT
SUT NURALON 4 0 TR CR/8 (SUTURE) IMPLANT
SUT PROLENE 6 0 BV (SUTURE) IMPLANT
SUT SILK 3 0 TIES 17X18 (SUTURE)
SUT SILK 3-0 18XBRD TIE BLK (SUTURE) IMPLANT
SUT VIC AB 1 CT1 18XBRD ANBCTR (SUTURE) ×1 IMPLANT
SUT VIC AB 1 CT1 8-18 (SUTURE) ×6
SUT VIC AB 2-0 CP2 18 (SUTURE) ×4 IMPLANT
TOWEL GREEN STERILE (TOWEL DISPOSABLE) ×2 IMPLANT
TOWEL GREEN STERILE FF (TOWEL DISPOSABLE) ×3 IMPLANT
TRAY FOLEY MTR SLVR 16FR STAT (SET/KITS/TRAYS/PACK) IMPLANT
WATER STERILE IRR 1000ML POUR (IV SOLUTION) ×2 IMPLANT

## 2021-07-08 NOTE — Progress Notes (Addendum)
Patient ID: Linda Cervantes, female   DOB: Jan 27, 1966, 56 y.o.   MRN: [id number] ?BP 127/73   Pulse (!) 115   Temp 98.4 ?F (36.9 ?C) (Axillary)   Resp (!) 33   Ht '5\' 3"'$  (1.6 m)   Wt 51.5 kg   LMP  (LMP Unknown) Comment: approx 2 years   SpO2 92%   BMI 20.11 kg/m?  ?Does not open eyes, does not follow commands ?Intubated, fentanyl is running ?No response to noxious stimuli ?+corneals, +weak cough ?No good reason why she is so lethargic. Recommend head CT in next 24hrs ?Wound is clean, no overt signs of wound infection ?

## 2021-07-08 NOTE — Progress Notes (Addendum)
eLink Physician-Brief Progress Note ?Patient Name: Linda Cervantes ?DOB: Mar 31, 1966 ?MRN: [id number] ? ? ?Date of Service ? 07/27/2021  ?HPI/Events of Note ? 68F with polysubstance abuse, schizophrenia who presented with back pain, difficulty urinating and LE weakness admitted for lumbar epidural abscess. Transferred to [facility] for NSG consult and found to be obtunded an paraplegic. Intubated by anesthesia for MRI. Emergent thoracic laminectomy for abscess drainage ? ?Patient sedated on MV. No pressors ?Currently on PRVC FIO2 60% PEEP5 ?On fentanyl and propofol ?On low dose neo ? ?CXR reviewed. ETT appropriate. Interval R basilar infiltrate ?ABG reviewed. No vent changes ? ?  ?eICU Interventions ? Full vent support ?Vanc, Cefepime ?On low dose neo likely related to sedation. Cannot rule out septic shock. If worsening pressor needs, will switch to levo ?AM labs ordered ?NSG for post-op management  ? ?4:49 AM Worsening hypoxemia. FIO2 increased to 80%. RR over rate. Intermittently high peak pressures to 40s concerning for bronchospasm. On antibiotics. Wheezing per bedside RN. Increase sedation for RASS goal -2 or -3. Give duoneb treatment now with PRN albuterol. Weaned off pressors. ? ?5:26 AM  Worsening hypoxemia. FIO2 increased 100% PEEP 10. ABG 7.36/41/55. CXR ordered - still pending. Lasix 40 mg given ?  ? ?[name] ?07/29/2021, 12:48 AM ?

## 2021-07-08 NOTE — Progress Notes (Signed)
?  [facility] for Infectious Disease ? ? ?Reason for visit: Follow up on epidural abscess ? ?Interval History: s/p operative debridement yesterday by Dr. Arnoldo Morale with laminotomy throughout the thoracic spine.   ?Blood culture now positive for MRSA on BCID; operative culture pending ? ? ?Physical Exam: ?Constitutional:  ?Vitals:  ? 07/09/2021 1145 07/12/2021 1200  ?BP: 115/66 127/73  ?Pulse: (!) 113 (!) 115  ?Resp: (!) 28 (!) 33  ?Temp:  98.4 ?F (36.9 ?C)  ?SpO2: 92% 92%  ?No response ?HENT: + ET ?Respiratory: respiratory effort on ent ? ?Review of Systems: ?Unable to be assessed due to patient factors ? ?Lab Results  ?Component Value Date  ? WBC 17.9 (H) 07/18/2021  ? HGB 11.2 (L) 07/30/2021  ? HCT 33.0 (L) 07/23/2021  ? MCV 79.5 (L) 07/02/2021  ? PLT 218 07/21/2021  ?  ?Lab Results  ?Component Value Date  ? CREATININE 0.55 07/22/2021  ? BUN 11 07/27/2021  ? NA 133 (L) 07/25/2021  ? K 3.8 07/05/2021  ? CL 102 07/05/2021  ? CO2 25 07/29/2021  ?  ?Lab Results  ?Component Value Date  ? ALT 27 07/22/2021  ? AST 22 07/20/2021  ? GGT 10 06/20/2017  ? ALKPHOS 57 07/21/2021  ?  ? ?Microbiology: ?Recent Results (from the past 240 hour(s))  ?Resp Panel by RT-PCR (Flu A&B, Covid) Nasopharyngeal Swab     Status: None  ? Collection Time: 07/02/2021 10:02 PM  ? Specimen: Nasopharyngeal Swab; Nasopharyngeal(NP) swabs in vial transport medium  ?Result Value Ref Range Status  ? SARS Coronavirus 2 by RT PCR NEGATIVE NEGATIVE Final  ?  Comment: (NOTE) ?SARS-CoV-2 target nucleic acids are NOT DETECTED. ? ?The SARS-CoV-2 RNA is generally detectable in upper respiratory ?specimens during the acute phase of infection. The lowest ?concentration of SARS-CoV-2 viral copies this assay can detect is ?138 copies/mL. A negative result does not preclude SARS-Cov-2 ?infection and should not be used as the sole basis for treatment or ?other patient management decisions. A negative result may occur with  ?improper specimen collection/handling,  submission of specimen other ?than nasopharyngeal swab, presence of viral mutation(s) within the ?areas targeted by this assay, and inadequate number of viral ?copies(<138 copies/mL). A negative result must be combined with ?clinical observations, patient history, and epidemiological ?information. The expected result is Negative. ? ?Fact Sheet for Patients:  ?EntrepreneurPulse.com.au ? ?Fact Sheet for Healthcare Providers:  ?IncredibleEmployment.be ? ?This test is no t yet approved or cleared by the [place] FDA and  ?has been authorized for detection and/or diagnosis of SARS-CoV-2 by ?FDA under an Emergency Use Authorization (EUA). This EUA will remain  ?in effect (meaning this test can be used) for the duration of the ?COVID-19 declaration under Section 564(b)(1) of the Act, 21 ?U.S.C.section 360bbb-3(b)(1), unless the authorization is terminated  ?or revoked sooner.  ? ? ?  ? Influenza A by PCR NEGATIVE NEGATIVE Final  ? Influenza B by PCR NEGATIVE NEGATIVE Final  ?  Comment: (NOTE) ?The Xpert Xpress SARS-CoV-2/FLU/RSV plus assay is intended as an aid ?in the diagnosis of influenza from Nasopharyngeal swab specimens and ?should not be used as a sole basis for treatment. Nasal washings and ?aspirates are unacceptable for Xpert Xpress SARS-CoV-2/FLU/RSV ?testing. ? ?Fact Sheet for Patients: ?EntrepreneurPulse.com.au ? ?Fact Sheet for Healthcare Providers: ?IncredibleEmployment.be ? ?This test is not yet approved or cleared by the [place] FDA and ?has been authorized for detection and/or diagnosis of SARS-CoV-2 by ?FDA under an Emergency Use Authorization (EUA).  This EUA will remain ?in effect (meaning this test can be used) for the duration of the ?COVID-19 declaration under Section 564(b)(1) of the Act, 21 U.S.C. ?section 360bbb-3(b)(1), unless the authorization is terminated or ?revoked. ? ?Performed at [facility], [address] ?[place], [place] [address] ?  ?Blood culture (routine x 2)     Status: Abnormal (Preliminary result)  ? Collection Time: 07/15/2021 12:16 AM  ? Specimen: BLOOD LEFT ARM  ?Result Value Ref Range Status  ? Specimen Description   Final  ?  BLOOD LEFT ARM ?Performed at [facility], [place] [address], [place], [place] [address] ?  ? Special Requests   Final  ?  BOTTLES DRAWN AEROBIC AND ANAEROBIC Blood Culture adequate volume ?Performed at [facility], [place] [address], [place], [place] [address] ?  ? Culture  Setup Time   Final  ?  GRAM POSITIVE COCCI IN CLUSTERS ?IN BOTH AEROBIC AND ANAEROBIC BOTTLES ?CRITICAL RESULT CALLED TO, READ BACK BY AND VERIFIED WITH: Hermina Barters '@1745'$  FH ?  ? Culture (A)  Final  ?  STAPHYLOCOCCUS AUREUS ?SUSCEPTIBILITIES TO FOLLOW ?Performed at [facility], [place] [address], [place], [place] [address] ?  ? Report Status PENDING  Incomplete  ?Blood Culture ID Panel (Reflexed)     Status: Abnormal  ? Collection Time: 07/09/2021 12:16 AM  ?Result Value Ref Range Status  ? Enterococcus faecalis NOT DETECTED NOT DETECTED Final  ? Enterococcus Faecium NOT DETECTED NOT DETECTED Final  ? Listeria monocytogenes NOT DETECTED NOT DETECTED Final  ? Staphylococcus species DETECTED (A) NOT DETECTED Final  ?  Comment: CRITICAL RESULT CALLED TO, READ BACK BY AND VERIFIED WITH: ?Hermina Barters '@1745'$  FH ?  ? Staphylococcus aureus (BCID) DETECTED (A) NOT DETECTED Final  ?  Comment: Methicillin (oxacillin)-resistant Staphylococcus aureus (MRSA). MRSA is predictably resistant to beta-lactam antibiotics (except ceftaroline). Preferred therapy is vancomycin unless clinically contraindicated. Patient requires contact precautions if  ?hospitalized. ?CRITICAL RESULT CALLED TO, READ BACK BY AND VERIFIED WITH: ?Hermina Barters '@1745'$  FH ?  ? Staphylococcus epidermidis NOT DETECTED NOT DETECTED Final  ? Staphylococcus lugdunensis NOT  DETECTED NOT DETECTED Final  ? Streptococcus species NOT DETECTED NOT DETECTED Final  ? Streptococcus agalactiae NOT DETECTED NOT DETECTED Final  ? Streptococcus pneumoniae NOT DETECTED NOT DETECTED Final  ? Streptococcus pyogenes NOT DETECTED NOT DETECTED Final  ? A.calcoaceticus-baumannii NOT DETECTED NOT DETECTED Final  ? Bacteroides fragilis NOT DETECTED NOT DETECTED Final  ? Enterobacterales NOT DETECTED NOT DETECTED Final  ? Enterobacter cloacae complex NOT DETECTED NOT DETECTED Final  ? Escherichia coli NOT DETECTED NOT DETECTED Final  ? Klebsiella aerogenes NOT DETECTED NOT DETECTED Final  ? Klebsiella oxytoca NOT DETECTED NOT DETECTED Final  ? Klebsiella pneumoniae NOT DETECTED NOT DETECTED Final  ? Proteus species NOT DETECTED NOT DETECTED Final  ? Salmonella species NOT DETECTED NOT DETECTED Final  ? Serratia marcescens NOT DETECTED NOT DETECTED Final  ? Haemophilus influenzae NOT DETECTED NOT DETECTED Final  ? Neisseria meningitidis NOT DETECTED NOT DETECTED Final  ? Pseudomonas aeruginosa NOT DETECTED NOT DETECTED Final  ? Stenotrophomonas maltophilia NOT DETECTED NOT DETECTED Final  ? Candida albicans NOT DETECTED NOT DETECTED Final  ? Candida auris NOT DETECTED NOT DETECTED Final  ? Candida glabrata NOT DETECTED NOT DETECTED Final  ? Candida krusei NOT DETECTED NOT DETECTED Final  ? Candida parapsilosis NOT DETECTED NOT DETECTED Final  ? Candida tropicalis NOT  DETECTED NOT DETECTED Final  ? Cryptococcus neoformans/gattii NOT DETECTED NOT DETECTED Final  ? Meth resistant mecA/C and MREJ DETECTED (A) NOT DETECTED Final  ?  Comment: CRITICAL RESULT CALLED TO, READ BACK BY AND VERIFIED WITH: ?Hermina Barters '@1745'$  FH ?Performed at [facility], [place] [address], [place], [place] [address] ?  ?Blood culture (routine x 2)     Status: Abnormal (Preliminary result)  ? Collection Time: 07/03/2021  2:51 PM  ? Specimen: BLOOD  ?Result Value Ref Range Status  ? Specimen Description BLOOD LEFT  ANTECUBITAL  Final  ? Special Requests   Final  ?  BOTTLES DRAWN AEROBIC AND ANAEROBIC Blood Culture adequate volume  ? Culture  Setup Time   Final  ?  GRAM POSITIVE COCCI IN CLUSTERS ?IN BOTH AEROBIC AND ANAEROBIC BOTTLES ?CRITI

## 2021-07-08 NOTE — Progress Notes (Signed)
? ?NAME:  Linda Cervantes, MRN:  [id number], DOB:  07/28/1965, LOS: 1 ?ADMISSION DATE:  07/26/2021, CONSULTATION DATE:  07/16/2021 ?REFERRING MD:  Dr. Lonny Prude, CHIEF COMPLAINT:  Respiratory distress   ? ?History of Present Illness:  ?Linda Cervantes is a 56 y.o. female with a PMH significant for IV drug use, substance abuse, paranoid schizophrenia, depression, asthma, anxiety, and back pain who presented to [facility] ED for complaints of new onset mid to low back pain with associated difficulty urinating and possible lower extremity weakness. On admit she met criteria for sepsis and was admitted for further workup per TRH ? 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 Neurosurgery was called and request urgent transfer to [facility] for further imaging and possible need for surgical intervention.  ? ?On arrival to [facility] attempts were made to obtain MRI of thoracic spine but patient did no tolerate flat position and quick displayed signs of respiratory distress. MRI was canceled and PCCM was consulted for possible need of airway support.  ? ?Pertinent  Medical History  ?IV drug use, substance abuse, paranoid schizophrenia, depression, asthma, anxiety, and back pain ? ?Significant Hospital Events: ?Including procedures, antibiotic start and stop dates in addition to other pertinent events   ?4/6 admitted for acute back pain, inability to urinate and possible lower extremity weakness. Found to have large epidural abscess ?4/6 to OR for emergency cervical and thoracic laminectomy ? ?Interim History / Subjective:  ?Overnight returned from OR intubated and requiring high ventilator settings.   ? ?Objective   ?Blood pressure 103/60, pulse 98, temperature 98.2 ?F (36.8 ?C), temperature source Oral, resp. rate (!) 27, height $RemoveBe'5\' 3"'XSAFKyHOG$  (1.6 m), weight 51.5 kg, SpO2 99 %. ?   ?Vent Mode: PRVC ?FiO2 (%):  [40 %-100 %] 80 % ?Set Rate:  [15 bmp]  15 bmp ?Vt Set:  [420 mL-500 mL] 420 mL ?PEEP:  [5 cmH20-10 cmH20] 5 cmH20 ?Plateau Pressure:  [26 cmH20] 26 cmH20  ? ?Intake/Output Summary (Last 24 hours) at 07/06/2021 0826 ?Last data filed at 07/02/2021 0700 ?Gross per 24 hour  ?Intake 2418.83 ml  ?Output 2150 ml  ?Net 268.83 ml  ? ?Filed Weights  ? 07/10/2021 1913 07/19/2021 1924 07/02/2021 0249  ?Weight: 54.4 kg 54.4 kg 51.5 kg  ? ? ?Examination: ?Gen:      Intubated, sedated, acutely ill appearing ?HEENT:  ETT to vent ?Lungs:    sounds of mechanical ventilation auscultated, bilateral end expiratory wheezing ?CV:         tachycardic, regular ?Abd:      + bowel sounds; soft, non-tender; no palpable masses, no distension ?Ext:    No edema, track marks noted on arms ?Skin:      Warm and dry; no rashes ?Neuro:   sedated, RASS -3 ? ?Resolved Hospital Problem list   ? ? ?Assessment & Plan:  ? ?Linda Cervantes is a 56 y.o. woman with history of tobacco and opioid dependence who presents with: ? ?Sepsis secondary to epidural abscess  ?With paraplegia and obtundation ?S/p emergent thoracic laminectomy 07/09/2021 ?-MRI spine on admission revealed epidural abscess extending from T12-L2 measuring 55mm at the thickest with severe spinal cord stenosis at L3-L4 and L4-L5 ?Continue IV Cefepime and Vancomycin  ?Follow culture data ? ?Acute metabolic encephalopathy  ?Likely secondary to infection ?P: ?Neurosurgery consulted and following  ?ID following  ?Maintain neuro protective measures; goal for eurothermia, euglycemia, eunatermia,  normoxia, and PCO2 goal of 35-40 ?Nutrition and bowel regiment  ?Seizure precautions  ?Aspirations precautions  ? ?Acute hypoxemic respiratory failure ?- intubated for respiratory distress and hypoxemia prior to MRI and OR laminectomy ?- chest xray fairly clear with mild RLL atelectasis ?- suspect component of COPD with exacerbation given wheezing ?P: ?Returned from procedure intubated and requiring high ventilator settings.  ?Scheduled nebulizer treatments  today ?Once intubated Ventilator support with lung protective strategies  ?Wean PEEP and FiO2 for sats greater than 90%. ?Head of bed elevated 30 degrees. ?Plateau pressures less than 30 cm H20.  ?Follow intermittent chest x-ray and ABG.   ?SAT/SBT as tolerated, mentation preclude extubation  ?Ensure adequate pulmonary hygiene  ?Follow cultures  ?VAP bundle in place  ?PAD protocol ? ?Circulatory Shock ?- Suspect neurogenic shock post-procedural ?- Titrate down neosynephrine as tolerated  ? ?Metabolic derangements; ?Hyponatremia  ?Hypokalemia  ?P: ?Supplement as needed  ?Trend bmet ?Will advance OG tube and initiate tube feeds ? ?IV drug abuse, polysubstance abuse  ?-UDS positive for cocaine ?P: ?Cessation education when able  ? ?Acute urinary retention  ?P: ?Continue foley for now given critical illness ?At risk for neurogenic bladder ?Will need voiding trial with possible retention meds  ? ?Best Practice (right click and "Reselect all SmartList Selections" daily)  ? ?Diet/type: NPO will start tube feeds today ?DVT prophylaxis: SCD ?GI prophylaxis: PPI ?Lines: [name] ?Foley:  Yes, and it is still needed ?Code Status:  full code ?Last date of multidisciplinary goals of care discussion: I called her son Linda Cervantes and left a message for call back for a general medical update this morning.  ? ?Labs   ?CBC: ?Recent Labs  ?Lab 07/09/2021 ?1928 07/16/2021 ?0500 07/25/2021 ?0021 07/02/2021 ?0254 07/07/2021 ?0175  ?WBC 20.6* 18.6*  --  17.9*  --   ?NEUTROABS 17.2* 15.3*  --   --   --   ?HGB 13.7 11.5* 10.5* 11.0* 11.2*  ?HCT 40.4 34.9* 31.0* 33.4* 33.0*  ?MCV 78.3* 78.6*  --  79.5*  --   ?PLT 235 214  --  218  --   ? ? ?Basic Metabolic Panel: ?Recent Labs  ?Lab 07/02/2021 ?1928 07/27/2021 ?0500 07/06/2021 ?0021 07/14/2021 ?0254 07/20/2021 ?1025  ?NA 130* 129* 134* 134* 133*  ?K 3.2* 3.4* 3.0* 3.5 3.8  ?CL 93* 94*  --  102  --   ?CO2 22 24  --  25  --   ?GLUCOSE 119* 118*  --  110*  --   ?BUN 21* 15  --  11  --   ?CREATININE 0.67 0.62  --  0.55  --    ?CALCIUM 9.8 9.0  --  8.4*  --   ?MG  --  1.7  --  1.7  --   ? ?GFR: ?Estimated Creatinine Clearance: 66.1 mL/min (by C-G formula based on SCr of 0.55 mg/dL). ?Recent Labs  ?Lab 07/03/2021 ?1928 07/26/2021 ?1945 07/03/2021 ?0016 07/19/2021 ?0500 07/07/2021 ?0254  ?WBC 20.6*  --   --  18.6* 17.9*  ?LATICACIDVEN  --  1.9 1.1  --   --   ? ? ?Liver Function Tests: ?Recent Labs  ?Lab 07/11/2021 ?1928 07/23/2021 ?0500 07/04/2021 ?0254  ?AST 53* 29 22  ?ALT 53* 37 27  ?ALKPHOS 85 69 57  ?BILITOT 1.1 0.7 0.9  ?PROT 9.0* 7.4 6.4*  ?ALBUMIN 4.1 3.2* 2.1*  ? ?Recent Labs  ?Lab 07/05/2021 ?1928  ?LIPASE 27  ? ?No results for input(s): AMMONIA in the last 168 hours. ? ?ABG ?   ?  Component Value Date/Time  ? PHART 7.361 07/16/2021 0524  ? PCO2ART 41.1 07/01/2021 0524  ? PO2ART 55 (L) 07/07/2021 0524  ? HCO3 23.3 07/13/2021 0524  ? TCO2 24 07/03/2021 0524  ? ACIDBASEDEF 2.0 07/02/2021 0524  ? O2SAT 87 07/30/2021 0524  ?  ? ?Coagulation Profile: ?Recent Labs  ?Lab 07/03/2021 ?0500  ?INR 1.0  ? ? ?Critical care time: 37 mins  ? ?The patient is critically ill due to respiratory failure, shock.  Critical care was necessary to treat or prevent imminent or life-threatening deterioration.  Critical care was time spent personally by me on the following activities: development of treatment plan with patient and/or surrogate as well as nursing, discussions with consultants, evaluation of patient's response to treatment, examination of patient, obtaining history from patient or surrogate, ordering and performing treatments and interventions, ordering and review of laboratory studies, ordering and review of radiographic studies, pulse oximetry, re-evaluation of patient's condition and participation in multidisciplinary rounds.  ? ?Critical Care Time devoted to patient care services described in this note is 37 minutes. This time reflects time of care of this [place] . This critical care time does not reflect separately billable procedures or  procedure time, teaching time or supervisory time of PA/NP/Med student/Med Resident etc but could involve care discussion time.   ?   ? ?Spero Geralds ?[facility] ?07/08/2021 8:26

## 2021-07-08 NOTE — Progress Notes (Signed)
eLink Physician-Brief Progress Note ?Patient Name: Linda Cervantes ?DOB: 09/02/1965 ?MRN: [id number] ? ? ?Date of Service ? 07/02/2021  ?HPI/Events of Note ? Temp 103.2 with allergy to tylenol (reaction unknown to significant other), Pt has NG/OG tube. ? ?Also Pt has order for Q4 CBGs and no SSI ordered. Pt not diabetic.  ?eICU Interventions ? Conferred with pharmacy, ok to give Tylenol given true allergy highly unusual ? ?Ordered SSI  ? ? ? ?Intervention Category ?Intermediate Interventions: Other: ? ?Judd Lien ?07/09/2021, 10:02 PM ?

## 2021-07-08 NOTE — Anesthesia Postprocedure Evaluation (Signed)
Anesthesia Post Note ? ?Patient: Linda Cervantes ? ?Procedure(s) Performed: MRI WITH ANESTHESIA ? ?  ? ?Patient location during evaluation: SICU ?Anesthesia Type: General ?Level of consciousness: sedated ?Pain management: pain level controlled ?Vital Signs Assessment: post-procedure vital signs reviewed and stable ?Respiratory status: patient remains intubated per anesthesia plan ?Cardiovascular status: stable ?Postop Assessment: no apparent nausea or vomiting ?Anesthetic complications: no ? ? ?No notable events documented. ? ?Last Vitals:  ?Vitals:  ? 07/05/2021 0000 07/12/2021 0031  ?BP:  103/67  ?Pulse:  (!) 57  ?Resp:  15  ?Temp:    ?SpO2: 100% 95%  ?  ?Last Pain:  ?Vitals:  ? 07/04/2021 1701  ?TempSrc: Oral  ?PainSc:   ? ? ?  ?  ?  ?  ?  ?  ? ?Hayato Guaman P Merel Santoli ? ? ? ? ?

## 2021-07-08 NOTE — Progress Notes (Signed)
?  Echocardiogram ?2D Echocardiogram has been performed. ? ?Linda Cervantes ?07/16/2021, 2:40 PM ?

## 2021-07-08 NOTE — Progress Notes (Signed)
PHARMACY - PHYSICIAN COMMUNICATION ?CRITICAL VALUE ALERT - BLOOD CULTURE IDENTIFICATION (BCID) ? ?Linda Cervantes is an 56 y.o. female who presented to [facility] on 07/25/2021 with a chief complaint of lower back pain. ? ?Assessment:  Dorsal epidural abscess extending from T12-L2 / spinal cord compression; culture pending.  BCx grew MRSA. ? ?Name of physician (or Provider) Contacted: Dr. Shearon Stalls ? ?Current antibiotics: vancomycin and cefepime ? ?Changes to prescribed antibiotics recommended:  ?Recommendations accepted by provider, D/C cefepime ? ?Results for orders placed or performed during the hospital encounter of 07/26/2021  ?Blood Culture ID Panel (Reflexed) (Collected: 07/26/2021  2:51 PM)  ?Result Value Ref Range  ? Enterococcus faecalis NOT DETECTED NOT DETECTED  ? Enterococcus Faecium NOT DETECTED NOT DETECTED  ? Listeria monocytogenes NOT DETECTED NOT DETECTED  ? Staphylococcus species DETECTED (A) NOT DETECTED  ? Staphylococcus aureus (BCID) DETECTED (A) NOT DETECTED  ? Staphylococcus epidermidis NOT DETECTED NOT DETECTED  ? Staphylococcus lugdunensis NOT DETECTED NOT DETECTED  ? Streptococcus species NOT DETECTED NOT DETECTED  ? Streptococcus agalactiae NOT DETECTED NOT DETECTED  ? Streptococcus pneumoniae NOT DETECTED NOT DETECTED  ? Streptococcus pyogenes NOT DETECTED NOT DETECTED  ? A.calcoaceticus-baumannii NOT DETECTED NOT DETECTED  ? Bacteroides fragilis NOT DETECTED NOT DETECTED  ? Enterobacterales NOT DETECTED NOT DETECTED  ? Enterobacter cloacae complex NOT DETECTED NOT DETECTED  ? Escherichia coli NOT DETECTED NOT DETECTED  ? Klebsiella aerogenes NOT DETECTED NOT DETECTED  ? Klebsiella oxytoca NOT DETECTED NOT DETECTED  ? Klebsiella pneumoniae NOT DETECTED NOT DETECTED  ? Proteus species NOT DETECTED NOT DETECTED  ? Salmonella species NOT DETECTED NOT DETECTED  ? Serratia marcescens NOT DETECTED NOT DETECTED  ? Haemophilus influenzae NOT DETECTED NOT DETECTED  ? Neisseria meningitidis NOT DETECTED  NOT DETECTED  ? Pseudomonas aeruginosa NOT DETECTED NOT DETECTED  ? Stenotrophomonas maltophilia NOT DETECTED NOT DETECTED  ? Candida albicans NOT DETECTED NOT DETECTED  ? Candida auris NOT DETECTED NOT DETECTED  ? Candida glabrata NOT DETECTED NOT DETECTED  ? Candida krusei NOT DETECTED NOT DETECTED  ? Candida parapsilosis NOT DETECTED NOT DETECTED  ? Candida tropicalis NOT DETECTED NOT DETECTED  ? Cryptococcus neoformans/gattii NOT DETECTED NOT DETECTED  ? Meth resistant mecA/C and MREJ DETECTED (A) NOT DETECTED  ? ? ?Miro Balderson D. Mina Marble, PharmD, BCPS, BCCCP ?07/16/2021, 1:00 PM ? ?

## 2021-07-08 NOTE — Progress Notes (Signed)
Initial Nutrition Assessment ? ?DOCUMENTATION CODES:  ? ?Not applicable ? ?INTERVENTION:  ? ?Initiate TF via OGT:  ? ?Vital 1.5 @ 40 ml/hr (960 ml daily) ? ?45 ml Prosource TF daily.   ? ?Tube feeding regimen provides 1480 kcals, 76 grams of protein, and 733 ml of H2O.   ? ?NUTRITION DIAGNOSIS:  ? ?Inadequate oral intake related to inability to eat as evidenced by NPO status. ? ?GOAL:  ? ?Patient will meet greater than or equal to 90% of their needs ? ?MONITOR:  ? ?Vent status, Labs, Weight trends, TF tolerance, Skin, I & O's ? ?REASON FOR ASSESSMENT:  ? ?Consult, Malnutrition Screening Tool, Ventilator ?Enteral/tube feeding initiation and management ? ?ASSESSMENT:  ? ?Linda Cervantes is a 56 y.o. female with a PMH significant for IV drug use, substance abuse, paranoid schizophrenia, depression, asthma, anxiety, and back pain who presented to [facility] ED for complaints of new onset mid to low back pain with associated difficulty urinating and possible lower extremity weakness. On admit she met criteria for sepsis and was admitted for further workup per TRH ? ?Pt admitted with thoracic and lumbar epidural abscess and paraplegia.  ? ?4/7- s/p Procedure: Left T2-3, T3-4, T4-5, T5-6, T6-7, T7-8, T8-9, T9-10 and T10-11 laminotomy for evacuation of epidural abscess ? ?Patient is currently intubated on ventilator support. OGT currently clamped; placement verified by x-ray. ?MV: 11.4 L/min ?Temp (24hrs), Avg:98.4 ?F (36.9 ?C), Min:97.9 ?F (36.6 ?C), Max:98.8 ?F (37.1 ?C) ? ?MAP: 89 ? ?Reviewed I/O's: +771 ml x 24 hours ? ?UOP: 2 L x 24 hours ? ?Propofol d/c earlier this morning. Per MD, plan to start TF today.  ? ?Reviewed wt hx; pt has experienced a 6.2% wt loss over the past 4 months, which is not significant for time frame.  ?  ?Medications reviewed and include colace, miralax, and neo-synephrine. ? ?Lab Results  ?Component Value Date  ? HGBA1C 5.3 02/04/2018  ? PTA DM medications are none.  ? ?Labs reviewed: Na: 133, CBGS:  125 (inpatient orders for glycemic control are none).   ? ?Diet Order:   ?Diet Order   ? ?       ?  Diet NPO time specified  Diet effective now       ?  ? ?  ?  ? ?  ? ? ?EDUCATION NEEDS:  ? ?Not appropriate for education at this time ? ?Skin:  Skin Assessment: Skin Integrity Issues: ?Skin Integrity Issues:: Incisions ?Incisions: closed back ? ?Last BM:  Unknown ? ?Height:  ? ?Ht Readings from Last 1 Encounters:  ?07/22/2021 $RemoveBe'5\' 3"'[id number]$  (1.6 m)  ? ? ?Weight:  ? ?Wt Readings from Last 1 Encounters:  ?07/10/2021 51.5 kg  ? ?BMI:  Body mass index is 20.11 kg/m?. ? ?Estimated Nutritional Needs:  ? ?Kcal:  1386 ? ?Protein:  80-95 grams ? ?Fluid:  > 1.3 L ? ? ? ?Loistine Chance, RD, LDN, CDCES ?Registered Dietitian II ?Certified Diabetes Care and Education Specialist ?Please refer to [facility] for RD and/or RD on-call/weekend/after hours pager  ?

## 2021-07-08 NOTE — Progress Notes (Signed)
[facility] ADULT ICU REPLACEMENT PROTOCOL ? ? ?The patient does apply for the [facility] Adult ICU Electrolyte Replacment Protocol based on the criteria listed below:  ? ?1.Exclusion criteria: TCTS patients, ECMO patients, and Dialysis patients ?2. Is GFR >/= 30 ml/min? Yes.    ?Patient's GFR today is >60 ?3. Is SCr </= 2? Yes.   ?Patient's SCr is 0.55 mg/dL ?4. Did SCr increase >/= 0.5 in 24 hours? No. ?5.Pt's weight >40kg  Yes.   ?6. Abnormal electrolyte(s): Mg 1.7  ?7. Electrolytes replaced per protocol ?8.  Call MD STAT for K+ </= 2.5, Phos </= 1, or Mag </= 1 ?Physician:  Loanne Drilling  ? ?Linda Cervantes 07/21/2021 4:00 AM  ?

## 2021-07-08 NOTE — Progress Notes (Signed)
Received pt intubated, implemented MD orders, Significant other informed of pt's presence in the room.  ?

## 2021-07-08 NOTE — Progress Notes (Signed)
CCM notified of Pt's temp of 103.2 axillary ?

## 2021-07-09 ENCOUNTER — Encounter (HOSPITAL_COMMUNITY): Payer: Self-pay | Admitting: Neurosurgery

## 2021-07-09 DIAGNOSIS — G062 Extradural and subdural abscess, unspecified: Secondary | ICD-10-CM | POA: Diagnosis not present

## 2021-07-09 LAB — CULTURE, BLOOD (ROUTINE X 2): Special Requests: ADEQUATE

## 2021-07-09 LAB — GLUCOSE, CAPILLARY
Glucose-Capillary: 205 mg/dL — ABNORMAL HIGH (ref 70–99)
Glucose-Capillary: 175 mg/dL — ABNORMAL HIGH (ref 70–99)
Glucose-Capillary: 158 mg/dL — ABNORMAL HIGH (ref 70–99)
Glucose-Capillary: 134 mg/dL — ABNORMAL HIGH (ref 70–99)
Glucose-Capillary: 128 mg/dL — ABNORMAL HIGH (ref 70–99)
Glucose-Capillary: 105 mg/dL — ABNORMAL HIGH (ref 70–99)

## 2021-07-09 LAB — BASIC METABOLIC PANEL
Anion gap: 6 (ref 5–15)
BUN: 24 mg/dL — ABNORMAL HIGH (ref 6–20)
CO2: 26 mmol/L (ref 22–32)
Calcium: 8.4 mg/dL — ABNORMAL LOW (ref 8.9–10.3)
Chloride: 104 mmol/L (ref 98–111)
Creatinine, Ser: 0.63 mg/dL (ref 0.44–1.00)
GFR, Estimated: >60 mL/min (ref >60)
Glucose, Bld: 239 mg/dL — ABNORMAL HIGH (ref 70–99)
Potassium: 3.3 mmol/L — ABNORMAL LOW (ref 3.5–5.1)
Sodium: 136 mmol/L (ref 135–145)

## 2021-07-09 LAB — CBC
HCT: 32.5 % — ABNORMAL LOW (ref 36.0–46.0)
Hemoglobin: 10.2 g/dL — ABNORMAL LOW (ref 12.0–15.0)
MCH: 26.1 pg (ref 26.0–34.0)
MCHC: 31.4 g/dL (ref 30.0–36.0)
MCV: 83.1 fL (ref 80.0–100.0)
Platelets: 260 10*3/uL (ref 150–400)
RBC: 3.91 MIL/uL (ref 3.87–5.11)
RDW: 14.5 % (ref 11.5–15.5)
WBC: 15.4 10*3/uL — ABNORMAL HIGH (ref 4.0–10.5)
nRBC: 0 % (ref 0.0–0.2)

## 2021-07-09 LAB — MAGNESIUM
Magnesium: 2.6 mg/dL — ABNORMAL HIGH (ref 1.7–2.4)
Magnesium: 2.2 mg/dL (ref 1.7–2.4)

## 2021-07-09 LAB — PHOSPHORUS
Phosphorus: 2.3 mg/dL — ABNORMAL LOW (ref 2.5–4.6)
Phosphorus: 2.3 mg/dL — ABNORMAL LOW (ref 2.5–4.6)

## 2021-07-09 LAB — HCV RNA QUANT
HCV Quantitative Log: 5.34 log10 IU/mL (ref >1.70)
HCV Quantitative: 219000 IU/mL (ref >50)

## 2021-07-09 MED ORDER — POTASSIUM PHOSPHATES 15 MMOLE/5ML IV SOLN
15.0000 mmol | Freq: Once | INTRAVENOUS | Status: AC
  Administered 2021-07-09: 15 mmol via INTRAVENOUS
  Filled 2021-07-09: qty 5

## 2021-07-09 MED ORDER — PANTOPRAZOLE 2 MG/ML SUSPENSION
40.0000 mg | Freq: Every day | ORAL | Status: DC
  Administered 2021-07-09 – 2021-07-10 (×2): 40 mg
  Filled 2021-07-09 (×2): qty 20

## 2021-07-09 MED ORDER — DEXMEDETOMIDINE HCL IN NACL 400 MCG/100ML IV SOLN
0.4000 ug/kg/h | INTRAVENOUS | Status: DC
  Administered 2021-07-09: 1 ug/kg/h via INTRAVENOUS
  Administered 2021-07-09 – 2021-07-10 (×2): 0.4 ug/kg/h via INTRAVENOUS
  Administered 2021-07-10: 0.8 ug/kg/h via INTRAVENOUS
  Administered 2021-07-10: 1 ug/kg/h via INTRAVENOUS
  Administered 2021-07-10 – 2021-07-11 (×5): 1.2 ug/kg/h via INTRAVENOUS
  Administered 2021-07-12 – 2021-07-13 (×2): 1 ug/kg/h via INTRAVENOUS
  Administered 2021-07-13: 0.9 ug/kg/h via INTRAVENOUS
  Administered 2021-07-14: 1 ug/kg/h via INTRAVENOUS
  Filled 2021-07-09 (×14): qty 100

## 2021-07-09 MED ORDER — METHADONE HCL 10 MG PO TABS
60.0000 mg | ORAL_TABLET | Freq: Every day | ORAL | Status: DC
  Administered 2021-07-09: 60 mg
  Filled 2021-07-09 (×2): qty 6

## 2021-07-09 MED ORDER — ENOXAPARIN SODIUM 40 MG/0.4ML IJ SOSY
40.0000 mg | PREFILLED_SYRINGE | Freq: Every day | INTRAMUSCULAR | Status: DC
  Administered 2021-07-09: 40 mg via SUBCUTANEOUS
  Filled 2021-07-09: qty 0.4

## 2021-07-09 MED ORDER — LABETALOL HCL 5 MG/ML IV SOLN
10.0000 mg | INTRAVENOUS | Status: DC | PRN
  Administered 2021-07-09 – 2021-07-11 (×5): 10 mg via INTRAVENOUS
  Filled 2021-07-09 (×6): qty 4

## 2021-07-09 MED ORDER — INSULIN ASPART 100 UNIT/ML IJ SOLN
0.0000 [IU] | INTRAMUSCULAR | Status: DC
  Administered 2021-07-09: 3 [IU] via SUBCUTANEOUS
  Administered 2021-07-09 – 2021-07-12 (×7): 2 [IU] via SUBCUTANEOUS
  Administered 2021-07-13 (×2): 3 [IU] via SUBCUTANEOUS
  Administered 2021-07-13 (×3): 2 [IU] via SUBCUTANEOUS
  Administered 2021-07-13: 3 [IU] via SUBCUTANEOUS
  Administered 2021-07-14 (×3): 2 [IU] via SUBCUTANEOUS

## 2021-07-09 NOTE — Progress Notes (Signed)
eLink Physician-Brief Progress Note ?Patient Name: Linda Cervantes ?DOB: 04-04-1965 ?MRN: [id number] ? ? ?Date of Service ? 07/09/2021  ?HPI/Events of Note ? Pt tachycardic and hypertensive despite precedex at 1.32mg/kg/hr ?She was also given due antihypertensives.   ?eICU Interventions ? Will give PRN fentanyl for additional pain control/sedation. Will monitor response.  ?Will consider restarting fentanyl drip.   ? ? ? ?  ? ?ASanibel?07/09/2021, 8:18 PM ?

## 2021-07-09 NOTE — Progress Notes (Addendum)
? ?NAME:  Linda Cervantes, MRN:  [id number], DOB:  1965-12-06, LOS: 2 ?ADMISSION DATE:  07/19/2021, CONSULTATION DATE:  07/13/2021 ?REFERRING MD:  Dr. Lonny Prude, CHIEF COMPLAINT:  Respiratory distress   ? ?History of Present Illness:  ?Linda Cervantes is a 56 y.o. female with a PMH significant for IV drug use, substance abuse, paranoid schizophrenia, depression, asthma, anxiety, and back pain who presented to [facility] ED for complaints of new onset mid to low back pain with associated difficulty urinating and possible lower extremity weakness. On admit she met criteria for sepsis and was admitted for further workup per [facility] ? ?MRI spine on admission revealed epidural abscess extending from T12-L2 measuring 55mm at the thickest with severe spinal cord stenosis at L3-L4 and L4-L5. Neurosurgery was called and request urgent transfer to [facility] for further imaging and possible need for surgical intervention.  ? ?On arrival to [facility] attempts were made to obtain MRI of thoracic spine but patient did no tolerate flat position and quick displayed signs of respiratory distress. MRI was canceled and PCCM was consulted for possible need of airway support.  ? ?Pertinent  Medical History  ?IV drug use, substance abuse, paranoid schizophrenia, depression, asthma, anxiety, and back pain ? ?Significant Hospital Events: ?Including procedures, antibiotic start and stop dates in addition to other pertinent events   ?4/6 admitted for acute back pain, inability to urinate and possible lower extremity weakness. Found to have large epidural abscess ?4/6 to OR for emergency cervical and thoracic laminectomy ? ?Interim History / Subjective:  ?Overnight returned from OR intubated and requiring high ventilator settings.   ? ?Objective   ?Blood pressure 135/76, pulse 77, temperature (!) 96.4 ?F (35.8 ?C), resp. rate (!) 24, height $RemoveBe'5\' 3"'vzmbsWiBs$  (1.6 m), weight 55.1 kg, SpO2 100 %. ?   ?Vent Mode: PRVC ?FiO2 (%):  [40 %-70 %] 40 % ?Set Rate:  [15 bmp] 15 bmp ?Vt Set:  [420  mL] 420 mL ?PEEP:  [5 cmH20-10 cmH20] 5 cmH20 ?Plateau Pressure:  [24 cmH20] 24 cmH20  ? ?Intake/Output Summary (Last 24 hours) at 07/09/2021 1001 ?Last data filed at 07/09/2021 0800 ?Gross per 24 hour  ?Intake 970.39 ml  ?Output 1375 ml  ?Net -404.61 ml  ? ?Filed Weights  ? 07/03/2021 1924 07/04/2021 0249 07/09/21 0423  ?Weight: 54.4 kg 51.5 kg 55.1 kg  ? ? ?Examination: ?Gen:      Intubated, sedated, acutely ill appearing ?HEENT:  ETT to vent ?Lungs:    sounds of mechanical ventilation auscultated, bilateral end expiratory wheezing ?CV:         tachycardic, regular ?Abd:      + bowel sounds; soft, non-tender; no palpable masses, no distension ?Ext:    No edema, track marks noted on arms ?Skin:      Warm and dry; no rashes ?Neuro:   sedated, RASS -3 ? ?Resolved Hospital Problem list   ?Circulatory shock ? ?Assessment & Plan:  ? ?Linda Cervantes is a 56 y.o. woman with history of tobacco and opioid dependence who presents with: ? 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 Appreciate ID recs.  ? ?Acute metabolic encephalopathy  ?Likely secondary to infection ?P: ?Neurosurgery consulted and following  ?ID following  ?Maintain neuro protective measures;  goal for eurothermia, euglycemia, eunatermia, normoxia, and PCO2 goal of 35-40 ?Nutrition and bowel regiment  ?Seizure precautions  ?Aspirations precautions  ? ?Acute hypoxemic respiratory failure ?- intubated for respiratory distress and hypoxemia prior to MRI and OR laminectomy ?- chest xray fairly clear with mild RLL atelectasis ?- suspect component of COPD with exacerbation given wheezing ?P: ?Continue prn nebs ?Coming down on ventilator settings. Wean PEEP and FiO2 for sats  greater than 90%. ?support with lung protective strategies  ?Will plan for SAT/SBT today. She is much more alert than yesterday ?Head of bed elevated 30 degrees. ?Plateau pressures less than 30 cm H20.  ?Follow intermittent chest x-ray and ABG.   ?Ensure adequate pulmonary hygiene  ?Follow cultures  ?VAP bundle in place  ?PAD protocol ? ?Metabolic derangements; ?Hyponatremia  ?Hypokalemia  ?P: ?Supplement as needed  ?Trend bmet ?Will advance OG tube and initiate tube feeds ? ?IV drug abuse, polysubstance abuse  ?-UDS positive for cocaine ?P: ?Cessation education when able  ?Will  obtain EKG again today. She is on methadone at home but this and other psychotropic medications are held due to encephalopathy and prolonged Qtc. Will start to add back in as tolerated.  ? ?Acute urinary retention  ?P: ?Continue foley for now given critical illness ?At risk for neurogenic bladder ?Will need voiding trial with possible retention meds  ? ?Best Practice (right click and "Reselect all SmartList Selections" daily)  ? ?Diet/type: NPO with tube feeds. ?DVT prophylaxis: SCD ?GI prophylaxis: PPI ?Lines: [name] ?Foley:  Yes, and it is still needed ?Code Status:  full code ?Last date of multidisciplinary goals of care discussion: I called her Significant other Marjory Lies on 07/09/21 and gave him an update.  ? ?Labs   ?CBC: ?Recent Labs  ?Lab 07/09/2021 ?1928 07/26/2021 ?0500 07/19/2021 ?0021 07/04/2021 ?0254 07/21/2021 ?6384 07/09/21 ?0441  ?WBC 20.6* 18.6*  --  17.9*  --  15.4*  ?NEUTROABS 17.2* 15.3*  --   --   --   --   ?HGB 13.7 11.5* 10.5* 11.0* 11.2* 10.2*  ?HCT 40.4 34.9* 31.0* 33.4* 33.0* 32.5*  ?MCV 78.3* 78.6*  --  79.5*  --  83.1  ?PLT 235 214  --  218  --  260  ? ? ?Basic Metabolic Panel: ?Recent Labs  ?Lab 07/27/2021 ?1928 07/04/2021 ?0500 07/04/2021 ?0021 07/28/2021 ?0254 07/25/2021 ?5364 07/02/2021 ?1648 07/09/21 ?0441  ?NA 130* 129* 134* 134* 133*  --  136  ?K 3.2* 3.4* 3.0* 3.5 3.8  --  3.3*  ?CL 93* 94*  --  102  --   --  104  ?CO2 22 24  --  25   --   --  26  ?GLUCOSE 119* 118*  --  110*  --   --  239*  ?BUN 21* 15  --  11  --   --  24*  ?CREATININE 0.67 0.62  --  0.55  --   --  0.63  ?CALCIUM 9.8 9.0  --  8.4*  --   --  8.4*  ?MG  --  1.7  --  1.7  --  2.6* 2.6*  ?PHOS  --   --   --   --   --  3.6 2.3*  ? ?GFR: ?Estimated Creatinine Clearance: 67.3 mL/min (by C-G formula based on SCr of 0.63 mg/dL). ?Recent Labs  ?Lab 07/01/2021 ?1928 07/09/2021 ?1945 07/11/2021 ?0016 07/12/2021 ?0500 07/29/2021 ?0254 07/09/21 ?0441  ?WBC 20.6*  --   --  18.6* 17.9* 15.4*  ?  LATICACIDVEN  --  1.9 1.1  --   --   --   ? ? ?Liver Function Tests: ?Recent Labs  ?Lab 07/16/2021 ?1928 07/12/2021 ?0500 07/02/2021 ?0254  ?AST 53* 29 22  ?ALT 53* 37 27  ?ALKPHOS 85 69 57  ?BILITOT 1.1 0.7 0.9  ?PROT 9.0* 7.4 6.4*  ?ALBUMIN 4.1 3.2* 2.1*  ? ?Recent Labs  ?Lab 07/04/2021 ?1928  ?LIPASE 27  ? ?No results for input(s): AMMONIA in the last 168 hours. ? ?ABG ?   ?Component Value Date/Time  ? PHART 7.361 07/16/2021 0524  ? PCO2ART 41.1 07/28/2021 0524  ? PO2ART 55 (L) 07/28/2021 0524  ? HCO3 23.3 07/11/2021 0524  ? TCO2 24 07/26/2021 0524  ? ACIDBASEDEF 2.0 07/16/2021 0524  ? O2SAT 87 07/01/2021 0524  ?  ? ?Coagulation Profile: ?Recent Labs  ?Lab 07/06/2021 ?0500  ?INR 1.0  ? ? ?Critical care time: 36 mins  ? ?The patient is critically ill due to respiratory failure, encephalopathy.  Critical care was necessary to treat or prevent imminent or life-threatening deterioration.  Critical care was time spent personally by me on the following activities: development of treatment plan with patient and/or surrogate as well as nursing, discussions with consultants, evaluation of patient's response to treatment, examination of patient, obtaining history from patient or surrogate, ordering and performing treatments and interventions, ordering and review of laboratory studies, ordering and review of radiographic studies, pulse oximetry, re-evaluation of patient's condition and participation in multidisciplinary rounds.   ? ?Critical Care Time devoted to patient care services described in this note is 35 minutes. This time reflects time of care of this [place] . This critical care time does not reflect sepa

## 2021-07-09 NOTE — Progress Notes (Signed)
[facility] ADULT ICU REPLACEMENT PROTOCOL ? ? ?The patient does apply for the [facility] Adult ICU Electrolyte Replacment Protocol based on the criteria listed below:  ? ?1.Exclusion criteria: TCTS patients, ECMO patients, and Dialysis patients ?2. Is GFR >/= 30 ml/min? Yes.    ?Patient's GFR today is > 60 ?3. Is SCr </= 2? Yes.   ?Patient's SCr is 0.63 mg/dL ?4. Did SCr increase >/= 0.5 in 24 hours? No. ?5.Pt's weight >40kg  Yes.   ?6. Abnormal electrolyte(s): K+ 3.3, Phos 2.3  ?7. Electrolytes replaced per protocol ?8.  Call MD STAT for K+ </= 2.5, Phos </= 1, or Mag </= 1 ?Physician:  Dr Genevive Bi ? ?Sigmund Hazel 07/09/2021 6:48 AM  ?

## 2021-07-09 NOTE — Progress Notes (Signed)
Patient ID: Linda Cervantes, female   DOB: 1966-03-15, 56 y.o.   MRN: [id number] ?BP 128/75   Pulse 75   Temp (!) 96.4 ?F (35.8 ?C)   Resp (!) 26   Ht '5\' 3"'$  (1.6 m)   Wt 55.1 kg   LMP  (LMP Unknown) Comment: approx 2 years   SpO2 97%   BMI 21.52 kg/m?  ?Sedated at this time. Eye flickering to voice. Is not following commands for me ?Corneals, pupils reactive to light ?No other changes at this time.  ?

## 2021-07-10 ENCOUNTER — Inpatient Hospital Stay (HOSPITAL_COMMUNITY): Payer: 59

## 2021-07-10 ENCOUNTER — Inpatient Hospital Stay (HOSPITAL_COMMUNITY): Payer: 59 | Admitting: Anesthesiology

## 2021-07-10 ENCOUNTER — Encounter (HOSPITAL_COMMUNITY): Admission: EM | Disposition: E | Payer: Self-pay | Source: Home / Self Care | Attending: Internal Medicine

## 2021-07-10 ENCOUNTER — Other Ambulatory Visit: Payer: Self-pay

## 2021-07-10 DIAGNOSIS — G061 Intraspinal abscess and granuloma: Secondary | ICD-10-CM | POA: Diagnosis not present

## 2021-07-10 DIAGNOSIS — J449 Chronic obstructive pulmonary disease, unspecified: Secondary | ICD-10-CM

## 2021-07-10 DIAGNOSIS — F418 Other specified anxiety disorders: Secondary | ICD-10-CM

## 2021-07-10 DIAGNOSIS — G8389 Other specified paralytic syndromes: Secondary | ICD-10-CM | POA: Diagnosis not present

## 2021-07-10 DIAGNOSIS — G062 Extradural and subdural abscess, unspecified: Secondary | ICD-10-CM | POA: Diagnosis not present

## 2021-07-10 HISTORY — PX: POSTERIOR CERVICAL LAMINECTOMY: SHX2248

## 2021-07-10 LAB — BASIC METABOLIC PANEL
Anion gap: 5 (ref 5–15)
BUN: 16 mg/dL (ref 6–20)
CO2: 27 mmol/L (ref 22–32)
Calcium: 8.5 mg/dL — ABNORMAL LOW (ref 8.9–10.3)
Chloride: 108 mmol/L (ref 98–111)
Creatinine, Ser: 0.55 mg/dL (ref 0.44–1.00)
GFR, Estimated: >60 mL/min (ref >60)
Glucose, Bld: 169 mg/dL — ABNORMAL HIGH (ref 70–99)
Potassium: 4.3 mmol/L (ref 3.5–5.1)
Sodium: 140 mmol/L (ref 135–145)

## 2021-07-10 LAB — HEMOGLOBIN AND HEMATOCRIT, BLOOD
HCT: 31.8 % — ABNORMAL LOW (ref 36.0–46.0)
Hemoglobin: 9.8 g/dL — ABNORMAL LOW (ref 12.0–15.0)

## 2021-07-10 LAB — GLUCOSE, CAPILLARY
Glucose-Capillary: 145 mg/dL — ABNORMAL HIGH (ref 70–99)
Glucose-Capillary: 136 mg/dL — ABNORMAL HIGH (ref 70–99)
Glucose-Capillary: 111 mg/dL — ABNORMAL HIGH (ref 70–99)
Glucose-Capillary: 105 mg/dL — ABNORMAL HIGH (ref 70–99)
Glucose-Capillary: 122 mg/dL — ABNORMAL HIGH (ref 70–99)
Glucose-Capillary: 96 mg/dL (ref 70–99)

## 2021-07-10 LAB — CULTURE, BLOOD (ROUTINE X 2)
Special Requests: ADEQUATE
Special Requests: ADEQUATE

## 2021-07-10 LAB — PREPARE RBC (CROSSMATCH)

## 2021-07-10 LAB — CBC
HCT: 31 % — ABNORMAL LOW (ref 36.0–46.0)
Hemoglobin: 9.8 g/dL — ABNORMAL LOW (ref 12.0–15.0)
MCH: 26.1 pg (ref 26.0–34.0)
MCHC: 31.6 g/dL (ref 30.0–36.0)
MCV: 82.7 fL (ref 80.0–100.0)
Platelets: 239 10*3/uL (ref 150–400)
RBC: 3.75 MIL/uL — ABNORMAL LOW (ref 3.87–5.11)
RDW: 14.6 % (ref 11.5–15.5)
WBC: 12.8 10*3/uL — ABNORMAL HIGH (ref 4.0–10.5)
nRBC: 0 % (ref 0.0–0.2)

## 2021-07-10 LAB — POCT I-STAT 7, (LYTES, BLD GAS, ICA,H+H)
Acid-base deficit: 4 mmol/L — ABNORMAL HIGH (ref 0.0–2.0)
Bicarbonate: 20.5 mmol/L (ref 20.0–28.0)
Calcium, Ion: 0.99 mmol/L — ABNORMAL LOW (ref 1.15–1.40)
HCT: 21 % — ABNORMAL LOW (ref 36.0–46.0)
Hemoglobin: 7.1 g/dL — ABNORMAL LOW (ref 12.0–15.0)
O2 Saturation: 99 %
Potassium: 3.2 mmol/L — ABNORMAL LOW (ref 3.5–5.1)
Sodium: 148 mmol/L — ABNORMAL HIGH (ref 135–145)
TCO2: 22 mmol/L (ref 22–32)
pCO2 arterial: 33.4 mmHg (ref 32–48)
pH, Arterial: 7.397 (ref 7.35–7.45)
pO2, Arterial: 125 mmHg — ABNORMAL HIGH (ref 83–108)

## 2021-07-10 LAB — HEPATITIS A ANTIBODY, TOTAL: hep A Total Ab: NONREACTIVE

## 2021-07-10 LAB — GC/CHLAMYDIA PROBE AMP (~~LOC~~) NOT AT ARMC
Chlamydia: NEGATIVE
Comment: NEGATIVE
Comment: NORMAL
Neisseria Gonorrhea: NEGATIVE

## 2021-07-10 LAB — VANCOMYCIN, PEAK: Vancomycin Pk: 17 ug/mL — ABNORMAL LOW (ref 30–40)

## 2021-07-10 IMAGING — MR MR CERVICAL SPINE WO/W CM
4 of 10 series · 18 of 48 positions shown · IV contrast (5.5ml gadavist)
Comparison: Thoracic spine MRI [DATE]

CLINICAL DATA: Epidural abscess. Status post extensive thoracic
laminotomies for epidural abscess evacuation on [DATE].

EXAM:
MRI CERVICAL SPINE WITHOUT AND WITH CONTRAST
TECHNIQUE: Multiplanar and multiecho pulse sequences of the cervical spine, to
include the craniocervical junction and cervicothoracic junction,
were obtained without and with intravenous contrast.
CONTRAST:  5.5mL GADAVIST GADOBUTROL 1 MMOL/ML IV SOLN

[Series 3: T1 · sagittal · 3.0mm · 0.43mm/px · 3 of 15 slices shown (1 of 2)]
[im 1/15]
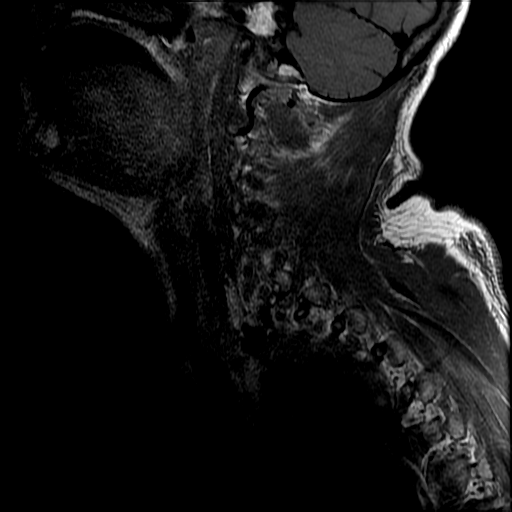
[im 8/15]
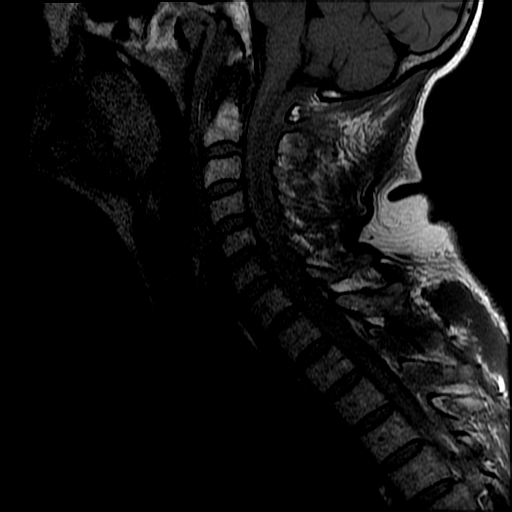
[im 15/15]
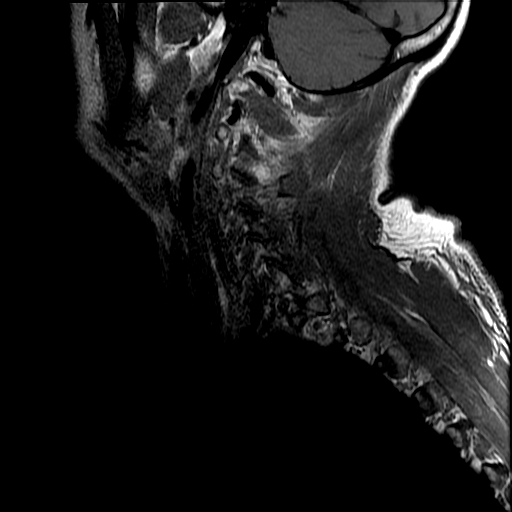

[Series 6: T2 · axial · 3.0mm · 0.39mm/px · z∈[-85,+34]mm · 7 of 36 slices shown]
[im 1/36]
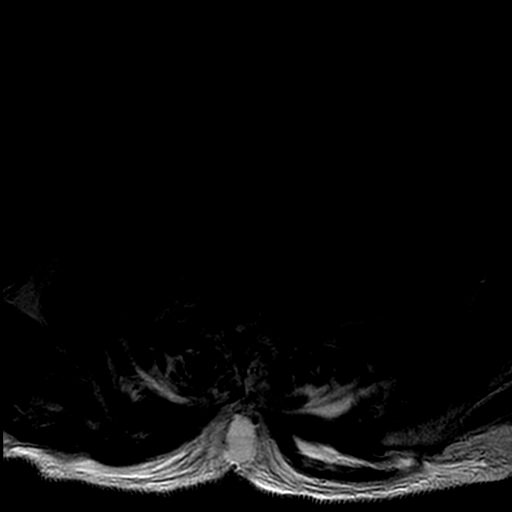
[im 6/36]
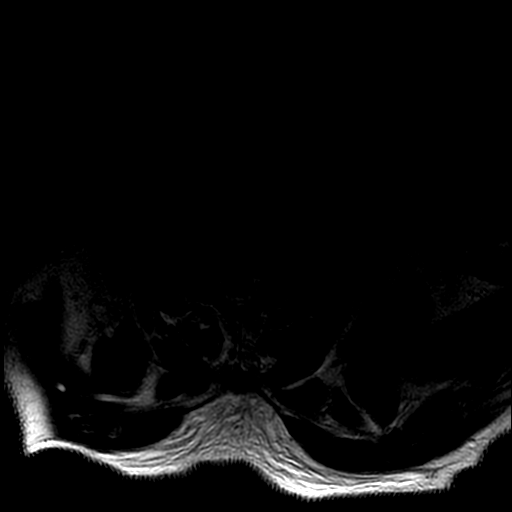
[im 12/36]
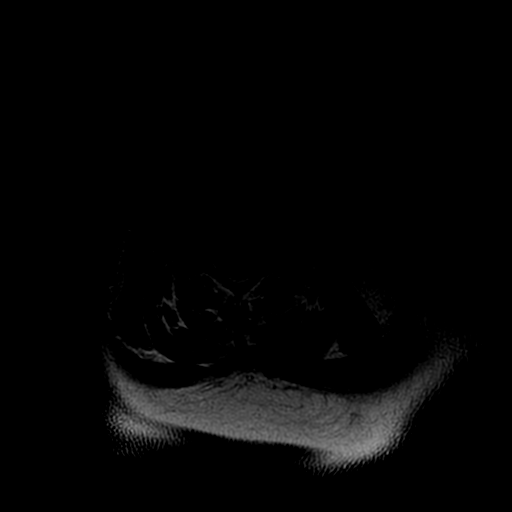
[im 18/36]
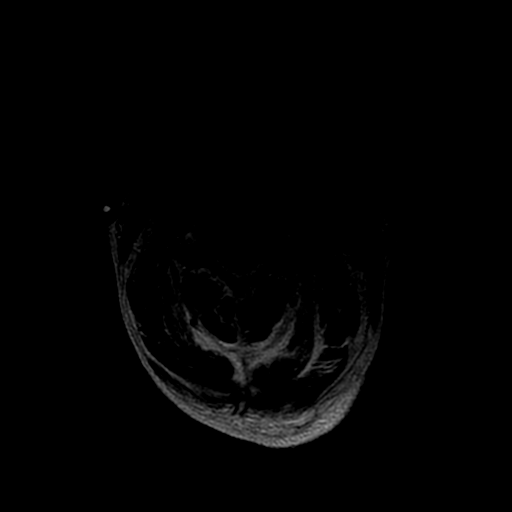
[im 24/36]
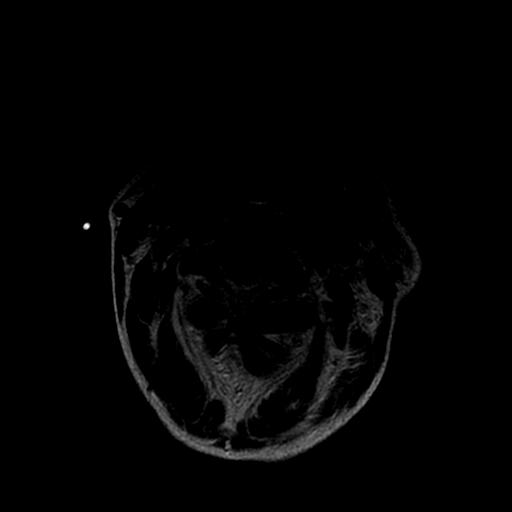
[im 30/36]
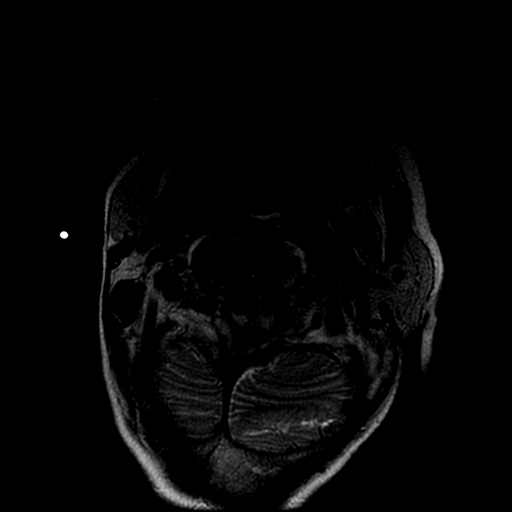
[im 36/36]
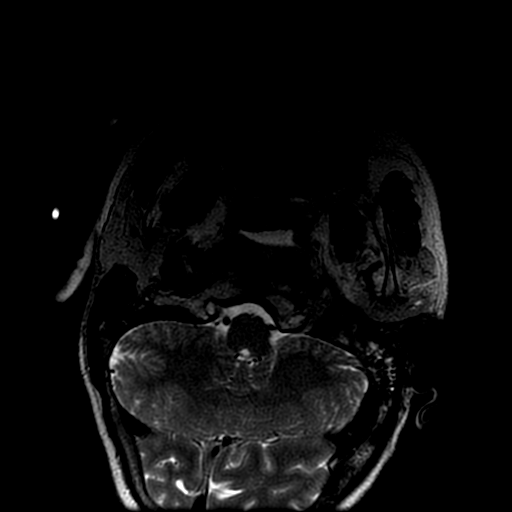

[Series 7: T1 · axial · non-contrast · 3.0mm · 0.39mm/px · z∈[-85,+14]mm · 5 of 36 slices shown (2 of 2)]
[im 1/36]
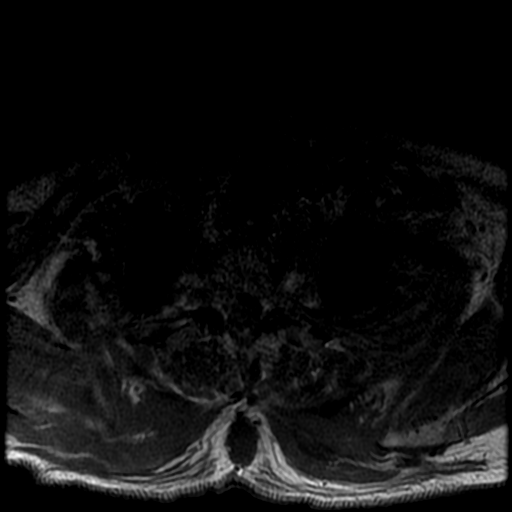
[im 6/36]
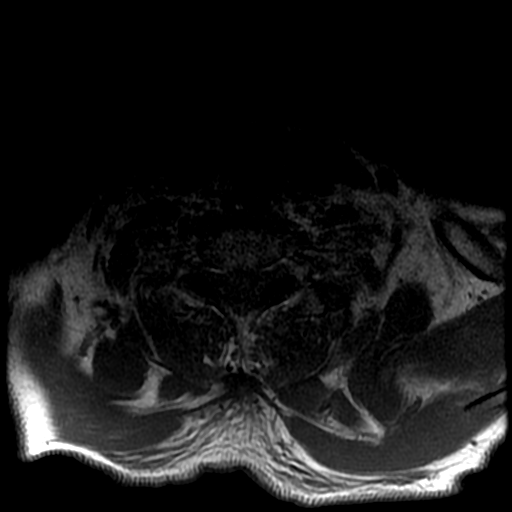
[im 12/36]
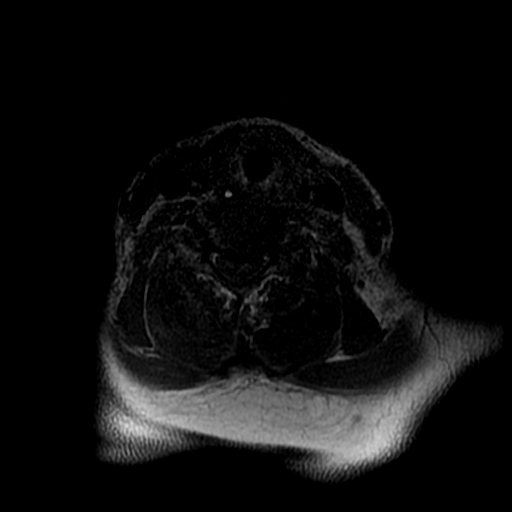
[im 18/36]
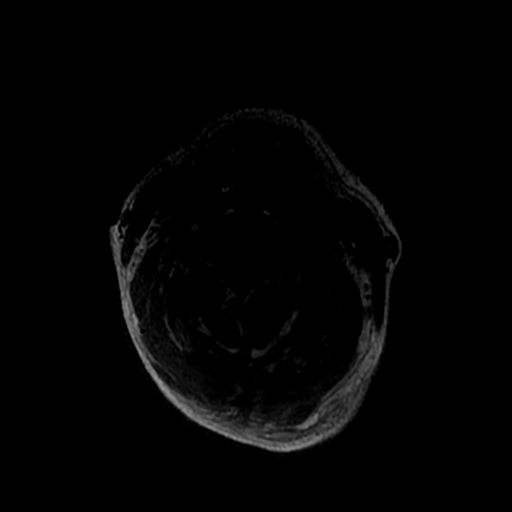
[im 30/36]
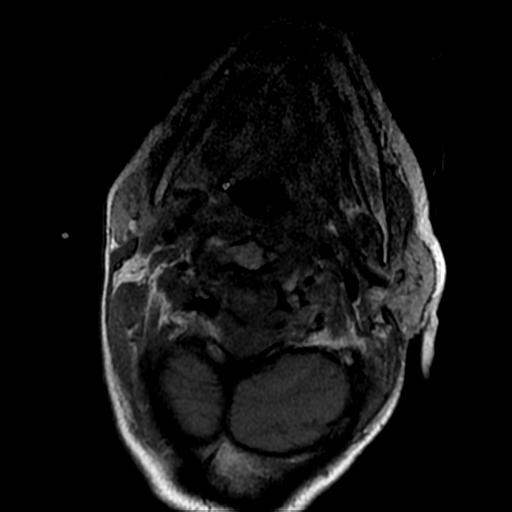

[Series 8: T2 post-contrast · sagittal · 3.0mm · 0.39mm/px · 3 of 15 slices shown]
[im 1/15]
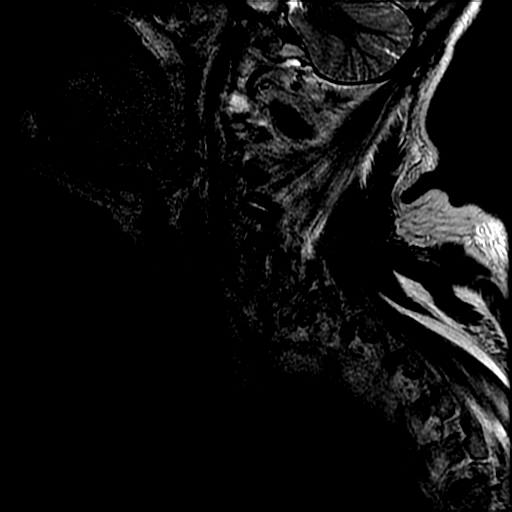
[im 8/15]
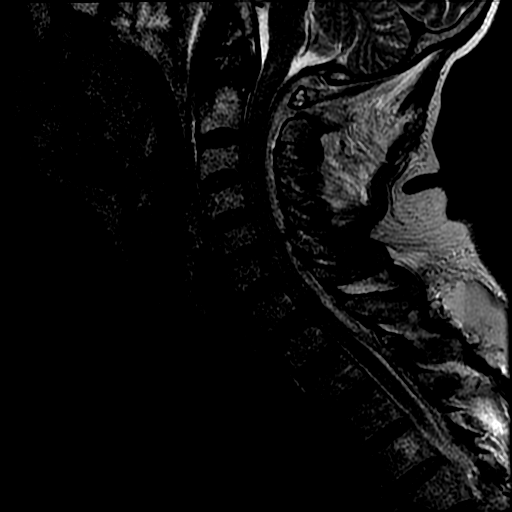
[im 15/15]
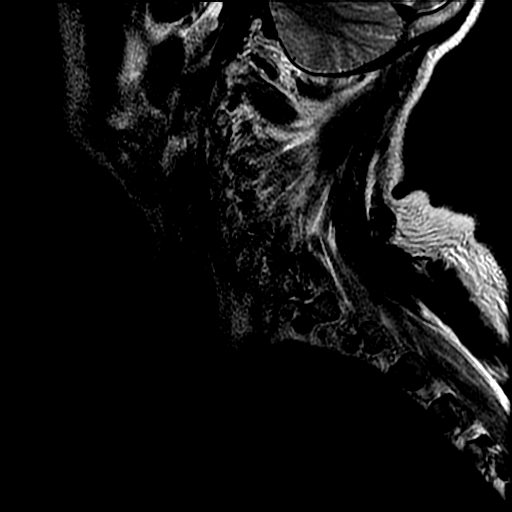

[18 of 48 positions shown; findings below may reference images not displayed]

FINDINGS: Alignment: Trace retrolisthesis of C5 on C6.

Vertebrae: No fracture, suspicious marrow lesion, or evidence of
discitis. Partially visualized sequelae of recent left-sided
laminotomies in the included upper thoracic spine.

Cord: No convincing cord signal abnormality in the cervical spine.
Partially visualized T2 hyperintensity in the thoracic spinal cord
at T4-5 likely reflecting edema and not clearly present on the prior
thoracic spine MRI.

Posterior Fossa, vertebral arteries, paraspinal tissues:
Postoperative changes in the posterior soft tissues of the included
upper thoracic spine including a fluid collection extending to the
T1 level with a drain in place.

Disc levels:

A peripherally enhancing dorsal epidural fluid collection extends
from the foramen magnum to the T2 level and measures approximately 5
mm in AP thickness throughout most of this extent. There is
associated spinal stenosis with ventral displacement of the spinal
cord and effacement of CSF throughout the cervical spine. Spinal
stenosis is most severe at C5-6 due to a combination of the epidural
fluid collection and disc bulging with narrowing of the thecal sac
to 5 mm AP diameter and moderate cord flattening. There is residual
dorsal epidural enhancement below the T2 level in the included
portion of the thoracic spine without a sizable residual epidural
fluid collection visible at these levels on sagittal images.
IMPRESSION: 1. Dorsal epidural abscess extending from the foramen magnum to T2
with associated spinal stenosis, most severe at C5-6.
2. Partially visualized edema in the upper thoracic spinal cord
which extends more proximally than on the prior thoracic MRI.

## 2021-07-10 SURGERY — POSTERIOR CERVICAL LAMINECTOMY
Anesthesia: General | Site: Spine Cervical

## 2021-07-10 MED ORDER — VANCOMYCIN HCL 1000 MG IV SOLR
INTRAVENOUS | Status: AC
  Filled 2021-07-10: qty 20

## 2021-07-10 MED ORDER — ONDANSETRON HCL 4 MG/2ML IJ SOLN: 4.0000 mg | Freq: Four times a day (QID) | INTRAMUSCULAR | Status: DC | PRN

## 2021-07-10 MED ORDER — LACTATED RINGERS IV SOLN: INTRAVENOUS | Status: DC | PRN

## 2021-07-10 MED ORDER — DOCUSATE SODIUM 100 MG PO CAPS: 100.0000 mg | ORAL_CAPSULE | Freq: Two times a day (BID) | ORAL | Status: DC

## 2021-07-10 MED ORDER — HYDRALAZINE HCL 20 MG/ML IJ SOLN
10.0000 mg | INTRAMUSCULAR | Status: DC | PRN
  Administered 2021-07-11 – 2021-07-14 (×4): 10 mg via INTRAVENOUS
  Filled 2021-07-10 (×4): qty 1

## 2021-07-10 MED ORDER — BISACODYL 10 MG RE SUPP: 10.0000 mg | Freq: Every day | RECTAL | Status: DC | PRN

## 2021-07-10 MED ORDER — MENTHOL 3 MG MT LOZG: 1.0000 | LOZENGE | OROMUCOSAL | Status: DC | PRN

## 2021-07-10 MED ORDER — BACITRACIN ZINC 500 UNIT/GM EX OINT
TOPICAL_OINTMENT | CUTANEOUS | Status: DC | PRN
  Administered 2021-07-10: 1 via TOPICAL

## 2021-07-10 MED ORDER — BUPIVACAINE-EPINEPHRINE 0.5% -1:200000 IJ SOLN
INTRAMUSCULAR | Status: DC | PRN
  Administered 2021-07-10: 10 mL

## 2021-07-10 MED ORDER — ACETAMINOPHEN 325 MG PO TABS
650.0000 mg | ORAL_TABLET | ORAL | Status: DC | PRN
  Administered 2021-07-11 – 2021-07-13 (×3): 650 mg
  Filled 2021-07-10 (×3): qty 2

## 2021-07-10 MED ORDER — CLONAZEPAM 0.25 MG PO TBDP
0.5000 mg | ORAL_TABLET | Freq: Two times a day (BID) | ORAL | Status: DC
  Administered 2021-07-10 – 2021-07-13 (×8): 0.5 mg
  Filled 2021-07-10 (×9): qty 2

## 2021-07-10 MED ORDER — OLANZAPINE 10 MG PO TABS
10.0000 mg | ORAL_TABLET | Freq: Every day | ORAL | Status: DC
  Administered 2021-07-10 – 2021-07-13 (×4): 10 mg
  Filled 2021-07-10 (×5): qty 1

## 2021-07-10 MED ORDER — ALUM & MAG HYDROXIDE-SIMETH 200-200-20 MG/5ML PO SUSP: 30.0000 mL | Freq: Four times a day (QID) | ORAL | Status: DC | PRN

## 2021-07-10 MED ORDER — FLUOXETINE HCL 20 MG/5ML PO SOLN
10.0000 mg | Freq: Every day | ORAL | Status: DC
  Administered 2021-07-10 – 2021-07-13 (×4): 10 mg
  Filled 2021-07-10 (×5): qty 5

## 2021-07-10 MED ORDER — BACITRACIN ZINC 500 UNIT/GM EX OINT
TOPICAL_OINTMENT | CUTANEOUS | Status: AC
  Filled 2021-07-10: qty 28.35

## 2021-07-10 MED ORDER — VANCOMYCIN HCL 1000 MG IV SOLR
INTRAVENOUS | Status: DC | PRN
  Administered 2021-07-10: 1000 mg

## 2021-07-10 MED ORDER — SUCCINYLCHOLINE CHLORIDE 200 MG/10ML IV SOSY
PREFILLED_SYRINGE | INTRAVENOUS | Status: AC
  Filled 2021-07-10: qty 10

## 2021-07-10 MED ORDER — CEFAZOLIN SODIUM-DEXTROSE 2-4 GM/100ML-% IV SOLN
2.0000 g | Freq: Three times a day (TID) | INTRAVENOUS | Status: AC
  Administered 2021-07-10 – 2021-07-11 (×2): 2 g via INTRAVENOUS
  Filled 2021-07-10 (×2): qty 100

## 2021-07-10 MED ORDER — SODIUM CHLORIDE 0.9 % IV SOLN: 10.0000 mL/h | Freq: Once | INTRAVENOUS | Status: DC

## 2021-07-10 MED ORDER — THROMBIN 5000 UNITS EX SOLR
CUTANEOUS | Status: AC
  Filled 2021-07-10: qty 5000

## 2021-07-10 MED ORDER — METHADONE HCL 10 MG PO TABS
90.0000 mg | ORAL_TABLET | Freq: Every day | ORAL | Status: DC
  Administered 2021-07-10 – 2021-07-13 (×4): 90 mg
  Filled 2021-07-10 (×4): qty 9

## 2021-07-10 MED ORDER — BUPIVACAINE-EPINEPHRINE 0.5% -1:200000 IJ SOLN
INTRAMUSCULAR | Status: AC
  Filled 2021-07-10: qty 1

## 2021-07-10 MED ORDER — THROMBIN 5000 UNITS EX SOLR
OROMUCOSAL | Status: DC | PRN
  Administered 2021-07-10: 5 mL via TOPICAL

## 2021-07-10 MED ORDER — BETHANECHOL CHLORIDE 10 MG PO TABS
10.0000 mg | ORAL_TABLET | Freq: Three times a day (TID) | ORAL | Status: AC
  Administered 2021-07-10 – 2021-07-12 (×8): 10 mg
  Filled 2021-07-10 (×8): qty 1

## 2021-07-10 MED ORDER — PROPOFOL 10 MG/ML IV BOLUS
INTRAVENOUS | Status: DC | PRN
  Administered 2021-07-10: 60 mg via INTRAVENOUS

## 2021-07-10 MED ORDER — PHENOL 1.4 % MT LIQD: 1.0000 | OROMUCOSAL | Status: DC | PRN

## 2021-07-10 MED ORDER — MORPHINE SULFATE (PF) 4 MG/ML IV SOLN
4.0000 mg | INTRAVENOUS | Status: DC | PRN
  Administered 2021-07-11: 4 mg via INTRAVENOUS
  Filled 2021-07-10: qty 1

## 2021-07-10 MED ORDER — PHENYLEPHRINE 40 MCG/ML (10ML) SYRINGE FOR IV PUSH (FOR BLOOD PRESSURE SUPPORT)
PREFILLED_SYRINGE | INTRAVENOUS | Status: AC
  Filled 2021-07-10: qty 10

## 2021-07-10 MED ORDER — GADOBUTROL 1 MMOL/ML IV SOLN
5.5000 mL | Freq: Once | INTRAVENOUS | Status: AC | PRN
  Administered 2021-07-10: 5.5 mL via INTRAVENOUS

## 2021-07-10 MED ORDER — LACTATED RINGERS IV SOLN: INTRAVENOUS | Status: DC

## 2021-07-10 MED ORDER — ONDANSETRON HCL 4 MG PO TABS: 4.0000 mg | ORAL_TABLET | Freq: Four times a day (QID) | ORAL | Status: DC | PRN

## 2021-07-10 MED ORDER — ROCURONIUM BROMIDE 10 MG/ML (PF) SYRINGE
PREFILLED_SYRINGE | INTRAVENOUS | Status: AC
  Filled 2021-07-10: qty 10

## 2021-07-10 MED ORDER — PANTOPRAZOLE SODIUM 40 MG IV SOLR
40.0000 mg | Freq: Every day | INTRAVENOUS | Status: DC
  Administered 2021-07-10 – 2021-07-11 (×2): 40 mg via INTRAVENOUS
  Filled 2021-07-10 (×2): qty 10

## 2021-07-10 MED ORDER — EPHEDRINE 5 MG/ML INJ
INTRAVENOUS | Status: AC
  Filled 2021-07-10: qty 5

## 2021-07-10 MED ORDER — ACETAMINOPHEN 500 MG PO TABS
1000.0000 mg | ORAL_TABLET | Freq: Four times a day (QID) | ORAL | Status: AC
  Administered 2021-07-10 – 2021-07-11 (×4): 1000 mg
  Filled 2021-07-10 (×4): qty 2

## 2021-07-10 MED ORDER — 0.9 % SODIUM CHLORIDE (POUR BTL) OPTIME
TOPICAL | Status: DC | PRN
  Administered 2021-07-10: 1000 mL

## 2021-07-10 MED ORDER — ACETAMINOPHEN 650 MG RE SUPP
650.0000 mg | RECTAL | Status: DC | PRN
Start: 2021-07-10 — End: 2021-07-17
  Administered 2021-07-16: 650 mg via RECTAL
  Filled 2021-07-10: qty 1

## 2021-07-10 MED ORDER — ROCURONIUM BROMIDE 10 MG/ML (PF) SYRINGE
PREFILLED_SYRINGE | INTRAVENOUS | Status: DC | PRN
  Administered 2021-07-10 (×3): 50 mg via INTRAVENOUS

## 2021-07-10 SURGICAL SUPPLY — 59 items
BAG COUNTER SPONGE SURGICOUNT (BAG) ×2 IMPLANT
BAND RUBBER #18 3X1/16 STRL (MISCELLANEOUS) IMPLANT
BENZOIN TINCTURE PRP APPL 2/3 (GAUZE/BANDAGES/DRESSINGS) ×2 IMPLANT
BIT DRILL NEURO 2X3.1 SFT TUCH (MISCELLANEOUS) ×1 IMPLANT
BLADE CLIPPER SURG (BLADE) ×1 IMPLANT
BLADE ULTRA TIP 2M (BLADE) IMPLANT
BUR MATCHSTICK NEURO 3.0 LAGG (BURR) IMPLANT
BUR PRECISION FLUTE 5.0 (BURR) IMPLANT
BUR PRECISION FLUTE 6.0 (BURR) ×1 IMPLANT
CANISTER SUCT 3000ML PPV (MISCELLANEOUS) ×2 IMPLANT
CARTRIDGE OIL MAESTRO DRILL (MISCELLANEOUS) ×1 IMPLANT
CLSR STERI-STRIP ANTIMIC 1/2X4 (GAUZE/BANDAGES/DRESSINGS) ×1 IMPLANT
DIFFUSER DRILL AIR PNEUMATIC (MISCELLANEOUS) ×2 IMPLANT
DRAPE C-ARM 42X72 X-RAY (DRAPES) IMPLANT
DRAPE LAPAROTOMY 100X72 PEDS (DRAPES) ×2 IMPLANT
DRAPE MICROSCOPE LEICA (MISCELLANEOUS) IMPLANT
DRAPE SURG 17X23 STRL (DRAPES) ×4 IMPLANT
DRILL NEURO 2X3.1 SOFT TOUCH (MISCELLANEOUS) ×2
DRSG OPSITE POSTOP 4X6 (GAUZE/BANDAGES/DRESSINGS) ×1 IMPLANT
ELECT REM PT RETURN 9FT ADLT (ELECTROSURGICAL) ×2
ELECTRODE REM PT RTRN 9FT ADLT (ELECTROSURGICAL) ×1 IMPLANT
EVACUATOR 1/8 PVC DRAIN (DRAIN) ×1 IMPLANT
GAUZE 4X4 16PLY ~~LOC~~+RFID DBL (SPONGE) IMPLANT
GAUZE SPONGE 4X4 12PLY STRL (GAUZE/BANDAGES/DRESSINGS) ×1 IMPLANT
GLOVE EXAM NITRILE XL STR (GLOVE) IMPLANT
GLOVE SURG ENC MOIS LTX SZ8 (GLOVE) ×3 IMPLANT
GLOVE SURG ENC MOIS LTX SZ8.5 (GLOVE) ×3 IMPLANT
GLOVE SURG UNDER POLY LF SZ7 (GLOVE) ×2 IMPLANT
GOWN STRL REUS W/ TWL LRG LVL3 (GOWN DISPOSABLE) IMPLANT
GOWN STRL REUS W/ TWL XL LVL3 (GOWN DISPOSABLE) IMPLANT
GOWN STRL REUS W/TWL LRG LVL3 (GOWN DISPOSABLE) ×4
GOWN STRL REUS W/TWL XL LVL3 (GOWN DISPOSABLE) ×4
HEMOSTAT POWDER KIT SURGIFOAM (HEMOSTASIS) ×1 IMPLANT
KIT BASIN OR (CUSTOM PROCEDURE TRAY) ×2 IMPLANT
KIT TURNOVER KIT B (KITS) ×2 IMPLANT
NDL SPNL 18GX3.5 QUINCKE PK (NEEDLE) ×1 IMPLANT
NEEDLE HYPO 22GX1.5 SAFETY (NEEDLE) ×2 IMPLANT
NEEDLE SPNL 18GX3.5 QUINCKE PK (NEEDLE) ×2 IMPLANT
NS IRRIG 1000ML POUR BTL (IV SOLUTION) ×2 IMPLANT
OIL CARTRIDGE MAESTRO DRILL (MISCELLANEOUS) ×2
PACK LAMINECTOMY NEURO (CUSTOM PROCEDURE TRAY) ×2 IMPLANT
PAD ARMBOARD 7.5X6 YLW CONV (MISCELLANEOUS) ×6 IMPLANT
PATTIES SURGICAL .25X.25 (GAUZE/BANDAGES/DRESSINGS) IMPLANT
SPONGE NEURO XRAY DETECT 1X3 (DISPOSABLE) IMPLANT
SPONGE SURGIFOAM ABS GEL SZ50 (HEMOSTASIS) ×1 IMPLANT
SPONGE T-LAP 4X18 ~~LOC~~+RFID (SPONGE) IMPLANT
STAPLER SKIN PROX WIDE 3.9 (STAPLE) IMPLANT
STRIP CLOSURE SKIN 1/2X4 (GAUZE/BANDAGES/DRESSINGS) ×2 IMPLANT
SUT ETHILON 2 0 FS 18 (SUTURE) IMPLANT
SUT VIC AB 0 CT1 18XCR BRD8 (SUTURE) ×1 IMPLANT
SUT VIC AB 0 CT1 8-18 (SUTURE) ×2
SUT VIC AB 2-0 CP2 18 (SUTURE) ×2 IMPLANT
SWAB COLLECTION DEVICE MRSA (MISCELLANEOUS) ×1 IMPLANT
SWAB CULTURE ESWAB REG 1ML (MISCELLANEOUS) ×1 IMPLANT
TOWEL GREEN STERILE (TOWEL DISPOSABLE) ×2 IMPLANT
TOWEL GREEN STERILE FF (TOWEL DISPOSABLE) ×2 IMPLANT
TRAY FOLEY MTR SLVR 16FR STAT (SET/KITS/TRAYS/PACK) IMPLANT
UNDERPAD 30X36 HEAVY ABSORB (UNDERPADS AND DIAPERS) IMPLANT
WATER STERILE IRR 1000ML POUR (IV SOLUTION) ×2 IMPLANT

## 2021-07-10 NOTE — Progress Notes (Signed)
Subjective: ?The patient is intubated and agitated.  According to her nurse she has not moved her right upper extremity or bilateral lower extremities. ? ?Objective: ?Vital signs in last 24 hours: ?Temp:  [97.4 ?F (36.3 ?C)-101.4 ?F (38.6 ?C)] 98.9 ?F (37.2 ?C) (04/10 0800) ?Pulse Rate:  [59-87] 70 (04/10 0700) ?Resp:  [15-33] 23 (04/10 0700) ?BP: (104-158)/(59-83) 141/66 (04/10 0700) ?SpO2:  [90 %-100 %] 96 % (04/10 0744) ?Arterial Line BP: (118-187)/(58-89) 171/78 (04/10 0700) ?FiO2 (%):  [40 %] 40 % (04/10 0744) ?Weight:  [57.2 kg] 57.2 kg (04/10 0341) ?Estimated body mass index is 22.34 kg/m? as calculated from the following: ?  Height as of this encounter: '5\' 3"'$  (1.6 m). ?  Weight as of this encounter: 57.2 kg. ? ? ?Intake/Output from previous day: ?04/09 0701 - 04/10 0700 ?In: 1720.2 [I.V.:255.3; NG/GT:960; IV Piggyback:504.9] ?Out: 7062 [Urine:975; Drains:40] ?Intake/Output this shift: ?No intake/output data recorded. ? ?Physical exam the patient is agitated.  She follows commands in her left upper extremity.  She does not move her right upper extremity or bilateral lower extremities. ? ?Lab Results: ?Recent Labs  ?  07/09/21 ?0441 07/02/2021 ?0340  ?WBC 15.4* 12.8*  ?HGB 10.2* 9.8*  ?HCT 32.5* 31.0*  ?PLT 260 239  ? ?BMET ?Recent Labs  ?  07/09/21 ?0441 07/27/2021 ?0340  ?NA 136 140  ?K 3.3* 4.3  ?CL 104 108  ?CO2 26 27  ?GLUCOSE 239* 169*  ?BUN 24* 16  ?CREATININE 0.63 0.55  ?CALCIUM 8.4* 8.5*  ? ? ?Studies/Results: ?DG Abd 1 View ? ?Result Date: 07/05/2021 ?CLINICAL DATA:  NG tube placement EXAM: ABDOMEN - 1 VIEW COMPARISON:  Earlier same day FINDINGS: NG tube is positioned with the tip in the distal stomach at the pylorus. Gaseous bowel distension noted over the visualized upper abdomen. IMPRESSION: NG tube tip is in the distal stomach near the pylorus. Electronically Signed   By: Misty Stanley M.D.   On: 07/03/2021 15:12  ? ?ECHOCARDIOGRAM COMPLETE ? ?Result Date: 07/24/2021 ?   ECHOCARDIOGRAM REPORT   Patient  Name:   Linda Cervantes Date of Exam: 07/05/2021 Medical Rec #:  [id number]       Height:       63.0 in Accession #:    [id number]      Weight:       113.5 lb Date of Birth:  1966/02/03       BSA:          1.520 m? Patient Age:    56 years        BP:           106/63 mmHg Patient Gender: F               HR:           118 bpm. Exam Location:  Inpatient Procedure: 2D Echo, Cardiac Doppler and Color Doppler Indications:    Epidural abscess. Septic embolization. Bacteremia  History:        Patient has no prior history of Echocardiogram examinations.                 Abnormal ECG, COPD, Arrythmias:Tachycardia,                 Signs/Symptoms:Bacteremia and Dyspnea; Risk Factors:Current                 Smoker. IVDU. Polysubstance abuse.  Sonographer:    Roseanna Rainbow RDCS Referring Phys: [place]  Sonographer Comments: Echo  performed with patient supine and on artificial respirator, suboptimal parasternal window and Technically difficult study due to poor echo windows. IMPRESSIONS  1. Left ventricular ejection fraction, by estimation, is 60 to 65%. The left ventricle has normal function. The left ventricle has no regional wall motion abnormalities. There is mild left ventricular hypertrophy. Left ventricular diastolic parameters were normal.  2. Right ventricular systolic function is normal. The right ventricular size is normal.  3. The mitral valve is normal in structure. No evidence of mitral valve regurgitation. No evidence of mitral stenosis.  4. The aortic valve is normal in structure. Aortic valve regurgitation is not visualized. No aortic stenosis is present.  5. The inferior vena cava is normal in size with greater than 50% respiratory variability, suggesting right atrial pressure of 3 mmHg. FINDINGS  Left Ventricle: Left ventricular ejection fraction, by estimation, is 60 to 65%. The left ventricle has normal function. The left ventricle has no regional wall motion abnormalities. The left ventricular internal  cavity size was normal in size. There is  mild left ventricular hypertrophy. Left ventricular diastolic parameters were normal. Right Ventricle: The right ventricular size is normal. No increase in right ventricular wall thickness. Right ventricular systolic function is normal. Left Atrium: Left atrial size was normal in size. Right Atrium: Right atrial size was normal in size. Pericardium: There is no evidence of pericardial effusion. Mitral Valve: The mitral valve is normal in structure. No evidence of mitral valve regurgitation. No evidence of mitral valve stenosis. Tricuspid Valve: The tricuspid valve is normal in structure. Tricuspid valve regurgitation is trivial. No evidence of tricuspid stenosis. Aortic Valve: The aortic valve is normal in structure. Aortic valve regurgitation is not visualized. No aortic stenosis is present. Pulmonic Valve: The pulmonic valve was not well visualized. Pulmonic valve regurgitation is not visualized. No evidence of pulmonic stenosis. Aorta: The aortic root is normal in size and structure. Venous: The inferior vena cava is normal in size with greater than 50% respiratory variability, suggesting right atrial pressure of 3 mmHg. IAS/Shunts: No atrial level shunt detected by color flow Doppler.  LEFT VENTRICLE PLAX 2D LVIDd:         3.40 cm     Diastology LVIDs:         2.05 cm     LV e' medial:    10.20 cm/s LV PW:         0.80 cm     LV E/e' medial:  7.0 LV IVS:        1.20 cm     LV e' lateral:   8.58 cm/s LVOT diam:     1.90 cm     LV E/e' lateral: 8.3 LV SV:         54 LV SV Index:   36 LVOT Area:     2.84 cm?  LV Volumes (MOD) LV vol d, MOD A2C: 34.6 ml LV vol d, MOD A4C: 49.8 ml LV vol s, MOD A2C: 9.1 ml LV vol s, MOD A4C: 12.9 ml LV SV MOD A2C:     25.5 ml LV SV MOD A4C:     49.8 ml LV SV MOD BP:      30.9 ml RIGHT VENTRICLE             IVC RV S prime:     27.60 cm/s  IVC diam: 2.60 cm TAPSE (M-mode): 1.3 cm LEFT ATRIUM             Index  RIGHT ATRIUM          Index  LA diam:        3.20 cm 2.11 cm/m?  RA Area:     4.24 cm? LA Vol (A2C):   10.2 ml 6.71 ml/m?  RA Volume:   5.42 ml  3.57 ml/m? LA Vol (A4C):   13.0 ml 8.55 ml/m? LA Biplane Vol: 11.6 ml 7.63 ml/m?  AORTIC VALVE LVOT Vmax:   168.00 cm/s LVOT Vmean:  92.200 cm/s LVOT VTI:    0.191 m  AORTA Ao Root diam: 2.60 cm Ao Asc diam:  2.50 cm MITRAL VALVE MV Area (PHT): 5.50 cm?    SHUNTS MV Decel Time: 138 msec    Systemic VTI:  0.19 m MV E velocity: 71.13 cm/s  Systemic Diam: 1.90 cm MV A velocity: 89.97 cm/s MV E/A ratio:  0.79 Bird Swetz Rouge MD Electronically signed by Prashant Glosser Rouge MD Signature Date/Time: 07/29/2021/2:46:11 PM    Final    ? ?Assessment/Plan: ?Holospinal epidural abscess: The patient is gravely ill.  I will order a cervical MRI given her new upper extremity symptoms. ? LOS: 3 days  ? ? ? ?Ophelia Charter ?07/03/2021, 8:15 AM ? ? ? ? ? ?

## 2021-07-10 NOTE — Progress Notes (Signed)
eLink Physician-Brief Progress Note ?Patient Name: Linda Cervantes ?DOB: 10/19/1965 ?MRN: [id number] ? ? ?Date of Service ? 07/20/2021  ?HPI/Events of Note ? Hypertension - SBP = 175. Goal SBP < 170.  ?eICU Interventions ? Plan: ?Hydralazine 10 mg IV Q 4 hours PRN SBP > 170.   ? ? ? ?Intervention Category ?Major Interventions: Hypertension - evaluation and management ? ?Milissa Fesperman Cornelia Copa ?07/29/2021, 11:27 PM ?

## 2021-07-10 NOTE — Op Note (Signed)
Brief history: The patient is a 56 year old white female with a history of intravenous drug abuse who presented with paraplegia.  She was found to have a thoracic abscess.  I performed a thoracic laminectomy for drainage of the epidural abscess 3 days ago.  Patient remained gravely ill and intubated.  This morning when I saw her she was not moving her right arm nor either of her lower extremities..  We worked up with a cervical MRI which demonstrated an extensive cervical epidural abscess all the way up to the foramen magnum.  The patient was delirious and not able to consent for herself.  I spoke with her son and recommended a cervical laminectomy as a long shot to prevent further weakness/quadriplegia.  The patient's son consented on her behalf. ? ?Preop diagnosis: Cervical epidural abscess, triplegia ? ?Postop diagnosis: The same ? ?Procedure: Left C1-T1 hemilaminectomy for drainage of cervical epidural abscess ? ?Surgeon: Dr. Earle Gell ? ?Assistant: None ? ?Anesthesia: General tracheal ? ?Estimated blood loss: 100 cc ? ?Specimens: Cultures ? ?Drains: 1 medium Hemovac in the epidural space ? ?Complications: None ? ?Description of procedure: The patient was brought to the operating room by the anesthesia team.  General endotracheal anesthesia was induced.  I applied the [place] three-point headrest to her calvarium.  We then carefully turned her to the prone position on the chest rolls.  The patient's cervical thoracic region was then prepared with Betadine scrub and Betadine solution.  Sterile drapes were applied.  I injected the area to be incised with Marcaine with epinephrine solution.  I then used a scalpel to make a midline incision from approximately C1-T1.  I then used electrocautery to perform a left sided subperiosteal section exposing the spinous process lamina from C1-T1.  We obtained intraoperative radiograph to confirm our location.  I inserted the McCullough retractor for exposure.  I then used  a high-speed drill to perform a left T1 laminotomy.  I remove the ligamentum flavum and encountered pus.  We obtain cultures.  I then carefully dissected in the epidural space decompressing the thecal sac. ? ?I then repeated this in an analogous fashion at C7, C6, C5, C4, C3, C2 and C1.  I remove the ligamentum flavum at the occiput-C1, C1-2, C2-3, C3-4, C4-5, C5-6, C6-7 and C7-T1.  Again we encountered some epidural pus and a lot of granulated tissue.  We decompressed the thecal sac.  I completed the hemilaminectomy at C1, C2, C3, C4, C5 and C6. ? ?We then obtained hemostasis using bipolar cautery.  We irrigated the wound out with saline solution.  We placed vancomycin powder in the wound.  I placed a medium Hemovac drain in the epidural space and tunneled it out through a separate stab wound.  I then remove the retractor and reapproximated patient's cervical thoracic fascia with interrupted #1 Vicryl suture.  I reapproximated the subcutaneous tissue with interrupted 2-0 Vicryl suture.  I reapproximated the skin with Steri-Strips and benzoin.  The wound was then coated with bacitracin ointment.  A sterile dressing was applied.  The drapes were removed.  I then remove the [place] three-point headrest from the patient calvarium.  She was then returned to supine position.  By report all sponge, instrument, and needle counts were correct at the end of this case. ? ? ?

## 2021-07-10 NOTE — Progress Notes (Signed)
PHARMACY - PHYSICIAN COMMUNICATION ?CRITICAL VALUE ALERT - BLOOD CULTURE  ? ?Linda Cervantes is an 56 y.o. female who presented to [facility] on 07/05/2021 with a chief complaint of low back pain ? ?Assessment:  [name] receiving treatment for MRSA epidural abscess - being seen by the ID service. Blood cultures from 4/7 with 4/4 bottles growing MRSA, repeat cultures from 4/9 showing 1 of 3 bottles growing GPC - micro will not do BCID as presumably same MRSA though possible it could also just be a staph species or staph epi contamination. Another set of repeat blood cultures ordered.  ? ?Name of physician (or Provider) Contacted: Candiss Norse (ID) ? ?Current antibiotics: Vancomycin ? ?Changes to prescribed antibiotics recommended:  ?None - will monitor, repeat blood cultures ? ?Results for orders placed or performed during the hospital encounter of 07/13/2021  ?Blood Culture ID Panel (Reflexed) (Collected: 07/24/2021  2:51 PM)  ?Result Value Ref Range  ? Enterococcus faecalis NOT DETECTED NOT DETECTED  ? Enterococcus Faecium NOT DETECTED NOT DETECTED  ? Listeria monocytogenes NOT DETECTED NOT DETECTED  ? Staphylococcus species DETECTED (A) NOT DETECTED  ? Staphylococcus aureus (BCID) DETECTED (A) NOT DETECTED  ? Staphylococcus epidermidis NOT DETECTED NOT DETECTED  ? Staphylococcus lugdunensis NOT DETECTED NOT DETECTED  ? Streptococcus species NOT DETECTED NOT DETECTED  ? Streptococcus agalactiae NOT DETECTED NOT DETECTED  ? Streptococcus pneumoniae NOT DETECTED NOT DETECTED  ? Streptococcus pyogenes NOT DETECTED NOT DETECTED  ? A.calcoaceticus-baumannii NOT DETECTED NOT DETECTED  ? Bacteroides fragilis NOT DETECTED NOT DETECTED  ? Enterobacterales NOT DETECTED NOT DETECTED  ? Enterobacter cloacae complex NOT DETECTED NOT DETECTED  ? Escherichia coli NOT DETECTED NOT DETECTED  ? Klebsiella aerogenes NOT DETECTED NOT DETECTED  ? Klebsiella oxytoca NOT DETECTED NOT DETECTED  ? Klebsiella pneumoniae NOT DETECTED NOT DETECTED  ?  Proteus species NOT DETECTED NOT DETECTED  ? Salmonella species NOT DETECTED NOT DETECTED  ? Serratia marcescens NOT DETECTED NOT DETECTED  ? Haemophilus influenzae NOT DETECTED NOT DETECTED  ? Neisseria meningitidis NOT DETECTED NOT DETECTED  ? Pseudomonas aeruginosa NOT DETECTED NOT DETECTED  ? Stenotrophomonas maltophilia NOT DETECTED NOT DETECTED  ? Candida albicans NOT DETECTED NOT DETECTED  ? Candida auris NOT DETECTED NOT DETECTED  ? Candida glabrata NOT DETECTED NOT DETECTED  ? Candida krusei NOT DETECTED NOT DETECTED  ? Candida parapsilosis NOT DETECTED NOT DETECTED  ? Candida tropicalis NOT DETECTED NOT DETECTED  ? Cryptococcus neoformans/gattii NOT DETECTED NOT DETECTED  ? Meth resistant mecA/C and MREJ DETECTED (A) NOT DETECTED  ? ? ?Thank you for allowing pharmacy to be a part of this patient?s care. ? ?Alycia Rossetti, PharmD, BCPS ?Infectious Diseases Clinical Pharmacist ?07/30/2021 11:44 AM  ? ?**Pharmacist phone directory can now be found on [email or web address] (PW [email or web address]).  Listed under [place].  ? ?

## 2021-07-10 NOTE — Progress Notes (Signed)
? ?NAME:  Linda Cervantes, MRN:  [id number], DOB:  08-Jan-1966, LOS: 3 ?ADMISSION DATE:  07/16/2021, CONSULTATION DATE:  07/06/2021 ?REFERRING MD:  Dr. Lonny Prude, CHIEF COMPLAINT:  Respiratory distress   ? ?History of Present Illness:  ?Linda Cervantes is a 57 y.o. female with a PMH significant for IV drug use, substance abuse, paranoid schizophrenia, depression, asthma, anxiety, and back pain who presented to [facility] ED for complaints of new onset mid to low back pain with associated difficulty urinating and possible lower extremity weakness. On admit she met criteria for sepsis and was admitted for further workup per TRH ? ?MRI spine on admission revealed epidural abscess extending from T12-L2 measuring 39mm at the thickest with severe spinal cord stenosis at L3-L4 and L4-L5. Neurosurgery was called and request urgent transfer to [facility] for further imaging and possible need for surgical intervention.  ? ?On arrival to [facility] attempts were made to obtain MRI of thoracic spine but patient did no tolerate flat position and quick displayed signs of respiratory distress. MRI was canceled and PCCM was consulted for possible need of airway support.  ? ?Pertinent  Medical History  ?IV drug use, substance abuse, paranoid schizophrenia, depression, asthma, anxiety, and back pain ? ?Significant Hospital Events: ?Including procedures, antibiotic start and stop dates in addition to other pertinent events   ?4/6 admitted for acute back pain, inability to urinate and possible lower extremity weakness. Found to have large epidural abscess ?4/6 to OR for emergency cervical and thoracic laminectomy ? ?Interim History / Subjective:  ?No overnight issues. She is awake and following commands but moves the LUE only. Nurse reports that patient appears anxious. Passing PS trial today.  ? ?Objective   ?Blood pressure (!) 141/66, pulse 70, temperature 99.2 ?F (37.3 ?C), temperature source Axillary, resp. rate (!) 23, height $RemoveBe'5\' 3"'YtrUTbnsL$  (1.6 m), weight 57.2 kg, SpO2  96 %. ?   ?Vent Mode: PSV;CPAP ?FiO2 (%):  [40 %] 40 % ?Set Rate:  [15 bmp] 15 bmp ?Vt Set:  [420 mL] 420 mL ?PEEP:  [5 cmH20] 5 cmH20 ?Pressure Support:  [10 cmH20] 10 cmH20 ?Plateau Pressure:  [21 cmH20] 21 cmH20  ? ?Intake/Output Summary (Last 24 hours) at 07/23/2021 0809 ?Last data filed at 07/13/2021 0700 ?Gross per 24 hour  ?Intake 1672.68 ml  ?Output 1015 ml  ?Net 657.68 ml  ? ?Filed Weights  ? 07/29/2021 0249 07/09/21 0423 07/11/2021 0341  ?Weight: 51.5 kg 55.1 kg 57.2 kg  ? ? ?Examination: ?Gen:      Intubated, sedated, acutely ill appearing ?HEENT:  ETT to vent ?Lungs:    sounds of mechanical ventilation auscultated no wheezes or crackles ?CV:         tachycardic, regular ?Abd:      + bowel sounds; soft, non-tender; no palpable masses, no distension ?Ext:    No edema ?Skin:      Warm and dry; no rashes ?Neuro:   sedated, RASS -1. She moves the LUE to command. Does not move any other extremities.  ? ? ?Resolved Hospital Problem list   ?Circulatory shock ?Hypokalemia ?hyponatremia ?Assessment & Plan:  ? ?Linda Cervantes is a 56 y.o. woman with history of tobacco and opioid dependence who presents with: ? ?Sepsis secondary to MRSA epidural abscess  ?With paraplegia and right sided hemiparesis.  ?S/p emergent thoracic laminectomy 07/03/2021 ?-MRI spine on admission revealed epidural abscess extending from T12-L2 measuring 12mm at the thickest with severe spinal cord stenosis at L3-L4 and L4-L5 ?Continue Vancomycin, appreciate  ID involvement.  ?Repeat blood cultures pending.  ?TTE negative for vegetation ?If blood cultures don't clear will need TEE.  ? ?Acute metabolic encephalopathy  ?IV drug abuse, polysubstance abuse  ?Likely secondary to infection with underlying psych disorder ?UDS positive for cocaine ?P: ?Neurosurgery consulted and following  ?ID following  ?Maintain neuro protective measures; goal for eurothermia, euglycemia, eunatermia, normoxia, and PCO2 goal of 35-40 ?Cessation education when able  ?EKG today   showed QtC 424. will increase methadone and add back half dose prozac and zyprexa. Repeat EKG again today.  ?Seizure precautions  ?Aspirations precautions  ? ?Acute hypoxemic respiratory failure ?- intubated for respiratory distress and hypoxemia prior to MRI and OR laminectomy ?- chest xray fairly clear with mild RLL atelectasis ?- suspect component of COPD with exacerbation given wheezing ?P: ?Continue prn nebs ?Coming down on ventilator settings. Wean PEEP and FiO2 for sats greater than 90%. ?support with lung protective strategies  ?Will plan for SAT/SBT today. She is much more alert than yesterday ?Head of bed elevated 30 degrees. ?Plateau pressures less than 30 cm H20.  ?Follow intermittent chest x-ray and ABG.   ?Ensure adequate pulmonary hygiene  ?Follow cultures  ?VAP bundle in place  ?PAD protocol ? ?Acute urinary retention ?P: ?Will d/c foley today and start retention meds ?At risk for neurogenic bladder ? ?Best Practice (right click and "Reselect all SmartList Selections" daily)  ? ?Diet/type: NPO with tube feeds. Will likely need cortrak s/p extubation ?DVT prophylaxis: SCD ?GI prophylaxis: PPI ?Lines: [name] ?Foley:  Yes, and it is still needed ?Code Status:  full code ?Last date of multidisciplinary goals of care discussion: I called her Significant other Marjory Lies on 07/09/21 and gave him an update.  ? ?Labs   ?CBC: ?Recent Labs  ?Lab 07/19/2021 ?1928 07/28/2021 ?0500 07/03/2021 ?0021 07/04/2021 ?0254 07/05/2021 ?2836 07/09/21 ?0441 07/21/2021 ?0340  ?WBC 20.6* 18.6*  --  17.9*  --  15.4* 12.8*  ?NEUTROABS 17.2* 15.3*  --   --   --   --   --   ?HGB 13.7 11.5* 10.5* 11.0* 11.2* 10.2* 9.8*  ?HCT 40.4 34.9* 31.0* 33.4* 33.0* 32.5* 31.0*  ?MCV 78.3* 78.6*  --  79.5*  --  83.1 82.7  ?PLT 235 214  --  218  --  260 239  ? ? ?Basic Metabolic Panel: ?Recent Labs  ?Lab 07/11/2021 ?1928 07/07/21 ?0500 07/19/2021 ?0021 07/21/2021 ?0254 07/13/2021 ?6294 07/03/2021 ?1648 07/09/21 ?0441 07/09/21 ?1725 07/10/21 ?0340  ?NA 130* 129* 134* 134* 133*   --  136  --  140  ?K 3.2* 3.4* 3.0* 3.5 3.8  --  3.3*  --  4.3  ?CL 93* 94*  --  102  --   --  104  --  108  ?CO2 22 24  --  25  --   --  26  --  27  ?GLUCOSE 119* 118*  --  110*  --   --  239*  --  169*  ?BUN 21* 15  --  11  --   --  24*  --  16  ?CREATININE 0.67 0.62  --  0.55  --   --  0.63  --  0.55  ?CALCIUM 9.8 9.0  --  8.4*  --   --  8.4*  --  8.5*  ?MG  --  1.7  --  1.7  --  2.6* 2.6* 2.2  --   ?PHOS  --   --   --   --   --  3.6 2.3* 2.3*  --   ? ?GFR: ?Estimated Creatinine Clearance: 67.3 mL/min (by C-G formula based on SCr of 0.55 mg/dL). ?Recent Labs  ?Lab 07/29/2021 ?1945 07/28/2021 ?0016 07/06/2021 ?0500 07/24/2021 ?0254 07/09/21 ?0441 07/09/2021 ?0340  ?WBC  --   --  18.6* 17.9* 15.4* 12.8*  ?LATICACIDVEN 1.9 1.1  --   --   --   --   ? ? ?Liver Function Tests: ?Recent Labs  ?Lab 07/26/2021 ?1928 07/20/2021 ?0500 07/29/2021 ?0254  ?AST 53* 29 22  ?ALT 53* 37 27  ?ALKPHOS 85 69 57  ?BILITOT 1.1 0.7 0.9  ?PROT 9.0* 7.4 6.4*  ?ALBUMIN 4.1 3.2* 2.1*  ? ?Recent Labs  ?Lab 07/21/2021 ?1928  ?LIPASE 27  ? ?No results for input(s): AMMONIA in the last 168 hours. ? ?ABG ?   ?Component Value Date/Time  ? PHART 7.361 07/09/2021 0524  ? PCO2ART 41.1 07/16/2021 0524  ? PO2ART 55 (L) 07/12/2021 0524  ? HCO3 23.3 07/28/2021 0524  ? TCO2 24 07/09/2021 0524  ? ACIDBASEDEF 2.0 07/25/2021 0524  ? O2SAT 87 07/01/2021 0524  ?  ? ?Coagulation Profile: ?Recent Labs  ?Lab 07/20/2021 ?0500  ?INR 1.0  ? ? ?Critical care time: 35 mins  ? ?The patient is critically ill due to respiratory failure.  Critical care was necessary to treat or prevent imminent or life-threatening deterioration.  Critical care was time spent personally by me on the following activities: development of treatment plan with patient and/or surrogate as well as nursing, discussions with consultants, evaluation of patient's response to treatment, examination of patient, obtaining history from patient or surrogate, ordering and performing treatments and interventions, ordering and  review of laboratory studies, ordering and review of radiographic studies, pulse oximetry, re-evaluation of patient's condition and participation in multidisciplinary rounds.  ? ?Critical Care Time

## 2021-07-10 NOTE — Progress Notes (Signed)
Pharmacy Antibiotic Note ? ?Linda Cervantes is a 56 y.o. female admitted on 07/19/2021 with MRSA epidural abscess. Pharmacy has been consulted for Vancomycin dosing. ? ?A Vancomycin peak was ordered today however drawn inappropriately after only half the dose given per discussion with RN. Will plan to recheck a VP/VT after tomorrow's dose to ensure accurate.  ? ?Plan: ?- Continue Vancomycin 1250 mg IV every 24 hours ?- Will plan to check Vanc peak/trough after the 4/11 AM dose ?- Will continue to follow renal function, culture results, LOT, and antibiotic de-escalation plans  ? ?Height: '5\' 3"'$  (160 cm) ?Weight: 57.2 kg (126 lb 1.7 oz) ?IBW/kg (Calculated) : 52.4 ? ?Temp (24hrs), Avg:99.8 ?F (37.7 ?C), Min:97.6 ?F (36.4 ?C), Max:101.4 ?F (38.6 ?C) ? ?Recent Labs  ?Lab 07/13/2021 ?1928 07/16/2021 ?1945 07/29/2021 ?0016 07/19/2021 ?0500 07/27/2021 ?0254 07/09/21 ?0441 07/15/2021 ?2774 07/30/2021 ?1137  ?WBC 20.6*  --   --  18.6* 17.9* 15.4* 12.8*  --   ?CREATININE 0.67  --   --  0.62 0.55 0.63 0.55  --   ?LATICACIDVEN  --  1.9 1.1  --   --   --   --   --   ?VANCOPEAK  --   --   --   --   --   --   --  17*  ?  ?Estimated Creatinine Clearance: 67.3 mL/min (by C-G formula based on SCr of 0.55 mg/dL).   ? ?Allergies  ?Allergen Reactions  ? Amitriptyline Hcl Swelling and Other (See Comments)  ?  Face Swelling  ? Tylenol [Acetaminophen] Other (See Comments)  ?  Reaction not recalled by [name]  ? ? ?Antimicrobials this admission: ?Cefepime 4/7 >> 4/8 ?Vanc 4/7 >>  ? ?Dose adjustments this admission: ?n/a ? ?Microbiology results: ?4/7 BCx >> 4/4 MRSA ?4/7 Abscess cx >> MRSA ?4/9 BCx >> 1 of 3 GPC ?4/10 BCx >> ng<12h ? ?Thank you for allowing pharmacy to be a part of this patient?s care. ? ?Alycia Rossetti, PharmD, BCPS ?Infectious Diseases Clinical Pharmacist ?07/01/2021 3:27 PM  ? ?**Pharmacist phone directory can now be found on [email or web address] ([email or web address]).  Listed under [place].  ? ?

## 2021-07-10 NOTE — Transfer of Care (Signed)
Immediate Anesthesia Transfer of Care Note ? ?Patient: Linda Cervantes ? ?Procedure(s) Performed: CERVICAL ONE-THORACIC ONE HEMILAMINECTOMY FOR EPIDURAL ABSCESS (Spine Cervical) ? ?Patient Location: ICU ? ?Anesthesia Type:General ? ?Level of Consciousness: unresponsive and Patient remains intubated per anesthesia plan ? ?Airway & Oxygen Therapy: Patient remains intubated per anesthesia plan and Patient placed on Ventilator (see vital sign flow sheet for setting) ? ?Post-op Assessment: Report given to RN and Post -op Vital signs reviewed and stable ? ?Post vital signs: Reviewed ? ?Last Vitals:  ?Vitals Value Taken Time  ?BP    ?Temp    ?Pulse    ?Resp    ?SpO2    ? ? ?Last Pain:  ?Vitals:  ? 07/02/2021 1557  ?TempSrc: Axillary  ?PainSc:   ?   ? ?  ? ?Complications: No notable events documented. ?

## 2021-07-10 NOTE — Anesthesia Procedure Notes (Signed)
Arterial Line Insertion ?Start/End4/12/2021 4:15 PM ?Performed by: Alain Marion, CRNA, CRNA ? Preanesthetic checklist: patient identified, IV checked, site marked, risks and benefits discussed, surgical consent, monitors and equipment checked, pre-op evaluation, timeout performed and anesthesia consent ?Left, radial was placed ?Catheter size: 20 G ?Hand hygiene performed  and maximum sterile barriers used  ? ?Attempts: 1 ?Procedure performed without using ultrasound guided technique. ?Following insertion, dressing applied and Biopatch. ?Post procedure assessment: normal and unchanged ? ?Patient tolerated the procedure well with no immediate complications. ? ? ?

## 2021-07-10 NOTE — Progress Notes (Signed)
?   ? ? ? ? ?[place] for Infectious Disease ? ?Date of Admission:  07/01/2021    ? ?Principal Problem: ?  Epidural abscess ?Active Problems: ?  Sepsis ([place]) ?  Acute encephalopathy ?  Hypokalemia ?  Acute hyponatremia ?  Polysubstance abuse ([place]) ?  Microcytic anemia ?  Acute urinary retention ?  Leukocytosis ?  Abscess in epidural space of thoracic spine ?     ?    ?Assessment: ?37 YF with IVDA presented with low back pain and urinary retention found to have epidural abscess. Somnolent after receiving versed for imaging. Transferred form [facility] ED to [facility] for further evaluation. ?  ?#MRSA bacteremia with expansive epidural abscess SP evacuation and thoracic laminectomy on 4/7 ?#triplegia ?#IVDA(last injected cocaine, day prior to admission) ?#HCV-chronic ?-Urinary retention with scan+900cc at [facility] ED. UA with negative nitrites and leukocytes ?-MR lumbar spine showed T12-L2 epidural abscess measuring up to 64m. Moderate mass effect on conus medullaris and causa equina. Mri T spine showed large dorsal epidural abscess extends the length of thoracic spine ?- Taken to OR on 4/7 for laminectomy and evacuation of epidural Abscess. Pus encountered in OR-Cx+ MRSA. MRI c spine on 4/10 showed epidural abscess to T2.  NSY plan on OR for cervical laminectomy and evacuation of epidural abscess and cervical laminectomy ?-On IV vancomycin.  TTE no vegetations, will order TEE ?-She ws seen in ID clinic in 2019 for management of chronic  but lost to follow-up. 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 HCV Vl, 219,000 on 4/8. HIV , RPR, GC urine negative ?  ?Recommendations:  ?-Continue vancomycin  ?-TEE  later this week ?- Follow-up in ID clinic for HCV management, needs HBV vaccine, HAV ab pending ?-Follow blood Cx to ensure clearance(4/8+gpc) ?-Repeat blood Cx x2 ?-Plan on OR with NSY when available ? ? ?Microbiology:   ?Antibiotics: ?Vancomycin 4/6-p ?Cefepime 4/6-p ?  ?Cultures: ?Blood ?4/7 1./1 MRSA ?4/8 1/2  GPC ? ?SUBJECTIVE: ?Resting in bed. No new complaints.  ? ?Review of Systems: ?Review of Systems  ?Unable to perform ROS: Acuity of condition  ? ? ?Scheduled Meds: ? bethanechol  10 mg Per Tube TID  ? chlorhexidine gluconate (MEDLINE KIT)  15 mL Mouth Rinse BID  ? Chlorhexidine Gluconate Cloth  6 each Topical Daily  ? clonazePAM  0.5 mg Per Tube BID  ? docusate  100 mg Per Tube BID  ? feeding supplement (PROSource TF)  45 mL Per Tube Daily  ? fentaNYL (SUBLIMAZE) injection  50 mcg Intravenous Once  ? FLUoxetine  10 mg Per Tube Daily  ? insulin aspart  0-15 Units Subcutaneous Q4H  ? mouth rinse  15 mL Mouth Rinse 10 times per day  ? methadone  90 mg Per Tube Daily  ? mupirocin ointment  1 application. Nasal BID  ? OLANZapine  10 mg Per Tube Daily  ? pantoprazole sodium  40 mg Per Tube Daily  ? polyethylene glycol  17 g Per Tube Daily  ? sodium chloride flush  10-40 mL Intracatheter Q12H  ? ?Continuous Infusions: ? dexmedetomidine (PRECEDEX) IV infusion 1.2 mcg/kg/hr (07/28/2021 1000)  ? feeding supplement (VITAL 1.5 CAL) 1,000 mL (07/09/21 2057)  ? fentaNYL infusion INTRAVENOUS Stopped (07/09/21 1113)  ? vancomycin 166.7 mL/hr at 07/25/2021 1127  ? ?PRN Meds:.acetaminophen **OR** acetaminophen, albuterol, docusate, fentaNYL, fentaNYL (SUBLIMAZE) injection, labetalol, naLOXone (NARCAN)  injection, ondansetron (ZOFRAN) IV, polyethylene glycol, sodium chloride flush ?Allergies  ?Allergen Reactions  ? Amitriptyline Hcl  Swelling and Other (See Comments)  ?  Face Swelling  ? Tylenol [Acetaminophen] Other (See Comments)  ?  Reaction not recalled by [name]  ? ? ?OBJECTIVE: ?Vitals:  ? 07/11/2021 0900 07/30/2021 1000 07/03/2021 1117 07/23/2021 1154  ?BP: (!) 145/80 123/75    ?Pulse: 62 61    ?Resp: (!) 24 13    ?Temp:    99.7 ?F (37.6 ?C)  ?TempSrc:    Axillary  ?SpO2: 96% 100% 100%   ?Weight:      ?Height:      ? ?Body mass index is 22.34 kg/m?. ? ?Physical Exam ?HENT:  ?   Head: Normocephalic and atraumatic.  ?   Right Ear: Tympanic  membrane normal.  ?   Left Ear: Tympanic membrane normal.  ?   Nose: Nose normal.  ?   Mouth/Throat:  ?   Mouth: Mucous membranes are moist.  ?Eyes:  ?   Extraocular Movements: Extraocular movements intact.  ?   Conjunctiva/sclera: Conjunctivae normal.  ?   Pupils: Pupils are equal, round, and reactive to light.  ?Cardiovascular:  ?   Rate and Rhythm: Normal rate and regular rhythm.  ?   Heart sounds: No murmur heard. ?  No friction rub. No gallop.  ?Pulmonary:  ?   Effort: Pulmonary effort is normal.  ?   Breath sounds: Normal breath sounds.  ?Abdominal:  ?   General: Abdomen is flat.  ?   Palpations: Abdomen is soft.  ?Skin: ?   General: Skin is warm and dry.  ?Psychiatric:     ?   Mood and Affect: Mood normal.  ? ? ? ? ?Lab Results ?Lab Results  ?Component Value Date  ? WBC 12.8 (H) 07/09/2021  ? HGB 9.8 (L) 07/27/2021  ? HCT 31.0 (L) 07/04/2021  ? MCV 82.7 07/28/2021  ? PLT 239 07/18/2021  ?  ?Lab Results  ?Component Value Date  ? CREATININE 0.55 07/21/2021  ? BUN 16 07/30/2021  ? NA 140 07/12/2021  ? K 4.3 07/09/2021  ? CL 108 07/20/2021  ? CO2 27 07/04/2021  ?  ?Lab Results  ?Component Value Date  ? ALT 27 07/03/2021  ? AST 22 07/24/2021  ? GGT 10 06/20/2017  ? ALKPHOS 57 07/03/2021  ? BILITOT 0.9 07/27/2021  ?  ? ? ? ? ?Laurice Record, MD ?[facility] ?[facility] ?07/29/2021, 2:38 PM  ?

## 2021-07-10 NOTE — Progress Notes (Signed)
I have reviewed the patient's cervical MRI performed today.  She has a posterior cervical epidural abscess the way up to her foramen magnum.  There is moderate stenosis.  I have left a message with the patient's son, Ysidro Evert to discuss the situation. ?

## 2021-07-10 NOTE — Progress Notes (Signed)
Patient transported to MRI and back without complications. RN at bedside. 

## 2021-07-10 NOTE — Anesthesia Preprocedure Evaluation (Addendum)
Anesthesia Evaluation  ?Patient identified by MRN, date of birth, ID band ?Patient unresponsive ? ?General Assessment Comment:Intubated and sedated ? ?Reviewed: ?Allergy & Precautions, NPO status , Patient's Chart, lab work & pertinent test results, Unable to perform ROS - Chart review only ? ?History of Anesthesia Complications ?Negative for: history of anesthetic complications ? ?Airway ?Mallampati: Intubated ? ?TM Distance: >3 FB ?Neck ROM: Limited ? ? ? Dental ? ? ?Not assessed, intubated:   ?Pulmonary ?COPD, Current Smoker,  ?Intubated: VDRF, sepsis ?  ?breath sounds clear to auscultation ? ? ? ? ? ? Cardiovascular ?negative cardio ROS ? ? ?Rhythm:Regular Rate:Normal ? ?07/18/2021 ECHO: 60 to 65%. The LV has normal function,  no regional wall motion abnormalities, mild LVH. RVF is normal.  No significant valvular abnormalities ?  ?Neuro/Psych ?Anxiety Depression Sepsis, paraplegic with R hemiparesis ? ?MR lumbar spine showed T12-L2 epidural abscess measuring up to 11m. Moderate mass effect on conus medullaris and causa equina. Mri T spine showed large dorsal epidural abscess extends the length of thoracic spine ?- Taken to OR on 4/7 for laminectomy and evacuation of epidural Abscess. Pus encountered in OR-Cx+ MRSA. MRI c spine on 4/10 showed epidural abscess to T2.  NSY plan on OR for cervical laminectomy and evacuation of epidural abscess and cervical laminectomy ?  ? GI/Hepatic ?GERD  ,(+)  ?  ? substance abuse ? cocaine use and IV drug use, Hepatitis -, C  ?Endo/Other  ?negative endocrine ROS ? Renal/GU ?negative Renal ROS  ? ?  ?Musculoskeletal ? ?(+) narcotic dependent ? Abdominal ?  ?Peds ? Hematology ? ?(+) Blood dyscrasia (Hb 9.8, plt 238k), anemia ,   ?Anesthesia Other Findings ? ? Reproductive/Obstetrics ? ?  ? ? ? ? ? ? ? ? ? ? ? ? ? ?  ?  ? ? ? ? ? ? ? ?Anesthesia Physical ?Anesthesia Plan ? ?ASA: 4 ? ?Anesthesia Plan: General  ? ?Post-op Pain Management:    ? ?Induction: Inhalational ? ?PONV Risk Score and Plan: 2 and Treatment may vary due to age or medical condition ? ?Airway Management Planned: Oral ETT ? ?Additional Equipment: Arterial line ? ?Intra-op Plan:  ? ?Post-operative Plan: Post-operative intubation/ventilation ? ?Informed Consent: I have reviewed the patients History and Physical, chart, labs and discussed the procedure including the risks, benefits and alternatives for the proposed anesthesia with the patient or authorized representative who has indicated his/her understanding and acceptance.  ? ? ? ?Consent reviewed with POA ? ?Plan Discussed with: CRNA and Surgeon ? ?Anesthesia Plan Comments: (Telephone consent from patient's son)  ? ? ? ? ? ?Anesthesia Quick Evaluation ? ?

## 2021-07-10 NOTE — Progress Notes (Signed)
Subjective: ?The patient is heavily sedated on Precedex for her MRI scan. ? ?Objective: ?Vital signs in last 24 hours: ?Temp:  [97.6 ?F (36.4 ?C)-101.4 ?F (38.6 ?C)] 99.7 ?F (37.6 ?C) (04/10 1154) ?Pulse Rate:  [59-87] 61 (04/10 1000) ?Resp:  [13-33] 13 (04/10 1000) ?BP: (104-160)/(59-83) 123/75 (04/10 1000) ?SpO2:  [90 %-100 %] 100 % (04/10 1117) ?Arterial Line BP: (118-191)/(58-89) 191/85 (04/10 0800) ?FiO2 (%):  [40 %] 40 % (04/10 1117) ?Weight:  [57.2 kg] 57.2 kg (04/10 0341) ?Estimated body mass index is 22.34 kg/m? as calculated from the following: ?  Height as of this encounter: '5\' 3"'$  (1.6 m). ?  Weight as of this encounter: 57.2 kg. ? ? ?Intake/Output from previous day: ?04/09 0701 - 04/10 0700 ?In: 1720.2 [I.V.:255.3; NG/GT:960; IV Piggyback:504.9] ?Out: 9450 [Urine:975; Drains:40] ?Intake/Output this shift: ?Total I/O ?In: 198.7 [I.V.:42.7; NG/GT:120; IV Piggyback:36] ?Out: 200 [Urine:200] ? ?Physical exam the patient is unarousable. ? ?Lab Results: ?Recent Labs  ?  07/09/21 ?0441 07/27/2021 ?0340  ?WBC 15.4* 12.8*  ?HGB 10.2* 9.8*  ?HCT 32.5* 31.0*  ?PLT 260 239  ? ?BMET ?Recent Labs  ?  07/09/21 ?0441 07/11/2021 ?0340  ?NA 136 140  ?K 3.3* 4.3  ?CL 104 108  ?CO2 26 27  ?GLUCOSE 239* 169*  ?BUN 24* 16  ?CREATININE 0.63 0.55  ?CALCIUM 8.4* 8.5*  ? ? ?Studies/Results: ?DG Abd 1 View ? ?Result Date: 07/03/2021 ?CLINICAL DATA:  NG tube placement EXAM: ABDOMEN - 1 VIEW COMPARISON:  Earlier same day FINDINGS: NG tube is positioned with the tip in the distal stomach at the pylorus. Gaseous bowel distension noted over the visualized upper abdomen. IMPRESSION: NG tube tip is in the distal stomach near the pylorus. Electronically Signed   By: Misty Stanley M.D.   On: 07/20/2021 15:12  ? ?MR CERVICAL SPINE W WO CONTRAST ? ?Result Date: 07/27/2021 ?CLINICAL DATA:  Epidural abscess. Status post extensive thoracic laminotomies for epidural abscess evacuation on 07/30/2021. EXAM: MRI CERVICAL SPINE WITHOUT AND WITH CONTRAST  TECHNIQUE: Multiplanar and multiecho pulse sequences of the cervical spine, to include the craniocervical junction and cervicothoracic junction, were obtained without and with intravenous contrast. CONTRAST:  5.62m GADAVIST GADOBUTROL 1 MMOL/ML IV SOLN COMPARISON:  Thoracic spine MRI 07/14/2021 FINDINGS: Alignment: Trace retrolisthesis of C5 on C6. Vertebrae: No fracture, suspicious marrow lesion, or evidence of discitis. Partially visualized sequelae of recent left-sided laminotomies in the included upper thoracic spine. Cord: No convincing cord signal abnormality in the cervical spine. Partially visualized T2 hyperintensity in the thoracic spinal cord at T4-5 likely reflecting edema and not clearly present on the prior thoracic spine MRI. Posterior Fossa, vertebral arteries, paraspinal tissues: Postoperative changes in the posterior soft tissues of the included upper thoracic spine including a fluid collection extending to the T1 level with a drain in place. Disc levels: A peripherally enhancing dorsal epidural fluid collection extends from the foramen magnum to the T2 level and measures approximately 5 mm in AP thickness throughout most of this extent. There is associated spinal stenosis with ventral displacement of the spinal cord and effacement of CSF throughout the cervical spine. Spinal stenosis is most severe at C5-6 due to a combination of the epidural fluid collection and disc bulging with narrowing of the thecal sac to 5 mm AP diameter and moderate cord flattening. There is residual dorsal epidural enhancement below the T2 level in the included portion of the thoracic spine without a sizable residual epidural fluid collection visible at these levels on  sagittal images. IMPRESSION: 1. Dorsal epidural abscess extending from the foramen magnum to T2 with associated spinal stenosis, most severe at C5-6. 2. Partially visualized edema in the upper thoracic spinal cord which extends more proximally than on the  prior thoracic MRI. Electronically Signed   By: Logan Bores M.D.   On: 07/26/2021 11:25  ? ?ECHOCARDIOGRAM COMPLETE ? ?Result Date: 07/15/2021 ?   ECHOCARDIOGRAM REPORT   Patient Name:   Linda Cervantes Date of Exam: 07/25/2021 Medical Rec #:  [id number]       Height:       63.0 in Accession #:    [id number]      Weight:       113.5 lb Date of Birth:  29-Aug-1965       BSA:          1.520 m? Patient Age:    56 years        BP:           106/63 mmHg Patient Gender: F               HR:           118 bpm. Exam Location:  Inpatient Procedure: 2D Echo, Cardiac Doppler and Color Doppler Indications:    Epidural abscess. Septic embolization. Bacteremia  History:        Patient has no prior history of Echocardiogram examinations.                 Abnormal ECG, COPD, Arrythmias:Tachycardia,                 Signs/Symptoms:Bacteremia and Dyspnea; Risk Factors:Current                 Smoker. IVDU. Polysubstance abuse.  Sonographer:    Roseanna Rainbow RDCS Referring Phys: [id number] Criss Pallone  Sonographer Comments: Echo performed with patient supine and on artificial respirator, suboptimal parasternal window and Technically difficult study due to poor echo windows. IMPRESSIONS  1. Left ventricular ejection fraction, by estimation, is 60 to 65%. The left ventricle has normal function. The left ventricle has no regional wall motion abnormalities. There is mild left ventricular hypertrophy. Left ventricular diastolic parameters were normal.  2. Right ventricular systolic function is normal. The right ventricular size is normal.  3. The mitral valve is normal in structure. No evidence of mitral valve regurgitation. No evidence of mitral stenosis.  4. The aortic valve is normal in structure. Aortic valve regurgitation is not visualized. No aortic stenosis is present.  5. The inferior vena cava is normal in size with greater than 50% respiratory variability, suggesting right atrial pressure of 3 mmHg. FINDINGS  Left Ventricle: Left ventricular  ejection fraction, by estimation, is 60 to 65%. The left ventricle has normal function. The left ventricle has no regional wall motion abnormalities. The left ventricular internal cavity size was normal in size. There is  mild left ventricular hypertrophy. Left ventricular diastolic parameters were normal. Right Ventricle: The right ventricular size is normal. No increase in right ventricular wall thickness. Right ventricular systolic function is normal. Left Atrium: Left atrial size was normal in size. Right Atrium: Right atrial size was normal in size. Pericardium: There is no evidence of pericardial effusion. Mitral Valve: The mitral valve is normal in structure. No evidence of mitral valve regurgitation. No evidence of mitral valve stenosis. Tricuspid Valve: The tricuspid valve is normal in structure. Tricuspid valve regurgitation is trivial. No evidence of tricuspid stenosis. Aortic Valve: The aortic  valve is normal in structure. Aortic valve regurgitation is not visualized. No aortic stenosis is present. Pulmonic Valve: The pulmonic valve was not well visualized. Pulmonic valve regurgitation is not visualized. No evidence of pulmonic stenosis. Aorta: The aortic root is normal in size and structure. Venous: The inferior vena cava is normal in size with greater than 50% respiratory variability, suggesting right atrial pressure of 3 mmHg. IAS/Shunts: No atrial level shunt detected by color flow Doppler.  LEFT VENTRICLE PLAX 2D LVIDd:         3.40 cm     Diastology LVIDs:         2.05 cm     LV e' medial:    10.20 cm/s LV PW:         0.80 cm     LV E/e' medial:  7.0 LV IVS:        1.20 cm     LV e' lateral:   8.58 cm/s LVOT diam:     1.90 cm     LV E/e' lateral: 8.3 LV SV:         54 LV SV Index:   36 LVOT Area:     2.84 cm?  LV Volumes (MOD) LV vol d, MOD A2C: 34.6 ml LV vol d, MOD A4C: 49.8 ml LV vol s, MOD A2C: 9.1 ml LV vol s, MOD A4C: 12.9 ml LV SV MOD A2C:     25.5 ml LV SV MOD A4C:     49.8 ml LV SV MOD BP:       30.9 ml RIGHT VENTRICLE             IVC RV S prime:     27.60 cm/s  IVC diam: 2.60 cm TAPSE (M-mode): 1.3 cm LEFT ATRIUM             Index       RIGHT ATRIUM          Index LA diam:        3.20 cm 2.1

## 2021-07-11 ENCOUNTER — Encounter (HOSPITAL_COMMUNITY): Payer: Self-pay | Admitting: Neurosurgery

## 2021-07-11 DIAGNOSIS — R9431 Abnormal electrocardiogram [ECG] [EKG]: Secondary | ICD-10-CM

## 2021-07-11 DIAGNOSIS — J9601 Acute respiratory failure with hypoxia: Secondary | ICD-10-CM | POA: Diagnosis not present

## 2021-07-11 DIAGNOSIS — G062 Extradural and subdural abscess, unspecified: Secondary | ICD-10-CM | POA: Diagnosis not present

## 2021-07-11 LAB — CBC
HCT: 35.3 % — ABNORMAL LOW (ref 36.0–46.0)
Hemoglobin: 11.3 g/dL — ABNORMAL LOW (ref 12.0–15.0)
MCH: 26.5 pg (ref 26.0–34.0)
MCHC: 32 g/dL (ref 30.0–36.0)
MCV: 82.7 fL (ref 80.0–100.0)
Platelets: 234 10*3/uL (ref 150–400)
RBC: 4.27 MIL/uL (ref 3.87–5.11)
RDW: 16.5 % — ABNORMAL HIGH (ref 11.5–15.5)
WBC: 13 10*3/uL — ABNORMAL HIGH (ref 4.0–10.5)
nRBC: 0 % (ref 0.0–0.2)

## 2021-07-11 LAB — TYPE AND SCREEN
ABO/RH(D): A NEG
Antibody Screen: NEGATIVE
Unit division: 0
Unit division: 0

## 2021-07-11 LAB — BASIC METABOLIC PANEL
Anion gap: 7 (ref 5–15)
BUN: 12 mg/dL (ref 6–20)
CO2: 29 mmol/L (ref 22–32)
Calcium: 8.3 mg/dL — ABNORMAL LOW (ref 8.9–10.3)
Chloride: 104 mmol/L (ref 98–111)
Creatinine, Ser: 0.37 mg/dL — ABNORMAL LOW (ref 0.44–1.00)
GFR, Estimated: >60 mL/min (ref >60)
Glucose, Bld: 99 mg/dL (ref 70–99)
Potassium: 4.1 mmol/L (ref 3.5–5.1)
Sodium: 140 mmol/L (ref 135–145)

## 2021-07-11 LAB — BPAM RBC
Blood Product Expiration Date: 202304262359
Blood Product Expiration Date: 202304262359
ISSUE DATE / TIME: 202304101623
ISSUE DATE / TIME: 202304101623
Unit Type and Rh: 600
Unit Type and Rh: 600

## 2021-07-11 LAB — GLUCOSE, CAPILLARY
Glucose-Capillary: 114 mg/dL — ABNORMAL HIGH (ref 70–99)
Glucose-Capillary: 93 mg/dL (ref 70–99)
Glucose-Capillary: 107 mg/dL — ABNORMAL HIGH (ref 70–99)
Glucose-Capillary: 89 mg/dL (ref 70–99)
Glucose-Capillary: 93 mg/dL (ref 70–99)
Glucose-Capillary: 115 mg/dL — ABNORMAL HIGH (ref 70–99)

## 2021-07-11 LAB — VANCOMYCIN, PEAK: Vancomycin Pk: 33 ug/mL (ref 30–40)

## 2021-07-11 LAB — PHOSPHORUS: Phosphorus: 3.1 mg/dL (ref 2.5–4.6)

## 2021-07-11 NOTE — Progress Notes (Signed)
Called patient's son Linda Cervantes to update him on patient's status. Notified him about his mother's inability to move her legs or her right hand, as well as the potential that she may not be able to breath on her own. I worry that a likely scenario for her will be vent dependent and bed bound in a nursing home. This is complicated by her drug use, tobacco use history and poor functional status  ? ?Linda Cervantes explains that he used to use drugs but is clean, and that he and his mom are close, but his two sisters and his grandmother don't want anything to do with Linda Cervantes. He says it's just him and Linda Cervantes's father as well as Linda Cervantes (significant other) who he doesn't have a good relationship with.  ? ?I've asked him to find a time for all of [place] to sit either in person or over the phone to discuss goals of care and specifically tracheostomy. He will get back to [place] and let us know. ? ?Additional CC time 15 minutes.  ? ?Lenice Llamas, MD ?Pulmonary and Critical Care Medicine ?[place] ?07/11/2021 6:03 PM ?Pager: see AMION ? ?If no response to pager, please call critical care on call (see AMION) until 7pm ?After 7:00 pm call Elink   ? ?

## 2021-07-11 NOTE — Progress Notes (Signed)
? ?NAME:  Linda Cervantes, MRN:  [id number], DOB:  [birth year], LOS: 4 ?ADMISSION DATE:  07/16/2021, CONSULTATION DATE:  07/29/2021 ?REFERRING MD:  Dr. Lonny Prude, CHIEF COMPLAINT:  Respiratory distress   ? ?History of Present Illness:  ?Linda Cervantes is a 56 y.o. female with a PMH significant for IV drug use, substance abuse, paranoid schizophrenia, depression, asthma, anxiety, and back pain who presented to [facility] ED for complaints of new onset mid to low back pain with associated difficulty urinating and possible lower extremity weakness. On admit she met criteria for sepsis and was admitted for further workup per [facility] ? ?MRI spine on admission revealed epidural abscess extending from T12-L2 measuring 34m at the thickest with severe spinal cord stenosis at L3-L4 and L4-L5. [facility]  ? ?On arrival to [facility] attempts were made to obtain MRI of thoracic spine but patient did no tolerate flat position and quick displayed signs of respiratory distress. MRI was canceled and PCCM was consulted for possible need of airway support.  ? ?Pertinent  Medical History  ?IV drug use, substance abuse, paranoid schizophrenia, depression, asthma, anxiety, and back pain ? ?Significant Hospital Events: ?Including procedures, antibiotic start and stop dates in addition to other pertinent events   ?4/6 admitted for acute back pain, inability to urinate and possible lower extremity weakness. Found to have large epidural abscess ?4/6 to OR for emergency cervical and thoracic laminectomy ?4/10 developed triplegia. Had MRI spine which showed cervical epidural abscess. Emergent OR trip for hemilaminectomy. ? ?Interim History / Subjective:  ?Yesterday developed weakness of right hand. Had MRI which showed cervical epidural abscess extending to the foramen magnum. Went to OR yesterday evening for C1-T1 hemilaminectomy. This morning awake and  follows commands but somnolent. On higher vent settings at 60% still ? ?Objective   ?Blood pressure 135/79, pulse 62, temperature 99 ?F (37.2 ?C), temperature source Axillary, resp. rate (!) 21, height _0  (1.6 m), weight 57.2 kg, SpO2 97 %. ?   ?Vent Mode: PRVC ?FiO2 (%):  [40 %-60 %] 50 % ?Set Rate:  [15 bmp] 15 bmp ?Vt Set:  [420 mL] 420 mL ?PEEP:  [5 cmH20] 5 cmH20 ?Plateau Pressure:  [12 cmH20-18 cmH20] 12 cmH20  ? ?Intake/Output Summary (Last 24 hours) at 07/11/2021 0916 ?Last data filed at 07/11/2021 0857 ?Gross per 24 hour  ?Intake 3118.61 ml  ?Output 1740 ml  ?Net 1378.61 ml  ? ?Filed Weights  ? 07/16/2021 0249 07/09/21 0423 07/20/2021 0341  ?Weight: 51.5 kg 55.1 kg 57.2 kg  ? ? ?Examination: ?Gen:      Intubated, sedated, acutely ill appearing ?HEENT:  ETT to vent ?Lungs:    sounds of mechanical ventilation auscultated no wheezes or crackles ?CV:         RRR no mrg ?Abd:      + bowel sounds; soft, non-tender; no palpable masses, no distension ?Ext:    No edema ?Skin:      Warm and dry; no rashes ?Neuro:   sedated, not responsive.  ? ? ?Resolved Hospital Problem list   ?Circulatory shock ?Hypokalemia ?hyponatremia ?Assessment & Plan:  ? ?Linda MATSUMURAis a 56y.o. woman with history of tobacco and opioid dependence who presents with: ? 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  ?S/p emergent thoracic laminectomy 07/23/2021 and cervical laminectomy on 07/23/2021.  ?- Continue Vancomycin, appreciate ID involvement.  ?  Repeat blood cultures pending.  ?- TTE negative for vegetation ?- repeat blood cultures 4/10 ng day one. If blood cultures don't clear will need TEE.  ? ?Acute metabolic encephalopathy  ?IV drug abuse, polysubstance abuse  ?Likely secondary to infection with underlying psych disorder ?UDS positive for cocaine ?P: ?Neurosurgery consulted and following  ?ID following  ?Maintain neuro protective measures; goal for eurothermia, euglycemia, eunatermia, normoxia, and PCO2  goal of 35-40 ?Cessation education when able  ?EKG yesterday  showed QtC 277. continue methadone, half dose prozac and zyprexa. Repeat EKG again today.  ? ?Acute hypoxemic respiratory failure ?- intubated for respiratory distress and hypoxemia prior to MRI and OR laminectomy ?- chest xray fairly clear with mild RLL atelectasis ?- suspect component of COPD with exacerbation given wheezing ?P: ?Continue prn nebs ?Coming down on ventilator settings. Wean PEEP and FiO2 for sats greater than 90%. ?support with lung protective strategies  ?Will do SAT and SBT today. Unclear if she will be able to protect her airway given extent of cervical abscess. May need to discuss tracheostomy with family.  ?Head of bed elevated 30 degrees. ?Plateau pressures less than 30 cm H20.  ?Follow intermittent chest x-ray and ABG.   ?Ensure adequate pulmonary hygiene  ?VAP bundle in place  ?PAD protocol ? ?Acute urinary retention ?P: ?Started retention meds already.  ?At risk for neurogenic bladder ? ?Best Practice (right click and "Reselect all SmartList Selections" daily)  ? ?Diet/type: NPO with tube feeds. Will likely need cortrak s/p extubation ?DVT prophylaxis: SCD ?GI prophylaxis: PPI ?Lines: [name] ?Foley:  Yes, and it is still needed ?Code Status:  full code ?Last date of multidisciplinary goals of care discussion: discussed at bedside with Marjory Lies her significant other on 07/03/2021.  ? ?Labs   ?CBC: ?Recent Labs  ?Lab 07/24/2021 ?1928 07/05/2021 ?0500 07/15/2021 ?0021 07/12/2021 ?0254 07/12/2021 ?0254 07/09/21 ?0441 07/09/2021 ?2706 07/25/2021 ?1738 07/03/2021 ?2125 07/11/21 ?0601  ?WBC 20.6* 18.6*  --  17.9*  --  15.4* 12.8*  --   --  13.0*  ?NEUTROABS 17.2* 15.3*  --   --   --   --   --   --   --   --   ?HGB 13.7 11.5*   < > 11.0*   < > 10.2* 9.8* 7.1* 9.8* 11.3*  ?HCT 40.4 34.9*   < > 33.4*   < > 32.5* 31.0* 21.0* 31.8* 35.3*  ?MCV 78.3* 78.6*  --  79.5*  --  83.1 82.7  --   --  82.7  ?PLT 235 214  --  218  --  260 239  --   --  234  ? < > = values in  this interval not displayed.  ? ? ?Basic Metabolic Panel: ?Recent Labs  ?Lab 07/01/2021 ?0500 07/24/2021 ?0021 07/12/2021 ?0254 07/19/2021 ?2376 07/22/2021 ?1648 07/09/21 ?0441 07/09/21 ?1725 07/04/2021 ?2831 07/12/2021 ?1738 07/11/21 ?0601  ?NA 129*   < > 134* 133*  --  136  --  140 148* 140  ?K 3.4*   < > 3.5 3.8  --  3.3*  --  4.3 3.2* 4.1  ?CL 94*  --  102  --   --  104  --  108  --  104  ?CO2 24  --  25  --   --  26  --  27  --  29  ?GLUCOSE 118*  --  110*  --   --  239*  --  169*  --  99  ?  BUN 15  --  11  --   --  24*  --  16  --  12  ?CREATININE 0.62  --  0.55  --   --  0.63  --  0.55  --  0.37*  ?CALCIUM 9.0  --  8.4*  --   --  8.4*  --  8.5*  --  8.3*  ?MG 1.7  --  1.7  --  2.6* 2.6* 2.2  --   --   --   ?PHOS  --   --   --   --  3.6 2.3* 2.3*  --   --  3.1  ? < > = values in this interval not displayed.  ? ?GFR: ?Estimated Creatinine Clearance: 67.3 mL/min (A) (by C-G formula based on SCr of 0.37 mg/dL (L)). ?Recent Labs  ?Lab 07/03/2021 ?1945 07/29/2021 ?0016 07/09/2021 ?0500 07/05/2021 ?0254 07/09/21 ?0441 07/29/2021 ?3374 07/11/21 ?0601  ?WBC  --   --    < > 17.9* 15.4* 12.8* 13.0*  ?LATICACIDVEN 1.9 1.1  --   --   --   --   --   ? < > = values in this interval not displayed.  ? ? ?Liver Function Tests: ?Recent Labs  ?Lab 07/22/2021 ?1928 07/03/2021 ?0500 07/23/2021 ?0254  ?AST 53* 29 22  ?ALT 53* 37 27  ?ALKPHOS 85 69 57  ?BILITOT 1.1 0.7 0.9  ?PROT 9.0* 7.4 6.4*  ?ALBUMIN 4.1 3.2* 2.1*  ? ?Recent Labs  ?Lab 07/20/2021 ?1928  ?LIPASE 27  ? ?No results for input(s): AMMONIA in the last 168 hours. ? ?ABG ?   ?Component Value Date/Time  ? PHART 7.397 07/03/2021 1738  ? PCO2ART 33.4 07/15/2021 1738  ? PO2ART 125 (H) 07/14/2021 1738  ? HCO3 20.5 07/24/2021 1738  ? TCO2 22 07/27/2021 1738  ? ACIDBASEDEF 4.0 (H) 07/02/2021 1738  ? O2SAT 99 07/18/2021 1738  ?  ? ?Coagulation Profile: ?Recent Labs  ?Lab 07/16/2021 ?0500  ?INR 1.0  ? ? ?Critical care time: 34 mins  ? ?The patient is critically ill due to respiratory failure.  Critical care was  necessary to treat or prevent imminent or life-threatening deterioration.  Critical care was time spent personally by me on the following activities: development of treatment plan with patient and/or surrogate

## 2021-07-11 NOTE — Anesthesia Postprocedure Evaluation (Signed)
Anesthesia Post Note ? ?Patient: Linda Cervantes ? ?Procedure(s) Performed: CERVICAL ONE-THORACIC ONE HEMILAMINECTOMY FOR EPIDURAL ABSCESS (Spine Cervical) ? ?  ? ?Patient location during evaluation: ICU ?Anesthesia Type: General ?Level of consciousness: sedated and patient remains intubated per anesthesia plan ?Pain management: pain level controlled ?Vital Signs Assessment: post-procedure vital signs reviewed and stable ?Respiratory status: patient on ventilator - see flowsheet for VS and patient remains intubated per anesthesia plan ?Cardiovascular status: stable ?Postop Assessment: no apparent nausea or vomiting ?Anesthetic complications: no ?Comments: VSS, but patient remains intubated, on antibiotics for extensive epidural abscess/infection ? ? ?No notable events documented. ? ?Last Vitals:  ?Vitals:  ? 07/11/21 1150 07/11/21 1200  ?BP:  (!) 144/82  ?Pulse:  66  ?Resp:  (!) 9  ?Temp: 37.8 ?C   ?SpO2:  98%  ?  ?Last Pain:  ?Vitals:  ? 07/11/21 1150  ?TempSrc: Axillary  ?PainSc:   ? ? ?  ?  ?  ?  ?  ?  ? ?Yidel Teuscher,E. Harvie Morua ? ? ? ? ?

## 2021-07-11 NOTE — Progress Notes (Signed)
Subjective: ?The patient is somnolent but arousable.  She is in no apparent distress. ? ?Objective: ?Vital signs in last 24 hours: ?Temp:  [97.8 ?F (36.6 ?C)-99.7 ?F (37.6 ?C)] 99.2 ?F (37.3 ?C) (04/11 0400) ?Pulse Rate:  [56-76] 62 (04/11 0750) ?Resp:  [8-30] 21 (04/11 0750) ?BP: (109-170)/(58-96) 135/79 (04/11 0700) ?SpO2:  [87 %-100 %] 97 % (04/11 0750) ?Arterial Line BP: (141-191)/(57-85) 151/65 (04/11 0700) ?FiO2 (%):  [40 %-60 %] 50 % (04/11 0750) ?Estimated body mass index is 22.34 kg/m? as calculated from the following: ?  Height as of this encounter: '5\' 3"'$  (1.6 m). ?  Weight as of this encounter: 57.2 kg. ? ? ?Intake/Output from previous day: ?04/10 0701 - 04/11 0700 ?In: 3124.3 [I.V.:1788.2; Blood:630; NG/GT:120; IV Piggyback:586.1] ?Out: 1740 [RKYHC:6237; Blood:50] ?Intake/Output this shift: ?No intake/output data recorded. ? ?Physical exam the patient is somnolent but arousable.  She remains triplegic.  She is weak in her left upper extremity but follows commands there. ? ?Lab Results: ?Recent Labs  ?  07/18/2021 ?6283 07/05/2021 ?1738 07/29/2021 ?2125 07/11/21 ?0601  ?WBC 12.8*  --   --  13.0*  ?HGB 9.8*   < > 9.8* 11.3*  ?HCT 31.0*   < > 31.8* 35.3*  ?PLT 239  --   --  234  ? < > = values in this interval not displayed.  ? ?BMET ?Recent Labs  ?  07/28/2021 ?1517 07/27/2021 ?1738 07/11/21 ?0601  ?NA 140 148* 140  ?K 4.3 3.2* 4.1  ?CL 108  --  104  ?CO2 27  --  29  ?GLUCOSE 169*  --  99  ?BUN 16  --  12  ?CREATININE 0.55  --  0.37*  ?CALCIUM 8.5*  --  8.3*  ? ? ?Studies/Results: ?DG Cervical Spine 1 View ? ?Result Date: 07/23/2021 ?CLINICAL DATA:  Surgical localization. EXAM: DG CERVICAL SPINE - 1 VIEW COMPARISON:  MRI of same day. FINDINGS: Single intraoperative cross-table lateral projection was obtained of the cervical spine. This demonstrates a surgical probe directed toward the posterior portion of C2-3 level. IMPRESSION: Surgical localization as described above. Electronically Signed   By: Marijo Conception  M.D.   On: 07/12/2021 18:15  ? ?MR CERVICAL SPINE W WO CONTRAST ? ?Result Date: 07/27/2021 ?CLINICAL DATA:  Epidural abscess. Status post extensive thoracic laminotomies for epidural abscess evacuation on 07/13/2021. EXAM: MRI CERVICAL SPINE WITHOUT AND WITH CONTRAST TECHNIQUE: Multiplanar and multiecho pulse sequences of the cervical spine, to include the craniocervical junction and cervicothoracic junction, were obtained without and with intravenous contrast. CONTRAST:  5.71m GADAVIST GADOBUTROL 1 MMOL/ML IV SOLN COMPARISON:  Thoracic spine MRI 07/30/2021 FINDINGS: Alignment: Trace retrolisthesis of C5 on C6. Vertebrae: No fracture, suspicious marrow lesion, or evidence of discitis. Partially visualized sequelae of recent left-sided laminotomies in the included upper thoracic spine. Cord: No convincing cord signal abnormality in the cervical spine. Partially visualized T2 hyperintensity in the thoracic spinal cord at T4-5 likely reflecting edema and not clearly present on the prior thoracic spine MRI. Posterior Fossa, vertebral arteries, paraspinal tissues: Postoperative changes in the posterior soft tissues of the included upper thoracic spine including a fluid collection extending to the T1 level with a drain in place. Disc levels: A peripherally enhancing dorsal epidural fluid collection extends from the foramen magnum to the T2 level and measures approximately 5 mm in AP thickness throughout most of this extent. There is associated spinal stenosis with ventral displacement of the spinal cord and effacement of CSF throughout the  cervical spine. Spinal stenosis is most severe at C5-6 due to a combination of the epidural fluid collection and disc bulging with narrowing of the thecal sac to 5 mm AP diameter and moderate cord flattening. There is residual dorsal epidural enhancement below the T2 level in the included portion of the thoracic spine without a sizable residual epidural fluid collection visible at these  levels on sagittal images. IMPRESSION: 1. Dorsal epidural abscess extending from the foramen magnum to T2 with associated spinal stenosis, most severe at C5-6. 2. Partially visualized edema in the upper thoracic spinal cord which extends more proximally than on the prior thoracic MRI. Electronically Signed   By: Logan Bores M.D.   On: 07/21/2021 11:25   ? ?Assessment/Plan: ?Postop day #1/4: Hopefully the patient's strength will improve, but I am not particularly optimistic.  We will continue with vancomycin for her staph infection. ? LOS: 4 days  ? ? ? ?Ophelia Charter ?07/11/2021, 7:58 AM ? ? ? ? ? ?

## 2021-07-11 NOTE — Progress Notes (Addendum)
?   ? ? ? ? ?[facility] Infect[facility] ? ?Date of Admission:  07/04/2021    ? ?Principal Problem: ?  Epidural abscess ?Active Problems: ?  Sepsis ([place]) ?  Acute encephalopathy ?  Hypokalemia ?  Acute hyponatremia ?  Polysubstance abuse ([place]) ?  Microcytic anemia ?  Acute urinary retention ?  Leukocytosis ?  Abscess in epidural space of thoracic spine ?  Abscess in epidural space of cervical spine ?     ?    ?Assessment: ?63 YF with IVDA presented with low back pain and urinary retention found to have epidural abscess. Somnolent after receiving versed for imaging. Transferred form [facility] ED to [facility] for further evaluation. ?  ?#MRSA bacteremia with expansive epidural abscess SP evacuation and thoracic laminectomy and C1-T1 hemilaminectomy ?#triplegia ?#IVDA(last injected cocaine, day prior to admission) ?#HCV-chronic ?-Urinary retention with scan+900cc at [facility] ED. UA with negative nitrites and leukocytes ?-MR lumbar spine showed T12-L2 epidural abscess measuring up to 2mm. Moderate mass effect on conus medullaris and causa equina. Mri T spine showed large dorsal epidural abscess extends the length of thoracic spine ?- Taken to OR on 4/7 for laminectomy and evacuation of epidural Abscess. Pus encountered in OR-Cx+ MRSA. MRI c spine on 4/10 showed epidural abscess to T2.  Taken to OR on 4/10 for left C1-T1 hemilaminectomy and drainage for drainage of cervical epidural abscess. ?-On IV vancomycin.  TTE no vegetations, will order TEE ?-She ws seen in ID clinic in 2019 for management of chronic  but lost to follow-up. In 2019:  HCV VL 78 F0, genotype 2. HCV Vl, 219,000 on 4/8. HIV , RPR, GC urine negative ?  ?Recommendations:  ?-Continue vancomycin  ?-TEE  later this week ?- Follow-up in ID clinic for HCV management, needs HBV and HAV vaccine  ?-Follow blood Cx to ensure clearance(4/8+MRSA) ? ? ? ?Microbiology:   ?Antibiotics: ?Vancomycin 4/6-p ?Cefepime 4/6-4/8 ?  ?Cultures: ?Blood ?4/7 3/3 MRSA ?4/8 1/2  MRSA ?4/10p ? ?SUBJECTIVE: ?Resting in bed. No new complaints.  ? ?Review of Systems: ?Review of Systems  ?Unable to perform ROS: Acuity of condition  ?All other systems reviewed and are negative. ? ? ?Scheduled Meds: ? acetaminophen  1,000 mg Per Tube Q6H  ? bethanechol  10 mg Per Tube TID  ? chlorhexidine gluconate (MEDLINE KIT)  15 mL Mouth Rinse BID  ? Chlorhexidine Gluconate Cloth  6 each Topical Daily  ? clonazePAM  0.5 mg Per Tube BID  ? docusate  100 mg Per Tube BID  ? feeding supplement (PROSource TF)  45 mL Per Tube Daily  ? fentaNYL (SUBLIMAZE) injection  50 mcg Intravenous Once  ? FLUoxetine  10 mg Per Tube Daily  ? insulin aspart  0-15 Units Subcutaneous Q4H  ? mouth rinse  15 mL Mouth Rinse 10 times per day  ? methadone  90 mg Per Tube Daily  ? mupirocin ointment  1 application. Nasal BID  ? OLANZapine  10 mg Per Tube Daily  ? pantoprazole (PROTONIX) IV  40 mg Intravenous QHS  ? polyethylene glycol  17 g Per Tube Daily  ? sodium chloride flush  10-40 mL Intracatheter Q12H  ? ?Continuous Infusions: ? sodium chloride    ? dexmedetomidine (PRECEDEX) IV infusion 1.2 mcg/kg/hr (07/11/21 0800)  ? feeding supplement (VITAL 1.5 CAL) 1,000 mL (07/09/21 2057)  ? fentaNYL infusion INTRAVENOUS 100 mcg/hr (07/11/21 0800)  ? lactated ringers 75 mL/hr at 07/11/21 0857  ? vancomycin Stopped (07/23/2021 1604)  ? ?  PRN Meds:.acetaminophen **OR** acetaminophen, acetaminophen **OR** acetaminophen, albuterol, alum & mag hydroxide-simeth, bisacodyl, docusate, fentaNYL, fentaNYL (SUBLIMAZE) injection, hydrALAZINE, labetalol, morphine injection, naLOXone (NARCAN)  injection, ondansetron (ZOFRAN) IV, ondansetron **OR** ondansetron (ZOFRAN) IV, [DISCONTINUED] menthol-cetylpyridinium **OR** phenol, polyethylene glycol, sodium chloride flush ?Allergies  ?Allergen Reactions  ? Amitriptyline Hcl Swelling and Other (See Comments)  ?  Face Swelling  ? Tylenol [Acetaminophen] Other (See Comments)  ?  Reaction not recalled by [name]   ? ? ?OBJECTIVE: ?Vitals:  ? 07/11/21 0600 07/11/21 0700 07/11/21 0750 07/11/21 0800  ?BP: 136/80 135/79    ?Pulse: 62 (!) 56 62   ?Resp: 13 12 (!) 21   ?Temp:    99 ?F (37.2 ?C)  ?TempSrc:    Axillary  ?SpO2: 98% 98% 97%   ?Weight:      ?Height:      ? ?Body mass index is 22.34 kg/m?. ? ?Physical Exam ?HENT:  ?   Head: Normocephalic and atraumatic.  ?   Right Ear: Tympanic membrane normal.  ?   Left Ear: Tympanic membrane normal.  ?   Nose: Nose normal.  ?   Mouth/Throat:  ?   Mouth: Mucous membranes are moist.  ?Eyes:  ?   Extraocular Movements: Extraocular movements intact.  ?   Conjunctiva/sclera: Conjunctivae normal.  ?   Pupils: Pupils are equal, round, and reactive to light.  ?Cardiovascular:  ?   Rate and Rhythm: Normal rate and regular rhythm.  ?   Heart sounds: No murmur heard. ?  No friction rub. No gallop.  ?Pulmonary:  ?   Effort: Pulmonary effort is normal.  ?   Breath sounds: Normal breath sounds.  ?Abdominal:  ?   General: Abdomen is flat.  ?   Palpations: Abdomen is soft.  ?Skin: ?   General: Skin is warm and dry.  ?Psychiatric:     ?   Mood and Affect: Mood normal.  ? ? ? ? ?Lab Results ?Lab Results  ?Component Value Date  ? WBC 13.0 (H) 07/11/2021  ? HGB 11.3 (L) 07/11/2021  ? HCT 35.3 (L) 07/11/2021  ? MCV 82.7 07/11/2021  ? PLT 234 07/11/2021  ?  ?Lab Results  ?Component Value Date  ? CREATININE 0.37 (L) 07/11/2021  ? BUN 12 07/11/2021  ? NA 140 07/11/2021  ? K 4.1 07/11/2021  ? CL 104 07/11/2021  ? CO2 29 07/11/2021  ?  ?Lab Results  ?Component Value Date  ? ALT 27 07/30/2021  ? AST 22 07/09/2021  ? GGT 10 06/20/2017  ? ALKPHOS 57 07/07/2021  ? BILITOT 0.9 07/10/2021  ?  ? ? ? ? ?Laurice Record, MD ?[facility] for Infectious Disease ? [facility] ?07/11/2021, 9:07 AM  ?

## 2021-07-11 NOTE — Progress Notes (Signed)
Pharmacy Antibiotic Note ? ?Linda Cervantes is a 56 y.o. female admitted on 07/24/2021 with MRSA bacteremia and epidural abscess. Pharmacy has been consulted for Vancomycin dosing. ? ?A Vancomycin peak on 4/11 resulted as 33 mcg/ml and a trough 4/12 as 5 mcg/ml for an estimated AUC of 385 which is SUBtherapeutic. Renal function stable.  ? ?Plan: ?- Adjust to Vancomycin 750 mg IV every 12 hours for new eAUC of 461 ?- Will continue to follow renal function, culture results, LOT, and antibiotic de-escalation plans  ? ?Height: '5\' 3"'$  (160 cm) ?Weight: 57.2 kg (126 lb 1.7 oz) ?IBW/kg (Calculated) : 52.4 ? ?Temp (24hrs), Avg:99 ?F (37.2 ?C), Min:97.8 ?F (36.6 ?C), Max:100 ?F (37.8 ?C) ? ?Recent Labs  ?Lab 07/02/2021 ?1945 07/27/2021 ?0016 07/18/2021 ?0500 07/09/2021 ?0254 07/09/21 ?0441 07/05/2021 ?3559 07/11/2021 ?1137 07/11/21 ?0601 07/11/21 ?1230  ?WBC  --   --  18.6* 17.9* 15.4* 12.8*  --  13.0*  --   ?CREATININE  --   --  0.62 0.55 0.63 0.55  --  0.37*  --   ?LATICACIDVEN 1.9 1.1  --   --   --   --   --   --   --   ?VANCOPEAK  --   --   --   --   --   --  17*  --  74  ?  ?Estimated Creatinine Clearance: 67.3 mL/min (A) (by C-G formula based on SCr of 0.37 mg/dL (L)).   ? ?Allergies  ?Allergen Reactions  ? Amitriptyline Hcl Swelling and Other (See Comments)  ?  Face Swelling  ? Tylenol [Acetaminophen] Other (See Comments)  ?  Reaction not recalled by [name]  ? ? ?Antimicrobials this admission: ?Cefepime 4/7 >> 4/8 ?Vancomycin 4/7 >> ? ?Dose adjustments this admission: ?n/a ? ?Microbiology results: ?4/7 BCx >> 4/4 MRSA ?4/7 Epidural abscess >> MRSA ?4/9 BCx >> 1/3 presumably MRSA ?4/10 BCx >> ng<12h ?4/10 tissue cx >> MRSA ? ?Thank you for allowing pharmacy to be a part of this patient?s care. ? ?Alycia Rossetti, PharmD, BCPS ?Infectious Diseases Clinical Pharmacist ?07/11/2021 3:00 PM  ? ?**Pharmacist phone directory can now be found on [email or web address] ([email or web address]).  Listed under [place].  ? ?

## 2021-07-12 DIAGNOSIS — G062 Extradural and subdural abscess, unspecified: Secondary | ICD-10-CM | POA: Diagnosis not present

## 2021-07-12 DIAGNOSIS — G061 Intraspinal abscess and granuloma: Secondary | ICD-10-CM | POA: Diagnosis not present

## 2021-07-12 DIAGNOSIS — G9340 Encephalopathy, unspecified: Secondary | ICD-10-CM | POA: Diagnosis not present

## 2021-07-12 LAB — GLUCOSE, CAPILLARY
Glucose-Capillary: 98 mg/dL (ref 70–99)
Glucose-Capillary: 89 mg/dL (ref 70–99)
Glucose-Capillary: 104 mg/dL — ABNORMAL HIGH (ref 70–99)
Glucose-Capillary: 125 mg/dL — ABNORMAL HIGH (ref 70–99)
Glucose-Capillary: 122 mg/dL — ABNORMAL HIGH (ref 70–99)
Glucose-Capillary: 168 mg/dL — ABNORMAL HIGH (ref 70–99)

## 2021-07-12 LAB — AEROBIC/ANAEROBIC CULTURE W GRAM STAIN (SURGICAL/DEEP WOUND): Gram Stain: NONE SEEN

## 2021-07-12 LAB — CULTURE, BLOOD (ROUTINE X 2): Special Requests: ADEQUATE

## 2021-07-12 LAB — CBC
HCT: 34.6 % — ABNORMAL LOW (ref 36.0–46.0)
Hemoglobin: 11.3 g/dL — ABNORMAL LOW (ref 12.0–15.0)
MCH: 26.9 pg (ref 26.0–34.0)
MCHC: 32.7 g/dL (ref 30.0–36.0)
MCV: 82.4 fL (ref 80.0–100.0)
Platelets: 249 10*3/uL (ref 150–400)
RBC: 4.2 MIL/uL (ref 3.87–5.11)
RDW: 16.4 % — ABNORMAL HIGH (ref 11.5–15.5)
WBC: 15.4 10*3/uL — ABNORMAL HIGH (ref 4.0–10.5)
nRBC: 0 % (ref 0.0–0.2)

## 2021-07-12 LAB — BASIC METABOLIC PANEL
Anion gap: 7 (ref 5–15)
BUN: 11 mg/dL (ref 6–20)
CO2: 30 mmol/L (ref 22–32)
Calcium: 8.4 mg/dL — ABNORMAL LOW (ref 8.9–10.3)
Chloride: 100 mmol/L (ref 98–111)
Creatinine, Ser: 0.45 mg/dL (ref 0.44–1.00)
GFR, Estimated: >60 mL/min (ref >60)
Glucose, Bld: 95 mg/dL (ref 70–99)
Potassium: 4.1 mmol/L (ref 3.5–5.1)
Sodium: 137 mmol/L (ref 135–145)

## 2021-07-12 LAB — VANCOMYCIN, TROUGH: Vancomycin Tr: 5 ug/mL — ABNORMAL LOW (ref 15–20)

## 2021-07-12 MED ORDER — VITAL 1.5 CAL PO LIQD
1000.0000 mL | ORAL | Status: DC
  Administered 2021-07-13: 1000 mL
  Filled 2021-07-12: qty 1000

## 2021-07-12 MED ORDER — POLYETHYLENE GLYCOL 3350 17 G PO PACK
17.0000 g | PACK | Freq: Two times a day (BID) | ORAL | Status: DC
  Administered 2021-07-12 – 2021-07-13 (×2): 17 g
  Filled 2021-07-12 (×2): qty 1

## 2021-07-12 MED ORDER — PANTOPRAZOLE 2 MG/ML SUSPENSION
40.0000 mg | Freq: Every day | ORAL | Status: DC
  Administered 2021-07-12 – 2021-07-13 (×2): 40 mg
  Filled 2021-07-12 (×3): qty 20

## 2021-07-12 MED ORDER — VANCOMYCIN HCL 750 MG/150ML IV SOLN
750.0000 mg | Freq: Two times a day (BID) | INTRAVENOUS | Status: DC
  Administered 2021-07-12 – 2021-07-14 (×5): 750 mg via INTRAVENOUS
  Filled 2021-07-12 (×5): qty 150

## 2021-07-12 MED ORDER — PROSOURCE TF PO LIQD
45.0000 mL | Freq: Two times a day (BID) | ORAL | Status: DC
  Administered 2021-07-12 – 2021-07-13 (×3): 45 mL
  Filled 2021-07-12 (×6): qty 45

## 2021-07-12 NOTE — Progress Notes (Signed)
? ?NAME:  Linda Cervantes, MRN:  [id number], DOB:  [birth year], LOS: 5 ?ADMISSION DATE:  07/09/2021, CONSULTATION DATE:  07/14/2021 ?REFERRING MD:  Dr. Lonny Prude, CHIEF COMPLAINT:  Respiratory distress   ? ?History of Present Illness:  ?Linda Cervantes is a 56 y.o. female with a PMH significant for IV drug use, substance abuse, paranoid schizophrenia, depression, asthma, anxiety, and back pain who presented to [facility] ED for complaints of new onset mid to low back pain with associated difficulty urinating and possible lower extremity weakness. On admit she met criteria for sepsis and was admitted for further workup per TRH ? ?MRI spine on admission revealed epidural abscess extending from T12-L2 measuring 32mm at the thickest with severe spinal cord stenosis at L3-L4 and L4-L5. Neurosurgery was called and request urgent transfer to [facility] for further imaging and possible need for surgical intervention.  ? ?On arrival to [facility] attempts were made to obtain MRI of thoracic spine but patient did no tolerate flat position and quick displayed signs of respiratory distress. MRI was canceled and PCCM was consulted for possible need of airway support.  ? ?Pertinent  Medical History  ?IV drug use, substance abuse, paranoid schizophrenia, depression, asthma, anxiety, and back pain ? ?Significant Hospital Events: ?Including procedures, antibiotic start and stop dates in addition to other pertinent events   ?4/6 admitted for acute back pain, inability to urinate and possible lower extremity weakness. Found to have large epidural abscess ?4/6 to OR for emergency cervical and thoracic laminectomy ?4/10 developed triplegia. Had MRI spine which showed cervical epidural abscess. Emergent OR trip for hemilaminectomy. ? ?Interim History / Subjective:  ?No overnight events. Was more hypoxemic and required higher vent settings.  ? ?Objective   ?Blood pressure (!) 143/82, pulse 63, temperature (!) 100.9 ?F (38.3 ?C), temperature source Axillary, resp. rate  12, height $RemoveBe'5\' 3"'vTFsdiyIl$  (1.6 m), weight 58.7 kg, SpO2 93 %. ?   ?Vent Mode: PSV;CPAP ?FiO2 (%):  [40 %-70 %] 50 % ?Set Rate:  [15 bmp] 15 bmp ?Vt Set:  [420 mL] 420 mL ?PEEP:  [5 cmH20] 5 cmH20 ?Pressure Support:  [10 cmH20] 10 cmH20 ?Plateau Pressure:  [12 cmH20-22 cmH20] 18 cmH20  ? ?Intake/Output Summary (Last 24 hours) at 07/12/2021 0942 ?Last data filed at 07/12/2021 0800 ?Gross per 24 hour  ?Intake 2455 ml  ?Output 1700 ml  ?Net 755 ml  ? ?Filed Weights  ? 07/09/21 0423 07/09/2021 0341 07/12/21 0500  ?Weight: 55.1 kg 57.2 kg 58.7 kg  ? ? ?Examination: ?Gen:      Intubated, sedated, acutely ill appearing ?HEENT:  ETT to vent ?Lungs:    sounds of mechanical ventilation auscultated no wheezes or crackles ?CV:         RRR no mrg ?Abd:      + bowel sounds; soft, non-tender; no palpable masses, no distension ?Ext:    No edema ?Skin:      Warm and dry; no rashes ?Neuro:   awake, alert, follows commands and nods approrpriately. Only moves her LUE.  ? ? ? ?Resolved Hospital Problem list   ?Circulatory shock ?Hypokalemia ?hyponatremia ?Assessment & Plan:  ? ?Linda Cervantes is a 56 y.o. woman with history of tobacco and opioid dependence who presents with: ? 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  ?S/p emergent thoracic laminectomy 07/15/2021 and cervical laminectomy on 07/18/2021.  ?- Continue Vancomycin, appreciate ID involvement ?- TTE negative for vegetation ?- repeat blood cultures 4/10 ngtd ? ?  Acute metabolic encephalopathy  ?IV drug abuse, polysubstance abuse  ?Likely secondary to infection with underlying psych disorder ?UDS positive for cocaine ?P: ?Neurosurgery consulted and following  ?ID following  ?Maintain neuro protective measures; goal for eurothermia, euglycemia, eunatermia, normoxia, and PCO2 goal of 35-40 ?Cessation education when able  ?EKG yesterday  showed QtC 277. continue methadone, half dose prozac and zyprexa. Repeat EKG again today.  ? ?Acute hypoxemic respiratory  failure ?COPD ?P: ?Continue prn nebs ?Coming down on ventilator settings. Wean PEEP and FiO2 for sats greater than 90%. ?support with lung protective strategies  ?Head of bed elevated 30 degrees. ?Plateau pressures less than 30 cm H20.  ?Follow intermittent chest x-ray and ABG.   ?Ensure adequate pulmonary hygiene  ?VAP bundle in place  ?PAD protocol ? ?Acute urinary retention ?P: ?Started retention meds already.  ?At risk for neurogenic bladder ? ?Goals of Care ?Discussed with son as well as with Dr Arnoldo Morale. High likelihood she will not be able to be extubated. Planning on family meeting with son, father and significant other at some point - to dicsuss trach/peg. ? ?Best Practice (right click and "Reselect all SmartList Selections" daily)  ? ?Diet/type: NPO with tube feeds. Will likely need cortrak s/p extubation ?DVT prophylaxis: SCD ?GI prophylaxis: PPI ?Lines: [name] ?Foley:  Yes, and it is no longer needed and removal ordered  ?Code Status:  full code ?Last date of multidisciplinary goals of care discussion: discussed at bedside with Marjory Lies her significant other on 07/29/2021.  ? ?Labs   ?CBC: ?Recent Labs  ?Lab 07/21/2021 ?1928 07/16/2021 ?0500 07/04/2021 ?0021 07/14/2021 ?0254 07/23/2021 ?6503 07/09/21 ?0441 07/27/2021 ?5465 07/02/2021 ?1738 07/06/2021 ?2125 07/11/21 ?0601 07/12/21 ?6812  ?WBC 20.6* 18.6*  --  17.9*  --  15.4* 12.8*  --   --  13.0* 15.4*  ?NEUTROABS 17.2* 15.3*  --   --   --   --   --   --   --   --   --   ?HGB 13.7 11.5*   < > 11.0*   < > 10.2* 9.8* 7.1* 9.8* 11.3* 11.3*  ?HCT 40.4 34.9*   < > 33.4*   < > 32.5* 31.0* 21.0* 31.8* 35.3* 34.6*  ?MCV 78.3* 78.6*  --  79.5*  --  83.1 82.7  --   --  82.7 82.4  ?PLT 235 214  --  218  --  260 239  --   --  234 249  ? < > = values in this interval not displayed.  ? ? ?Basic Metabolic Panel: ?Recent Labs  ?Lab 07/28/2021 ?0500 07/09/2021 ?0021 07/18/2021 ?0254 07/06/2021 ?7517 07/08/21 ?1648 07/09/21 ?0441 07/09/21 ?1725 07/11/2021 ?0340 07/09/2021 ?1738 07/11/21 ?0601 07/12/21 ?0017   ?NA 129*   < > 134*   < >  --  136  --  140 148* 140 137  ?K 3.4*   < > 3.5   < >  --  3.3*  --  4.3 3.2* 4.1 4.1  ?CL 94*  --  102  --   --  104  --  108  --  104 100  ?CO2 24  --  25  --   --  26  --  27  --  29 30  ?GLUCOSE 118*  --  110*  --   --  239*  --  169*  --  99 95  ?BUN 15  --  11  --   --  24*  --  16  --  12 11  ?CREATININE 0.62  --  0.55  --   --  0.63  --  0.55  --  0.37* 0.45  ?CALCIUM 9.0  --  8.4*  --   --  8.4*  --  8.5*  --  8.3* 8.4*  ?MG 1.7  --  1.7  --  2.6* 2.6* 2.2  --   --   --   --   ?PHOS  --   --   --   --  3.6 2.3* 2.3*  --   --  3.1  --   ? < > = values in this interval not displayed.  ? ?GFR: ?Estimated Creatinine Clearance: 67.3 mL/min (by C-G formula based on SCr of 0.45 mg/dL). ?Recent Labs  ?Lab 07/02/2021 ?1945 07/27/2021 ?0016 07/01/2021 ?0500 07/09/21 ?0441 07/01/2021 ?0340 07/11/21 ?0601 07/12/21 ?8257  ?WBC  --   --    < > 15.4* 12.8* 13.0* 15.4*  ?LATICACIDVEN 1.9 1.1  --   --   --   --   --   ? < > = values in this interval not displayed.  ? ? ?Liver Function Tests: ?Recent Labs  ?Lab 07/16/2021 ?1928 07/18/2021 ?0500 07/18/2021 ?0254  ?AST 53* 29 22  ?ALT 53* 37 27  ?ALKPHOS 85 69 57  ?BILITOT 1.1 0.7 0.9  ?PROT 9.0* 7.4 6.4*  ?ALBUMIN 4.1 3.2* 2.1*  ? ?Recent Labs  ?Lab 07/26/2021 ?1928  ?LIPASE 27  ? ?No results for input(s): AMMONIA in the last 168 hours. ? ?ABG ?   ?Component Value Date/Time  ? PHART 7.397 07/19/2021 1738  ? PCO2ART 33.4 07/06/2021 1738  ? PO2ART 125 (H) 07/19/2021 1738  ? HCO3 20.5 07/18/2021 1738  ? TCO2 22 07/01/2021 1738  ? ACIDBASEDEF 4.0 (H) 07/09/2021 1738  ? O2SAT 99 07/24/2021 1738  ?  ? ?Coagulation Profile: ?Recent Labs  ?Lab 07/27/2021 ?0500  ?INR 1.0  ? ? ?Critical care time: 34 mins  ? ?The patient is critically ill due to respiratory failure.  Critical care was necessary to treat or prevent imminent or life-threatening deterioration.  Critical care was time spent personally by me on the following activities: development of treatment plan with patient  and/or surrogate as well as nursing, discussions with consultants, evaluation of patient's response to treatment, examination of patient, obtaining history from patient or surrogate, ordering and performing tre

## 2021-07-12 NOTE — Progress Notes (Signed)
PT able to tolerate wean 10/5 50% for 30 minutes. Pt returned to full support settings for continued desaturations. Fio2 increased to 60% at this time.  ?

## 2021-07-12 NOTE — Progress Notes (Signed)
?  Interdisciplinary Goals of Care Family Meeting ? ? ?Date carried out: 07/12/2021 ? ?Location of the meeting: Bedside ? ?Member's involved: Physician, Bedside Registered Nurse, and Family Member or next of kin ? ?Durable Power of Tour manager: Linda Cervantes, patient's son   ? ?Discussion: We discussed goals of care for Linda Cervantes .  Patient's son Linda Cervantes, Significant other Linda Cervantes, and bedside nurse Linda Cervantes at bedside. We discussed the severity of the patient's infection including exhausting all therapeutic opportunities with her 2 surgeries.  She continues on vancomycin and will likely be on this for several weeks.  Discussed my concerns that she was already having respiratory issues prior to surgery then required intubation, and that she has COPD and this is putting her at high risk for ventilator related complications.  Furthermore she has a high cervical spine and infection and is at risk for diaphragmatic weakness.  She is currently triplegic and can only move her left upper extremity.  I discussed with Dr. Arnoldo Morale and we are concerned that this function has not returned yet.  There is a very high likelihood that she will be ventilator dependent with a PEG tube and tracheostomy in all likelihood not be able to be liberated from the ventilator.  This would mean that she would end up in a skilled nursing facility.  The patient's son explained that he would like to give his mother a chance.  He is close to his mother but his 2 sisters do not have as good of a relationship.  The patient's father is also closely involved in her life but her mother is not.  He notes that after talking with his family he thinks they do want a pursue all aggressive measures and give her every chance to survive.  I discussed that that is probably reasonable in the next up to be tracheostomy and PEG tube.  She continues to be at risk for hospital associated infections given her underlying medical problems.  They will  discuss with the family and get back to me.  We will have case management reach out to them to discuss LTAC options. ? ?Code status: Full Code ? ?Disposition: Continue current acute care ? ?Time spent for the meeting: 30 minutes ? ? ? ?Spero Geralds, MD ? ?07/12/2021, 2:52 PM ? ? ?

## 2021-07-12 NOTE — Progress Notes (Signed)
Subjective: ?The patient is alert and in no apparent distress. ? ?Objective: ?Vital signs in last 24 hours: ?Temp:  [99.7 ?F (37.6 ?C)-102.4 ?F (39.1 ?C)] 100.9 ?F (38.3 ?C) (04/12 0800) ?Pulse Rate:  [60-90] 63 (04/12 0800) ?Resp:  [9-26] 12 (04/12 0800) ?BP: (117-203)/(70-86) 143/82 (04/12 0800) ?SpO2:  [92 %-99 %] 93 % (04/12 0842) ?Arterial Line BP: (133-213)/(60-90) 159/71 (04/12 0800) ?FiO2 (%):  [40 %-70 %] 50 % (04/12 0842) ?Weight:  [58.7 kg] 58.7 kg (04/12 0500) ?Estimated body mass index is 22.92 kg/m? as calculated from the following: ?  Height as of this encounter: '5\' 3"'$  (1.6 m). ?  Weight as of this encounter: 58.7 kg. ? ? ?Intake/Output from previous day: ?04/11 0701 - 04/12 0700 ?In: 2349.1 [I.V.:2235.2; IV Piggyback:113.9] ?Out: 9983 [Urine:1650] ?Intake/Output this shift: ?Total I/O ?In: 207.6 [I.V.:207.6] ?Out: 250 [Urine:250] ? ?Physical exam the patient is triplegic.  She continues to move her left upper extremity. ? ?Lab Results: ?Recent Labs  ?  07/11/21 ?0601 07/12/21 ?3825  ?WBC 13.0* 15.4*  ?HGB 11.3* 11.3*  ?HCT 35.3* 34.6*  ?PLT 234 249  ? ?BMET ?Recent Labs  ?  07/11/21 ?0601 07/12/21 ?0539  ?NA 140 137  ?K 4.1 4.1  ?CL 104 100  ?CO2 29 30  ?GLUCOSE 99 95  ?BUN 12 11  ?CREATININE 0.37* 0.45  ?CALCIUM 8.3* 8.4*  ? ? ?Studies/Results: ?DG Cervical Spine 1 View ? ?Result Date: 07/13/2021 ?CLINICAL DATA:  Surgical localization. EXAM: DG CERVICAL SPINE - 1 VIEW COMPARISON:  MRI of same day. FINDINGS: Single intraoperative cross-table lateral projection was obtained of the cervical spine. This demonstrates a surgical probe directed toward the posterior portion of C2-3 level. IMPRESSION: Surgical localization as described above. Electronically Signed   By: Marijo Conception M.D.   On: 07/25/2021 18:15  ? ?MR CERVICAL SPINE W WO CONTRAST ? ?Result Date: 07/01/2021 ?CLINICAL DATA:  Epidural abscess. Status post extensive thoracic laminotomies for epidural abscess evacuation on 07/16/2021. EXAM: MRI  CERVICAL SPINE WITHOUT AND WITH CONTRAST TECHNIQUE: Multiplanar and multiecho pulse sequences of the cervical spine, to include the craniocervical junction and cervicothoracic junction, were obtained without and with intravenous contrast. CONTRAST:  5.75m GADAVIST GADOBUTROL 1 MMOL/ML IV SOLN COMPARISON:  Thoracic spine MRI 07/05/2021 FINDINGS: Alignment: Trace retrolisthesis of C5 on C6. Vertebrae: No fracture, suspicious marrow lesion, or evidence of discitis. Partially visualized sequelae of recent left-sided laminotomies in the included upper thoracic spine. Cord: No convincing cord signal abnormality in the cervical spine. Partially visualized T2 hyperintensity in the thoracic spinal cord at T4-5 likely reflecting edema and not clearly present on the prior thoracic spine MRI. Posterior Fossa, vertebral arteries, paraspinal tissues: Postoperative changes in the posterior soft tissues of the included upper thoracic spine including a fluid collection extending to the T1 level with a drain in place. Disc levels: A peripherally enhancing dorsal epidural fluid collection extends from the foramen magnum to the T2 level and measures approximately 5 mm in AP thickness throughout most of this extent. There is associated spinal stenosis with ventral displacement of the spinal cord and effacement of CSF throughout the cervical spine. Spinal stenosis is most severe at C5-6 due to a combination of the epidural fluid collection and disc bulging with narrowing of the thecal sac to 5 mm AP diameter and moderate cord flattening. There is residual dorsal epidural enhancement below the T2 level in the included portion of the thoracic spine without a sizable residual epidural fluid collection visible at these  levels on sagittal images. IMPRESSION: 1. Dorsal epidural abscess extending from the foramen magnum to T2 with associated spinal stenosis, most severe at C5-6. 2. Partially visualized edema in the upper thoracic spinal cord  which extends more proximally than on the prior thoracic MRI. Electronically Signed   By: Logan Bores M.D.   On: 07/05/2021 11:25   ? ?Assessment/Plan: ?Holospinal epidural abscess: Continue supportive care and IV antibiotics.  It looks like she will need a trach and PEG if she wants to continue with aggressive care. ? LOS: 5 days  ? ? ? ?Ophelia Charter ?07/12/2021, 9:18 AM ? ? ? ? ? ?

## 2021-07-12 NOTE — Progress Notes (Signed)
?   ? ? ? ? ?Linda Cervantes for Infectious Disease ? ?Date of Admission:  07/05/2021    ? ?Principal Problem: ?  Epidural abscess ?Active Problems: ?  Sepsis (Linda Cervantes) ?  Acute encephalopathy ?  Hypokalemia ?  Acute hyponatremia ?  Polysubstance abuse ([place]) ?  Microcytic anemia ?  Acute urinary retention ?  Leukocytosis ?  Abscess in epidural space of thoracic spine ?  Abscess in epidural space of cervical spine ?     ?    ?Assessment: ?[name] with IVDA presented with low back pain and urinary retention found to have epidural abscess. Somnolent after receiving versed for imaging. Transferred form [facility] ED to [facility] for further evaluation. ?  ?#MRSA bacteremia with expansive epidural abscess SP evacuation and thoracic laminectomy and C1-T1 hemilaminectomy ?#triplegia ?#IVDA(last injected cocaine, day prior to admission) ?#HCV-chronic ?-Urinary retention with scan+900cc at [facility] ED. UA with negative nitrites and leukocytes ?-MR lumbar spine showed T12-L2 epidural abscess measuring up to 15mm. Moderate mass effect on conus medullaris and causa equina. Mri T spine showed large dorsal epidural abscess extends the length of thoracic spine ?- Taken to OR on 4/7 for laminectomy and evacuation of epidural Abscess. Pus encountered in OR-Cx+ MRSA. MRI c spine on 4/10 showed epidural abscess to T2.  Taken to OR on 4/10 for left C1-T1 hemilaminectomy and drainage for drainage of cervical epidural abscess. ?-On IV vancomycin.  TTE no vegetations, will order TEE ?-She ws seen in ID clinic in 2019 for management of chronic  but lost to follow-up. In 2019:  HCV VL 34 F0, genotype 2. HCV Vl, 219,000 on 4/8. HIV , RPR, GC urine negative ?  ?Recommendations:  ?-Continue vancomycin  ?-TEE  later this week ?- Follow-up in ID clinic for HCV management, needs HBV and HAV vaccine  ?-Follow blood Cx to ensure clearance(4/8+MRSA) ? ? ? ?Microbiology:   ?Antibiotics: ?Vancomycin 4/6-p ?Cefepime 4/6-4/8 ?  ?Cultures: ?Blood ?4/7 3/3 MRSA ?4/8 1/2  MRSA ?4/10p ? ?SUBJECTIVE: ?Resting in bed. No new complaints. Able to respond to some commands ? ?Review of Systems: ?Review of Systems  ?Unable to perform ROS: Acuity of condition  ?All other systems reviewed and are negative. ? ? ?Scheduled Meds: ? bethanechol  10 mg Per Tube TID  ? chlorhexidine gluconate (MEDLINE KIT)  15 mL Mouth Rinse BID  ? Chlorhexidine Gluconate Cloth  6 each Topical Daily  ? clonazePAM  0.5 mg Per Tube BID  ? docusate  100 mg Per Tube BID  ? feeding supplement (PROSource TF)  45 mL Per Tube BID  ? fentaNYL (SUBLIMAZE) injection  50 mcg Intravenous Once  ? FLUoxetine  10 mg Per Tube Daily  ? insulin aspart  0-15 Units Subcutaneous Q4H  ? mouth rinse  15 mL Mouth Rinse 10 times per day  ? methadone  90 mg Per Tube Daily  ? OLANZapine  10 mg Per Tube Daily  ? pantoprazole (PROTONIX) IV  40 mg Intravenous QHS  ? polyethylene glycol  17 g Per Tube Daily  ? sodium chloride flush  10-40 mL Intracatheter Q12H  ? ?Continuous Infusions: ? sodium chloride    ? dexmedetomidine (PRECEDEX) IV infusion 1 mcg/kg/hr (07/12/21 0800)  ? feeding supplement (VITAL 1.5 CAL)    ? fentaNYL infusion INTRAVENOUS 150 mcg/hr (07/12/21 0950)  ? lactated ringers 75 mL/hr at 07/12/21 0800  ? vancomycin    ? ?PRN Meds:.acetaminophen **OR** acetaminophen, albuterol, alum & mag hydroxide-simeth, bisacodyl, docusate, fentaNYL, fentaNYL (SUBLIMAZE)  injection, hydrALAZINE, labetalol, morphine injection, naLOXone (NARCAN)  injection, ondansetron (ZOFRAN) IV, ondansetron **OR** ondansetron (ZOFRAN) IV, [DISCONTINUED] menthol-cetylpyridinium **OR** phenol, polyethylene glycol, sodium chloride flush ?Allergies  ?Allergen Reactions  ? Amitriptyline Hcl Swelling and Other (See Comments)  ?  Face Swelling  ? Tylenol [Acetaminophen] Other (See Comments)  ?  Reaction not recalled by [name]  ? ? ?OBJECTIVE: ?Vitals:  ? 07/12/21 0600 07/12/21 0700 07/12/21 0800 07/12/21 0842  ?BP: 139/81 (!) 141/77 (!) 143/82   ?Pulse: 69 67 63   ?Resp:  $Remov'13 14 12   'cVMEBl$ ?Temp:   (!) 100.9 ?F (38.3 ?C)   ?TempSrc:   Axillary   ?SpO2: 95% 96% 97% 93%  ?Weight:      ?Height:      ? ?Body mass index is 22.92 kg/m?. ? ?Physical Exam ?HENT:  ?   Head: Normocephalic and atraumatic.  ?   Right Ear: Tympanic membrane normal.  ?   Left Ear: Tympanic membrane normal.  ?   Nose: Nose normal.  ?   Mouth/Throat:  ?   Mouth: Mucous membranes are moist.  ?Eyes:  ?   Extraocular Movements: Extraocular movements intact.  ?   Conjunctiva/sclera: Conjunctivae normal.  ?   Pupils: Pupils are equal, round, and reactive to light.  ?Cardiovascular:  ?   Rate and Rhythm: Normal rate and regular rhythm.  ?   Heart sounds: No murmur heard. ?  No friction rub. No gallop.  ?Pulmonary:  ?   Effort: Pulmonary effort is normal.  ?   Breath sounds: Normal breath sounds.  ?Abdominal:  ?   General: Abdomen is flat.  ?   Palpations: Abdomen is soft.  ?Skin: ?   General: Skin is warm and dry.  ?Psychiatric:     ?   Mood and Affect: Mood normal.  ? ? ? ? ?Lab Results ?Lab Results  ?Component Value Date  ? WBC 15.4 (H) 07/12/2021  ? HGB 11.3 (L) 07/12/2021  ? HCT 34.6 (L) 07/12/2021  ? MCV 82.4 07/12/2021  ? PLT 249 07/12/2021  ?  ?Lab Results  ?Component Value Date  ? CREATININE 0.45 07/12/2021  ? BUN 11 07/12/2021  ? NA 137 07/12/2021  ? K 4.1 07/12/2021  ? CL 100 07/12/2021  ? CO2 30 07/12/2021  ?  ?Lab Results  ?Component Value Date  ? ALT 27 07/30/2021  ? AST 22 07/02/2021  ? GGT 10 06/20/2017  ? ALKPHOS 57 07/14/2021  ? BILITOT 0.9 07/12/2021  ?  ? ? ? ? ?Laurice Record, MD ?[facility] for Infectious Disease ?[facility] ?07/12/2021, 10:49 AM  ?

## 2021-07-12 NOTE — Progress Notes (Signed)
Nutrition Follow-up ? ?DOCUMENTATION CODES:  ? ?Not applicable ? ?INTERVENTION:  ? ?Increase Vital 1.5 to 45 ml/hr (1080 ml daily) ?  ?Increase Prosource TF to 45 ml BID.   ?  ?Tube feeding regimen provides 1700 kcals, 89 grams of protein, and 820 ml of H2O.   ? ? ?NUTRITION DIAGNOSIS:  ? ?Inadequate oral intake related to inability to eat as evidenced by NPO status. ?Ongoing.  ? ?GOAL:  ? ?Patient will meet greater than or equal to 90% of their needs ?Met with TF at goal.  ? ?MONITOR:  ? ?Vent status, Labs, Weight trends, TF tolerance, Skin, I & O's ? ?REASON FOR ASSESSMENT:  ? ?Consult, Malnutrition Screening Tool, Ventilator ?Enteral/tube feeding initiation and management ? ?ASSESSMENT:  ? ?Linda Cervantes is a 56 y.o. female with a PMH significant for IV drug use, substance abuse, paranoid schizophrenia, depression, asthma, anxiety, and back pain who presented to [facility] ED for complaints of new onset mid to low back pain with associated difficulty urinating and possible lower extremity weakness. On admit she met criteria for sepsis and was admitted for further workup per TRH ? ?Pt remains on vent support. Per CCM high likelihood will not be extubated planning family meeting to discuss trach/PEG.  ? ?4/6 admitted for acute back pain, inability to urinate and possible lower extremity weakness. Found to have large epidural abscess ?4/6 to OR for emergency cervical and thoracic laminectomy ?4/10 developed triplegia. Had MRI spine which showed cervical epidural abscess. Emergent OR trip for hemilaminectomy ? ?Medications reviewed and include: colace, SSI, protonix, miralax ?Precedex ?Fentanyl  ?LR @ 75 ml/hr  ? ?Labs reviewed ? ?Hemovac: 0 ml  ?OG tube: per xray tip in distal stomach ? ? ?Diet Order:   ?Diet Order   ? ?       ?  Diet NPO time specified  Diet effective now       ?  ? ?  ?  ? ?  ? ? ?EDUCATION NEEDS:  ? ?Not appropriate for education at this time ? ?Skin:  Skin Assessment: Skin Integrity Issues: ?Skin  Integrity Issues:: Incisions ?Incisions: closed back ? ?Last BM:  Unknown ? ?Height:  ? ?Ht Readings from Last 1 Encounters:  ?07/12/2021 $RemoveBe'5\' 3"'OvasDjkQI$  (1.6 m)  ? ? ?Weight:  ? ?Wt Readings from Last 1 Encounters:  ?07/12/21 58.7 kg  ? ? ?Ideal Body Weight:  52.3 kg ? ?BMI:  Body mass index is 22.92 kg/m?. ? ?Estimated Nutritional Needs:  ? ?Kcal:  1600-1800 ? ?Protein:  85-100 grams ? ?Fluid:  >1.5 L/day ? ?Lockie Pares., RD, LDN, CNSC ?See AMiON for contact information  ? ?

## 2021-07-13 DIAGNOSIS — G062 Extradural and subdural abscess, unspecified: Secondary | ICD-10-CM | POA: Diagnosis not present

## 2021-07-13 LAB — CBC
HCT: 37.7 % (ref 36.0–46.0)
Hemoglobin: 11.7 g/dL — ABNORMAL LOW (ref 12.0–15.0)
MCH: 26.4 pg (ref 26.0–34.0)
MCHC: 31 g/dL (ref 30.0–36.0)
MCV: 84.9 fL (ref 80.0–100.0)
Platelets: 276 10*3/uL (ref 150–400)
RBC: 4.44 MIL/uL (ref 3.87–5.11)
RDW: 16.1 % — ABNORMAL HIGH (ref 11.5–15.5)
WBC: 16.5 10*3/uL — ABNORMAL HIGH (ref 4.0–10.5)
nRBC: 0 % (ref 0.0–0.2)

## 2021-07-13 LAB — BASIC METABOLIC PANEL
Anion gap: 7 (ref 5–15)
BUN: 15 mg/dL (ref 6–20)
CO2: 32 mmol/L (ref 22–32)
Calcium: 8.4 mg/dL — ABNORMAL LOW (ref 8.9–10.3)
Chloride: 100 mmol/L (ref 98–111)
Creatinine, Ser: 0.34 mg/dL — ABNORMAL LOW (ref 0.44–1.00)
GFR, Estimated: >60 mL/min (ref >60)
Glucose, Bld: 175 mg/dL — ABNORMAL HIGH (ref 70–99)
Potassium: 3.6 mmol/L (ref 3.5–5.1)
Sodium: 139 mmol/L (ref 135–145)

## 2021-07-13 LAB — GLUCOSE, CAPILLARY
Glucose-Capillary: 153 mg/dL — ABNORMAL HIGH (ref 70–99)
Glucose-Capillary: 168 mg/dL — ABNORMAL HIGH (ref 70–99)
Glucose-Capillary: 141 mg/dL — ABNORMAL HIGH (ref 70–99)
Glucose-Capillary: 126 mg/dL — ABNORMAL HIGH (ref 70–99)
Glucose-Capillary: 123 mg/dL — ABNORMAL HIGH (ref 70–99)
Glucose-Capillary: 145 mg/dL — ABNORMAL HIGH (ref 70–99)

## 2021-07-13 MED ORDER — MIDAZOLAM HCL 2 MG/2ML IJ SOLN
1.0000 mg | INTRAMUSCULAR | Status: DC | PRN
  Administered 2021-07-13: 1 mg via INTRAVENOUS
  Filled 2021-07-13: qty 2

## 2021-07-13 NOTE — Progress Notes (Signed)
Subjective: ?The patient is intubated and alert.  She is in no apparent distress.  Her son is at the bedside. ? ?Objective: ?Vital signs in last 24 hours: ?Temp:  [97.4 ?F (36.3 ?C)-102.9 ?F (39.4 ?C)] 98.1 ?F (36.7 ?C) (04/13 0800) ?Pulse Rate:  [59-91] 86 (04/13 1000) ?Resp:  [13-25] 22 (04/13 1000) ?BP: (99-177)/(60-92) 157/85 (04/13 1000) ?SpO2:  [88 %-100 %] 93 % (04/13 1000) ?FiO2 (%):  [50 %-70 %] 50 % (04/13 0800) ?Weight:  [58.7 kg] 58.7 kg (04/13 0334) ?Estimated body mass index is 22.92 kg/m? as calculated from the following: ?  Height as of this encounter: '5\' 3"'$  (1.6 m). ?  Weight as of this encounter: 58.7 kg. ? ? ?Intake/Output from previous day: ?04/12 0701 - 04/13 0700 ?In: 2151 [I.V.:969.1; NG/GT:881.8; IV Piggyback:300.1] ?Out: 2000 [Urine:2000] ?Intake/Output this shift: ?Total I/O ?In: 337.2 [I.V.:67.2; NG/GT:270] ?Out: -  ? ?Physical exam the patient remains triplegic.  She follows commands.  She moves her left arm. ? ?Lab Results: ?Recent Labs  ?  07/12/21 ?1937 07/13/21 ?9024  ?WBC 15.4* 16.5*  ?HGB 11.3* 11.7*  ?HCT 34.6* 37.7  ?PLT 249 276  ? ?BMET ?Recent Labs  ?  07/12/21 ?0973 07/13/21 ?5329  ?NA 137 139  ?K 4.1 3.6  ?CL 100 100  ?CO2 30 32  ?GLUCOSE 95 175*  ?BUN 11 15  ?CREATININE 0.45 0.34*  ?CALCIUM 8.4* 8.4*  ? ? ?Studies/Results: ?No results found. ? ?Assessment/Plan: ?Holospinal epidural abscess, triplegia: I discussed the situation with the patient and her son.  She is gravely ill.  I told her she has likely infarcted her thoracic spinal cord and I think her lower extremity paralysis is likely to be permanent.  It remains to be seen if she will gain any function in her right upper extremity.  We discussed the treatment options including continued IV antibiotics and supportive care versus withdrawal of support.  She wants to continue with supportive care and will likely need a trach.  I have answered all their questions. ? LOS: 6 days  ? ? ? ?Ophelia Charter ?07/13/2021, 10:50  AM ? ? ? ? ?Patient ID: Linda Cervantes, female   DOB: Aug 04, 1965, 56 y.o.   MRN: [id number] ? ?

## 2021-07-13 NOTE — Progress Notes (Signed)
Pt placed in PSV/CPAP by RT, pt tolerating well at this time. RN aware, RT will continue to monitor.  ?

## 2021-07-13 NOTE — Progress Notes (Signed)
? ?NAME:  Linda Cervantes, MRN:  [id number], DOB:  Feb 03, 1966, LOS: 6 ?ADMISSION DATE:  07/08/2021, CONSULTATION DATE:  07/20/2021 ?REFERRING MD:  Dr. Lonny Prude, CHIEF COMPLAINT:  Respiratory distress   ? ?History of Present Illness:  ?Linda Cervantes is a 56 y.o. female with a PMH significant for IV drug use, substance abuse, paranoid schizophrenia, depression, asthma, anxiety, and back pain who presented to [facility] ED for complaints of new onset mid to low back pain with associated difficulty urinating and possible lower extremity weakness. On admit she met criteria for sepsis and was admitted for further workup per TRH ? 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 Neurosurgery was called and request urgent transfer to [facility] for further imaging and possible need for surgical intervention.  ? ?On arrival to [facility] attempts were made to obtain MRI of thoracic spine but patient did no tolerate flat position and quick displayed signs of respiratory distress. MRI was canceled and PCCM was consulted for possible need of airway support.  ? ?Pertinent  Medical History  ?IV drug use, substance abuse, paranoid schizophrenia, depression, asthma, anxiety, and back pain ? ?Significant Hospital Events: ?Including procedures, antibiotic start and stop dates in addition to other pertinent events   ?4/6 admitted for acute back pain, inability to urinate and possible lower extremity weakness. Found to have large epidural abscess ?4/6 to OR for emergency cervical and thoracic laminectomy ?4/10 developed triplegia. Had MRI spine which showed cervical epidural abscess. Emergent OR trip for hemilaminectomy. ?4/12 family meeting with son and significant other at bedside.  ? ?Interim History / Subjective:  ?No overnight events. On PS trial this morning. Son at bedside ? ?Objective   ?Blood pressure 105/63, pulse 82, temperature 98.1 ?F (36.7 ?C),  temperature source Oral, resp. rate 15, height $RemoveBe'5\' 3"'qDETsOXwO$  (1.6 m), weight 58.7 kg, SpO2 91 %. ?   ?Vent Mode: PSV;CPAP ?FiO2 (%):  [50 %-100 %] 50 % ?Set Rate:  [15 bmp-16 bmp] 16 bmp ?Vt Set:  [420 mL] 420 mL ?PEEP:  [5 cmH20-10 cmH20] 8 cmH20 ?Pressure Support:  [10 cmH20] 10 cmH20 ?Plateau Pressure:  [19 cmH20-24 cmH20] 24 cmH20  ? ?Intake/Output Summary (Last 24 hours) at 07/13/2021 0835 ?Last data filed at 07/13/2021 0800 ?Gross per 24 hour  ?Intake 2167.83 ml  ?Output 1750 ml  ?Net 417.83 ml  ? ?Filed Weights  ? 07/16/2021 0341 07/12/21 0500 07/13/21 0334  ?Weight: 57.2 kg 58.7 kg 58.7 kg  ? ? ?Examination: ?Gen:      Intubated, sedated, acutely ill appearing ?HEENT:  ETT to vent ?Lungs:    sounds of mechanical ventilation auscultated no wheezes or crackles ?CV:         RRR no mrg ?Abd:      + bowel sounds; soft, non-tender; no palpable masses, no distension ?Ext:    No edema ?Skin:      Warm and dry; no rashes ?Neuro:   sedated, RASS -2. Able to move her LUE on command ? ? ?Resolved Hospital Problem list   ?Circulatory shock ?Hypokalemia ?hyponatremia ?Assessment & Plan:  ? ?Linda Cervantes is a 56 y.o. woman with history of tobacco and opioid dependence who presents with: ? 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  ?S/p emergent thoracic laminectomy 07/08/2021 and cervical laminectomy on 07/19/2021.  ?- Continue Vancomycin, appreciate ID involvement ?- TTE negative for vegetation ?-  repeat blood cultures 4/10 ngtd ? ?Acute metabolic encephalopathy  ?IV drug abuse, polysubstance abuse  ?Likely secondary to infection with underlying psych disorder ?UDS positive for cocaine ?P: ?Neurosurgery consulted and following  ?ID following  ?Maintain neuro protective measures; goal for eurothermia, euglycemia, eunatermia, normoxia, and PCO2 goal of 35-40 ?Cessation education when able  ?EKG yesterday  showed QtC 277. continue methadone, half dose prozac and zyprexa. Repeat EKG again today.  ? ?Acute  hypoxemic respiratory failure ?COPD ?P: ?Continue prn nebs ?Coming down on ventilator settings. Wean PEEP and FiO2 for sats greater than 90%. ?support with lung protective strategies  ?Head of bed elevated 30 degrees. ?Plateau pressures less than 30 cm H20.  ?Follow intermittent chest x-ray and ABG.   ?Ensure adequate pulmonary hygiene  ?VAP bundle in place  ?PAD protocol ?- there is concern for diaphragmatic involvement given the extent of the cervical spine infection and her triplegia.  ?- she does breath spontaneously on PS trials and follow commands but is not appropriate for extubation given concern for NM weakness/fatigue.  ? ?Acute urinary retention ?P: ?Started retention meds already. Trial of foley discontinued ?At risk for neurogenic bladder ? ?Goals of Care ?Discussed with son Riki Rusk and significant other Clifton Custard. Riki Rusk says that other family members who were previously not involved in her life are coming to see her and would now like to be involved with decision making. They will be coming by in the next couple of days. He is aware that the options are either trach/peg with LTACH for attempt with ventilator weaning. Discussed coming to a decision by the end of the weekend.  ? ?Best Practice (right click and "Reselect all SmartList Selections" daily)  ? ?Diet/type: NPO with tube feeds. May need PEG tube.  ?DVT prophylaxis: SCD ?GI prophylaxis: PPI ?Lines: [name] ?Foley:  Yes, and it is no longer needed and removal ordered  ?Code Status:  full code ?Last date of multidisciplinary goals of care discussion: discussed at bedside with son and her significant other on 07/11/21.  ? ?Labs   ?CBC: ?Recent Labs  ?Lab 07/16/2021 ?1928 07/05/2021 ?0500 07/12/2021 ?0021 07/09/21 ?0441 07/01/2021 ?1492 07/21/2021 ?1738 07/16/2021 ?2125 07/11/21 ?0601 07/12/21 ?9882 07/13/21 ?1365  ?WBC 20.6* 18.6*   < > 15.4* 12.8*  --   --  13.0* 15.4* 16.5*  ?NEUTROABS 17.2* 15.3*  --   --   --   --   --   --   --   --   ?HGB 13.7 11.5*   < > 10.2*  9.8* 7.1* 9.8* 11.3* 11.3* 11.7*  ?HCT 40.4 34.9*   < > 32.5* 31.0* 21.0* 31.8* 35.3* 34.6* 37.7  ?MCV 78.3* 78.6*   < > 83.1 82.7  --   --  82.7 82.4 84.9  ?PLT 235 214   < > 260 239  --   --  234 249 276  ? < > = values in this interval not displayed.  ? ? ?Basic Metabolic Panel: ?Recent Labs  ?Lab 07/28/2021 ?0500 07/01/2021 ?0021 07/16/2021 ?0254 07/07/2021 ?3664 07/14/2021 ?1648 07/09/21 ?0441 07/09/21 ?1725 07/13/2021 ?0340 07/01/2021 ?1738 07/11/21 ?0601 07/12/21 ?0177 07/13/21 ?6528  ?NA 129*   < > 134*   < >  --  136  --  140 148* 140 137 139  ?K 3.4*   < > 3.5   < >  --  3.3*  --  4.3 3.2* 4.1 4.1 3.6  ?CL 94*  --  102  --   --  104  --  108  --  104 100 100  ?CO2 24  --  25  --   --  26  --  27  --  29 30 32  ?GLUCOSE 118*  --  110*  --   --  239*  --  169*  --  99 95 175*  ?BUN 15  --  11  --   --  24*  --  16  --  $R'12 11 15  'wP$ ?CREATININE 0.62  --  0.55  --   --  0.63  --  0.55  --  0.37* 0.45 0.34*  ?CALCIUM 9.0  --  8.4*  --   --  8.4*  --  8.5*  --  8.3* 8.4* 8.4*  ?MG 1.7  --  1.7  --  2.6* 2.6* 2.2  --   --   --   --   --   ?PHOS  --   --   --   --  3.6 2.3* 2.3*  --   --  3.1  --   --   ? < > = values in this interval not displayed.  ? ?GFR: ?Estimated Creatinine Clearance: 67.3 mL/min (A) (by C-G formula based on SCr of 0.34 mg/dL (L)). ?Recent Labs  ?Lab 07/19/2021 ?1945 07/27/2021 ?0016 07/01/2021 ?0500 07/09/2021 ?0340 07/11/21 ?0601 07/12/21 ?1595 07/13/21 ?3967  ?WBC  --   --    < > 12.8* 13.0* 15.4* 16.5*  ?LATICACIDVEN 1.9 1.1  --   --   --   --   --   ? < > = values in this interval not displayed.  ? ? ?Liver Function Tests: ?Recent Labs  ?Lab 07/22/2021 ?1928 07/09/2021 ?0500 07/15/2021 ?0254  ?AST 53* 29 22  ?ALT 53* 37 27  ?ALKPHOS 85 69 57  ?BILITOT 1.1 0.7 0.9  ?PROT 9.0* 7.4 6.4*  ?ALBUMIN 4.1 3.2* 2.1*  ? ?Recent Labs  ?Lab 07/16/2021 ?1928  ?LIPASE 27  ? ?No results for input(s): AMMONIA in the last 168 hours. ? ?ABG ?   ?Component Value Date/Time  ? PHART 7.397 07/13/2021 1738  ? PCO2ART 33.4 07/24/2021 1738  ?  PO2ART 125 (H) 07/16/2021 1738  ? HCO3 20.5 07/19/2021 1738  ? TCO2 22 07/08/2021 1738  ? ACIDBASEDEF 4.0 (H) 07/23/2021 1738  ? O2SAT 99 07/27/2021 1738  ?  ? ?Coagulation Profile: ?Recent Labs  ?Lab

## 2021-07-13 NOTE — Progress Notes (Signed)
CSW continuing to follow for discharge needs. Of note, per [facility], they would not accept patient's insurance. Per [facility], they would be able to try for authorization if patient met their program criteria. CSW will follow up once medically appropriate.  ? ?Cedric Fishman ?LCSW, MSW, MHA ? ?

## 2021-07-13 NOTE — Progress Notes (Signed)
Called to patient's bedside for respiratory distress. Patient breathing 30-40 times/minute, gasping "guppy" breathing. Recommend putting back on mandated ventilation, increasing fentanyl gtt. Have also added prn versed to help with sedation and comfort.  ? ?Also noted that patient's son declined consent for TEE for MRSA endocarditis evaluation - will need to re-evaluate.  ? ?Additional CC time 20 minutes.  ?

## 2021-07-13 NOTE — Progress Notes (Signed)
? ? ?  Linda Cervantes has been requested to perform a transesophageal echocardiogram on Linda Cervantes for MRSA bacteremia. Per chart review, patient has a history of IVDA (las injected cocaine, day prior to admission). Patient was admitted to [facility] on 07/05/2021 with sepsis due to epidural abscess. Patient underwent laminotomy for evacuation of a thoracic and lumbar epidural abscess on 07/06/2021. Blood cultures grew MRSA. After the surgery, patient was very ill and was unable to move her right upper extremity or her bilateral lower extremities. Found to have a posterior cervical epidural abscess. Underwent left C1-T1 hemilaminectomy for drainage of cervical epidural abscess on 4/10. Remains triplegic, suspicious that patient infarcted her thoracic spinal cord. Infectious disease has been following due to MRSA bacteremia and they recommended TEE to rule out endocarditis.  ? ?I discussed the procedure, indications, benefits, and risks of procedure with patient's son, Linda Cervantes. All questions were answered. At this time, he does not consent to TEE for his mother. He did report that he would be open to discussing the procedure at a later time, but he feels his mother needs time to recover from previous surgeries.  ? ?Margie Billet, PA-C ?07/13/2021 2:24 PM  ? ?

## 2021-07-13 NOTE — Addendum Note (Signed)
Addendum  created 07/13/21 0816 by Josephine Igo, CRNA  ? Attached Procedures edited  ?  ?

## 2021-07-13 NOTE — Progress Notes (Signed)
Pt placed back on full vent support due to tachypnea and increased WOB. Pt tolerating well at this time,  RN aware, RT will continue to monitor  ?

## 2021-07-14 ENCOUNTER — Ambulatory Visit (HOSPITAL_COMMUNITY): Payer: 59 | Admitting: Licensed Clinical Social Worker

## 2021-07-14 ENCOUNTER — Telehealth (HOSPITAL_COMMUNITY): Payer: Self-pay | Admitting: Licensed Clinical Social Worker

## 2021-07-14 ENCOUNTER — Encounter (HOSPITAL_COMMUNITY): Payer: Self-pay

## 2021-07-14 DIAGNOSIS — A419 Sepsis, unspecified organism: Secondary | ICD-10-CM | POA: Diagnosis not present

## 2021-07-14 DIAGNOSIS — R6521 Severe sepsis with septic shock: Secondary | ICD-10-CM

## 2021-07-14 DIAGNOSIS — G9340 Encephalopathy, unspecified: Secondary | ICD-10-CM | POA: Diagnosis not present

## 2021-07-14 DIAGNOSIS — G062 Extradural and subdural abscess, unspecified: Secondary | ICD-10-CM | POA: Diagnosis not present

## 2021-07-14 LAB — CBC WITH DIFFERENTIAL/PLATELET
Abs Immature Granulocytes: 0.82 10*3/uL — ABNORMAL HIGH (ref 0.00–0.07)
Basophils Absolute: 0.1 10*3/uL (ref 0.0–0.1)
Basophils Relative: 1 %
Eosinophils Absolute: 0.1 10*3/uL (ref 0.0–0.5)
Eosinophils Relative: 1 %
HCT: 35.3 % — ABNORMAL LOW (ref 36.0–46.0)
Hemoglobin: 10.5 g/dL — ABNORMAL LOW (ref 12.0–15.0)
Immature Granulocytes: 6 %
Lymphocytes Relative: 11 %
Lymphs Abs: 1.6 10*3/uL (ref 0.7–4.0)
MCH: 25.9 pg — ABNORMAL LOW (ref 26.0–34.0)
MCHC: 29.7 g/dL — ABNORMAL LOW (ref 30.0–36.0)
MCV: 87.2 fL (ref 80.0–100.0)
Monocytes Absolute: 1.4 10*3/uL — ABNORMAL HIGH (ref 0.1–1.0)
Monocytes Relative: 9 %
Neutro Abs: 10.8 10*3/uL — ABNORMAL HIGH (ref 1.7–7.7)
Neutrophils Relative %: 72 %
Platelets: 228 10*3/uL (ref 150–400)
RBC: 4.05 MIL/uL (ref 3.87–5.11)
RDW: 16.3 % — ABNORMAL HIGH (ref 11.5–15.5)
WBC: 14.8 10*3/uL — ABNORMAL HIGH (ref 4.0–10.5)
nRBC: 0 % (ref 0.0–0.2)

## 2021-07-14 LAB — COMPREHENSIVE METABOLIC PANEL
ALT: 27 U/L (ref 0–44)
AST: 34 U/L (ref 15–41)
Albumin: 1.6 g/dL — ABNORMAL LOW (ref 3.5–5.0)
Alkaline Phosphatase: 56 U/L (ref 38–126)
Anion gap: 5 (ref 5–15)
BUN: 13 mg/dL (ref 6–20)
CO2: 34 mmol/L — ABNORMAL HIGH (ref 22–32)
Calcium: 8.1 mg/dL — ABNORMAL LOW (ref 8.9–10.3)
Chloride: 98 mmol/L (ref 98–111)
Creatinine, Ser: 0.39 mg/dL — ABNORMAL LOW (ref 0.44–1.00)
GFR, Estimated: >60 mL/min (ref >60)
Glucose, Bld: 148 mg/dL — ABNORMAL HIGH (ref 70–99)
Potassium: 3.8 mmol/L (ref 3.5–5.1)
Sodium: 137 mmol/L (ref 135–145)
Total Bilirubin: 0.3 mg/dL (ref 0.3–1.2)
Total Protein: 6 g/dL — ABNORMAL LOW (ref 6.5–8.1)

## 2021-07-14 LAB — CULTURE, BLOOD (ROUTINE X 2): Culture: NO GROWTH

## 2021-07-14 LAB — GLUCOSE, CAPILLARY
Glucose-Capillary: 131 mg/dL — ABNORMAL HIGH (ref 70–99)
Glucose-Capillary: 150 mg/dL — ABNORMAL HIGH (ref 70–99)
Glucose-Capillary: 108 mg/dL — ABNORMAL HIGH (ref 70–99)

## 2021-07-14 MED ORDER — LORAZEPAM 2 MG/ML IJ SOLN
INTRAMUSCULAR | Status: AC
  Administered 2021-07-14: 2 mg
  Filled 2021-07-14: qty 2

## 2021-07-14 MED ORDER — HALOPERIDOL LACTATE 2 MG/ML PO CONC
0.5000 mg | ORAL | Status: DC | PRN
  Administered 2021-07-14: 0.5 mg via SUBLINGUAL
  Filled 2021-07-14 (×2): qty 0.3

## 2021-07-14 MED ORDER — LORAZEPAM 2 MG/ML IJ SOLN
2.0000 mg | INTRAMUSCULAR | Status: DC | PRN
  Administered 2021-07-15 – 2021-07-16 (×5): 2 mg via INTRAVENOUS
  Filled 2021-07-14 (×8): qty 1

## 2021-07-14 MED ORDER — HALOPERIDOL 0.5 MG PO TABS
0.5000 mg | ORAL_TABLET | ORAL | Status: DC | PRN
  Filled 2021-07-14: qty 1

## 2021-07-14 MED ORDER — FENTANYL CITRATE PF 50 MCG/ML IJ SOSY
25.0000 ug | PREFILLED_SYRINGE | Freq: Once | INTRAMUSCULAR | Status: AC
  Administered 2021-07-14: 25 ug via INTRAVENOUS

## 2021-07-14 MED ORDER — BIOTENE DRY MOUTH MT LIQD: 15.0000 mL | OROMUCOSAL | Status: DC | PRN

## 2021-07-14 MED ORDER — FUROSEMIDE 10 MG/ML IJ SOLN
80.0000 mg | Freq: Once | INTRAMUSCULAR | Status: AC
  Administered 2021-07-14: 80 mg via INTRAVENOUS
  Filled 2021-07-14: qty 8

## 2021-07-14 MED ORDER — POLYVINYL ALCOHOL 1.4 % OP SOLN: 1.0000 [drp] | Freq: Four times a day (QID) | OPHTHALMIC | Status: DC | PRN

## 2021-07-14 MED ORDER — GLYCOPYRROLATE 1 MG PO TABS
1.0000 mg | ORAL_TABLET | ORAL | Status: DC | PRN
  Filled 2021-07-14: qty 1

## 2021-07-14 MED ORDER — GLYCOPYRROLATE 0.2 MG/ML IJ SOLN
0.2000 mg | INTRAMUSCULAR | Status: DC | PRN
Start: 2021-07-14 — End: 2021-07-17
  Administered 2021-07-14 – 2021-07-16 (×6): 0.2 mg via INTRAVENOUS
  Filled 2021-07-14 (×6): qty 1

## 2021-07-14 MED ORDER — GLYCOPYRROLATE 0.2 MG/ML IJ SOLN: 0.2000 mg | INTRAMUSCULAR | Status: DC | PRN

## 2021-07-14 MED ORDER — VANCOMYCIN HCL 750 MG/150ML IV SOLN
750.0000 mg | Freq: Two times a day (BID) | INTRAVENOUS | Status: DC
  Administered 2021-07-14: 750 mg via INTRAVENOUS
  Filled 2021-07-14 (×2): qty 150

## 2021-07-14 MED ORDER — FENTANYL CITRATE PF 50 MCG/ML IJ SOSY
25.0000 ug | PREFILLED_SYRINGE | INTRAMUSCULAR | Status: DC | PRN
  Administered 2021-07-14: 25 ug via INTRAVENOUS
  Filled 2021-07-14: qty 1

## 2021-07-14 MED ORDER — MORPHINE BOLUS VIA INFUSION
2.0000 mg | INTRAVENOUS | Status: DC | PRN
  Administered 2021-07-14 – 2021-07-15 (×5): 2 mg via INTRAVENOUS
  Filled 2021-07-14: qty 2

## 2021-07-14 MED ORDER — MORPHINE 100MG IN NS 100ML (1MG/ML) PREMIX INFUSION
1.0000 mg/h | INTRAVENOUS | Status: DC
  Administered 2021-07-14: 2 mg/h via INTRAVENOUS
  Administered 2021-07-15: 5 mg/h via INTRAVENOUS
  Administered 2021-07-15 – 2021-07-17 (×9): 20 mg/h via INTRAVENOUS
  Filled 2021-07-14 (×11): qty 100

## 2021-07-14 MED ORDER — FENTANYL CITRATE PF 50 MCG/ML IJ SOSY
PREFILLED_SYRINGE | INTRAMUSCULAR | Status: AC
  Filled 2021-07-14: qty 1

## 2021-07-14 MED ORDER — HALOPERIDOL LACTATE 5 MG/ML IJ SOLN
0.5000 mg | INTRAMUSCULAR | Status: DC | PRN
  Administered 2021-07-14 – 2021-07-15 (×2): 0.5 mg via INTRAVENOUS
  Filled 2021-07-14 (×2): qty 1

## 2021-07-14 NOTE — Plan of Care (Signed)
Postextubation patient was placed on nasal cannula oxygen, she started desatting, she was placed on Ventimask with continuous desaturation, she was uncomfortable and agitated, she was given 25 mics of fentanyl with some improvement in agitation, patient's family was at bedside including son and 2 daughters and her fianc?, they all agreed to proceed with comfort care and no escalation of care. ?Comfort care orders were placed, will start on morphine infusion ? ?Patient's family was satisfied with the plan of action and management.  All questions were answered ? ?

## 2021-07-14 NOTE — Progress Notes (Signed)
[facility] referral complete. Referral number [id number] with [facility]. Family at bedside. Plan was for family meeting on Sunday. Son now requesting one way extubation now.  ?

## 2021-07-14 NOTE — Telephone Encounter (Signed)
LCSW sent three links to pt phone with no response. LCSW call with no answer. Pt will need to call and reschedule for future appointment  ?

## 2021-07-14 NOTE — Progress Notes (Addendum)
? ?NAME:  Linda Cervantes, MRN:  [id number], DOB:  06-22-1965, LOS: 7 ?ADMISSION DATE:  07/12/2021, CONSULTATION DATE:  07/03/2021 ?REFERRING MD:  Dr. Lonny Prude, CHIEF COMPLAINT:  Respiratory distress   ? ?History of Present Illness:  ?Linda Cervantes is a 56 y.o. female with a PMH significant for IV drug use, substance abuse, paranoid schizophrenia, depression, asthma, anxiety, and back pain who presented to [facility] ED for complaints of new onset mid to low back pain with associated difficulty urinating and possible lower extremity weakness. On admit she met criteria for sepsis and was admitted for further workup per [facility] ? ?MRI spine on admission revealed epidural abscess extending from T12-L2 measuring 30mm at the thickest with severe spinal cord stenosis at L3-L4 and L4-L5. Neurosurgery was called and request urgent transfer to [facility] for further imaging and possible need for surgical intervention.  ? ?On arrival to [facility] attempts were made to obtain MRI of thoracic spine but patient did no tolerate flat position and quick displayed signs of respiratory distress. MRI was canceled and PCCM was consulted for possible need of airway support.  ? ?Pertinent  Medical History  ?IV drug use, substance abuse, paranoid schizophrenia, depression, asthma, anxiety, and back pain ? ?Significant Hospital Events: ?Including procedures, antibiotic start and stop dates in addition to other pertinent events   ?4/6 admitted for acute back pain, inability to urinate and possible lower extremity weakness. Found to have large epidural abscess ?4/6 to OR for emergency cervical and thoracic laminectomy ?4/10 developed triplegia. Had MRI spine which showed cervical epidural abscess. Emergent OR trip for hemilaminectomy. ?4/12 family meeting with son and significant other at bedside.  ? ?Interim History / Subjective:  ?Patient became hypoxic, FiO2 was increased to 60%, with PEEP of 8 ?Continued to spike fever ?Patient's son refused to have TEE  done ? ?Objective   ?Blood pressure (!) 157/82, pulse 95, temperature 97.6 ?F (36.4 ?C), temperature source Axillary, resp. rate (!) 28, height $RemoveBe'5\' 3"'qDpCrCNFN$  (1.6 m), weight 58.7 kg, SpO2 (!) 83 %. ?   ?Vent Mode: PSV;CPAP ?FiO2 (%):  [40 %-55 %] 55 % ?Set Rate:  [16 bmp] 16 bmp ?Vt Set:  [420 mL] 420 mL ?PEEP:  [5 cmH20-8 cmH20] 5 cmH20 ?Pressure Support:  [10 cmH20] 10 cmH20 ?Plateau Pressure:  [19 cmH20-24 cmH20] 19 cmH20  ? ?Intake/Output Summary (Last 24 hours) at 07/14/2021 1303 ?Last data filed at 07/14/2021 1123 ?Gross per 24 hour  ?Intake 1628.95 ml  ?Output 1750 ml  ?Net -121.05 ml  ? ?Filed Weights  ? 07/15/2021 0341 07/12/21 0500 07/13/21 0334  ?Weight: 57.2 kg 58.7 kg 58.7 kg  ? ? ?  ?Physical exam: ?General: Crtitically ill-appearing female, orally intubated ?HEENT: [place]/AT, eyes anicteric.  ETT and OGT in place ?Neuro: Opens eyes with vocal stimuli, intermittently following commands, left upper extremity is antigravity, no motor movement in all other extremities ?Chest: Coarse breath sounds, no wheezes or rhonchi ?Heart: Regular rate and rhythm, no murmurs or gallops ?Abdomen: Soft, nontender, nondistended, bowel sounds present ?Skin: No rash ? 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  ?S/p emergent thoracic laminectomy 07/12/2021 and cervical laminectomy on 07/30/2021.  ?Continue Vancomycin, appreciate ID involvement ?Repeat blood cultures have been negative ?TTE is negative for vegetation ?Patient's family refused TEE for now ?Appreciate neurosurgery input ? ?Acute septic/toxic  encephalopathy  ?IV drug abuse, polysubstance abuse  ?Likely secondary to infection with underlying psych disorder and polysubstance abuse ?Minimize sedation with RASS goal 0/-1 ?Continue treatment of sepsis ?Watch for signs of withdrawal ?Continue  home dose methadone, half dose prozac and zyprexa.  ? ?Acute hypoxemic respiratory failure ?COPD, not in exacerbation ?Continue prn nebs ?Continue lung protective ventilation, titrate FiO2 with O2 sat goal 92% ?VAP bundle in place  ?PAD protocol ? ?Acute urinary retention ?Continue Urecholine ?Trial of foley discontinued ?At risk for neurogenic bladder ? ? ?Best Practice (right click and "Reselect all SmartList Selections" daily)  ? ?Diet/type: NPO with tube feeds.  ?DVT prophylaxis: SCD ?GI prophylaxis: PPI ?Lines: [name] ?Foley: Yes needed ?Code Status:  DNR ?Last date of multidisciplinary goals of care discussion: discussed at bedside with son, daughters and her significant other on 07/14/21.  Please see separate plan of care note ? ?Labs   ?CBC: ?Recent Labs  ?Lab 07/27/2021 ?2876 07/01/2021 ?1738 07/29/2021 ?2125 07/11/21 ?0601 07/12/21 ?8115 07/13/21 ?7262 07/14/21 ?0612  ?WBC 12.8*  --   --  13.0* 15.4* 16.5* 14.8*  ?NEUTROABS  --   --   --   --   --   --  10.8*  ?HGB 9.8*   < > 9.8* 11.3* 11.3* 11.7* 10.5*  ?HCT 31.0*   < > 31.8* 35.3* 34.6* 37.7 35.3*  ?MCV 82.7  --   --  82.7 82.4 84.9 87.2  ?PLT 239  --   --  234 249 276 228  ? < > = values in this interval not displayed.  ? ? ?Basic Metabolic Panel: ?Recent Labs  ?Lab 07/06/2021 ?0254 07/12/2021 ?0355 07/30/2021 ?1648 07/09/21 ?0441 07/09/21 ?1725 07/13/2021 ?0340 07/18/2021 ?1738 07/11/21 ?0601 07/12/21 ?9741 07/13/21 ?6384 07/14/21 ?0612  ?NA 134*   < >  --  136  --  140 148* 140 137 139 137  ?K 3.5   < >  --  3.3*  --  4.3 3.2* 4.1 4.1 3.6 3.8  ?CL 102  --   --  104  --  108  --  104 100 100 98  ?CO2 25  --   --  26  --  27  --  29 30 32 34*  ?GLUCOSE 110*  --   --  239*  --  169*  --  99 95 175* 148*  ?BUN 11  --   --  24*  --  16  --  $R'12 11 15 13  'bP$ ?CREATININE 0.55  --   --  0.63  --  0.55  --  0.37* 0.45 0.34* 0.39*  ?CALCIUM 8.4*  --   --  8.4*  --  8.5*  --  8.3* 8.4* 8.4* 8.1*  ?MG 1.7  --  2.6* 2.6* 2.2  --   --   --   --   --   --   ?PHOS  --   --  3.6 2.3* 2.3*   --   --  3.1  --   --   --   ? < > = values in this interval not displayed.  ? ?GFR: ?Estimated Creatinine Clearance: 67.3 mL/min (A) (by C-G formula based on SCr of 0.39 mg/dL (L)). ?Recent Labs  ?Lab 07/11/21 ?0601 07/12/21 ?5364 07/13/21 ?6803 07/14/21 ?0612  ?WBC 13.0* 15.4* 16.5* 14.8*  ? ? ?Liver Function Tests: ?Recent Labs  ?Lab 07/05/2021 ?0254 07/14/21 ?0612  ?AST 22 34  ?ALT 27 27  ?ALKPHOS 57 56  ?BILITOT  0.9 0.3  ?PROT 6.4* 6.0*  ?ALBUMIN 2.1* 1.6*  ? ?No results for input(s): LIPASE, AMYLASE in the last 168 hours. ? ?No results for input(s): AMMONIA in the last 168 hours. ? ?ABG ?   ?Component Value Date/Time  ? PHART 7.397 07/29/2021 1738  ? PCO2ART 33.4 07/24/2021 1738  ? PO2ART 125 (H) 07/30/2021 1738  ? HCO3 20.5 07/27/2021 1738  ? TCO2 22 07/22/2021 1738  ? ACIDBASEDEF 4.0 (H) 07/03/2021 1738  ? O2SAT 99 07/16/2021 1738  ?  ? ?Coagulation Profile: ?No results for input(s): INR, PROTIME in the last 168 hours. ? ? ?Critical care time:   ? ?Total critical care time: 57 minutes ? ?Performed by: Jacky Kindle ?  ?Critical care time was exclusive of separately billable procedures and treating other patients. ?  ?Critical care was necessary to treat or prevent imminent or life-threatening deterioration. ?  ?Critical care was time spent personally by me on the following activities: development of treatment plan with patient and/or surrogate as well as nursing, discussions with consultants, evaluation of patient's response to treatment, examination of patient, obtaining history from patient or surrogate, ordering and performing treatments and interventions, ordering and review of laboratory studies, ordering and review of radiographic studies, pulse oximetry and re-evaluation of patient's condition. ?  ?Jacky Kindle MD ?[facility] ?See Amion for pager ?If no response to pager, please call [phone] until 7pm ?After 7pm, Please call E-link [phone] ? ? ? ? ?

## 2021-07-14 NOTE — Progress Notes (Signed)
Pt placed in PSV/CPAP by RT, pt tolerating well. RN aware, RT will continue to monitor.  ?

## 2021-07-14 NOTE — Progress Notes (Signed)
eLink Physician-Brief Progress Note ?Patient Name: Linda Cervantes ?DOB: 1965-12-07 ?MRN: [id number] ? ? ?Date of Service ? 07/14/2021  ?HPI/Events of Note ? Pt is full comfort care but son asking about restarting abx. I dont think family understands what comfort care is. May you speak with them? ? ?Per Dr. Tacy Learn "Son and 2 daughters and her fianc?, they all agreed to proceed with comfort care and no escalation of care." ? ?Sepsis ([place]) ?  Acute encephalopathy ?  Hypokalemia ?  Acute hyponatremia ?  Polysubstance abuse ([place]) ?  Microcytic anemia ?  Acute urinary retention ?  Leukocytosis ?  Abscess in epidural space of thoracic spine ?  Abscess in epidural space of cervical spine ?                                                 ?   ID recommendation from earlier was Vanc q12hrs, last dose in AM.  ?                                  ?eICU Interventions ? Discussed with Ysidro Evert and updated bed side RN same. ? ?He is POA. ?He wants to keep comfort care( morphine gtt for pain, DNR and DNI), but want antibiotics to continue.  ? ?Ordered back Vanco 750 q12 hr. ? ?Bed side MD to discuss in AM further.   ? ? ? ?Intervention Category ?Minor Interventions: Communication with other healthcare providers and/or family ? ?Elmer Sow ?07/14/2021, 9:40 PM ?

## 2021-07-14 NOTE — Progress Notes (Signed)
?   07/14/21 1950  ?Clinical Encounter Type  ?Visited With Patient;Family  ?Visit Type Initial;Spiritual support  ?Referral From Nurse  ?Consult/Referral To Chaplain  ? ?Chaplain responded to a call from nurse, family requested prayer as patient is moved to comfort care.  ?Invited family to share stories of patient and how she was a part of their life. Allowed time for reflection and then we prayed.  ?Family members were appreciative of the opportunity to share and pray.  ? ?Patient moving to 6N ? ?Danice Goltz  ?Chaplain  ?[facility]  ?[phone] ?

## 2021-07-14 NOTE — Progress Notes (Signed)
Patient ID: Linda Cervantes, female   DOB: 09/24/1965, 56 y.o.   MRN: [id number] ?BP 139/74 (BP Location: Right Arm)   Pulse (!) 104   Temp 99.1 ?F (37.3 ?C) (Oral)   Resp (!) 22   Ht '5\' 3"'$  (1.6 m)   Wt 58.7 kg   LMP  (LMP Unknown) Comment: approx 2 years   SpO2 (!) 67%   BMI 22.92 kg/m?  ?Continuing comfort care ?No new recommendations.  ?

## 2021-07-14 NOTE — Progress Notes (Signed)
eLink Physician-Brief Progress Note ?Patient Name: Linda Cervantes ?DOB: 02-22-1966 ?MRN: [id number] ? ? ?Date of Service ? 07/14/2021  ?HPI/Events of Note ? Received request for restraints ?Patient seen intubated and a risk for self harm by pulling lines and tubes   ?eICU Interventions ? Bilateral soft wrist restraints ordered ?Bedside team to assess in am if restraints to be continued   ? ? ? ?Intervention Category ?Minor Interventions: Agitation / anxiety - evaluation and management ? ?Judd Lien ?07/14/2021, 5:52 AM ?

## 2021-07-14 NOTE — Progress Notes (Signed)
Pt's son told this nurse and to the charge nurse that he wants his mom on antibiotic. He stated that the MD did not explain to them that his mom will no longer receive antibiotic when she transition to comfort care. Dr. Christella Noa was called and he talked to the son and ordered Vancomycin. ?

## 2021-07-14 NOTE — Progress Notes (Signed)
Pt extubated to comfort and placed on 14L 55% venturi mask per CCM. MD at bedside, RN at bedside.  ?

## 2021-07-14 NOTE — Plan of Care (Signed)
Interdisciplinary Goals of Care Family Meeting ? ? ?Date carried out:: 07/14/2021 ? ?Location of the meeting: Bedside ? ?Member's involved: Physician, Bedside Registered Nurse, and Family Member or next of kin ? ?Durable Power of Attorney or acting medical decision maker: Linda Cervantes   ? ?Discussion: We discussed goals of care for Linda Cervantes .   ? ?The Clinical status was relayed to patient's son at bedside in detail. ?  ?Updated and notified of patients medical condition. ?  ? ?Patient is having a weak cough and struggling to remove secretions.   ?Patient with increased WOB and using accessory muscles to breathe ?Explained to family course of therapy and the modalities  ?Signs of neurological injury ?Evidence of extensive infection ?  ?Patient with a very high probablity of a very minimal chance of meaningful recovery despite all aggressive and optimal medical therapy. ? ?Code status: Full DNR ? ?Disposition: Continue current acute care, he would like [place] to do one-way extubation, if patient fails we would proceed with full comfort care ? ?  ?Family are satisfied with Plan of action and management. All questions answered ?  ?Jacky Kindle MD ?[facility] ?See Amion for pager ?If no response to pager, please call [phone] until 7pm ?After 7pm, Please call E-link [phone] ? ?

## 2021-07-15 DIAGNOSIS — Z7189 Other specified counseling: Secondary | ICD-10-CM

## 2021-07-15 DIAGNOSIS — Z66 Do not resuscitate: Secondary | ICD-10-CM

## 2021-07-15 DIAGNOSIS — F419 Anxiety disorder, unspecified: Secondary | ICD-10-CM

## 2021-07-15 DIAGNOSIS — G062 Extradural and subdural abscess, unspecified: Secondary | ICD-10-CM | POA: Diagnosis not present

## 2021-07-15 DIAGNOSIS — R0602 Shortness of breath: Secondary | ICD-10-CM

## 2021-07-15 DIAGNOSIS — M549 Dorsalgia, unspecified: Secondary | ICD-10-CM

## 2021-07-15 DIAGNOSIS — G061 Intraspinal abscess and granuloma: Secondary | ICD-10-CM | POA: Diagnosis not present

## 2021-07-15 DIAGNOSIS — Z515 Encounter for palliative care: Secondary | ICD-10-CM

## 2021-07-15 DIAGNOSIS — R6889 Other general symptoms and signs: Secondary | ICD-10-CM

## 2021-07-15 LAB — CULTURE, BLOOD (ROUTINE X 2)
Culture: NO GROWTH
Culture: NO GROWTH
Special Requests: ADEQUATE

## 2021-07-15 LAB — AEROBIC/ANAEROBIC CULTURE W GRAM STAIN (SURGICAL/DEEP WOUND)

## 2021-07-15 MED ORDER — VITAMINS A & D EX OINT
TOPICAL_OINTMENT | CUTANEOUS | Status: DC | PRN
  Filled 2021-07-15: qty 56.7

## 2021-07-15 MED ORDER — MORPHINE BOLUS VIA INFUSION
5.0000 mg | INTRAVENOUS | Status: DC | PRN
  Administered 2021-07-15: 7 mg via INTRAVENOUS
  Administered 2021-07-15 – 2021-07-16 (×4): 5 mg via INTRAVENOUS
  Filled 2021-07-15: qty 10

## 2021-07-15 MED ORDER — HALOPERIDOL LACTATE 5 MG/ML IJ SOLN
2.0000 mg | INTRAMUSCULAR | Status: DC | PRN
  Administered 2021-07-15: 2 mg via INTRAVENOUS
  Filled 2021-07-15: qty 1

## 2021-07-15 MED ORDER — HALOPERIDOL 1 MG PO TABS
2.0000 mg | ORAL_TABLET | ORAL | Status: DC | PRN
  Filled 2021-07-15: qty 2

## 2021-07-15 MED ORDER — SALINE SPRAY 0.65 % NA SOLN
1.0000 | NASAL | Status: DC | PRN
  Filled 2021-07-15: qty 44

## 2021-07-15 MED ORDER — MORPHINE BOLUS VIA INFUSION
5.0000 mg | INTRAVENOUS | Status: DC | PRN
  Administered 2021-07-15 (×4): 5 mg via INTRAVENOUS
  Filled 2021-07-15: qty 5

## 2021-07-15 MED ORDER — HALOPERIDOL LACTATE 2 MG/ML PO CONC
2.0000 mg | ORAL | Status: DC | PRN
Start: 2021-07-15 — End: 2021-07-17
  Filled 2021-07-15: qty 1

## 2021-07-15 MED ORDER — SCOPOLAMINE 1 MG/3DAYS TD PT72
1.0000 | MEDICATED_PATCH | TRANSDERMAL | Status: DC
Start: 2021-07-15 — End: 2021-07-17
  Administered 2021-07-15: 1.5 mg via TRANSDERMAL
  Filled 2021-07-15: qty 1

## 2021-07-15 MED ORDER — VANCOMYCIN HCL 1250 MG/250ML IV SOLN
1250.0000 mg | INTRAVENOUS | Status: DC
  Administered 2021-07-15 – 2021-07-16 (×2): 1250 mg via INTRAVENOUS
  Filled 2021-07-15 (×2): qty 250

## 2021-07-15 MED ORDER — WHITE PETROLATUM EX OINT
TOPICAL_OINTMENT | CUTANEOUS | Status: DC | PRN
  Filled 2021-07-15: qty 28.35

## 2021-07-15 NOTE — Progress Notes (Signed)
Physical Therapy Discharge ?Patient Details ?Name: Linda Cervantes ?MRN: [id number] ?DOB: [birth year] ?Today's Date: 07/15/2021 ?Time:  -  ?  ? ?Patient discharged from PT services secondary to  discussion with NSG.  Pt not medically appropriate at this time . ? ?Please see latest therapy progress note for current level of functioning and progress toward goals.   ? ?Progress and discharge plan discussed with patient and/or caregiver: Patient unable to participate in discharge planning and no caregivers available. ? ?Please reconsult should pt become medically stable enough to participate in therapy. ? ?GP ?   ?Axxel Gude A. Jayshun Galentine, PT, DPT ?Acute Rehabilitation Services ?Office: [phone]  ?[place] ?07/15/2021, 11:30 AM  ?

## 2021-07-15 NOTE — Progress Notes (Signed)
Pharmacy Antibiotic Note ? ?Linda Cervantes is a 56 y.o. female admitted on 07/24/2021 with MRSA bacteremia and epidural abscess. Pharmacy has been consulted for Vancomycin dosing. ? ?Linda Cervantes is currently comfort care and family has requested that antibiotic be continued. Vancomycin resumed on 4/14. ? ?Plan: ?- Adjust dose to Vancomycin 1250 mg IV q24h (eAUC 491, Scr 0.8) ?- Will continue to follow goals of care and levels when appropriate  ? ?Height: '5\' 3"'$  (160 cm) ?Weight: 58.7 kg (129 lb 6.6 oz) ?IBW/kg (Calculated) : 52.4 ? ?Temp (24hrs), Avg:99 ?F (37.2 ?C), Min:97.6 ?F (36.4 ?C), Max:100.2 ?F (37.9 ?C) ? ?Recent Labs  ?Lab 07/18/2021 ?6599 07/29/2021 ?1137 07/11/21 ?0601 07/11/21 ?1230 07/12/21 ?3570 07/12/21 ?0945 07/13/21 ?1779 07/14/21 ?0612  ?WBC 12.8*  --  13.0*  --  15.4*  --  16.5* 14.8*  ?CREATININE 0.55  --  0.37*  --  0.45  --  0.34* 0.39*  ?[place]  --   --   --   --   --  5*  --   --   ?VANCOPEAK  --  17*  --  69  --   --   --   --   ? ?  ?Estimated Creatinine Clearance: 67.3 mL/min (A) (by C-G formula based on SCr of 0.39 mg/dL (L)).   ? ?Allergies  ?Allergen Reactions  ? Amitriptyline Hcl Swelling and Other (See Comments)  ?  Face Swelling  ? Tylenol [Acetaminophen] Other (See Comments)  ?  Reaction not recalled by [name]  ? ? ?Antimicrobials this admission: ?Cefepime 4/7 >> 4/8 ?Vancomycin 4/7 >> ? ?Dose adjustments this admission: ?n/a ? ?Microbiology results: ?4/7 BCx >> 4/4 MRSA ?4/7 Epidural abscess >> MRSA ?4/9 BCx >> 1/3 presumably MRSA ?4/10 BCx >> ng<12h ?4/10 tissue cx >> MRSA ? ?Thank you for allowing pharmacy to be a part of this patient?s care. ? ?Lestine Box, PharmD ?PGY2 Infectious Diseases Pharmacy Resident ?  ?Please check [email or web address] for unit-specific pharmacy phone numbers  ?
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
## 2021-07-15 NOTE — Progress Notes (Signed)
Pt's respiratory rate is currently 30. PRN order available to give '2mg'$  IV bolus for respirations greater than 25. Provider made aware. Medication given. Will continue to monitor.  ?

## 2021-07-15 NOTE — Progress Notes (Incomplete)
Pt's son has requested to speak with the MD ?

## 2021-07-15 NOTE — Consult Note (Signed)
?Consultation Note ?Date: 07/15/2021  ? ?Patient Name: Linda Cervantes  ?DOB: Oct 17, 1965  MRN: [id number]  Age / Sex: 56 y.o., female  ?PCP: Vevelyn Francois, NP ?Referring Physician: Roxan Hockey, MD ? ?Reason for Consultation: Establishing goals of care ? ?HPI/Patient Profile: 56 y.o. female  with past medical history of  IV drug use, substance abuse, paranoid schizophrenia, depression, asthma, anxiety, and back pain admitted on 07/14/2021 with  mid to low back pain with associated difficulty urinating and possible lower extremity weakness. MRI spine on admission revealed epidural abscess extending from T12-L2 measuring 80m at the thickest with severe spinal cord stenosis at L3-L4 and L4-L5. 4/6 went to OR for emergency cervical and thoracic laminectomy. On 4/10 she developed triplegia and had MRI spine which showed cervical epidural abscess. Underwent emergent OR trip for hemilaminectomy. On 4/14 decision was made to move forward with one way extubation and comfort measures. Family with some concerns regarding symptom management and comfort care. Palliative medicine consulted to discuss [place]  ? ?Clinical Assessment and Goals of Care: ?I have reviewed medical records including EPIC notes, labs and imaging, received report from RN, assessed the patient and then met with patient and son  to discuss diagnosis prognosis, GOC, EOL wishes, disposition and options. ? ?Met at patient's bedside multiple times today.  ? ?During our initial discussions son had concerns that they did not understand what they had agreed to when they agreed to comfort care.  He expressed that they expected that patient would die quickly following extubation however that has not happened now they are questioning their decision for comfort care and wondering if they should return to aggressive measures.  I explained to him that patient still appeared to be in the dying process as she was quite frail  and weak.  She is unable to manage her secretions.  She is also experiencing labored breathing.  I explained that patient is young and though she is very sick sometimes the dying process takes time. ? ?I detailed for them and aggressive care path and what that would look like.  We discussed moving back to the ICU likely requiring reintubation and trach.  We reviewed what had previously been told to them by the neurosurgeon about patient's lower extremities.  We discussed that she would likely be wheelchair-bound and require nursing facility care.  I asked her how she feels about this things and she tells me no, she does not want to live that way. ? ?We detailed a comfort path and discussed measures which we could use to ensure she is comfortable and not suffering.  We discussed this would lead to the end of her life.  She agrees and tells me and her son both she would like to focus on her comfort.  Son agrees with this as well. ? ?Throughout the day and during multiple visits there were multiple symptom management concerns as patient's pain, anxiety, agitation, shortness of breath, and secretions were uncontrolled.  We escalated the morphine infusion throughout the day and also provided multiple boluses of morphine.  We reviewed patient's medical history and use of opiates in the past therefore likely high tolerance.  We also utilized Ativan and Haldol pushes.  Patient also complaining of nasal discomfort and we tried some ointment but also talked how oxygen dries her out and can add to discomfort-we ultimately removed the oxygen.  Added nasal saline spray.  Patient received multiple doses of Robinul and we also applied a scopolamine patch for her  secretions.  Throughout the day patient continued to complain of difficulty with secretions and so we discussed the option of nasotracheal suctioning.  I explained that it would be temporarily uncomfortable but could provide some relief.  She immediately agreed and we  proceeded with suctioning and retrieved large amounts of secretions.  She experienced immediate relief.  Placed as needed order for nasotracheal suctioning.  Patient also requesting liquids and soft diet, son understands risks but wants to focus on her comfort so we proceeded with allowing her comfort feeds.  Lots of education provided surrounding all of the medications that were given. ? ?Later in the day patient's significant other Marjory Lies arrived and expressed his desire that we help patient get better.  He shared about his faith and how he believes the miracle happened.  We discussed this in detail and reviewed the above.  Reviewed patient's wishes and goals.  We discussed that we can continue to focus on her comfort to honor her wishes as well as hope for miracle.  He was agreeable to this plan ? ?Questions and concerns were addressed. The family was encouraged to call with questions or concerns.  ? ? ?Primary Decision Maker ?PATIENT ? Next of kin son Katheran Awe when patient unable ? ?SUMMARY OF RECOMMENDATIONS   ?-Multiple symptom management needs throughout day as described above: Continue morphine infusion with as needed boluses, continue as needed Ativan and Haldol, as needed nasotracheal suction per patient's request, discontinue oxygen, continue scopolamine patch, continue as needed Robinul ?-Comfort measures only ?-Son does request that IV antibiotics are continued for now ? ?Code Status/Advance Care Planning: ?DNR ? ?Additional Recommendations (Limitations, Scope, Preferences): ?Full Comfort Care ? ?Prognosis:  ?Hours - Days ? ?Discharge Planning: Anticipated Hospital Death  ? ?  ? ?Primary Diagnoses: ?Present on Admission: ? Epidural abscess ? Sepsis ([place]) ? Acute encephalopathy ? Hypokalemia ? Acute hyponatremia ? Polysubstance abuse ([place]) ? Abscess in epidural space of thoracic spine ? Abscess in epidural space of cervical spine ? ? ?I have reviewed the medical record, interviewed the patient and family,  and examined the patient. The following aspects are pertinent. ? ?Past Medical History:  ?Diagnosis Date  ? Anxiety   ? Asthma   ? Back pain   ? Depression   ? Endometriosis   ? GERD (gastroesophageal reflux disease)   ? Heart murmur   ? 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 Paranoid schizophrenia ([place])   ? Substance abuse ([place]) Clean as of 2009  ? Crack cocaine  ? ?Social History  ? ?Socioeconomic History  ? Marital status: Legally Separated  ?  Spouse name: Not on file  ? Number of children: Not on file  ? Years of education: Not on file  ? Highest education level: Not on file  ?Occupational History  ? Not on file  ?Tobacco Use  ? Smoking status: Every Day  ?  Packs/day: 1.00  ?  Years: 40.00  ?  Pack years: 40.00  ?  Types: Cigarettes  ?  Passive exposure: Current  ? Smokeless tobacco: Never  ?Vaping Use  ? Vaping Use: Never used  ?Substance and Sexual Activity  ? Alcohol use: No  ? Drug use: Not Currently  ?  Types: Cocaine, Heroin, IV  ?  Comment: 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  ? Sexual activity: Not Currently  ?Other Topics Concern  ? Not on file  ?Social History Narrative  ? Not on file  ? ?Social Determinants of  Health  ? ?Financial Resource Strain: Medium Risk  ? Difficulty of Paying Living Expenses: Somewhat hard  ?Food Insecurity: No Food Insecurity  ? Worried About Charity fundraiser in the Last Year: Never true  ? Ran Out of Food in the Last Year: Never true  ?Transportation Needs: No Transportation Needs  ? Lack of Transportation (Medical): No  ? Lack of Transportation (Non-Medical): No  ?Physical Activity: Sufficiently Active  ? Days of Exercise per Week: 7 days  ? Minutes of Exercise per Session: 30 min  ?Stress: Stress Concern Present  ? Feeling of Stress : Very much  ?Social Connections: Socially Isolated  ? Frequency of Communication with Friends and Family: Once a week  ? Frequency of Social Gatherings with Friends and Family: Never  ? Attends Religious Services:  Never  ? Active Member of Clubs or Organizations: No  ? Attends Archivist Meetings: Never  ? Marital Status: Separated  ? ?Family History  ?Problem Relation Age of Onset  ? Hypertension Father

## 2021-07-16 DIAGNOSIS — F1910 Other psychoactive substance abuse, uncomplicated: Secondary | ICD-10-CM | POA: Diagnosis not present

## 2021-07-16 DIAGNOSIS — G061 Intraspinal abscess and granuloma: Secondary | ICD-10-CM | POA: Diagnosis not present

## 2021-07-16 DIAGNOSIS — G062 Extradural and subdural abscess, unspecified: Secondary | ICD-10-CM | POA: Diagnosis not present

## 2021-07-16 DIAGNOSIS — M549 Dorsalgia, unspecified: Secondary | ICD-10-CM | POA: Diagnosis not present

## 2021-07-16 NOTE — Progress Notes (Signed)
Update given to son, Ysidro Evert, at bedside. Agreeable to current plan of care, patient resting comfortably at this time, continues on Morphine infusion. ?

## 2021-07-16 NOTE — Progress Notes (Signed)
Son and his significant other at bedside, requesting if morphine could be decreased. This nurse explained purpose of morphine and keeping patient comfortable and he verbalized understanding. Patient currently resting with no signs of pain or distress. Son declined need for Ativan at this time. Some secretions and oral suction was performed, PRN Rubinol given. This nurse decreased dose of morphine to 15 mg/hr.  ?

## 2021-07-16 NOTE — Progress Notes (Signed)
Morphine increased back to 20 mg/hr due to patient becoming more restless and increased respiratory rate. Son comes to bedside as this nurse was leaving the room. This nurse explained the reason for increasing the dose and son verbalized understanding and agreeable to increase.  ?

## 2021-07-16 NOTE — Progress Notes (Signed)
? ? ? [facility] ?                                                                            ? ? ?Linda Cervantes, is a 56 y.o. female, DOB - Nov 05, 1965, JIR:[id number] ?Admit date - 07/30/2021    ?Outpatient Primary MD for the patient is Vevelyn Francois, NP ? ?LOS - 9  days ? ? ? ?Brief summary  ? ?Patient is a 56 y.o. female with a PMH significant for IV drug use, substance abuse, paranoid schizophrenia, depression, asthma, anxiety, and back pain who presented to [facility] ED for complaints of new onset mid to low back pain with associated difficulty urinating and possible lower extremity weakness.   ?MRI spine on admission revealed epidural abscess extending from T12-L2 measuring 109m at the thickest with severe spinal cord stenosis at L3-L4 and L4-L5. [facility]   ?On arrival to [facility] attempts were made to obtain MRI of thoracic spine but patient did no tolerate flat position and quick displayed signs of respiratory distress. MRI was canceled and PCCM was consulted for possible need of airway support.  ?4/6 admitted for acute back pain, inability to urinate and possible lower extremity weakness. Found to have large epidural abscess ?4/6 to OR for emergency cervical and thoracic laminectomy ?4/10 developed triplegia. Had MRI spine which showed cervical epidural abscess. Emergent OR trip for hemilaminectomy. ?4/12 family meeting with son and significant other at bedside.   ?4/14: Postextubation, patient was placed on [place] O2 and started desatting.  GRantoulmeeting and comfort care orders were placed per family's wishes.  Transferred to palliative floor. ?4/15: [facility] assumed care, palliative medicine consulted ? ? ?Assessment & Plan  ? ? ?Assessment and Plan: ?*Follow spinal epidural MRSA abscess, Sepsis, POA ? 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  2D echo was negative for  vegetation and family had refused TEE. 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  Palliative medicine was consulted. ?-Continue comfort care status, on morphine infusion ?-Son has requested to continue antibiotics for now ? ?Acute hyponatremia, hypokalemia ?-Continue comfort care status ? ?Acute encephalopathy ?-Continue comfort care status ? ?Polysubstance abuse ([place] ?Blood alcohol negative ?UDS positive for cocaine. ?History of IVDA. ?-Continue comfort care status ? ?Microcytic anemia ?Hemoglobin stable and at baseline on 4/14 ? ?Acute urinary retention ?-Foley catheter was placed ? ?Leukocytosis ?Secondary to sepsis ?-Now comfort care status ? ?Code Status: DNR/DNI ?DVT Prophylaxis:   ? ? ?Level of Care: Level of care: Palliative Care ?Family Communication:  ?Disposition Plan:     Remains inpatient appropriate: On morphine infusion, anticipate hospital death ? ?Procedures:  ? ? ?Consultants:   ?PCCM ?Neurosurgery ? ?Antimicrobials:  ?IV vancomycin   07/12/2021--- ?IV cefepime      07/18/2021--- ? ? ?Medications ? ? chlorhexidine gluconate (MEDLINE KIT)  15 mL Mouth  Rinse BID  ? mouth rinse  15 mL Mouth Rinse 10 times per day  ? scopolamine  1 patch Transdermal Q72H  ? ? ? ?Subjective:  ? ?Linda Cervantes was seen and examined today.   Somnolent, not following any verbal commands, on morphine infusion.  Appears to be comfortable ? ?Objective:  ? ?Vitals:  ? 07/15/21 0929 07/15/21 0939 07/15/21 2132 07/16/21 0617  ?BP:    140/75  ?Pulse:   79 86  ?Resp: (!) 30 (!) 28 (!) 22 14  ?Temp:   98.3 ?F (36.8 ?C) 99.8 ?F (37.7 ?C)  ?TempSrc:   Axillary Oral  ?SpO2:   (!) 83% (!) 78%  ?Weight:      ?Height:       ? ? ?Intake/Output Summary (Last 24 hours) at 07/16/2021 1254 ?Last data filed at 07/16/2021 0617 ?Gross per 24 hour  ?Intake 711.92 ml  ?Output 700 ml  ?Net 11.92 ml  ? ?Filed Weights  ? 07/04/2021 0341 07/12/21 0500 07/13/21 0334  ?Weight: 57.2 kg 58.7 kg 58.7 kg  ? ? ? ?Exam ?General: Somnolent, not following any verbal commands, comfortable ?Cardiovascular: S1 S2 auscultated, RRR ?Respiratory: Scattered rhonchi bilaterally ?Gastrointestinal: Soft, nontender, nondistended, + bowel sounds ?Ext: no pedal edema bilaterally ?Psych: somnolent ? ? ?Data Reviewed:  I have personally reviewed following labs  ? ? ?CBC ?Lab Results  ?Component Value Date  ? WBC 14.8 (H) 07/14/2021  ? RBC 4.05 07/14/2021  ? HGB 10.5 (L) 07/14/2021  ? HCT 35.3 (L) 07/14/2021  ? MCV 87.2 07/14/2021  ? MCH 25.9 (L) 07/14/2021  ? PLT 228 07/14/2021  ? MCHC 29.7 (L) 07/14/2021  ? RDW 16.3 (H) 07/14/2021  ? LYMPHSABS 1.6 07/14/2021  ? MONOABS 1.4 (H) 07/14/2021  ? EOSABS 0.1 07/14/2021  ? BASOSABS 0.1 07/14/2021  ? ? ? ?Last metabolic panel ?Lab Results  ?Component Value Date  ? NA 137 07/14/2021  ? K 3.8 07/14/2021  ? CL 98 07/14/2021  ? CO2 34 (H) 07/14/2021  ? BUN 13 07/14/2021  ? CREATININE 0.39 (L) 07/14/2021  ? GLUCOSE 148 (H) 07/14/2021  ? GFRNONAA >60 07/14/2021  ? GFRAA 86 02/04/2018  ? CALCIUM 8.1 (L) 07/14/2021  ? PHOS 3.1 07/11/2021  ? PROT 6.0 (L) 07/14/2021  ? ALBUMIN 1.6 (L) 07/14/2021  ? LABGLOB 3.0 04/24/2021  ? AGRATIO 1.4 04/24/2021  ? BILITOT 0.3 07/14/2021  ? ALKPHOS 56 07/14/2021  ? AST 34 07/14/2021  ? ALT 27 07/14/2021  ? ANIONGAP 5 07/14/2021  ? ? ?CBG (last 3)  ?Recent Labs  ?  07/14/21 ?4098 07/14/21 ?0750 07/14/21 ?1117  ?GLUCAP 131* 150* 108*  ?  ? ? ? ? ? ?Estill Cotta M.D. ?[facility] ?07/16/2021, 12:54 PM ? ?Available via Epic secure chat 7am-7pm ?After 7 pm, please refer to night coverage provider listed on amion. ? ?  ?

## 2021-07-16 NOTE — Progress Notes (Signed)
After decreasing Morphine infusion to 15 mg/hr, patient became more restless. Morphine bolus given but not effective, PRN Ativan given and patient currently resting comfortably. Ysidro Evert and significant other at bedside at this time.  ?

## 2021-07-16 NOTE — Progress Notes (Signed)
?                                                   ?Palliative Care Progress Note, Assessment & Plan  ? ?Patient Name: Linda Cervantes       Date: 07/16/2021 ?DOB: 11/15/1965  Age: 56 y.o. MRN#: [id number] ?Attending Physician: Mendel Corning, MD ?Primary Care Physician: Vevelyn Francois, NP ?Admit Date: 07/20/2021 ? ?Reason for Consultation/Follow-up: Establishing goals of care ? ?Subjective: ?Patient is lying in bed in no apparent distress.  She does not acknowledge my presence and is not able to make her wishes known.  Her son Ysidro Evert and significant other [place] at bedside. ? ?HPI: ?Patient is a 56 year old female with a past medical history significant for IV drug abuse, substance abuse, paranoid schizophrenia, depression, anxiety, asthma, and back pain.  Patient was admitted on 4/6 with low back pain associated with difficulty urinating and lower extremity weakness. ? ?4/6: MRI spine on admission revealed epidural abscess extending from T12-L2 measuring 54m at the thickest with severe spinal cord stenosis at L3-L4 and L4-L5.  ?She went to OR for emergency cervical and thoracic laminectomy.  ? ?4/10 she developed triplegia and had MRI spine which showed cervical epidural abscess. She then went emergently to OR for hemilaminectomy. ? ?4/14 decision was made to move forward with one way extubation and comfort measures.  ? ?Summary of counseling/coordination of care: ?After reviewing the patient's chart and assessing the patient at bedside, I spoke with patient's son JYsidro Evertand his significant other [place]  JYsidro Evertasked appropriate questions regarding palliative medicine.  JYsidro Evertis still holding out hope and hoping a miracle occurs for his mother to be able to get up and walk out of this hospital.   ? ?However, he also says he is realistic and knows that she is delining.  We  discussed that palliative medicine will continue to follow the patient whether or not she improves or declines.  He asked about antibiotics and whether or not they would clear the infection that she has.  I shared that antibiotics are broad-spectrum and targeting her infection.  However, she has several other factors contributing to her decline such as her respiratory status and functional ability.  JYsidro Evertagain stated he knows that she is not doing well but wants her to get better. ? ?Reviewed in detail that comfort care involves focusing on the patient and not on numbers, lab work, vital signs, or other aggressive medical interventions.  JYsidro Evertcontinues to be in agreement with ensuring his mother is kept comfortable.   ? ?We discussed nonverbal signs of pain such as grimacing of the face, furrowing of the brow, and wincing. I shared that I did not see any signs of pain, agitation, discomfort, difficulty breathing, or other symptoms of distress at this time. Plan continues and current regimen of Morphine gtt with PRNs appears to be managing the patient's symptoms. JYsidro Evertwas in agreement and appreciative of NP Shae Shaffer's help yesterday, 4/15.  ? ?Questions and concerns were addressed.  PMT will continue to follow the patient throughout her hospitalization. ? ?Consulted with nursing who is also in full understanding that patient is comfort measures only. ? ?Code Status: ?DNR ? ?Prognosis: ?Hours - Days ? ?Discharge Planning: ?Anticipated Hospital Death ? ?Recommendations/Plan: ?Continue morphine gtt with PRN boluses from bag ?Continue  aggressive secretion management with robinol, scopalomine patch, and suctioning PRN ?Continue education with family regarding expectations at end of life ? ?Care plan was discussed with patient, patient's son Ysidro Evert and his [place], nursing ? ?Physical Exam ?Vitals and nursing note reviewed.  ?Constitutional:   ?   Appearance: She is not ill-appearing or toxic-appearing.   ?HENT:  ?   Head: Normocephalic.  ?   Mouth/Throat:  ?   Mouth: Mucous membranes are moist.  ?Cardiovascular:  ?   Rate and Rhythm: Normal rate.  ?   Pulses: Normal pulses.  ?Pulmonary:  ?   Effort: Pulmonary effort is normal.  ?   Breath sounds: No wheezing.  ?Abdominal:  ?   Palpations: Abdomen is soft.  ?Skin: ?   General: Skin is warm.  ?   Comments: +1 non pitting edema of bilateral UEs  ?Neurological:  ?   Comments: nonverbal  ?         ? ?Palliative Assessment/Data: 10% ? ? ? ?Total Time 35 minutes  ?Greater than 50%  of this time was spent counseling and coordinating care related to the above assessment and plan. ? ?Thank you for allowing the Palliative Medicine Team to assist in the care of this patient. ? ?Verdell Carmine. Braxson Hollingsworth, DNP, FNP-BC ?Palliative Medicine Team ?Team Phone # [phone] ?  ?

## 2021-07-31 NOTE — Death Summary Note (Addendum)
? ?DEATH SUMMARY  ? ?Patient Details  ?Name: Linda Cervantes ?MRN: [id number] ?DOB: 25-Jul-1965 ?IRW:ERXV, Diona Foley, NP ?Admission/Discharge Information  ? ?Admit Date:  Jul 23, 2021  ?Date of Death: Date of Death: 08/03/21  ?Time of Death: Time of Death: 0623  ?Length of Stay: 10  ? ?Principle Cause of death: Epidural abscess  ? ?Hospital Diagnoses: ? ?  Epidural MRSA abscess ?  Sepsis ([place]), POA  ?  MRSA BACTEREMIA  ?  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  Acute encephalopathy ?  Acute hyponatremia ?  Abscess in epidural space of thoracic spine ?  Polysubstance abuse ([place]) ?  Hypokalemia ?  Microcytic anemia ?  Acute urinary retention ?  Leukocytosis ?   ? ? ?Hospital Course: ?Patient is a 56 y.o. female with a PMH significant for IV drug use, substance abuse, paranoid schizophrenia, depression, asthma, anxiety, and back pain who presented to [facility] ED for complaints of new onset mid to low back pain with associated difficulty urinating and possible lower extremity weakness.   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 [facility]   ?On arrival to [facility] attempts were made to obtain MRI of thoracic spine but patient did no tolerate flat position and quick displayed signs of respiratory distress. MRI was canceled and PCCM was consulted for possible need of airway support.  ?4April 23, 2024admitted for acute back pain, inability to urinate and possible lower extremity weakness. Found to have large epidural abscess ?404/23/24to OR for emergency cervical and thoracic laminectomy ?4/10 developed triplegia. Had MRI spine which showed cervical epidural abscess. Emergent OR trip for hemilaminectomy. ?4/12 family meeting with son and significant other at bedside.   ?4/14: Postextubation, patient was  placed on [place] O2 and started desatting.  [place] and comfort care orders were placed per family's wishes.  Transferred to palliative floor. ? ?Assessment and Plan: ? ?spinal epidural MRSA abscess, Sepsis, POA ? 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  2D echo was negative for  vegetation and family had refused TEE. 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  Palliative medicine was consulted. ?-Patient was placed on comfort care status, on morphine infusion for pain control and EOL symptomatic management per family's wishes.  ? ?  ?Acute hyponatremia, hypokalemia ?-comfort care status ?  ?Acute encephalopathy ?- comfort care status ?  ?Polysubstance abuse [place] ?Blood alcohol negative ?UDS positive for cocaine. ?History of IVDA. ?- comfort care status ?  ?Microcytic anemia ?Hemoglobin stable and at baseline on 4/14 ?  ?Acute urinary retention ?-Foley catheter was placed ?  ?Leukocytosis ?Secondary to sepsis ?-comfort care status ? ?Patient passed on 4May 04, 2023at 6:23am.  ? ? ?Procedures:  ? ?Consultations: PCCM ?Neurosurgery  ?Palliative medicine  ? ?The results of significant diagnostics from this hospitalization (including imaging, microbiology, ancillary and laboratory) are listed below for reference.  ? ?Significant Diagnostic Studies: ?DG Cervical Spine 1 View ? ?Result Date: 07/01/2021 ?CLINICAL DATA:  Surgical localization. EXAM: DG CERVICAL SPINE - 1 VIEW COMPARISON:  MRI  of same day. FINDINGS: Single intraoperative cross-table lateral projection was obtained of the cervical spine. This demonstrates a surgical probe directed toward the posterior portion of C2-3 level.  IMPRESSION: Surgical localization as described above. Electronically Signed   By: Marijo Conception M.D.   On: 07/19/2021 18:15  ? ?DG Abd 1 View ? ?Result Date: 07/15/2021 ?CLINICAL DATA:  NG tube placement EXAM: ABDOMEN - 1 VIEW COMPARISON:  Earlier same day FINDINGS: NG tube is positioned with the tip in the distal stomach at the pylorus. Gaseous bowel distension noted over the visualized upper abdomen. IMPRESSION: NG tube tip is in the distal stomach near the pylorus. Electronically Signed   By: Misty Stanley M.D.   On: 07/11/2021 15:12  ? ?DG Abd 1 View ? ?Result Date: 07/05/2021 ?CLINICAL DATA:  Check gastric catheter placement EXAM: ABDOMEN - 1 VIEW COMPARISON:  None. FINDINGS: Gastric catheter has been placed with the tip at the gastroesophageal junction. This should be advanced further into the stomach. New right basilar consolidation is noted. IMPRESSION: Gastric catheter within the distal esophagus. This should be advanced deeper into the stomach. Electronically Signed   By: Inez Catalina M.D.   On: 07/13/2021 00:42  ? ?CT HEAD WO CONTRAST (5MM) ? ?Result Date: 07/15/2021 ?CLINICAL DATA:  Head trauma, focal neuro findings (Age 61-64y) EXAM: CT HEAD WITHOUT CONTRAST TECHNIQUE: Contiguous axial images were obtained from the base of the skull through the vertex without intravenous contrast. RADIATION DOSE REDUCTION: This exam was performed according to the departmental dose-optimization program which includes automated exposure control, adjustment of the mA and/or kV according to patient size and/or use of iterative reconstruction technique. COMPARISON:  Remote head CT 02/22/2009 FINDINGS: Brain: Patient had difficulty tolerating the exam, despite multiple repeat acquisitions, there is moderate motion artifact limiting assessment. Allowing for motion limitations, no evidence of hemorrhage. No large subdural or extra-axial collection. No hydrocephalus. Prominent perivascular space in the left basal ganglia. No  evidence of acute ischemia. No midline shift. Vascular: No hyperdense vessel. Skull: Assessment of the vertex is limited due to motion. Allowing for this, no calvarial abnormality is seen. Sinuses/Orbits: Leftward gaze, likely incidental. No acute findings. Other: None. IMPRESSION: Motion limited exam. Allowing for motion limitations, no evidence of acute traumatic injury or acute findings. Electronically Signed   By: Keith Rake M.D.   On: 07/29/2021 22:15  ? ?MR BRAIN WO CONTRAST ? ?Result Date: 07/24/2021 ?CLINICAL DATA:  Mental status change, unknown cause EXAM: MRI HEAD WITHOUT CONTRAST TECHNIQUE: Multiplanar, multiecho pulse sequences of the brain and surrounding structures were obtained without intravenous contrast. COMPARISON:  No prior MRI, correlation is made with CT head 07/04/2021 FINDINGS: Brain: No restricted diffusion to suggest acute or subacute infarct. No acute hemorrhage, mass, mass effect, or midline shift. No hydrocephalus or extra-axial collection. Scattered T2 hyperintense signal in the periventricular white matter, likely the sequela of mild chronic small vessel ischemic disease. Vascular: Normal flow voids. Skull and upper cervical spine: Normal marrow signal. Sinuses/Orbits: Mucosal thickening in the anterior right ethmoid air cells. Otherwise negative. Other: Fluid in the left-greater-than-right mastoid air cells. IMPRESSION: No acute intracranial process. Electronically Signed   By: Merilyn Baba M.D.   On: 07/16/2021 19:51  ? ?MR CERVICAL SPINE W WO CONTRAST ? ?Result Date: 07/02/2021 ?CLINICAL DATA:  Epidural abscess. Status post extensive thoracic laminotomies for epidural abscess evacuation on 07/05/2021. EXAM: MRI CERVICAL SPINE WITHOUT AND WITH CONTRAST TECHNIQUE: Multiplanar and multiecho pulse sequences of the cervical spine, to include  the craniocervical junction and cervicothoracic junction, were obtained without and with intravenous contrast. CONTRAST:  5.52m GADAVIST  GADOBUTROL 1 MMOL/ML IV SOLN COMPARISON:  Thoracic spine MRI 07/12/2021 FINDINGS: Alignment: Trace retrolisthesis of C5 on C6. Vertebrae: No fracture, suspicious marrow lesion, or evidence of discitis. Partially visualized

## 2021-07-31 NOTE — Progress Notes (Signed)
RN called medical examiners and verified that this patient is not a medical examiner's case.  ?

## 2021-07-31 NOTE — Progress Notes (Signed)
Patient expired at [phone]. Verified by two nurses. This RN attempted multiple to contact patient's son,Jeremy, and left a voicemail. Also called patient's boyfriend Marjory Lies and was not able to leave a voicemail due to voicemail being full.  ?

## 2021-07-31 DEATH — deceased

## 2021-08-08 ENCOUNTER — Encounter (HOSPITAL_COMMUNITY): Payer: 59 | Admitting: Physician Assistant

## 2021-10-26 ENCOUNTER — Ambulatory Visit: Payer: Self-pay | Admitting: Nurse Practitioner
